# Patient Record
Sex: Female | Born: 1990
Health system: Southern US, Community
[De-identification: ages and names within clinical notes are randomized; demographics above are authoritative.]

## PROBLEM LIST (undated history)

## (undated) DIAGNOSIS — M549 Dorsalgia, unspecified: Secondary | ICD-10-CM

## (undated) DIAGNOSIS — J302 Other seasonal allergic rhinitis: Secondary | ICD-10-CM

## (undated) DIAGNOSIS — G4730 Sleep apnea, unspecified: Secondary | ICD-10-CM

## (undated) DIAGNOSIS — L309 Dermatitis, unspecified: Secondary | ICD-10-CM

## (undated) HISTORY — PX: NO PAST SURGERIES: SHX2092

---
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
## 2011-12-22 ENCOUNTER — Encounter (HOSPITAL_BASED_OUTPATIENT_CLINIC_OR_DEPARTMENT_OTHER): Payer: Self-pay | Admitting: *Deleted

## 2011-12-22 ENCOUNTER — Emergency Department (HOSPITAL_BASED_OUTPATIENT_CLINIC_OR_DEPARTMENT_OTHER)
Admission: EM | Admit: 2011-12-22 | Discharge: 2011-12-22 | Disposition: A | Discharge status: Home / Self Care | Payer: Self-pay | Attending: Emergency Medicine | Admitting: Emergency Medicine

## 2011-12-22 DIAGNOSIS — Z87891 Personal history of nicotine dependence: Secondary | ICD-10-CM | POA: Insufficient documentation

## 2011-12-22 DIAGNOSIS — Z79899 Other long term (current) drug therapy: Secondary | ICD-10-CM | POA: Insufficient documentation

## 2011-12-22 DIAGNOSIS — L03032 Cellulitis of left toe: Secondary | ICD-10-CM

## 2011-12-22 DIAGNOSIS — L03039 Cellulitis of unspecified toe: Secondary | ICD-10-CM | POA: Insufficient documentation

## 2011-12-22 HISTORY — DX: Other seasonal allergic rhinitis: J30.2

## 2011-12-22 HISTORY — DX: Dermatitis, unspecified: L30.9

## 2011-12-22 MED ORDER — LIDOCAINE HCL (PF) 1 % IJ SOLN
INTRAMUSCULAR | Status: AC
  Filled 2011-12-22: qty 5

## 2011-12-22 NOTE — ED Notes (Signed)
Pt has an ingrown toenail on her left great toe. Now c/o pain to same for about 3 weeks.

## 2011-12-22 NOTE — ED Notes (Signed)
I completed wound care per Dr. Lilli Few request. I cleaned off iodine on foot, then applied bacitracin, then wrap with kerlix and coban. Marland Kitchen

## 2011-12-22 NOTE — ED Provider Notes (Signed)
History   This chart was scribed for Charles B. Karle Starch, MD by Kathreen Cornfield, ED Scribe. The patient was seen in room MH08/MH08 and the patient's care was started at 10:24PM.     CSN: [id number]  Arrival date & time 12/22/11  2117   First MD Initiated Contact with Patient 12/22/11 2224      Chief Complaint  Patient presents with  . Toe Pain    (Consider location/radiation/quality/duration/timing/severity/associated sxs/prior treatment) Patient is a 21 y.o. female presenting with toe pain. The history is provided by the patient. No language interpreter was used.  Toe Pain    Anita Hunter is a 21 y.o. female , who presents to the Emergency Department complaining of sudden, progressively worsening, toe pain located at the left great toe, onset three weeks ago. The pt reports she has an ingrown toe nail located at her left great toe. The pt informs she works at [employer], where she stands on her feet for prolonged periods of time, and frequently runs into objects, directly impacting upon her toes. The pt has a hx of eczema and seasonal allergies.   The pt denies any allergies to medications.   The pt does not smoke or drink alcohol.   PCP is Dr. Ernestina Patches.   Past Medical History  Diagnosis Date  . Eczema   . Seasonal allergies     History reviewed. No pertinent past surgical history.  History reviewed. No pertinent family history.  History  Substance Use Topics  . Smoking status: Former Research scientist (life sciences)  . Smokeless tobacco: Not on file  . Alcohol Use: No    OB History    Grav Para Term Preterm Abortions TAB SAB Ect Mult Living                  Review of Systems  All other systems reviewed and are negative.    Allergies  Review of patient's allergies indicates no known allergies.  Home Medications   Current Outpatient Rx  Name  Route  Sig  Dispense  Refill  . CETIRIZINE HCL 10 MG PO TABS   Oral   Take 10 mg by mouth daily.           Marland Kitchen FLUTICASONE PROPIONATE 50  MCG/ACT NA SUSP   Nasal   2 sprays by Nasal route daily. Each nostril.          Marland Kitchen KETOTIFEN FUMARATE 0.025 % OP SOLN   Both Eyes   Place 1 drop into both eyes 2 (two) times daily as needed. For itching          . MOMETASONE FUROATE 0.1 % EX OINT      Apply to affected areas once daily.          . TRIAMCINOLONE ACETONIDE 0.1 % EX OINT      Apply to affected area two times a day as needed.            BP 132/74  Pulse 96  Temp 97.8 F (36.6 C) (Oral)  Resp 16  Ht 5' 8"  (1.727 m)  Wt 290 lb (131.543 kg)  BMI 44.09 kg/m2  SpO2 100%  LMP 11/26/2011  Physical Exam  Nursing note and vitals reviewed. Constitutional: She is oriented to person, place, and time. She appears well-developed and well-nourished.  HENT:  Head: Normocephalic and atraumatic.  Neck: Neck supple.  Pulmonary/Chest: Effort normal.  Musculoskeletal:       Peritrichia detected at the left lateral first toe nailbed.  Neurological: She is alert and oriented to person, place, and time. No cranial nerve deficit.  Psychiatric: She has a normal mood and affect. Her behavior is normal.    ED Course  Procedures (including critical care time)  DIAGNOSTIC STUDIES: Oxygen Saturation is 100% on room air, normal by my interpretation.    COORDINATION OF CARE:    10:26 PM- Treatment plan concerning incision and drainage of left great toe discussed with patient. Pt agrees with treatment.   INCISION AND DRAINAGE Paronychia PROCEDURE NOTE: Patient identification was confirmed and verbal consent was obtained. This procedure was performed by Juanda Crumble B. Karle Starch, MD at 10:50 PM. Site: Left lateral 1st toe.  Anesthetic used (type and amt): lidocaine, digital block Blade size: 11 Drainage: minimal Packing used, none    Labs Reviewed - No data to display No results found.   No diagnosis found.    MDM  Incision of paronychia, no significant ingrown toenail, no need for Abx or toenail  excision     I personally performed the services described in this documentation, which was scribed in my presence. The recorded information has been reviewed and is accurate.     Charles B. Karle Starch, MD 12/22/11 2300

## 2012-06-13 ENCOUNTER — Ambulatory Visit (INDEPENDENT_AMBULATORY_CARE_PROVIDER_SITE_OTHER): Payer: Self-pay | Admitting: Family Medicine

## 2012-06-13 ENCOUNTER — Encounter: Payer: Self-pay | Admitting: Family Medicine

## 2012-06-13 DIAGNOSIS — J309 Allergic rhinitis, unspecified: Secondary | ICD-10-CM

## 2012-06-13 DIAGNOSIS — M25569 Pain in unspecified knee: Secondary | ICD-10-CM

## 2012-06-13 DIAGNOSIS — Z0000 Encounter for general adult medical examination without abnormal findings: Secondary | ICD-10-CM

## 2012-06-13 DIAGNOSIS — L2089 Other atopic dermatitis: Secondary | ICD-10-CM

## 2012-06-13 DIAGNOSIS — F17200 Nicotine dependence, unspecified, uncomplicated: Secondary | ICD-10-CM

## 2012-06-13 DIAGNOSIS — M25562 Pain in left knee: Secondary | ICD-10-CM | POA: Insufficient documentation

## 2012-06-13 MED ORDER — FLUTICASONE PROPIONATE 50 MCG/ACT NA SUSP: 2.0000 | Freq: Every day | NASAL | Status: DC

## 2012-06-13 MED ORDER — CETIRIZINE HCL 10 MG PO TABS: 10.0000 mg | ORAL_TABLET | Freq: Every day | ORAL | Status: DC

## 2012-06-13 MED ORDER — TRIAMCINOLONE ACETONIDE 0.1 % EX OINT: TOPICAL_OINTMENT | Freq: Two times a day (BID) | CUTANEOUS | Status: DC

## 2012-06-13 MED ORDER — MONTELUKAST SODIUM 10 MG PO TABS: 10.0000 mg | ORAL_TABLET | Freq: Every day | ORAL | Status: DC

## 2012-06-13 NOTE — Patient Instructions (Signed)
Thank you for coming in, today! It was nice to meet you. We talked about several things, today. For your knee pain:  Let's try conservative things first.  Try taking Aleve (naprosyn) over the counter, as directed on the package.  You can also try icing your knee (15 minutes on, 15 minutes off, for 1-2 hours at a time).  Look at the drug store to see if there are any knee braces that you can get.  If you're still having trouble in another 2-4 weeks, come back to see me.  We might consider getting some xrays or referring you to sports medicine at that time. For your allergies:  I will write you prescriptions for Flonase (which you have taken before) and a medicine called montelukast (Singulair).  The Flonase is a nasal spray that will help with inflammation.  The Singulair helps keep your body from reacting too strongly to things like pollen and dust.  You can also try adding Zyrtec or Claritin or a similar drug over the counter. For eczema:  You can continue using triamcinolone as needed.  Aveeno or Eucerin are good moisturizers to use.  After a shower, pat your skin dry gently, then put on the triamcinolone, then cover with the moisturizer cream. For weight loss and smoking:  Set specific, small, measurable goals (if you drink 7 sodas in a week, cut down to 5, then 3, then none, etc).  If you eat fast food once per week, try going to once every other week, or quit altogether.  Pick less calorie rich foods (more green leafy vegetables, fewer starchy foods, more water, etc).  1-800-QUIT-NOW is the [place] stop-smoking hotline. They can help with different strategies for stopping.  I will also send a message to one of our "health coaches" who will call you and talk about smoking. If you come back in about a month for your knee, we can talk about birth control and a few other things at that point. If you don't need to come back for your knee, make an appointment in 1-2 months for those  things. Please feel free to call with any questions or concerns at any time, at [phone]. --Dr. Venetia Maxon

## 2012-06-14 ENCOUNTER — Encounter: Payer: Self-pay | Admitting: Family Medicine

## 2012-06-14 DIAGNOSIS — F17200 Nicotine dependence, unspecified, uncomplicated: Secondary | ICD-10-CM | POA: Insufficient documentation

## 2012-06-14 DIAGNOSIS — Z0000 Encounter for general adult medical examination without abnormal findings: Secondary | ICD-10-CM | POA: Insufficient documentation

## 2012-06-14 NOTE — Progress Notes (Signed)
Subjective:    Patient ID: Anita Hunter, female    DOB: [birth year], 22 y.o.   MRN: [id number]  HPI: Pt is a 22yo female who presents to re-establish care/discuss her current issues and to discuss new knee pain. FH, PMH, SH all updated in relevant sections of the EMR.  Knee pain: Pt's left knee began bothering her about 1 month ago when it "snapped back" after being hit by a part of an elliptical machine at the gym that she was getting off of. The pain is mostly unchanged and aching in nature. Pt also has some left great toe tingling/numbness, intermittently. Pt works at [employer] and often stands on concrete floor for long periods. Pt has never been seen by sports medicine; pt's chart indicates this was considered in the past for knee problems. Pt has not tried any medications or icing.  Allergies: Pt has long-standing seasonal allergies. Pt has used several antihistamines in the past without much relief. Pt has also used Flonase in the past with little relief, but more than with antihistamines. Pt complains mostly of sneezing, stuffy nose, and snoring (which she also attributes in part to her weight). Generally denies cough, itchy eyes, runny nose, or SOB. Allergies are "hard to predict," sometimes at random times through the year, sometimes worse in the spring/fall.   Eczema: Pt's eczema flares only "occasionally," mostly on her elbows and knees. She does not think it relates definitely to her allergies or when they are worse. Pt uses triamcinolone and Aveeno after showers, which tends to help. Pt has not noticed any signs/symptoms of infection; specifically denies bleeding/drainage, redness/swelling, etc.  Obesity/weight: Pt reports trying to lose weight and has appropriate questions about healthy body weight and diet choices, etc. PT currently is working out less due to her knee pain, as above. Pt does occasionally eat fast food or "junk food" at home due to her work schedule. However, pt is  working to reduce the number of sodas she drinks, and wants to stop smoking and generally improve her diet.  No prior surgeries. Not currently taking any medications. No known drug allergies.  FH: father with migraines, HTN; maternal and paternal grandmothers with HTN, maternal GM with DM; ?FH of fibroids SH: current some-day smoker ("few cigarettes a week); occasional marijuana use, 1-2 days per week  Works at [employer], as above. Lives apart from family (mother, mother's boyfriend, younger brother, and younger sister; also has a 58yo older sister).  Sexually active in the past with both female and female partners, only one female currently, uses condoms. Interested in OCP's for birth control.  Review of Systems: As above. Otherwise, 12-system ROM was reviewed and all negative.     Objective:   Physical Exam BP 116/61  Pulse 95  Temp(Src) 99 F (37.2 C) (Oral)  Ht 5' 8"  (1.727 m)  Wt 281 lb (127.461 kg)  BMI 42.74 kg/m2 Gen: adult, obese female in NAD, pleasant and cooperative with exam HEENT: [place]/AT, EMO, PERRLA, sclerae and conjunctivae clear; TMs clear bilaterally, MMM  Acanthosis nigricans to posterior neck, no cervical lymph nodes Cardio: RRR, no murmur appreciated Pulm: CTAB, no wheezes; decreased air sounds bilaterally, ?2/2 body habitus but equal air movement and normal WOB Abd: obese, soft, nontender, BS+ MSK: left knee with mild tenderness to palpation along joint line but no crepitus on passive stressed ROM  Knees without frank effusion bilaterally, gait normal normal  ROM to cervical, thoracic, and lumbar spines, and to all four extremities Skin: few  dry, hyperpigmented areas of flexor surfaces of extremities, esp in the antecubital and popliteal fossae  Otherwise warm, dry, intact without rash     Assessment & Plan:

## 2012-06-14 NOTE — Assessment & Plan Note (Signed)
Evidence for mild eczema mostly in antecubital and popliteal fossae without frank superinfection. Rx for triamcinolone cream. Advised general skin care, moisturizers as needed, minimizing harsh soaps/detergents, etc. F/u as needed.

## 2012-06-14 NOTE — Assessment & Plan Note (Signed)
Pt interested in OCP use. Will plan to f/u in approximately 1 month for discussion specifically about birth control and will discuss different options (OCP vs Nexplanon, Mirena, etc). Advised regular condom use for now. Will also discuss routine health maintenance screening in the future, as well, on an age-appropriate basis (pap smear, STI screening, etc).

## 2012-06-14 NOTE — Assessment & Plan Note (Signed)
Current some-day smoker, interested in quitting. Pt states she is willing to set a quit date of 5/30. Counseled on cessation and benefits to various other problems, including weight loss. Provided with 1-800-QUIT-NOW number and will refer to [facility] health coaching. F/u as needed.

## 2012-06-14 NOTE — Assessment & Plan Note (Signed)
A: Little relief in the past from antihistamines, with some improvement with Flonase. No evidence for infectious sinusitis.  P: Rx for montelukast and Flonase. Suggested regular rather than intermittent use of antihistamine in addition to these two, as well. F/u as needed.

## 2012-06-14 NOTE — Assessment & Plan Note (Signed)
A: Likely secondary to minor injury at the gym about one month ago, exacerbated by weight and little directed/specific care. No evidence for joint effusion or fracture on exam.  P: Supportive care with Aleve, icing/rest when able. Suggested OTC brace and gradual return to normal activity/exercise. Will plan to follow up in 2-4 weeks if not improved. May consider plain films and/or referral to sports medicine at that time, as pt has apparently had issues with joint pain in the past. See also obesity problem list note.

## 2012-06-14 NOTE — Assessment & Plan Note (Signed)
Likely contributing to MSK pain as well as "snoring, etc." Discussed general weight-loss strategies and diet improvements. Suggested setting specific, measurable goals such as reducing from 7 sodas/week to 5, then to 3, then none, reducing from 2-3 fast food meals a week to 1, then none, etc. Also advised healthier snack/meal choices, such as less calorie dense foods, more green/leafy vegetables, fewer starches, and so on. Will f/u regularly and consider referral to Dr. Jenne Campus. See also tobacco abuse problem list note.

## 2012-06-16 ENCOUNTER — Encounter: Payer: Self-pay | Admitting: Family Medicine

## 2012-06-16 DIAGNOSIS — Z7189 Other specified counseling: Secondary | ICD-10-CM | POA: Insufficient documentation

## 2012-07-03 NOTE — Progress Notes (Signed)
Spoke with Calla Kicks.  Pt reported that she hasn't smoked since her last office visit.  Pt expressed interest in working with health coach.  Will call back next week and schedule appointment once she has her new work schedule.

## 2014-01-04 ENCOUNTER — Encounter (HOSPITAL_BASED_OUTPATIENT_CLINIC_OR_DEPARTMENT_OTHER): Payer: Self-pay | Admitting: *Deleted

## 2014-01-04 ENCOUNTER — Emergency Department (HOSPITAL_BASED_OUTPATIENT_CLINIC_OR_DEPARTMENT_OTHER)
Admission: EM | Admit: 2014-01-04 | Discharge: 2014-01-04 | Disposition: A | Discharge status: Home / Self Care | Payer: Self-pay | Attending: Emergency Medicine | Admitting: Emergency Medicine

## 2014-01-04 DIAGNOSIS — Z720 Tobacco use: Secondary | ICD-10-CM | POA: Insufficient documentation

## 2014-01-04 DIAGNOSIS — Y998 Other external cause status: Secondary | ICD-10-CM | POA: Insufficient documentation

## 2014-01-04 DIAGNOSIS — Y929 Unspecified place or not applicable: Secondary | ICD-10-CM | POA: Insufficient documentation

## 2014-01-04 DIAGNOSIS — T162XXA Foreign body in left ear, initial encounter: Secondary | ICD-10-CM | POA: Insufficient documentation

## 2014-01-04 DIAGNOSIS — Z7951 Long term (current) use of inhaled steroids: Secondary | ICD-10-CM | POA: Insufficient documentation

## 2014-01-04 DIAGNOSIS — Z79899 Other long term (current) drug therapy: Secondary | ICD-10-CM | POA: Insufficient documentation

## 2014-01-04 DIAGNOSIS — Y939 Activity, unspecified: Secondary | ICD-10-CM | POA: Insufficient documentation

## 2014-01-04 DIAGNOSIS — Z872 Personal history of diseases of the skin and subcutaneous tissue: Secondary | ICD-10-CM | POA: Insufficient documentation

## 2014-01-04 DIAGNOSIS — X58XXXA Exposure to other specified factors, initial encounter: Secondary | ICD-10-CM | POA: Insufficient documentation

## 2014-01-04 NOTE — ED Notes (Signed)
Cotton from a qtip stuck in her left ear.

## 2014-01-04 NOTE — Discharge Instructions (Signed)
Please follow up with your primary care physician in 1-2 days. If you do not have one please call the [facility] number listed above. Please read all discharge instructions and return precautions.    Ear Foreign Body An ear foreign body is an object that is stuck in the ear. It is common for young children to put objects into the ear canal. These may include pebbles, beads, beans, and any other small objects which will fit. In adults, objects such as cotton swabs may become lodged in the ear canal. In all ages, the most common foreign bodies are insects that enter the ear canal.  SYMPTOMS  Foreign bodies may cause pain, buzzing or roaring sounds, hearing loss, and ear drainage.  HOME CARE INSTRUCTIONS   Keep all follow-up appointments with your caregiver as told.  Keep small objects out of reach of young children. Tell them not to put anything in their ears. SEEK IMMEDIATE MEDICAL CARE IF:   You have bleeding from the ear.  You have increased pain or swelling of the ear.  You have reduced hearing.  You have discharge coming from the ear.  You have a fever.  You have a headache. MAKE SURE YOU:   Understand these instructions.  Will watch your condition.  Will get help right away if you are not doing well or get worse. Document Released: 01/27/2000 Document Revised: 04/23/2011 Document Reviewed: 09/17/2007 [facility] Patient Information 2015 [place], [place]. This information is not intended to replace advice given to you by your health care provider. Make sure you discuss any questions you have with your health care provider.

## 2014-01-04 NOTE — ED Provider Notes (Signed)
CSN: [id number]     Arrival date & time 01/04/14  2001 History   First MD Initiated Contact with Patient 01/04/14 2019     No chief complaint on file.    (Consider location/radiation/quality/duration/timing/severity/associated sxs/prior Treatment) HPI Comments: Patient is a 23 yo F presenting to the ED for a Q-tip stuck in her left ear that occurred 1 hour PTA. Denies any pain or discharge from her ear. Endorses "muffled" hearing. She attempted to extract the Q-tip unsuccessfully. No medications tried prior to arrival. Denies any other complaints.    Past Medical History  Diagnosis Date  . Eczema   . Seasonal allergies    History reviewed. No pertinent past surgical history. Family History  Problem Relation Age of Onset  . Hypertension Father   . Migraines Father   . Asthma Brother     No formal diagnosis as of 06/13/2012  . Diabetes Maternal Grandmother   . Hypertension Maternal Grandmother   . Hypertension Paternal Grandmother   . Fibroids Other     Uncertain which family member(s)   History  Substance Use Topics  . Smoking status: Current Some Day Smoker -- 0.25 packs/day    Types: Cigarettes  . Smokeless tobacco: Never Used  . Alcohol Use: No   OB History    No data available     Review of Systems  HENT:       Foreign body ear  All other systems reviewed and are negative.     Allergies  Review of patient's allergies indicates no known allergies.  Home Medications   Prior to Admission medications   Medication Sig Start Date End Date Taking? Authorizing Provider  cetirizine (ZYRTEC) 10 MG tablet Take 1 tablet (10 mg total) by mouth daily. 06/13/12   Sharon Mt Street, MD  fluticasone [facility]) 50 MCG/ACT nasal spray Place 2 sprays into the nose daily. Each nostril. 06/13/12   Sharon Mt Street, MD  ketotifen (ALAWAY) 0.025 % ophthalmic solution Place 1 drop into both eyes 2 (two) times daily as needed. For itching     Historical Provider, MD  montelukast  (SINGULAIR) 10 MG tablet Take 1 tablet (10 mg total) by mouth at bedtime. 06/13/12   [place], MD  triamcinolone ointment (KENALOG) 0.1 % Apply topically 2 (two) times daily. Apply to affected area two times a day as needed. 06/13/12   [place], MD   BP 134/84 mmHg  Pulse 75  Temp(Src) 98.1 F (36.7 C) (Oral)  Resp 20  Ht 5' 8.5" (1.74 m)  Wt 280 lb (127.007 kg)  BMI 41.95 kg/m2  SpO2 96%  LMP 12/26/2013 Physical Exam  Constitutional: She is oriented to person, place, and time. She appears well-developed and well-nourished. No distress.  HENT:  Head: Normocephalic and atraumatic.  Right Ear: Hearing, tympanic membrane, external ear and ear canal normal. No drainage. No mastoid tenderness.  Left Ear: Hearing, tympanic membrane and external ear normal. No drainage. A foreign body (q tip) is present. No mastoid tenderness.  Nose: Nose normal.  Mouth/Throat: Uvula is midline, oropharynx is clear and moist and mucous membranes are normal.  Eyes: Conjunctivae are normal.  Neck: Normal range of motion. Neck supple.  Cardiovascular: Normal rate, regular rhythm and normal heart sounds.   Pulmonary/Chest: Effort normal and breath sounds normal. No respiratory distress.  Abdominal: Soft.  Musculoskeletal: Normal range of motion.  Lymphadenopathy:    She has no cervical adenopathy.  Neurological: She is alert and oriented to person, place,  and time.  Skin: Skin is warm and dry. She is not diaphoretic.  Psychiatric: She has a normal mood and affect.  Nursing note and vitals reviewed.   ED Course  FOREIGN BODY REMOVAL Date/Time: 01/04/2014 8:26 PM Performed by: Harlow Mares Authorized by: Harlow Mares Consent: Verbal consent obtained. Risks and benefits: risks, benefits and alternatives were discussed Consent given by: patient Time out: Immediately prior to procedure a "time out" was called to verify the correct patient, procedure, equipment,  support staff and site/side marked as required. Body area: ear Location details: left ear Patient sedated: no Patient restrained: no Patient cooperative: yes Localization method: visualized Removal mechanism: alligator forceps Complexity: simple 1 objects recovered. Objects recovered: Q-tip Post-procedure assessment: foreign body removed Patient tolerance: Patient tolerated the procedure well with no immediate complications   (including critical care time) Labs Review Labs Reviewed - No data to display  Imaging Review No results found.   EKG Interpretation None      After foreign body removal TM is clear without evidence of trauma or other abnormality. Hearing improved. MDM   Final diagnoses:  Foreign body in ear, left, initial encounter    Filed Vitals:   01/04/14 2006  BP: 134/84  Pulse: 75  Temp: 98.1 F (36.7 C)  Resp: 20   Afebrile, NAD, non-toxic appearing, AAOx4.   Patient with left ear FB. FB successfully removed. No evidence of trauma on re-evaluation. Rest of physical examination is unremarkable. Return precautions discussed. Patient is agreeable to plan. Patient is stable at time of discharge      Gari Crown 01/04/14 2055  Orlie Dakin, MD 01/04/14 2224

## 2014-01-04 NOTE — ED Notes (Signed)
q tip in left ear x 1 hour

## 2014-03-25 ENCOUNTER — Other Ambulatory Visit (HOSPITAL_COMMUNITY)
Admission: RE | Admit: 2014-03-25 | Discharge: 2014-03-25 | Disposition: A | Discharge status: Home / Self Care | Payer: Medicaid Other | Source: Ambulatory Visit | Attending: Family Medicine | Admitting: Family Medicine

## 2014-03-25 ENCOUNTER — Encounter: Payer: Self-pay | Admitting: Family Medicine

## 2014-03-25 ENCOUNTER — Ambulatory Visit (INDEPENDENT_AMBULATORY_CARE_PROVIDER_SITE_OTHER): Payer: Medicaid Other | Admitting: Family Medicine

## 2014-03-25 VITALS — BP 127/86 | HR 89 | Temp 98.2°F | Ht 69.0 in | Wt 308.0 lb

## 2014-03-25 DIAGNOSIS — N898 Other specified noninflammatory disorders of vagina: Secondary | ICD-10-CM

## 2014-03-25 DIAGNOSIS — Z1151 Encounter for screening for human papillomavirus (HPV): Secondary | ICD-10-CM | POA: Diagnosis present

## 2014-03-25 DIAGNOSIS — Z0000 Encounter for general adult medical examination without abnormal findings: Secondary | ICD-10-CM

## 2014-03-25 DIAGNOSIS — L309 Dermatitis, unspecified: Secondary | ICD-10-CM

## 2014-03-25 DIAGNOSIS — Z7189 Other specified counseling: Secondary | ICD-10-CM

## 2014-03-25 DIAGNOSIS — Z3009 Encounter for other general counseling and advice on contraception: Secondary | ICD-10-CM | POA: Diagnosis not present

## 2014-03-25 DIAGNOSIS — Z124 Encounter for screening for malignant neoplasm of cervix: Secondary | ICD-10-CM

## 2014-03-25 DIAGNOSIS — J324 Chronic pansinusitis: Secondary | ICD-10-CM

## 2014-03-25 DIAGNOSIS — Z01419 Encounter for gynecological examination (general) (routine) without abnormal findings: Secondary | ICD-10-CM | POA: Diagnosis not present

## 2014-03-25 DIAGNOSIS — Z30011 Encounter for initial prescription of contraceptive pills: Secondary | ICD-10-CM

## 2014-03-25 DIAGNOSIS — J329 Chronic sinusitis, unspecified: Secondary | ICD-10-CM | POA: Insufficient documentation

## 2014-03-25 MED ORDER — NORETHINDRONE ACET-ETHINYL EST 1.5-30 MG-MCG PO TABS: 1.0000 | ORAL_TABLET | Freq: Every day | ORAL | Status: DC

## 2014-03-25 MED ORDER — TRIAMCINOLONE ACETONIDE 0.1 % EX OINT: TOPICAL_OINTMENT | Freq: Two times a day (BID) | CUTANEOUS | Status: DC

## 2014-03-25 MED ORDER — CETIRIZINE HCL 10 MG PO TABS: 10.0000 mg | ORAL_TABLET | Freq: Every day | ORAL | Status: DC

## 2014-03-25 MED ORDER — MONTELUKAST SODIUM 10 MG PO TABS: 10.0000 mg | ORAL_TABLET | Freq: Every day | ORAL | Status: DC

## 2014-03-25 NOTE — Patient Instructions (Signed)
Thank you for coming in, today!  We talked about several things today. I want you to take a daily Zyrtec (cetirizine) and Singulair (montelukast) for allergies and chronic sinusitis (sinus irritation). I will refer you to the ENT doctor, as well. Someone from our office or theirs will give you a call with an appointment time. I refilled your triamcinolone for eczema and sent in a prescription for birth control pills. Give me a call if you have issues with any of these medications.  Birth control pills will keep you from getting pregnant if you take them properly, but they won't protect against infection. Always use condoms to protect against infection. I will call you or send you a letter with the results from your pap smear in several days.  For your weight, try looking at the website "[email or web address]" It can help with meal planning, portion control, etc. If you would like, I can also refer you to our nutritionist, Dr. Jenne Campus, here in this building. Give me a call if this is something you would be interested in.  Come back to see me as you need. My last day is June 30th of this year. After that, you will have a different doctor here in this same building.  Please feel free to call with any questions or concerns at any time, at [phone]. --Dr. Venetia Maxon

## 2014-03-25 NOTE — Progress Notes (Signed)
Subjective:    Patient ID: Anita Hunter, female    DOB: 04-Dec-1990, 24 y.o.   MRN: [id number]  HPI: Pt presents to clinic for her annual physical exam. She has chronic nasal congestion, stuffiness, snoring, etc but no sinus pain / pressure, rhinorrhea. She has chronic allergies and has not had much help from Singulair or antihistamines. She also has chronic eczema mostly in her antecubital fossae bilaterally, which has been helped with triamcinolone in the past. She is due for a pap smear and Chlamydia screening. She is due for a tetanus shot and flu shot but declines these, today. She is not currently sexually active, with both men and women. Last intercourse was about a month and a half ago. She has no current vaginal or urinary symptoms. Pt endorses marijuana use about once per week and rarely uses tobacco.  Family History  Problem Relation Age of Onset  . Hypertension Father   . Migraines Father   . Asthma Brother     No formal diagnosis as of 06/13/2012  . Diabetes Maternal Grandmother   . Hypertension Maternal Grandmother   . Hypertension Paternal Grandmother   . Fibroids Other     Uncertain which family member(s)    Past Medical History  Diagnosis Date  . Eczema   . Seasonal allergies     No past surgical history on file.  History   Social History  . Marital Status: Single    Spouse Name: N/A  . Number of Children: N/A  . Years of Education: N/A   Occupational History  . Not on file.   Social History Main Topics  . Smoking status: Current Some Day Smoker -- 0.25 packs/day    Types: Cigarettes  . Smokeless tobacco: Never Used  . Alcohol Use: No  . Drug Use: Yes    Special: Marijuana     Comment: Marijauna use, up to two times weekly  . Sexual Activity: Yes    Birth Control/ Protection: Condom     Comment: Female and female partners in the past. As of 06/13/2012, only active with one female partner.   Other Topics Concern  . Not on file   Social History  Narrative   In addition to the above documentation, pt's PMH, surgical history, FH, and SH all reviewed and updated where appropriate in the EMR. I have also reviewed and updated the pt's allergies and current medications as appropriate.  Review of Systems: As above. Otherwise, full 12-system ROS was reviewed and all negative.     Objective:   Physical Exam BP 127/86 mmHg  Pulse 89  Temp(Src) 98.2 F (36.8 C) (Oral)  Ht 5' 9"  (1.753 m)  Wt 308 lb (139.708 kg)  BMI 45.46 kg/m2  LMP 02/24/2014 Gen: well-appearing young adult female in NAD HEENT: Anita Hunter/AT, sclerae/conjunctivae clear, no lid lag, EOMI, PERRLA   MMM, posterior oropharynx clear, no cervical lymphadenopathy  neck supple with full ROM, no masses appreciated; thyroid not enlarged  Cardio: RRR, no murmur appreciated; distal pulses intact/symmetric Pulm: CTAB, no wheezes, normal WOB  Abd: soft, nondistended, BS+, no HSM GU: external vaginal / vulvar structures intact without suspicious lesions  Speculum exam: small amount of thin, whitish discharge present in vaginal vault   cervix difficult to visualize but no definite abnormalities Ext: warm/well-perfused, no cyanosis/clubbing/edema MSK: strength 5/5 in all four extremities, no frank joint deformity/effusion  normal ROM to all four extremities with no point muscle/bony tenderness in spine Neuro/Psych: alert/oriented, sensation grossly intact; normal  gait/balance  mood euthymic with congruent affect     Assessment & Plan:  23yo generally healthy but morbidly obese female with eczema / chronic allergies +/- sinusitis, and mild vaginal discharge - counseled on various forms of birth control, and pt chooses Rx for OCP; strongly recommended condom use to protect against infection - doubt STI but due for Chlamydia screening - referred to ENT per pt request for chronic sinusitis / congestion - refilled Singulair and Zyrtec to help allergy component and triamcinolone for  eczema  Anticipatory guidance / Risk factor reduction - recommended against smoking, especially use of illicit substances - counseled on birth control, safe sex, etc, as above - advised regular yearly f/u for wellness visits, along with acute visits PRN - strongly recommended reduction in weight and improvement in diet / exercise habits - recommended [email or web address] for help with portion control, meal-planning, etc - suggested f/u with Dr. Jenne Campus; pt to call back if she so chooses  Immunization / screening / ancillary studies  - Pap smear performed today, along with GC/Chlaymdia screening - declines shots today; re-offer as appropriate at f/u  Anita Kluver, MD PGY-3, [facility] 03/25/2014, 6:52 PM

## 2014-03-26 LAB — GC/CHLAMYDIA PROBE AMP (~~LOC~~) NOT AT ARMC
Chlamydia: NEGATIVE
Neisseria Gonorrhea: NEGATIVE

## 2014-03-29 ENCOUNTER — Encounter: Payer: Self-pay | Admitting: Family Medicine

## 2014-03-29 LAB — CYTOLOGY - PAP

## 2015-01-26 ENCOUNTER — Emergency Department (HOSPITAL_BASED_OUTPATIENT_CLINIC_OR_DEPARTMENT_OTHER)
Admission: EM | Admit: 2015-01-26 | Discharge: 2015-01-26 | Disposition: A | Discharge status: Home / Self Care | Payer: Medicaid Other | Attending: Emergency Medicine | Admitting: Emergency Medicine

## 2015-01-26 ENCOUNTER — Encounter (HOSPITAL_BASED_OUTPATIENT_CLINIC_OR_DEPARTMENT_OTHER): Payer: Self-pay | Admitting: Emergency Medicine

## 2015-01-26 DIAGNOSIS — F17210 Nicotine dependence, cigarettes, uncomplicated: Secondary | ICD-10-CM | POA: Insufficient documentation

## 2015-01-26 DIAGNOSIS — Z7951 Long term (current) use of inhaled steroids: Secondary | ICD-10-CM | POA: Insufficient documentation

## 2015-01-26 DIAGNOSIS — Z872 Personal history of diseases of the skin and subcutaneous tissue: Secondary | ICD-10-CM | POA: Insufficient documentation

## 2015-01-26 DIAGNOSIS — J069 Acute upper respiratory infection, unspecified: Secondary | ICD-10-CM | POA: Insufficient documentation

## 2015-01-26 DIAGNOSIS — Z79899 Other long term (current) drug therapy: Secondary | ICD-10-CM | POA: Insufficient documentation

## 2015-01-26 LAB — RAPID STREP SCREEN (MED CTR MEBANE ONLY): STREPTOCOCCUS, GROUP A SCREEN (DIRECT): NEGATIVE

## 2015-01-26 NOTE — ED Notes (Signed)
Pt in c/o sinus pressure, sore throat, and generalized sx x 1 week.

## 2015-01-26 NOTE — Discharge Instructions (Signed)
Upper Respiratory Infection, Adult Stable from cigarettes or marijuana . Get saline nasal spray and spray one time into each nostril every 2 hours while awake. Drink at least six 8 ounce glasses of water each day. See your primary care physician if not improving in a week. Return if your condition worsens for any reason. Most upper respiratory infections (URIs) are a viral infection of the air passages leading to the lungs. A URI affects the nose, throat, and upper air passages. The most common type of URI is nasopharyngitis and is typically referred to as "the common cold." URIs run their course and usually go away on their own. Most of the time, a URI does not require medical attention, but sometimes a bacterial infection in the upper airways can follow a viral infection. This is called a secondary infection. Sinus and middle ear infections are common types of secondary upper respiratory infections. Bacterial pneumonia can also complicate a URI. A URI can worsen asthma and chronic obstructive pulmonary disease (COPD). Sometimes, these complications can require emergency medical care and may be life threatening.  CAUSES Almost all URIs are caused by viruses. A virus is a type of germ and can spread from one person to another.  RISKS FACTORS You may be at risk for a URI if:   You smoke.   You have chronic heart or lung disease.  You have a weakened defense (immune) system.   You are very young or very old.   You have nasal allergies or asthma.  You work in crowded or poorly ventilated areas.  You work in health care facilities or schools. SIGNS AND SYMPTOMS  Symptoms typically develop 2-3 days after you come in contact with a cold virus. Most viral URIs last 7-10 days. However, viral URIs from the influenza virus (flu virus) can last 14-18 days and are typically more severe. Symptoms may include:   Runny or stuffy (congested) nose.   Sneezing.   Cough.   Sore throat.    Headache.   Fatigue.   Fever.   Loss of appetite.   Pain in your forehead, behind your eyes, and over your cheekbones (sinus pain).  Muscle aches.  DIAGNOSIS  Your health care provider may diagnose a URI by:  Physical exam.  Tests to check that your symptoms are not due to another condition such as:  Strep throat.  Sinusitis.  Pneumonia.  Asthma. TREATMENT  A URI goes away on its own with time. It cannot be cured with medicines, but medicines may be prescribed or recommended to relieve symptoms. Medicines may help:  Reduce your fever.  Reduce your cough.  Relieve nasal congestion. HOME CARE INSTRUCTIONS   Take medicines only as directed by your health care provider.   Gargle warm saltwater or take cough drops to comfort your throat as directed by your health care provider.  Use a warm mist humidifier or inhale steam from a shower to increase air moisture. This may make it easier to breathe.  Drink enough fluid to keep your urine clear or pale yellow.   Eat soups and other clear broths and maintain good nutrition.   Rest as needed.   Return to work when your temperature has returned to normal or as your health care provider advises. You may need to stay home longer to avoid infecting others. You can also use a face mask and careful hand washing to prevent spread of the virus.  Increase the usage of your inhaler if you have asthma.  Do not use any tobacco products, including cigarettes, chewing tobacco, or electronic cigarettes. If you need help quitting, ask your health care provider. PREVENTION  The best way to protect yourself from getting a cold is to practice good hygiene.   Avoid oral or hand contact with people with cold symptoms.   Wash your hands often if contact occurs.  There is no clear evidence that vitamin C, vitamin E, echinacea, or exercise reduces the chance of developing a cold. However, it is always recommended to get plenty  of rest, exercise, and practice good nutrition.  SEEK MEDICAL CARE IF:   You are getting worse rather than better.   Your symptoms are not controlled by medicine.   You have chills.  You have worsening shortness of breath.  You have brown or red mucus.  You have yellow or brown nasal discharge.  You have pain in your face, especially when you bend forward.  You have a fever.  You have swollen neck glands.  You have pain while swallowing.  You have white areas in the back of your throat. SEEK IMMEDIATE MEDICAL CARE IF:   You have severe or persistent:  Headache.  Ear pain.  Sinus pain.  Chest pain.  You have chronic lung disease and any of the following:  Wheezing.  Prolonged cough.  Coughing up blood.  A change in your usual mucus.  You have a stiff neck.  You have changes in your:  Vision.  Hearing.  Thinking.  Mood. MAKE SURE YOU:   Understand these instructions.  Will watch your condition.  Will get help right away if you are not doing well or get worse.   This information is not intended to replace advice given to you by your health care provider. Make sure you discuss any questions you have with your health care provider.   Document Released: 07/25/2000 Document Revised: 06/15/2014 Document Reviewed: 05/06/2013 Elsevier Interactive Patient Education [employer].

## 2015-01-26 NOTE — ED Provider Notes (Signed)
CSN: [id number]     Arrival date & time 01/26/15  1555 History   First MD Initiated Contact with Patient 01/26/15 1601     No chief complaint on file.    (Consider location/radiation/quality/duration/timing/severity/associated sxs/prior Treatment) HPI Complains of nasal congestion sore throat, pain is worse with swallowing not improve with anything. Pain is mild present. sinus pressure onset 1 week ago. No fever. She's been treating herself with Zyrtec, and NyQuil with transient relief.  other associated symptoms include mild cough. Denies nausea or vomiting denies myalgias No other associated symptoms Past Medical History  Diagnosis Date  . Eczema   . Seasonal allergies    No past surgical history on file. Family History  Problem Relation Age of Onset  . Hypertension Father   . Migraines Father   . Asthma Brother     No formal diagnosis as of 06/13/2012  . Diabetes Maternal Grandmother   . Hypertension Maternal Grandmother   . Hypertension Paternal Grandmother   . Fibroids Other     Uncertain which family member(s)   Social History  Substance Use Topics  . Smoking status: Current Some Day Smoker -- 0.25 packs/day    Types: Cigarettes  . Smokeless tobacco: Never Used  . Alcohol Use: No   quit smoking 1 month ago, no alcohol, quit marijuana 1 month ago no other illicit drug use OB History    No data available     Review of Systems  Constitutional: Negative.   HENT: Positive for congestion, sinus pressure and sore throat.   Respiratory: Positive for cough.   Cardiovascular: Negative.   Gastrointestinal: Negative.   Musculoskeletal: Negative.   Skin: Negative.   Neurological: Negative.   Psychiatric/Behavioral: Negative.   All other systems reviewed and are negative.     Allergies  Review of patient's allergies indicates no known allergies.  Home Medications   Prior to Admission medications   Medication Sig Start Date End Date Taking? Authorizing Provider   cetirizine (ZYRTEC) 10 MG tablet Take 1 tablet (10 mg total) by mouth daily. 03/25/14   Mason, MD  fluticasone [facility]) 50 MCG/ACT nasal spray Place 2 sprays into the nose daily. Each nostril. 06/13/12   Sharon Mt Street, MD  ketotifen (ZADITOR) 0.025 % ophthalmic solution Place 1 drop into both eyes 2 (two) times daily.    Historical Provider, MD  montelukast (SINGULAIR) 10 MG tablet Take 1 tablet (10 mg total) by mouth at bedtime. 03/25/14   [place], MD  Norethindrone Acetate-Ethinyl Estradiol (JUNEL,LOESTRIN,MICROGESTIN) 1.5-30 MG-MCG tablet Take 1 tablet by mouth daily. 03/25/14   [place], MD  triamcinolone ointment (KENALOG) 0.1 % Apply topically 2 (two) times daily. Apply to affected area two times a day as needed. 03/25/14   Sharon Mt Street, MD   There were no vitals taken for this visit. Physical Exam  Constitutional: She appears well-developed and well-nourished.  HENT:  Head: Normocephalic and atraumatic.  Right Ear: External ear normal.  Left Ear: External ear normal.  Nose: Nose normal.  Oropharynx reddened, tonsils slightly enlarged, uvula midline, no exudate or nasal congestion  Eyes: Conjunctivae are normal. Pupils are equal, round, and reactive to light.  Neck: Neck supple. No tracheal deviation present. No thyromegaly present.  Cardiovascular: Regular rhythm.   No murmur heard. Pulse 100 counted by me  Pulmonary/Chest: Effort normal and breath sounds normal.  Abdominal: Soft. Bowel sounds are normal. She exhibits no distension. There is no tenderness.  Morbidly obese  Musculoskeletal:  Normal range of motion. She exhibits no edema or tenderness.  Lymphadenopathy:    She has no cervical adenopathy.  Neurological: She is alert. Coordination normal.  Skin: Skin is warm and dry. No rash noted.  Psychiatric: She has a normal mood and affect.  Nursing note and vitals reviewed.   ED Course  Procedures (including critical care  time) Labs Review Labs Reviewed - No data to display  Imaging Review No results found. I have personally reviewed and evaluated these images and lab results as part of my medical decision-making.   EKG Interpretation None     Results for orders placed or performed during the hospital encounter of 01/26/15  Rapid strep screen (not at [facility])  Result Value Ref Range   Streptococcus, Group A Screen (Direct) NEGATIVE NEGATIVE   No results found.  MDM  Plan encourage oral hydration, saline nasal spray follow up with PMD if not improved in one week Final diagnoses:  None   diagnoses upper respiratory infection      Orlie Dakin, MD 01/26/15 1645

## 2015-01-29 LAB — CULTURE, GROUP A STREP: Strep A Culture: NEGATIVE

## 2015-03-02 ENCOUNTER — Encounter: Payer: Self-pay | Admitting: Family Medicine

## 2015-03-02 ENCOUNTER — Ambulatory Visit (INDEPENDENT_AMBULATORY_CARE_PROVIDER_SITE_OTHER): Payer: Self-pay | Admitting: Family Medicine

## 2015-03-02 DIAGNOSIS — G4730 Sleep apnea, unspecified: Secondary | ICD-10-CM

## 2015-03-02 DIAGNOSIS — R7303 Prediabetes: Secondary | ICD-10-CM | POA: Insufficient documentation

## 2015-03-02 LAB — POCT GLYCOSYLATED HEMOGLOBIN (HGB A1C): HEMOGLOBIN A1C: 6.2

## 2015-03-02 NOTE — Progress Notes (Signed)
Patient here for Sleep Study and Dietician for weight concerns  Patient denies pain at this time.  Patient states she was feeling down over the holidays but has since been in the gym and bringing spirits up.  Patient complains of dry mouth and mucous in the night from sleeping with her mouth open and a fan in front of her.  Patient states nasal spray and zyrtec give no relief so she has cease from using.

## 2015-03-02 NOTE — Assessment & Plan Note (Signed)
A1c 6.2 today. Discussed decreasing simple carbohydrates such as sugars, breads, pastas, cereals, and potatoes. - referral to nutrition.  - f/u 3 months

## 2015-03-02 NOTE — Assessment & Plan Note (Signed)
Weight continues to increase. Current BMI 49. Patient with chronic back pain/spasms most likely secondary to weight. Also concerns for OSA given weight, snoring, and concerns from family/friends about cessation of breathing while she's asleep. Discussed that oftentimes, weight loss can help with this as well. BP slightly elevated at 137/73, untreated sleep apnea could be contributing to this. She also has stigmata of diabetes with acanthosis on exam. - refer for sleep study - discussed weight loss at length specifically eating within the first hour of waking up, appropriate proportions/ratios, caution with liquid calories such as sodas/juices, exercise. - referral to nutritionist, Dr. Jenne Campus  - future order for lipid panel when fasting, advised to make a lab appt, f/u in 3 months.

## 2015-03-02 NOTE — Patient Instructions (Signed)
I have referred you for both a sleep study and the nutritionist Please contact Dr. Jenne Campus about setting up an nutrition appointment. When you're scheduled with her, schedule a lab appointment to have a fasting lipid panel done as well so we can see you cholesterol.  Follow up with me in 3 months.

## 2015-03-02 NOTE — Progress Notes (Signed)
Patient ID: Anita Hunter, female   DOB: [birth year], 25 y.o.   MRN: [id number]    Subjective: CC: sleep study referral.  HPI: Patient is a 25 y.o. female with a past medical history of obesity presenting to clinic today for a referral for back pain and .  Patient would like referral for sleep study: She snores very loudly. She's been told she stops breathing during her sleeping. She doesn't feel like she ever wakes up gasping for air. She sleeps supine (used to sleep on stomach) and she notices her mouth is more dry because she sleeps with her mouth open. She sleeps on 2 pillows. She goest to bed around 11:30pm and wakes up at 6:10am and sometimes feels well rested. She endorses significant daytime sleepiness.   Obesity: Her weight has been progressively increasing. She notes she just started going back to the gym last week  (because she just back on 1st shift). Previously she would go to the gym 2 times per day on MWF and Sunday and she stayed approximately 1.5 to 2 hrs total. In the AM- she'd run for 80mnutes. In the afternoon she'd do 187mutes of cardio, stretching, back exercises (30-45 minutes), and steam room. She freely offers that her eating habits are bad. 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 She cut out some [facility] still drinks a 16 oz bottle 2x/week, and 2 juices, and 1 Lipton ice tea (16oz) per day.   Social History: Never smoker  Health Maintenance: declines flu vaccine  ROS: All other systems reviewed and are negative.  Past Medical History Patient Active Problem List   Diagnosis Date Noted  . Prediabetes 03/02/2015  . Sinusitis, chronic 03/25/2014  . Counseling on substance use and abuse 06/16/2012  . Tobacco abuse 06/14/2012  . Routine adult health maintenance 06/14/2012  . Left knee pain  06/13/2012  . MORBID OBESITY 04/11/2006  . RHINITIS, ALLERGIC 04/11/2006  . Eczema 04/11/2006    Medications- reviewed and updated Current Outpatient Prescriptions  Medication Sig Dispense Refill  . cetirizine (ZYRTEC) 10 MG tablet Take 1 tablet (10 mg total) by mouth daily. (Patient not taking: Reported on 03/02/2015) 30 tablet 3  . fluticasone (FLONASE) 50 MCG/ACT nasal spray Place 2 sprays into the nose daily. Each nostril. (Patient not taking: Reported on 03/02/2015) 16 g 2  . ketotifen (ZADITOR) 0.025 % ophthalmic solution Place 1 drop into both eyes 2 (two) times daily. Reported on 03/02/2015    . montelukast (SINGULAIR) 10 MG tablet Take 1 tablet (10 mg total) by mouth at bedtime. (Patient not taking: Reported on 03/02/2015) 30 tablet 3  . Norethindrone Acetate-Ethinyl Estradiol (JUNEL,LOESTRIN,MICROGESTIN) 1.5-30 MG-MCG tablet Take 1 tablet by mouth daily. (Patient not taking: Reported on 03/02/2015) 1 Package 11  . triamcinolone ointment (KENALOG) 0.1 % Apply topically 2 (two) times daily. Apply to affected area two times a day as needed. (Patient not taking: Reported on 03/02/2015) 30 g 2   No current facility-administered medications for this visit.    Objective: Office vital signs reviewed. BP 136/76 mmHg  Pulse 98  Temp(Src) 98.3 F (36.8 C) (Oral)  Resp 20  Ht 5' 9"  (1.753 m)  Wt 332 lb (150.594 kg)  BMI 49.01 kg/m2  SpO2 98%  LMP 12/28/2014   Physical Examination:  General: Awake, alert obese, NAD  HENNT: oropharynx clear. Grade 3 tonsillar hypertrophy.  No erythema or exudate.  Cardio: RRR, no m/r/g noted. 2+ radial pulses b/l.  Pulm: distant.  No increased WOB.  CTAB, without wheezes, rhonchi or crackles noted.  Skin: Acanthosis nigracans noted circumferentially around around the neck.  Hemoglobin A1c 6.2   Assessment/Plan: MORBID OBESITY Weight continues to increase. Current BMI 49. Patient with chronic back pain/spasms most likely secondary to weight. Also  concerns for OSA given weight, snoring, and concerns from family/friends about cessation of breathing while she's asleep. Discussed that oftentimes, weight loss can help with this as well. BP slightly elevated at 137/73, untreated sleep apnea could be contributing to this. She also has stigmata of diabetes with acanthosis on exam. - refer for sleep study - discussed weight loss at length specifically eating within the first hour of waking up, appropriate proportions/ratios, caution with liquid calories such as sodas/juices, exercise. - referral to nutritionist, Dr. Jenne Campus  - future order for lipid panel when fasting, advised to make a lab appt, f/u in 3 months.  Prediabetes A1c 6.2 today. Discussed decreasing simple carbohydrates such as sugars, breads, pastas, cereals, and potatoes. - referral to nutrition.  - f/u 3 months    Orders Placed This Encounter  Procedures  . Lipid Panel    Standing Status: Future     Number of Occurrences:      Standing Expiration Date: 03/01/2016    Order Specific Question:  Has the patient fasted?    Answer:  Yes  . Amb ref to Medical Nutrition Therapy-MNT    Referral Type:  Consultation    Requested Specialty:  Nutrition    Number of Visits Requested:  1  . POCT glycosylated hemoglobin (Hb A1C)  . Nocturnal polysomnography (NPSG)    Morbid obesity, observations of  "stopping breathing while asleep" from family/friends    Standing Status: Future     Number of Occurrences:      Standing Expiration Date: 03/01/2016    Order Specific Question:  Where should this test be performed:    Answer:  [place]    No orders of the defined types were placed in this encounter.    Archie Patten PGY-2, [place]

## 2015-03-04 ENCOUNTER — Other Ambulatory Visit: Payer: Medicaid Other

## 2015-03-29 ENCOUNTER — Ambulatory Visit (HOSPITAL_BASED_OUTPATIENT_CLINIC_OR_DEPARTMENT_OTHER): Payer: Medicaid Other

## 2015-04-04 ENCOUNTER — Ambulatory Visit (HOSPITAL_BASED_OUTPATIENT_CLINIC_OR_DEPARTMENT_OTHER): Payer: BLUE CROSS/BLUE SHIELD | Attending: Family Medicine | Admitting: Radiology

## 2015-04-04 VITALS — Ht 69.0 in | Wt 335.0 lb

## 2015-04-04 DIAGNOSIS — Z6841 Body Mass Index (BMI) 40.0 and over, adult: Secondary | ICD-10-CM | POA: Insufficient documentation

## 2015-04-04 DIAGNOSIS — G4736 Sleep related hypoventilation in conditions classified elsewhere: Secondary | ICD-10-CM | POA: Diagnosis not present

## 2015-04-04 DIAGNOSIS — G4733 Obstructive sleep apnea (adult) (pediatric): Secondary | ICD-10-CM

## 2015-04-04 DIAGNOSIS — R0683 Snoring: Secondary | ICD-10-CM | POA: Insufficient documentation

## 2015-04-04 DIAGNOSIS — G4730 Sleep apnea, unspecified: Secondary | ICD-10-CM | POA: Diagnosis present

## 2015-04-07 ENCOUNTER — Encounter: Payer: Self-pay | Admitting: Family Medicine

## 2015-04-07 ENCOUNTER — Ambulatory Visit (INDEPENDENT_AMBULATORY_CARE_PROVIDER_SITE_OTHER): Payer: BLUE CROSS/BLUE SHIELD | Admitting: Family Medicine

## 2015-04-07 NOTE — Patient Instructions (Signed)
-   If you feel like the stationary bike doesn't do anything for you, try a High-Intensity Interval Workout, for example:  Try just 4 min of cycling HARD for 20 sec, then easy for 10 sec, and repeat thru 4 min.    Another option is to just throw in some fast intervals (30-60 sec each)  to your usual 20-25 min.   - Make a list of 7-10 meals that taste good, are relatively quick and easy to prepare, and that meet your nutritional needs.  Use this as a basis for shopping, so you can make one of these meals any time.  Bring your list to your follow-up appointment for review.    Goals:  1. Reduce sweet drinks (juice) to 16 oz per week.  Drink at least 48 oz of water/day.     - Try mixing juice with UNsweetened seltzer water.  2. Obtain twice as many veg's as protein or carbohydrate foods for at least 3 meals a week. 3. Exercise at least 30 min 4  X wk.   Complete your Goals Sheet, and bring to follow-up appt.    Follow-up appt is 11 AM on Wed, Apr 5 (NOT 1:30 as your reminder call will say).

## 2015-04-07 NOTE — Progress Notes (Signed)
Medical Nutrition Therapy:  Appt start time: 1330 end time:  1430.  Assessment:  Primary concerns today: Weight management and Blood sugar control. Hgb A1C 6.2 on 03-02-15.  Anita Hunter works full-time at [employer], [employer], 6:30AM - 3:30 PM.  She lives with 2 roommates; sometimes shares meals.  Feb - May 2016 Anita Hunter lost weight by  increasing water intake, regular exercise, and careful food choices.  Then she got a third-shift job, which made everything fall apart.  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   Learning Readiness: Ready  Usual eating pattern includes 2 meals and 0-1 snack per day. Frequent foods and beverages include bkfst of chx biscuit, 10 oz o.j., lunch of frozen meal or deli sandwich, 10 oz juice or punch, fast food.  Avoided foods include seafood (allergy), fish, pork, peas, lima beans, blk-eyed beans, tomatoes, bananas.   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    24-hr recall: (Up at 6 AM) B (8 AM)-   1 bbq chx leg, 3 c cabbage, 1 c mashed potatoes, 1 c mac&chs, water, 16 oz o.j.  Snk ( AM)-   --- L (1:30 PM)-  Turkey&provolone sandw, water, 16 oz o.j.  Snk (2 PM)-  1 blueberry muffin D (10 PM)-  3 c stir-fried chx & veg's, 1 1/2 c rice, water, 16 oz o.j.  Snk ( PM)-  --- Typical day? No. Usually eats less, but she was at her sister's yesterday.  Eats less frequently if home on day off.    Progress Towards Goal(s):  In progress.   Nutritional Diagnosis:  NI-5.8.2 Excessive carbohydrate intake As related to beverages.  As evidenced by usual daily intake of 16-32 oz juice.    Intervention:  Nutrition education.  Handouts given during visit include:  AVS  Goals Sheet  Qs to ask insurance to confirm coverage  Demonstrated degree of understanding via:  Teach Back  Barriers  to learning/adherence to lifestyle change: maintaining commitment to working out.  "I get lazy."    Monitoring/Evaluation:  Dietary intake, exercise, and body weight in 6 week(s).

## 2015-04-09 DIAGNOSIS — G4733 Obstructive sleep apnea (adult) (pediatric): Secondary | ICD-10-CM | POA: Diagnosis not present

## 2015-04-09 NOTE — Progress Notes (Signed)
  Patient Name: Anita Hunter, Anita Hunter Date: 04/04/2015 Gender: Female D.O.B: 19-Jul-1990 Age (years): 24 Referring Provider: Dorcas Mcmurray Height (inches): 5 Interpreting Physician: Baird Lyons MD, ABSM Weight (lbs): 335 RPSGT: Zadie Rhine BMI: 28 MRN: [id number] Neck Size: 19.00 CLINICAL INFORMATION Sleep Study Type: NPSG Indication for sleep study: Morbid Obesity Epworth Sleepiness Score: 9  SLEEP STUDY TECHNIQUE As per the AASM Manual for the Scoring of Sleep and Associated Events v2.3 (April 2016) with a hypopnea requiring 4% desaturations. The channels recorded and monitored were frontal, central and occipital EEG, electrooculogram (EOG), submentalis EMG (chin), nasal and oral airflow, thoracic and abdominal wall motion, anterior tibialis EMG, snore microphone, electrocardiogram, and pulse oximetry.  MEDICATIONS Patient's medications include: charted for review. Medications self-administered by patient during sleep study : No sleep medicine administered.  SLEEP ARCHITECTURE The study was initiated at 9:33:00 PM and ended at 3:30:03 AM. Sleep onset time was 3.5 minutes and the sleep efficiency was 93.1%. The total sleep time was 332.5 minutes. Stage REM latency was 60.0 minutes. The patient spent 15.04% of the night in stage N1 sleep, 60.75% in stage N2 sleep, 0.75% in stage N3 and 23.46% in REM. Alpha intrusion was absent. Supine sleep was 44.06%.  RESPIRATORY PARAMETERS The overall apnea/hypopnea index (AHI) was 126.3 per hour. There were 542 total apneas, including 541 obstructive, 1 central and 0 mixed apneas. There were 158 hypopneas and 10 RERAs. The AHI during Stage REM sleep was 127.7 per hour. AHI while supine was 124.5 per hour. The mean oxygen saturation was 84.63%. The minimum SpO2 during sleep was 67.00%. Loud snoring was noted during this study.  CARDIAC DATA The 2 lead EKG demonstrated sinus rhythm. The mean heart rate was 84.55 beats per minute. Other  EKG findings include: None.  LEG MOVEMENT DATA The total PLMS were 1 with a resulting PLMS index of 0.18. Associated arousal with leg movement index was 0.2 .  IMPRESSIONS - Severe obstructive sleep apnea occurred during this study (AHI = 126.3/h). - No significant central sleep apnea occurred during this study (CAI = 0.2/h). - Severe oxygen desaturation was noted during this study (Min O2 = 67.00%). - The patient snored with Loud snoring volume. - No cardiac abnormalities were noted during this study. - Clinically significant periodic limb movements did not occur during sleep. No significant associated arousals.  DIAGNOSIS - Obstructive Sleep Apnea (327.23 [G47.33 ICD-10]) - Nocturnal Hypoxemia (327.26 [G47.36 ICD-10])  RECOMMENDATIONS - Therapeutic CPAP titration to determine optimal pressure required to alleviate sleep disordered breathing. - Avoid alcohol, sedatives and other CNS depressants that may worsen sleep apnea and disrupt normal sleep architecture. - Sleep hygiene should be reviewed to assess factors that may improve sleep quality. - Weight management and regular exercise should be initiated or continued if appropriate.  Deneise Lever Diplomate, American Board of Sleep Medicine  ELECTRONICALLY SIGNED ON:  04/09/2015, 9:56 AM [place] PH: [phone]   FX: [phone] [place]

## 2015-04-28 ENCOUNTER — Telehealth: Payer: Self-pay | Admitting: Family Medicine

## 2015-04-28 NOTE — Telephone Encounter (Signed)
Please contact patient regarding lab results.

## 2015-04-29 NOTE — Telephone Encounter (Signed)
Spoke with patient, she requesting to speak with PCP about the results of her Sleep Study.

## 2015-05-10 ENCOUNTER — Encounter (HOSPITAL_BASED_OUTPATIENT_CLINIC_OR_DEPARTMENT_OTHER): Payer: Medicaid Other

## 2015-05-11 NOTE — Telephone Encounter (Signed)
Called patient about her sleep study results. Diagnosis is OSA and nocturnal hypoxia.  Patient will need therapeutic CPAP titration to determine optimal pressure > gave patient # for [place] to set up this appt.  Lylith Bebeau [employer]

## 2015-05-18 ENCOUNTER — Ambulatory Visit: Payer: BLUE CROSS/BLUE SHIELD | Admitting: Family Medicine

## 2015-05-25 ENCOUNTER — Encounter (HOSPITAL_BASED_OUTPATIENT_CLINIC_OR_DEPARTMENT_OTHER): Payer: Self-pay

## 2015-05-25 ENCOUNTER — Emergency Department (HOSPITAL_BASED_OUTPATIENT_CLINIC_OR_DEPARTMENT_OTHER)
Admission: EM | Admit: 2015-05-25 | Discharge: 2015-05-25 | Disposition: A | Discharge status: Home / Self Care | Payer: BLUE CROSS/BLUE SHIELD | Attending: Emergency Medicine | Admitting: Emergency Medicine

## 2015-05-25 ENCOUNTER — Telehealth: Payer: Self-pay | Admitting: Family Medicine

## 2015-05-25 DIAGNOSIS — Y998 Other external cause status: Secondary | ICD-10-CM | POA: Insufficient documentation

## 2015-05-25 DIAGNOSIS — Z872 Personal history of diseases of the skin and subcutaneous tissue: Secondary | ICD-10-CM | POA: Insufficient documentation

## 2015-05-25 DIAGNOSIS — Y9389 Activity, other specified: Secondary | ICD-10-CM | POA: Insufficient documentation

## 2015-05-25 DIAGNOSIS — Z8709 Personal history of other diseases of the respiratory system: Secondary | ICD-10-CM | POA: Insufficient documentation

## 2015-05-25 DIAGNOSIS — G4733 Obstructive sleep apnea (adult) (pediatric): Secondary | ICD-10-CM

## 2015-05-25 DIAGNOSIS — Z8669 Personal history of other diseases of the nervous system and sense organs: Secondary | ICD-10-CM | POA: Insufficient documentation

## 2015-05-25 DIAGNOSIS — S3992XA Unspecified injury of lower back, initial encounter: Secondary | ICD-10-CM | POA: Diagnosis present

## 2015-05-25 DIAGNOSIS — X58XXXA Exposure to other specified factors, initial encounter: Secondary | ICD-10-CM | POA: Diagnosis not present

## 2015-05-25 DIAGNOSIS — S39012A Strain of muscle, fascia and tendon of lower back, initial encounter: Secondary | ICD-10-CM

## 2015-05-25 DIAGNOSIS — Y9289 Other specified places as the place of occurrence of the external cause: Secondary | ICD-10-CM | POA: Diagnosis not present

## 2015-05-25 HISTORY — DX: Sleep apnea, unspecified: G47.30

## 2015-05-25 HISTORY — DX: Dorsalgia, unspecified: M54.9

## 2015-05-25 MED ORDER — METHOCARBAMOL 500 MG PO TABS: 1000.0000 mg | ORAL_TABLET | Freq: Four times a day (QID) | ORAL | Status: DC

## 2015-05-25 MED ORDER — NAPROXEN 500 MG PO TABS: 500.0000 mg | ORAL_TABLET | Freq: Two times a day (BID) | ORAL | Status: DC

## 2015-05-25 MED FILL — METHOCARBAMOL 500 MG TABLET: 500 | 4 days supply | Qty: 30 | Fill #0

## 2015-05-25 MED FILL — NAPROXEN 500 MG TABLET: 500 | 7 days supply | Qty: 14 | Fill #0

## 2015-05-25 NOTE — Discharge Instructions (Signed)
Please read and follow all provided instructions.  Your diagnoses today include:  1. Lumbosacral strain, initial encounter     Tests performed today include:  Vital signs - see below for your results today  Medications prescribed:   Robaxin (methocarbamol) - muscle relaxer medication  DO NOT drive or perform any activities that require you to be awake and alert because this medicine can make you drowsy.    Naproxen - anti-inflammatory pain medication  Do not exceed 524m naproxen every 12 hours, take with food  You have been prescribed an anti-inflammatory medication or NSAID. Take with food. Take smallest effective dose for the shortest duration needed for your pain. Stop taking if you experience stomach pain or vomiting.   Take any prescribed medications only as directed.  Home care instructions:   Follow any educational materials contained in this packet  Please rest, use ice or heat on your back for the next several days  Do not lift, push, pull anything more than 10 pounds for the next week  Follow-up instructions: Please follow-up with your primary care provider in the next 1 week for further evaluation of your symptoms.   Return instructions:  SEEK IMMEDIATE MEDICAL ATTENTION IF YOU HAVE:  New numbness, tingling, weakness, or problem with the use of your arms or legs  Severe back pain not relieved with medications  Loss control of your bowels or bladder  Increasing pain in any areas of the body (such as chest or abdominal pain)  Shortness of breath, dizziness, or fainting.   Worsening nausea (feeling sick to your stomach), vomiting, fever, or sweats  Any other emergent concerns regarding your health   Additional Information:  Your vital signs today were: BP 136/92 mmHg   Pulse 102   Temp(Src) 99 F (37.2 C) (Oral)   Resp 18   Ht 5' 9"  (1.753 m)   Wt 154.677 kg   BMI 50.33 kg/m2   SpO2 97%   LMP 04/27/2015 If your blood pressure (BP) was elevated above  135/85 this visit, please have this repeated by your doctor within one month. --------------

## 2015-05-25 NOTE — ED Provider Notes (Signed)
CSN: [id number]     Arrival date & time 05/25/15  1216 History   First MD Initiated Contact with Patient 05/25/15 1224     Chief Complaint  Patient presents with  . Back Pain     (Consider location/radiation/quality/duration/timing/severity/associated sxs/prior Treatment) HPI Comments: Patient with history of back pain presents with complaint of lower back pain intermittently that she has had for years but worse over the past several days. Patient states that the pain is worse when she is standing or walking for long period of time and better when she is sitting. She describes intermittent spasms in her lower back. She has taken Aleve at home without relief. She has tried icy hot in the past without improvement. The pain does not radiate. Patient denies warning symptoms of back pain including: fecal incontinence, urinary retention or overflow incontinence, night sweats, waking from sleep with back pain, unexplained fevers or weight loss, h/o cancer, IVDU, recent trauma.    Patient is a 25 y.o. female presenting with back pain. The history is provided by the patient.  Back Pain Associated symptoms: no dysuria, no fever, no numbness, no pelvic pain and no weakness     Past Medical History  Diagnosis Date  . Eczema   . Seasonal allergies   . Back pain   . Sleep apnea    History reviewed. No pertinent past surgical history. Family History  Problem Relation Age of Onset  . Hypertension Father   . Migraines Father   . Asthma Brother     No formal diagnosis as of 06/13/2012  . Diabetes Maternal Grandmother   . Hypertension Maternal Grandmother   . Hypertension Paternal Grandmother   . Fibroids Other     Uncertain which family member(s)   Social History  Substance Use Topics  . Smoking status: Never Smoker   . Smokeless tobacco: Never Used  . Alcohol Use: No   OB History    No data available     Review of Systems  Constitutional: Negative for fever and unexpected weight change.   Gastrointestinal: Negative for constipation.       Negative for fecal incontinence.   Genitourinary: Negative for dysuria, hematuria, flank pain, vaginal bleeding, vaginal discharge and pelvic pain.       Negative for urinary incontinence or retention.  Musculoskeletal: Positive for back pain.  Neurological: Negative for weakness and numbness.       Denies saddle paresthesias.      Allergies  Shellfish allergy  Home Medications   Prior to Admission medications   Medication Sig Start Date End Date Taking? Authorizing Provider  methocarbamol (ROBAXIN) 500 MG tablet Take 2 tablets (1,000 mg total) by mouth 4 (four) times daily. 05/25/15   Carlisle Cater, PA-C  naproxen (NAPROSYN) 500 MG tablet Take 1 tablet (500 mg total) by mouth 2 (two) times daily. 05/25/15   Carlisle Cater, PA-C   BP 136/92 mmHg  Pulse 102  Temp(Src) 99 F (37.2 C) (Oral)  Resp 18  Ht 5' 9"  (1.753 m)  Wt 154.677 kg  BMI 50.33 kg/m2  SpO2 97%  LMP 04/27/2015 Physical Exam  Constitutional: She appears well-developed and well-nourished.  HENT:  Head: Normocephalic and atraumatic.  Eyes: Conjunctivae are normal.  Neck: Normal range of motion. Neck supple.  Pulmonary/Chest: Effort normal.  Abdominal: Soft. There is no tenderness. There is no CVA tenderness.  Musculoskeletal: Normal range of motion.  No step-off noted with palpation of spine. No reproducible tenderness at time of exam. Normal  gait.  Neurological: She is alert. She has normal strength and normal reflexes. No sensory deficit.  5/5 strength in entire lower extremities bilaterally. No sensation deficit.   Skin: Skin is warm and dry. No rash noted.  Psychiatric: She has a normal mood and affect.  Nursing note and vitals reviewed.   ED Course  Procedures (including critical care time) Labs Review Labs Reviewed - No data to display  Imaging Review No results found. I have personally reviewed and evaluated these images and lab results as part  of my medical decision-making.   EKG Interpretation None       1:00 PM Patient seen and examined. Work-up initiated. Medications ordered.   Vital signs reviewed and are as follows: Filed Vitals:   05/25/15 1224  BP: 136/92  Pulse: 102  Temp: 99 F (37.2 C)  Resp: 18    No red flag s/s of low back pain. Patient was counseled on back pain precautions and told to do activity as tolerated but do not lift, push, or pull heavy objects more than 10 pounds for the next week.  Patient counseled to use ice or heat on back for no longer than 15 minutes every hour.   Patient counseled on proper use of muscle relaxant medication.  They were told not to drink alcohol, drive any vehicle, or do any dangerous activities while taking this medication.  Patient verbalized understanding.  Patient urged to follow-up with PCP if pain does not improve with treatment and rest or if pain becomes recurrent. Urged to return with worsening severe pain, loss of bowel or bladder control, trouble walking.   The patient verbalizes understanding and agrees with the plan.  MDM   Final diagnoses:  Lumbosacral strain, initial encounter   Patient with back pain. No neurological deficits. Patient is ambulatory. No warning symptoms of back pain including: fecal incontinence, urinary retention or overflow incontinence, night sweats, waking from sleep with back pain, unexplained fevers or weight loss, h/o cancer, IVDU, recent trauma. No concern for cauda equina, epidural abscess, or other serious cause of back pain. Conservative measures such as rest, ice/heat and pain medicine indicated with PCP follow-up if no improvement with conservative management.      Carlisle Cater, PA-C 05/25/15 [place], MD 05/25/15 [phone]

## 2015-05-25 NOTE — Telephone Encounter (Signed)
Patient's sister called to say that Anita Hunter called the office for the sleep study and was told that her provider needed to set this up.  She need an order or referral sent in them so she can go and get this done.  Please contact patient or sister when appt have been made.

## 2015-05-25 NOTE — ED Notes (Signed)
PA at bedside.

## 2015-05-25 NOTE — ED Notes (Signed)
C/o mid/lower back pain "for years just worse today"-NAD-steady gait

## 2015-05-25 NOTE — ED Notes (Signed)
Patient has long term low back pain, states it has worsened over past couple days. Patient is on her feet for prolonged periods of time at work. Patient denies any new injury. Patient taking ibuprofen at home without significant relief.

## 2015-05-26 NOTE — Telephone Encounter (Signed)
Sleep study order placed for Dr. Lorenso Courier as I am covering this week. They will call her with the appointment.   CGM MD

## 2015-05-31 ENCOUNTER — Telehealth: Payer: Self-pay | Admitting: Family Medicine

## 2015-05-31 ENCOUNTER — Encounter (HOSPITAL_COMMUNITY): Payer: Self-pay

## 2015-05-31 ENCOUNTER — Emergency Department (HOSPITAL_COMMUNITY)
Admission: EM | Admit: 2015-05-31 | Discharge: 2015-05-31 | Disposition: A | Discharge status: Home / Self Care | Payer: BLUE CROSS/BLUE SHIELD | Attending: Emergency Medicine | Admitting: Emergency Medicine

## 2015-05-31 DIAGNOSIS — M545 Low back pain, unspecified: Secondary | ICD-10-CM

## 2015-05-31 DIAGNOSIS — Z79899 Other long term (current) drug therapy: Secondary | ICD-10-CM | POA: Insufficient documentation

## 2015-05-31 DIAGNOSIS — Z791 Long term (current) use of non-steroidal anti-inflammatories (NSAID): Secondary | ICD-10-CM | POA: Diagnosis not present

## 2015-05-31 DIAGNOSIS — Z8669 Personal history of other diseases of the nervous system and sense organs: Secondary | ICD-10-CM | POA: Diagnosis not present

## 2015-05-31 DIAGNOSIS — M549 Dorsalgia, unspecified: Secondary | ICD-10-CM | POA: Diagnosis present

## 2015-05-31 DIAGNOSIS — Z872 Personal history of diseases of the skin and subcutaneous tissue: Secondary | ICD-10-CM | POA: Insufficient documentation

## 2015-05-31 MED ORDER — CYCLOBENZAPRINE HCL 10 MG PO TABS: 10.0000 mg | ORAL_TABLET | Freq: Two times a day (BID) | ORAL | Status: DC | PRN

## 2015-05-31 MED ORDER — LIDOCAINE 5 % EX PTCH: 1.0000 | MEDICATED_PATCH | CUTANEOUS | Status: DC

## 2015-05-31 MED ORDER — MELOXICAM 7.5 MG PO TABS: 7.5000 mg | ORAL_TABLET | Freq: Every day | ORAL | Status: DC

## 2015-05-31 MED ORDER — MELOXICAM 15 MG PO TABS: 15.0000 mg | ORAL_TABLET | Freq: Every day | ORAL | Status: DC

## 2015-05-31 NOTE — ED Provider Notes (Signed)
CSN: [id number]     Arrival date & time 05/31/15  1244 History   First MD Initiated Contact with Patient 05/31/15 1400     Chief Complaint  Patient presents with  . Back Pain     (Consider location/radiation/quality/duration/timing/severity/associated sxs/prior Treatment) HPI Comments: Patient is a 25 y/o old female with a history of back pain presents with worsening pain over the past 6 days. Patient was seen at [facility] on 4/12 with the same symptoms but states no improvement and the spasms and pain have worsened. She states the pain starts on both sides of her spine and radiates outward but not superior or inferior. Pain is 7-8/10, constant tightness and throbbing when she is having spasms. Sitting makes it better and lifting or prolonged walking makes it worse. She denies trauma, saddle paresthesias, no pain down her buttocks or legs, numbness/tingling. Patient is a Estate manager/land agent at [employer] and states it is difficult to perform her job without lifting.  The history is provided by the patient.    Past Medical History  Diagnosis Date  . Eczema   . Seasonal allergies   . Back pain   . Sleep apnea    History reviewed. No pertinent past surgical history. Family History  Problem Relation Age of Onset  . Hypertension Father   . Migraines Father   . Asthma Brother     No formal diagnosis as of 06/13/2012  . Diabetes Maternal Grandmother   . Hypertension Maternal Grandmother   . Hypertension Paternal Grandmother   . Fibroids Other     Uncertain which family member(s)   Social History  Substance Use Topics  . Smoking status: Never Smoker   . Smokeless tobacco: Never Used  . Alcohol Use: No   OB History    No data available     Review of Systems  Constitutional: Negative for fever and chills.  Respiratory: Negative for chest tightness and shortness of breath.   Musculoskeletal: Positive for back pain. Negative for joint swelling, arthralgias, gait problem and neck pain.   Neurological: Negative for dizziness, syncope, weakness and numbness.  All other systems reviewed and are negative.     Allergies  Shellfish allergy  Home Medications   Prior to Admission medications   Medication Sig Start Date End Date Taking? Authorizing Provider  cyclobenzaprine (FLEXERIL) 10 MG tablet Take 1 tablet (10 mg total) by mouth 2 (two) times daily as needed for muscle spasms. 05/31/15   Kalman Drape, PA  lidocaine (LIDODERM) 5 % Place 1 patch onto the skin daily. Remove & Discard patch within 12 hours or as directed by MD 05/31/15   Kalman Drape, PA  meloxicam (MOBIC) 15 MG tablet Take 1 tablet (15 mg total) by mouth daily. 05/31/15   Kalman Drape, PA  methocarbamol (ROBAXIN) 500 MG tablet Take 2 tablets (1,000 mg total) by mouth 4 (four) times daily. 05/25/15   Carlisle Cater, PA-C  naproxen (NAPROSYN) 500 MG tablet Take 1 tablet (500 mg total) by mouth 2 (two) times daily. 05/25/15   Carlisle Cater, PA-C   BP 173/80 mmHg  Pulse 97  Temp(Src) 98.4 F (36.9 C) (Oral)  Resp 18  Ht 5' 9"  (1.753 m)  Wt 155.584 kg  BMI 50.63 kg/m2  SpO2 100%  LMP 04/26/2015 Physical Exam  Constitutional: She appears well-developed and well-nourished. No distress.  HENT:  Head: Normocephalic and atraumatic.  Eyes: Conjunctivae are normal.  Neck: Normal range of motion.  No tenderness to palpation of  the cervical spine  Pulmonary/Chest: Effort normal.  Musculoskeletal:  No deformities or step off's, poor ROM of spine due to pain, TTP of the bilateral paraspinal muscles of the lumbar region, TTP of the SI joints  Neurological: She is alert. Coordination normal.  Strength 5/5 and sensation intact of BLE  Skin: Skin is warm and dry. No rash noted.  Psychiatric: She has a normal mood and affect. Her behavior is normal.    ED Course  Procedures (including critical care time) Labs Review Labs Reviewed - No data to display  Imaging Review No results found. I have personally  reviewed and evaluated these images and lab results as part of my medical decision-making.   EKG Interpretation None      MDM   Final diagnoses:  Bilateral low back pain without sciatica    Patient with chronic but worsening lower back pain.  No neurological deficits and normal neuro exam.  Patient can walk but states is painful.  No loss of bowel or bladder control.  No concern for cauda equina.  No fever, night sweats, weight loss, h/o cancer, IVDU.  Prescribed Flexeril since pt stated the Robaxin did not help. Pain medicine to include Lidocaine patch and Mobic.   Since back pain is chronic in nature I instructed patient to follow up with her PCP and call Dr. Romana Juniper for Physical Therapy. Discussed ED return precautions and the patient expressed understanding of the discharge instructions. Patient asked for a work note to be off until this Friday 4/21.      Kalman Drape, PA 05/31/15 1623  Julianne Rice, MD 05/31/15 [phone]

## 2015-05-31 NOTE — Discharge Instructions (Signed)
Follow up with your Primary Care provider in 2 days.  Call Dr. Belia Heman for Physical Therapy Referral.  Return to the ED if you experience numbness/tingling of your legs, loose bowel or bladder function, pain down shooting down your legs, weakenss, nausea, vomiting, fever or chills.   Back Pain, Adult Back pain is very common in adults.The cause of back pain is rarely dangerous and the pain often gets better over time.The cause of your back pain may not be known. Some common causes of back pain include:  Strain of the muscles or ligaments supporting the spine.  Wear and tear (degeneration) of the spinal disks.  Arthritis.  Direct injury to the back. For many people, back pain may return. Since back pain is rarely dangerous, most people can learn to manage this condition on their own. HOME CARE INSTRUCTIONS Watch your back pain for any changes. The following actions may help to lessen any discomfort you are feeling:  Remain active. It is stressful on your back to sit or stand in one place for long periods of time. Do not sit, drive, or stand in one place for more than 30 minutes at a time. Take short walks on even surfaces as soon as you are able.Try to increase the length of time you walk each day.  Exercise regularly as directed by your health care provider. Exercise helps your back heal faster. It also helps avoid future injury by keeping your muscles strong and flexible.  Do not stay in bed.Resting more than 1-2 days can delay your recovery.  Pay attention to your body when you bend and lift. The most comfortable positions are those that put less stress on your recovering back. Always use proper lifting techniques, including:  Bending your knees.  Keeping the load close to your body.  Avoiding twisting.  Find a comfortable position to sleep. Use a firm mattress and lie on your side with your knees slightly bent. If you lie on your back, put a pillow under your  knees.  Avoid feeling anxious or stressed.Stress increases muscle tension and can worsen back pain.It is important to recognize when you are anxious or stressed and learn ways to manage it, such as with exercise.  Take medicines only as directed by your health care provider. Over-the-counter medicines to reduce pain and inflammation are often the most helpful.Your health care provider may prescribe muscle relaxant drugs.These medicines help dull your pain so you can more quickly return to your normal activities and healthy exercise.  Apply ice to the injured area:  Put ice in a plastic bag.  Place a towel between your skin and the bag.  Leave the ice on for 20 minutes, 2-3 times a day for the first 2-3 days. After that, ice and heat may be alternated to reduce pain and spasms.  Maintain a healthy weight. Excess weight puts extra stress on your back and makes it difficult to maintain good posture. SEEK MEDICAL CARE IF:  You have pain that is not relieved with rest or medicine.  You have increasing pain going down into the legs or buttocks.  You have pain that does not improve in one week.  You have night pain.  You lose weight.  You have a fever or chills. SEEK IMMEDIATE MEDICAL CARE IF:   You develop new bowel or bladder control problems.  You have unusual weakness or numbness in your arms or legs.  You develop nausea or vomiting.  You develop abdominal pain.  You feel  faint.   This information is not intended to replace advice given to you by your health care provider. Make sure you discuss any questions you have with your health care provider.   Document Released: 01/29/2005 Document Revised: 02/19/2014 Document Reviewed: 06/02/2013 Elsevier Interactive Patient Education [employer].

## 2015-05-31 NOTE — ED Notes (Signed)
Pt seen a week ago for same and told to come back if worsened.  Pt states normal spasm but today has "locked up"

## 2015-05-31 NOTE — Telephone Encounter (Signed)
Waiting for an appt regarding CPAP machine.  Please contact patient regarding this.

## 2015-06-01 NOTE — Telephone Encounter (Signed)
See previous phone message. Sleep center handles their own appointments. Referral had been placed.   CGM MD

## 2015-06-06 ENCOUNTER — Ambulatory Visit (HOSPITAL_BASED_OUTPATIENT_CLINIC_OR_DEPARTMENT_OTHER): Payer: BLUE CROSS/BLUE SHIELD | Attending: Family Medicine | Admitting: Internal Medicine

## 2015-06-06 DIAGNOSIS — G4730 Sleep apnea, unspecified: Secondary | ICD-10-CM | POA: Diagnosis present

## 2015-06-06 DIAGNOSIS — R0683 Snoring: Secondary | ICD-10-CM | POA: Diagnosis not present

## 2015-06-06 DIAGNOSIS — G4733 Obstructive sleep apnea (adult) (pediatric): Secondary | ICD-10-CM | POA: Diagnosis not present

## 2015-06-07 NOTE — Procedures (Deleted)
    NAME: Anita Hunter DATE OF BIRTH:  27-Aug-1990 MEDICAL RECORD NUMBER [id number]  LOCATION: [facility]  PHYSICIAN: YOUNG,CLINTON D  DATE OF STUDY: 06/06/2015  SLEEP STUDY TYPE: Out of Center Sleep Test                REFERRING PHYSICIAN: Lind Covert, *  INDICATION FOR STUDY: ***  EPWORTH SLEEPINESS SCORE:   HEIGHT: 5' 9"  (175.3 cm)  WEIGHT: (!) 338 lb (153.316 kg)    Body mass index is 49.89 kg/(m^2).  NECK SIZE: 19 in.  MEDICATIONS: ***  IMPRESSION:  ***    RECOMMENDATION:  ***   Deneise Lever Diplomate, American Board of Sleep Medicine  ELECTRONICALLY SIGNED ON:  06/07/2015, 1:38 PM [place] PH: [phone]   FX: [phone] [place]

## 2015-06-15 DIAGNOSIS — G4733 Obstructive sleep apnea (adult) (pediatric): Secondary | ICD-10-CM | POA: Diagnosis not present

## 2015-06-15 NOTE — Procedures (Signed)
   Patient Name: Anita Hunter, Anita Hunter Date: 06/06/2015 Gender: Female D.O.B: 1990/12/09 Age (years): 24 Referring Provider: Talbert Cage Height (inches): 69 Interpreting Physician: Baird Lyons MD, ABSM Weight (lbs): 338 RPSGT: Carolin Coy BMI: 50 MRN: [id number] Neck Size: 19.00 CLINICAL INFORMATION The patient is referred for a BiPAP titration to treat sleep apnea.   Date of NPSG, Split Night or HST:  Diagnostic NPSG 04/04/15   AHI 126.3/ hr, desaturation to 67%, body weight 335 lbs  SLEEP STUDY TECHNIQUE As per the AASM Manual for the Scoring of Sleep and Associated Events v2.3 (April 2016) with a hypopnea requiring 4% desaturations. The channels recorded and monitored were frontal, central and occipital EEG, electrooculogram (EOG), submentalis EMG (chin), nasal and oral airflow, thoracic and abdominal wall motion, anterior tibialis EMG, snore microphone, electrocardiogram, and pulse oximetry. Bilevel positive airway pressure (BPAP) was initiated at the beginning of the study and titrated to treat sleep-disordered breathing.  MEDICATIONS Medications taken by the patient : charted for review Medications administered by patient during sleep study : No sleep medicine administered.  RESPIRATORY PARAMETERS Optimal IPAP Pressure (cm): 24 AHI at Optimal Pressure (/hr) 0.0 Optimal EPAP Pressure (cm): 19   Overall Minimal O2 (%): 73.00 Minimal O2 at Optimal Pressure (%): 91.0  SLEEP ARCHITECTURE Start Time: 10:09:02 PM Stop Time: 5:09:37 AM Total Time (min): 420.6 Total Sleep Time (min): 399.7 Sleep Latency (min): 0.4 Sleep Efficiency (%): 95.0 REM Latency (min): 105.0 WASO (min): 20.5 Stage N1 (%): 2.50 Stage N2 (%): 58.72 Stage N3 (%): 15.51 Stage R (%): 23.27 Supine (%): 80.06 Arousal Index (/hr): 32.6      CARDIAC DATA The 2 lead EKG demonstrated sinus rhythm. The mean heart rate was 85.49 beats per minute. Other EKG findings include: None.  LEG MOVEMENT DATA The  total Periodic Limb Movements of Sleep (PLMS) were 3. The PLMS index was 0.45. A PLMS index of <15 is considered normal in adults.  IMPRESSIONS - An optimal BIPAP pressure was selected for this patient ( 24 / 19 cm of water) - CPAP did not adequately control apneas at tolerated pressures. Patient was changed to BIPAP per protocol. - Central sleep apnea was not noted during this titration (CAI = 0.2/h). - Severe oxygen desaturations were observed during this titration (min O2 = 73.00%). - The patient snored with Loud snoring volume. - No cardiac abnormalities were observed during this study. - Clinically significant periodic limb movements were not noted during this study. Arousals associated with PLMs were rare.  DIAGNOSIS - Obstructive Sleep Apnea (327.23 [G47.33 ICD-10])  RECOMMENDATIONS - Trial of BiPAP therapy on 24/19 cm H2O with a Medium size Fisher&Paykel Full Face Mask Simplus mask and heated humidification. - Avoid alcohol, sedatives and other CNS depressants that may worsen sleep apnea and disrupt normal sleep architecture. - Sleep hygiene should be reviewed to assess factors that may improve sleep quality. - Weight management and regular exercise should be initiated or continued.  Deneise Lever Diplomate, American Board of Sleep Medicine  ELECTRONICALLY SIGNED ON:  06/15/2015, 3:16 PM [place] PH: [phone]   FX: [phone] [place]

## 2015-06-23 ENCOUNTER — Telehealth: Payer: Self-pay | Admitting: *Deleted

## 2015-06-23 DIAGNOSIS — G4730 Sleep apnea, unspecified: Secondary | ICD-10-CM

## 2015-06-23 NOTE — Telephone Encounter (Signed)
Patient had CPAP titration study done on 4/24. Needs PCP to write rx for CPAP machine and send to [facility] so she can get this. Patient upset because she thought she would hear from PCP after study was done on 4/24.

## 2015-06-23 NOTE — Telephone Encounter (Signed)
Patient sister now calling, states patient was diagnosed with sleep apnea back in February and it seems like providers have been dragging their feet getting her the help and equipment she needs. States she doesn't understand why provider never called patient after she had sleep study done on 4/24. Patient and sister requesting director give them a call back at [phone]. Sisters name is Engineer, water.

## 2015-06-23 NOTE — Addendum Note (Signed)
Addended by: Archie Patten on: 06/23/2015 12:25 PM   Modules accepted: Orders

## 2015-06-23 NOTE — Telephone Encounter (Signed)
Called patient back. I apologized that no one had gotten back to her about her sleep study- I was (inappropriately) under the impression that the reading physician over at the sleep study site would be contacting her about the study results and sending in the Rx from the findings on 4/24. Additionally noted that the final read from on her study on 4/24 was not done until 5/317. Patient voiced understanding.   I faxed over the Rx to [facility] at [phone]. Gave patient the phone number for [facility] [phone] and let her know that I could not tell her how much the machine would be, once I sent the Rx over they would run her insurance. Patient voiced understanding of the situation. I did not speak with her sister.   Rx per Dr. Janee Morn note: BiPAP therapy on 24/19 cm H2O with a Medium size Fisher&Paykel Full Face Mask Simplus mask and heated humidification.  Archie Patten, MD [facility] Family Medicine Resident  06/23/2015, 12:25 PM

## 2015-06-28 ENCOUNTER — Telehealth: Payer: Self-pay | Admitting: Family Medicine

## 2015-06-28 NOTE — Telephone Encounter (Signed)
All the information was printed out and faxed to [facility].  Thanks, Archie Patten, MD [facility] Family Medicine Resident  06/28/2015, 12:22 PM

## 2015-06-28 NOTE — Telephone Encounter (Signed)
Completed information to [facility] was not sent for patient's CPAP machine.  Please call patient to get more info as to what is needed.

## 2015-07-05 ENCOUNTER — Telehealth: Payer: Self-pay | Admitting: Licensed Clinical Social Worker

## 2015-07-05 NOTE — Telephone Encounter (Signed)
Call to patient to assess needs and assist with obtaining a CPAP machine via American Sleep Apnea Association CPAP Assistance Program.  Patient states she is working and unable to talk will return CSW's call when she leaves work.    CSW will wait for patient to return call  Anita Hunter. [place] Work,  [phone] 4:08 PM

## 2015-07-07 NOTE — Telephone Encounter (Signed)
CSW attempted to call patient again to assess for assistance with getting CPAP.  Unable to leave a message.    Anita Hunter. [place] Work,  [phone] 4:56 PM

## 2015-07-22 NOTE — Telephone Encounter (Signed)
Spoke with Janna Arch at [facility].  Patient had incorrect insurance on file.  Provided with new insurance ([employer])--Patient will need to pay $138.53 initially out-of-pocket, then $46 monthly (enroll in autopay) for 10-12 months or until deductible renews.  Once deductible renews, monthly payment will be $184.  Spoke with sister Aldean Baker) and informed of payment info.  [facility] will call patient to schedule appt to come in for machine

## 2015-08-05 ENCOUNTER — Telehealth: Payer: Self-pay | Admitting: Licensed Clinical Social Worker

## 2015-08-05 NOTE — Telephone Encounter (Signed)
Received call from patient's sister Phineas Real [phone].  Patient works and she is assisting her as patient still has not gotten her CPAP machine.  Per sister, patient is schedule to go to [facility].  CSW also provided phone number and web address for the American Sleep Apnea Association and explained the CPAP assistance program.  Phineas Real will provide information to patient.  Casimer Lanius. LCSW Clinical Social Work, [place]   [phone] 3:17 PM

## 2015-09-28 ENCOUNTER — Emergency Department (HOSPITAL_COMMUNITY)
Admission: EM | Admit: 2015-09-28 | Discharge: 2015-09-28 | Disposition: A | Discharge status: Home / Self Care | Payer: BLUE CROSS/BLUE SHIELD | Attending: Emergency Medicine | Admitting: Emergency Medicine

## 2015-09-28 DIAGNOSIS — M545 Low back pain, unspecified: Secondary | ICD-10-CM

## 2015-09-28 DIAGNOSIS — Z791 Long term (current) use of non-steroidal anti-inflammatories (NSAID): Secondary | ICD-10-CM | POA: Diagnosis not present

## 2015-09-28 DIAGNOSIS — Z79899 Other long term (current) drug therapy: Secondary | ICD-10-CM | POA: Insufficient documentation

## 2015-09-28 DIAGNOSIS — F1290 Cannabis use, unspecified, uncomplicated: Secondary | ICD-10-CM | POA: Diagnosis not present

## 2015-09-28 MED ORDER — METHOCARBAMOL 500 MG PO TABS: 500.0000 mg | ORAL_TABLET | Freq: Two times a day (BID) | ORAL | 0 refills | Status: DC

## 2015-09-28 MED ORDER — TRAMADOL HCL 50 MG PO TABS: 50.0000 mg | ORAL_TABLET | Freq: Two times a day (BID) | ORAL | 0 refills | Status: DC | PRN

## 2015-09-28 MED ORDER — IBUPROFEN 800 MG PO TABS: 800.0000 mg | ORAL_TABLET | Freq: Three times a day (TID) | ORAL | 0 refills | Status: DC

## 2015-09-28 NOTE — ED Triage Notes (Signed)
Patient presents to [facility] today for complaints of what she describes as a back spasm. She localizes pain to her low back and feels like she has some nerve pain as well. She describes the pain as a charlie horse in her back. She has seen her PCP for this problem who recommended exercise and weight loss. She says that she has started eating better but the back pain is preventing her from exercising. She feel sthe pain interferes with her daily activities and living and is resistant to the treatments she has tried at home. She rates the pain as a 9/10. This has been a recurrent problem for her. She is not under the care of a pain clinic. She works at [employer] and feels the pain is related to her heavy lifting.

## 2015-09-28 NOTE — ED Provider Notes (Signed)
[facility] DEPT Provider Note   CSN: [id number] Arrival date & time: 09/28/15  1328  By signing my name below, I, Rayna Sexton, attest that this documentation has been prepared under the direction and in the presence of Delsa Grana, PA-C. Electronically Signed: Rayna Sexton, ED Scribe. 09/28/15. 2:25 PM.   History   Chief Complaint Chief Complaint  Patient presents with  . Back Pain   HPI HPI Comments: Anita Hunter is a 25 y.o. female with a h/o morbid obesity who presents to the Emergency Department complaining of chronic, moderate, non-radiating, diffuse, lower back pain which worsened beginning this morning. Pt describes her back pain as a "knot" and rates it at 9/10 currently. Pt states she has been evaluated by her PCP for her chronic back pain who recommended both exercise and weight loss. She states she has adjusted her diet but cannot exercise due to her recurrent back pain. Pt denies she currently takes medications for her symptoms but has taken naproxen and flexeril in the past w/o long term relief. 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 She denies a PMHx of HTN or a known hx of kidney issues. She denies numbness, tingling, weakness, urinary changes, bowel or bladder incontinence or other associated symptoms at this time.   The history is provided by the patient. No language interpreter was used.    Past Medical History:  Diagnosis Date  . Back pain   . Eczema   . Seasonal allergies   . Sleep apnea     Patient Active Problem List   Diagnosis Date Noted  . Prediabetes 03/02/2015  . Sinusitis, chronic 03/25/2014  . Counseling on substance use and abuse 06/16/2012  . Tobacco abuse 06/14/2012  . Routine adult health maintenance 06/14/2012  . Left knee pain 06/13/2012  . MORBID OBESITY 04/11/2006  . RHINITIS, ALLERGIC  04/11/2006  . Eczema 04/11/2006    No past surgical history on file.  OB History    No data available       Home Medications    Prior to Admission medications   Medication Sig Start Date End Date Taking? Authorizing Provider  cyclobenzaprine (FLEXERIL) 10 MG tablet Take 1 tablet (10 mg total) by mouth 2 (two) times daily as needed for muscle spasms. 05/31/15   Kalman Drape, PA  ibuprofen (ADVIL,MOTRIN) 800 MG tablet Take 1 tablet (800 mg total) by mouth 3 (three) times daily. 09/28/15   Delsa Grana, PA-C  lidocaine (LIDODERM) 5 % Place 1 patch onto the skin daily. Remove & Discard patch within 12 hours or as directed by MD 05/31/15   Kalman Drape, PA  meloxicam (MOBIC) 15 MG tablet Take 1 tablet (15 mg total) by mouth daily. 05/31/15   Kalman Drape, PA  methocarbamol (ROBAXIN) 500 MG tablet Take 1 tablet (500 mg total) by mouth 2 (two) times daily. 09/28/15   Delsa Grana, PA-C  naproxen (NAPROSYN) 500 MG tablet Take 1 tablet (500 mg total) by mouth 2 (two) times daily. 05/25/15   Carlisle Cater, PA-C  traMADol (ULTRAM) 50 MG tablet Take 1 tablet (50 mg total) by mouth every 12 (twelve) hours as needed for severe pain. 09/28/15   Delsa Grana, PA-C    Family History Family History  Problem Relation Age of Onset  . Hypertension Father   . Migraines Father   .  Asthma Brother     No formal diagnosis as of 06/13/2012  . Diabetes Maternal Grandmother   . Hypertension Maternal Grandmother   . Hypertension Paternal Grandmother   . Fibroids Other     Uncertain which family member(s)    Social History Social History  Substance Use Topics  . Smoking status: Never Smoker  . Smokeless tobacco: Never Used  . Alcohol use No     Allergies   Shellfish allergy   Review of Systems Review of Systems  Constitutional: Negative for fever.  Genitourinary: Negative for decreased urine volume, difficulty urinating and dysuria.  Musculoskeletal: Positive for back pain and myalgias.  Skin:  Negative for color change and wound.  Neurological: Negative for weakness and numbness.  All other systems reviewed and are negative.  Physical Exam Updated Vital Signs BP 137/81 (BP Location: Right Arm)   Pulse 96   Temp 98.5 F (36.9 C) (Oral)   Resp 18   Ht 5' 9"  (1.753 m)   Wt (!) 340 lb (154.2 kg)   SpO2 99%   BMI 50.21 kg/m   Physical Exam  Constitutional: She is oriented to person, place, and time. She appears well-developed and well-nourished. No distress.  HENT:  Head: Normocephalic and atraumatic.  Right Ear: External ear normal.  Left Ear: External ear normal.  Nose: Nose normal.  Eyes: Conjunctivae and EOM are normal. Pupils are equal, round, and reactive to light. Right eye exhibits no discharge. Left eye exhibits no discharge. No scleral icterus.  Neck: Normal range of motion. Neck supple. No tracheal deviation present.  Cardiovascular: Normal rate, regular rhythm, normal heart sounds and intact distal pulses.   Pulmonary/Chest: Effort normal and breath sounds normal. No stridor. No respiratory distress.  Abdominal: Soft. Bowel sounds are normal. She exhibits no distension. There is no tenderness.  No CVA tenderness  Musculoskeletal: Normal range of motion. She exhibits no edema or tenderness.  Nml ROM. No TTP to the C, T or L-spine or paraspinal muscles. Pt ambulatory with a steady gait.   Neurological: She is alert and oriented to person, place, and time. She exhibits normal muscle tone. Coordination normal.  Normal sensation to light touch 5/5 strength with dorsiflexion/plantarflexion Normal gait  Skin: Skin is warm and dry. Capillary refill takes less than 2 seconds. No rash noted. She is not diaphoretic. No erythema. No pallor.  Psychiatric: She has a normal mood and affect. Her behavior is normal. Judgment and thought content normal.  Nursing note and vitals reviewed.  ED Treatments / Results  Labs (all labs ordered are listed, but only abnormal results  are displayed) Labs Reviewed - No data to display  EKG  EKG Interpretation None       Radiology No results found.  Procedures Procedures  DIAGNOSTIC STUDIES: Oxygen Saturation is 99% on RA, normal by my interpretation.    COORDINATION OF CARE: 2:21 PM Discussed next steps with pt. Pt verbalized understanding and is agreeable with the plan.    Medications Ordered in ED Medications - No data to display   Initial Impression / Assessment and Plan / ED Course  I have reviewed the triage vital signs and the nursing notes.  Pertinent labs & imaging results that were available during my care of the patient were reviewed by me and considered in my medical decision making (see chart for details).  Clinical Course    Patient with back pain.  No neurological deficits and normal neuro exam.  Patient is ambulatory.  No  loss of bowel or bladder control.  No concern for cauda equina.  No fever, night sweats, weight loss, h/o cancer, IVDA, no recent procedure to back. No urinary symptoms suggestive of UTI.  Supportive care and return precaution discussed. Appears safe for discharge at this time. Follow up as indicated in discharge paperwork.    I personally performed the services described in this documentation, which was scribed in my presence. The recorded information has been reviewed and is accurate.    Final Clinical Impressions(s) / ED Diagnoses   Final diagnoses:  Bilateral low back pain without sciatica    New Prescriptions Discharge Medication List as of 09/28/2015  2:31 PM    START taking these medications   Details  ibuprofen (ADVIL,MOTRIN) 800 MG tablet Take 1 tablet (800 mg total) by mouth 3 (three) times daily., Starting Wed 09/28/2015, Print    traMADol (ULTRAM) 50 MG tablet Take 1 tablet (50 mg total) by mouth every 12 (twelve) hours as needed for severe pain., Starting Wed 09/28/2015, Print         Delsa Grana, PA-C 09/28/15 2130    Daleen Bo, MD 09/29/15  2120

## 2016-03-13 ENCOUNTER — Emergency Department (HOSPITAL_BASED_OUTPATIENT_CLINIC_OR_DEPARTMENT_OTHER)
Admission: EM | Admit: 2016-03-13 | Discharge: 2016-03-13 | Disposition: A | Discharge status: Home / Self Care | Payer: BLUE CROSS/BLUE SHIELD | Attending: Emergency Medicine | Admitting: Emergency Medicine

## 2016-03-13 ENCOUNTER — Encounter (HOSPITAL_BASED_OUTPATIENT_CLINIC_OR_DEPARTMENT_OTHER): Payer: Self-pay | Admitting: *Deleted

## 2016-03-13 DIAGNOSIS — R590 Localized enlarged lymph nodes: Secondary | ICD-10-CM | POA: Insufficient documentation

## 2016-03-13 DIAGNOSIS — R229 Localized swelling, mass and lump, unspecified: Secondary | ICD-10-CM | POA: Diagnosis present

## 2016-03-13 DIAGNOSIS — R599 Enlarged lymph nodes, unspecified: Secondary | ICD-10-CM

## 2016-03-13 NOTE — ED Triage Notes (Signed)
Boil to her left axilla for a couple of months. Worse after she shaves.

## 2016-03-13 NOTE — Discharge Instructions (Signed)
This is likely an enlarged lymph node. Please use Motrin for pain and swelling. If you notice any redness, drainage, swelling of the area please return to the ED. You need to follow up with her primary care doctor if it is not improving the next few days for further workup. If you develop any fevers, chills, worsening pain please return to the ED or follow-up with her primary care doctor. May use a warm compress under your arm to help with swelling.

## 2016-03-13 NOTE — ED Provider Notes (Signed)
[place] DEPT [facility] Provider Note   CSN: [id number] Arrival date & time: 03/13/16  1443  By signing my name below, I, Ryan Long, attest that this documentation has been prepared under the direction and in the presence of [employer], PA-C. Electronically Signed: Vergia Alcon, Scribe. 03/13/2016. 4:14 PM.   History   Chief Complaint Chief Complaint  Patient presents with  . Abscess   The history is provided by the patient. No language interpreter was used.    HPI Comments: Anita Hunter is a 26 y.o. female who presents to the Emergency Department complaining of intermittent swelling and knot under the left axilla onset a couple months ago. The area has resolved and returns periodically. She reports shaving today causing her swelling to return. She notes shaving exacerbates the swelling and her pain to the area is exacerbated direct pressure. Pt reports no alleviating factors of her symptoms. Pt denies fever, drainage from the area, nausea, vomiting, redness, night sweats, hx of cancer, hx of ivdu, abd pain, urinary symptoms, breast nodules, open wounds, erythema and any other associated symptoms at this time.   Past Medical History:  Diagnosis Date  . Back pain   . Eczema   . Seasonal allergies   . Sleep apnea    Patient Active Problem List   Diagnosis Date Noted  . Prediabetes 03/02/2015  . Sinusitis, chronic 03/25/2014  . Counseling on substance use and abuse 06/16/2012  . Tobacco abuse 06/14/2012  . Routine adult health maintenance 06/14/2012  . Left knee pain 06/13/2012  . MORBID OBESITY 04/11/2006  . RHINITIS, ALLERGIC 04/11/2006  . Eczema 04/11/2006   History reviewed. No pertinent surgical history.  OB History    No data available       Home Medications    Prior to Admission medications   Medication Sig Start Date End Date Taking? Authorizing Provider  cyclobenzaprine (FLEXERIL) 10 MG tablet Take 1 tablet (10 mg total) by mouth 2 (two) times  daily as needed for muscle spasms. 05/31/15   Kalman Drape, PA  ibuprofen (ADVIL,MOTRIN) 800 MG tablet Take 1 tablet (800 mg total) by mouth 3 (three) times daily. 09/28/15   Delsa Grana, PA-C  lidocaine (LIDODERM) 5 % Place 1 patch onto the skin daily. Remove & Discard patch within 12 hours or as directed by MD 05/31/15   Kalman Drape, PA  meloxicam (MOBIC) 15 MG tablet Take 1 tablet (15 mg total) by mouth daily. 05/31/15   Kalman Drape, PA  methocarbamol (ROBAXIN) 500 MG tablet Take 1 tablet (500 mg total) by mouth 2 (two) times daily. 09/28/15   Delsa Grana, PA-C  naproxen (NAPROSYN) 500 MG tablet Take 1 tablet (500 mg total) by mouth 2 (two) times daily. 05/25/15   Carlisle Cater, PA-C  traMADol (ULTRAM) 50 MG tablet Take 1 tablet (50 mg total) by mouth every 12 (twelve) hours as needed for severe pain. 09/28/15   Delsa Grana, PA-C   Family History Family History  Problem Relation Age of Onset  . Hypertension Father   . Migraines Father   . Asthma Brother     No formal diagnosis as of 06/13/2012  . Diabetes Maternal Grandmother   . Hypertension Maternal Grandmother   . Hypertension Paternal Grandmother   . Fibroids Other     Uncertain which family member(s)   Social History Social History  Substance Use Topics  . Smoking status: Never Smoker  . Smokeless tobacco: Never Used  . Alcohol use No  Allergies   Shellfish allergy  Review of Systems Review of Systems  Constitutional: Negative for chills and fever.  HENT: Negative for congestion.   Respiratory: Negative for cough and shortness of breath.   Cardiovascular: Negative for chest pain.  Gastrointestinal: Negative for nausea and vomiting.  Skin: Positive for wound. Negative for color change.  Neurological: Negative for dizziness, syncope, weakness, light-headedness and headaches.  All other systems reviewed and are negative.  Physical Exam Updated Vital Signs BP 140/87   Pulse 100   Temp 98.2 F (36.8 C) (Oral)    Resp 18   Ht 5' 9"  (1.753 m)   Wt (!) 154.2 kg   LMP 02/14/2016   SpO2 98%   BMI 50.21 kg/m   Physical Exam  Constitutional: She is oriented to person, place, and time. She appears well-developed and well-nourished.  Morbid obesity.  HENT:  Head: Normocephalic.  Eyes: Conjunctivae are normal.  Neck: Normal range of motion. Neck supple.  No cervical lymphadenopathy appreciated.  Cardiovascular: Normal rate.   Patient mallet tachycardic on my exam. Heart rate was 101.  Pulmonary/Chest: Effort normal.  ctab  Abdominal: She exhibits no distension.  Musculoskeletal: Normal range of motion.  Neurological: She is alert and oriented to person, place, and time.  Skin: Skin is warm and dry. Capillary refill takes less than 2 seconds.  1 x 1 cm firm, tender, mobile lymph node noted to the left axilla. No surrounding erythema, edema, drainage, open wound noted. Single lymph node noted. No supraclavicular lymph nodes appreciated.  Psychiatric: She has a normal mood and affect.  Nursing note and vitals reviewed.  ED Treatments / Results  DIAGNOSTIC STUDIES:  Oxygen Saturation is 97% on RA, normal by my interpretation.    COORDINATION OF CARE:  4:06 PM Discussed treatment plan with pt at bedside including [place] of left axilla, Abx, and f/u w/ PCP, and pt agreed to plan.  Labs (all labs ordered are listed, but only abnormal results are displayed) Labs Reviewed - No data to display  EKG  EKG Interpretation None       Radiology No results found.  Procedures Procedures (including critical care time)  Medications Ordered in ED Medications - No data to display   Initial Impression / Assessment and Plan / ED Course  I have reviewed the triage vital signs and the nursing notes.  Pertinent labs & imaging results that were available during my care of the patient were reviewed by me and considered in my medical decision making (see chart for details).     Patient presents with  enlarged lymph node to the left axilla. Lymph node is firm, movable, tender to palpation. No overlying cellulitis is noted. Ultrasound was used to myself at bedside that reveals no drainable abscess. Given patient's tenderness to palpation of lymph node likely reactive possibly from shaving. No open wound was noted though. No further lymphadenopathy appreciated including cervical, supraclavicular. Patient is afebrile. Patient is tachycardic however on review of prior visits patient heart rate is usually elevated. Patient denies any nodules to her breast on breast exam. I do not feel this this is a drainable abscess given there is no surrounding areas of cellulitis. Patient has been encouraged to follow up with her PCP next week for symptoms are not improving for further workup. She's been encouraged to return to ED if she develops any redness, fevers, draineage, nausea, vomiting or for any other reason. Patient is agreeable to the above plan. Pt was dicussed with Dr.  Linker who is agreeable to the above plan. Pt is hemodynamically stable, in NAD, & able to ambulate in the ED. Pain has been managed & has no complaints prior to dc. Pt is comfortable with above plan and is stable for discharge at this time. All questions were answered prior to disposition. Strict return precautions for f/u to the ED were discussed.   Final Clinical Impressions(s) / ED Diagnoses   Final diagnoses:  Enlarged lymph node    New Prescriptions New Prescriptions   No medications on file   I personally performed the services described in this documentation, which was scribed in my presence. The recorded information has been reviewed and is accurate.     Doristine Devoid, PA-C 03/13/16 1650    Doristine Devoid, PA-C 03/13/16 [place], MD 03/13/16 [phone]

## 2016-03-13 NOTE — ED Notes (Signed)
ED Provider at bedside. 

## 2016-06-24 ENCOUNTER — Emergency Department (HOSPITAL_COMMUNITY)
Admission: EM | Admit: 2016-06-24 | Discharge: 2016-06-24 | Disposition: A | Discharge status: Home / Self Care | Payer: BLUE CROSS/BLUE SHIELD | Attending: Emergency Medicine | Admitting: Emergency Medicine

## 2016-06-24 ENCOUNTER — Encounter (HOSPITAL_COMMUNITY): Payer: Self-pay | Admitting: Emergency Medicine

## 2016-06-24 ENCOUNTER — Emergency Department (HOSPITAL_COMMUNITY): Payer: BLUE CROSS/BLUE SHIELD

## 2016-06-24 DIAGNOSIS — M545 Low back pain, unspecified: Secondary | ICD-10-CM

## 2016-06-24 LAB — POC URINE PREG, ED: Preg Test, Ur: NEGATIVE

## 2016-06-24 MED ORDER — METHOCARBAMOL 500 MG PO TABS: 500.0000 mg | ORAL_TABLET | Freq: Two times a day (BID) | ORAL | 0 refills | Status: DC

## 2016-06-24 MED ORDER — LIDOCAINE 5 % EX PTCH: 1.0000 | MEDICATED_PATCH | CUTANEOUS | 0 refills | Status: DC

## 2016-06-24 MED ORDER — CYCLOBENZAPRINE HCL 10 MG PO TABS
10.0000 mg | ORAL_TABLET | Freq: Once | ORAL | Status: AC
  Administered 2016-06-24: 10 mg via ORAL
  Filled 2016-06-24: qty 1

## 2016-06-24 MED ORDER — OXYCODONE-ACETAMINOPHEN 5-325 MG PO TABS
1.0000 | ORAL_TABLET | Freq: Once | ORAL | Status: AC
  Administered 2016-06-24: 1 via ORAL
  Filled 2016-06-24: qty 1

## 2016-06-24 MED ORDER — DICLOFENAC SODIUM 50 MG PO TBEC: 50.0000 mg | DELAYED_RELEASE_TABLET | Freq: Two times a day (BID) | ORAL | 0 refills | Status: DC

## 2016-06-24 MED ORDER — ONDANSETRON 4 MG PO TBDP
4.0000 mg | ORAL_TABLET | Freq: Once | ORAL | Status: AC
  Administered 2016-06-24: 4 mg via ORAL
  Filled 2016-06-24: qty 1

## 2016-06-24 NOTE — ED Notes (Signed)
Patient transported to X-ray 

## 2016-06-24 NOTE — ED Notes (Signed)
Pt has neg preg test.

## 2016-06-24 NOTE — Discharge Instructions (Signed)
Do not take the muscle relaxant if driving as it will make you sleepy. Follow up with DR. Percell Miller if symptoms persist. Return here as needed.

## 2016-06-24 NOTE — ED Triage Notes (Signed)
Pt c/o B/L knee pain ongoing for some time and her back locked up twice today. Pt has not taken any medication for pain.

## 2016-06-24 NOTE — ED Notes (Signed)
Talked to pt about seeing PCP for HPTN.

## 2016-06-24 NOTE — ED Provider Notes (Signed)
[place] DEPT Provider Note   CSN: [id number] Arrival date & time: 06/24/16  1817  By signing my name below, I, [name], attest that this documentation has been prepared under the direction and in the presence of non-physician practitioner, Debroah Baller, NP. Electronically Signed: [name], Scribe. 06/24/2016. 8:13 PM.  History   Chief Complaint Chief Complaint  Patient presents with  . Back Pain  . Knee Pain   The history is provided by the patient. No language interpreter was used.  Back Pain   This is a new problem. The current episode started 3 to 5 hours ago. The problem occurs constantly. The problem has not changed since onset.The pain is associated with a recent injury. The pain is present in the lumbar spine and thoracic spine. The pain does not radiate. The pain is moderate. The symptoms are aggravated by twisting and bending. Pertinent negatives include no fever, no numbness, no abdominal pain, no bowel incontinence, no bladder incontinence, no dysuria and no weakness. She has tried nothing for the symptoms.  Knee Pain   This is a recurrent problem. The current episode started more than 1 week ago. The problem occurs constantly. The problem has been gradually worsening. Pertinent negatives include no numbness.   HPI Comments: Anita Hunter is a 26 y.o. female who presents to the Emergency Department complaining of constant moderate generalized back pain that started a few hours ago. She states her lower back "locked up". Her pain is exacerbated by movement. No treatment PTA. Her current pain is different from prior episodes of undiagnosed pain, which were unresolved by Naproxen.   She also complains of constant moderate bilateral knee pain that started a few weeks ago. Her pain worsened over the past few days. No known knee injury. She admits to a prior hx of knee pain. Denies any chance of pregnancy, bladder/bowel incontinence, or other complaints at this  time.    PCP: Archie Patten, MD  Past Medical History:  Diagnosis Date  . Back pain   . Eczema   . Seasonal allergies   . Sleep apnea     Patient Active Problem List   Diagnosis Date Noted  . Prediabetes 03/02/2015  . Sinusitis, chronic 03/25/2014  . Counseling on substance use and abuse 06/16/2012  . Tobacco abuse 06/14/2012  . Routine adult health maintenance 06/14/2012  . Left knee pain 06/13/2012  . MORBID OBESITY 04/11/2006  . RHINITIS, ALLERGIC 04/11/2006  . Eczema 04/11/2006    History reviewed. No pertinent surgical history.  OB History    No data available       Home Medications    Prior to Admission medications   Medication Sig Start Date End Date Taking? Authorizing Provider  diclofenac (VOLTAREN) 50 MG EC tablet Take 1 tablet (50 mg total) by mouth 2 (two) times daily. 06/24/16   Ashley Murrain, NP  lidocaine (LIDODERM) 5 % Place 1 patch onto the skin daily. Remove & Discard patch within 12 hours or as directed by MD 06/24/16   Ashley Murrain, NP  methocarbamol (ROBAXIN) 500 MG tablet Take 1 tablet (500 mg total) by mouth 2 (two) times daily. 06/24/16   Ashley Murrain, NP    Family History Family History  Problem Relation Age of Onset  . Hypertension Father   . Migraines Father   . Asthma Brother        No formal diagnosis as of 06/13/2012  . Diabetes Maternal Grandmother   . Hypertension Maternal  Grandmother   . Hypertension Paternal Grandmother   . Fibroids Other        Uncertain which family member(s)    Social History Social History  Substance Use Topics  . Smoking status: Never Smoker  . Smokeless tobacco: Never Used  . Alcohol use No     Allergies   Shellfish allergy   Review of Systems Review of Systems  Constitutional: Negative for fever.  Gastrointestinal: Negative for abdominal pain and bowel incontinence.  Genitourinary: Negative for bladder incontinence, dysuria, frequency and urgency.  Musculoskeletal: Positive for back  pain and myalgias (Bilateral knee).  Skin: Negative for wound.  Neurological: Negative for weakness and numbness.  Psychiatric/Behavioral: The patient is not nervous/anxious.      Physical Exam Updated Vital Signs BP (!) 151/102   Pulse 100   Temp 98.6 F (37 C)   Resp 20   Ht 5' 9"  (1.753 m)   Wt (!) 345 lb (156.5 kg)   LMP 05/25/2016   SpO2 99%   BMI 50.95 kg/m   Physical Exam  Constitutional: No distress.  Morbidly obese.   HENT:  Head: Normocephalic and atraumatic.  Nose: Nose normal.  Mouth/Throat: Uvula is midline, oropharynx is clear and moist and mucous membranes are normal.  Eyes: Conjunctivae and EOM are normal.  Neck: Trachea normal and normal range of motion. Neck supple. No spinous process tenderness present.  Cardiovascular: Normal rate and regular rhythm.   Pulmonary/Chest: Effort normal. She has no wheezes. She has no rales.  Abdominal: Soft. Bowel sounds are normal. There is no tenderness.  Musculoskeletal: Normal range of motion.       Lumbar back: She exhibits tenderness, pain and spasm. She exhibits normal pulse.       Back:  Neurological: She is alert. She has normal strength. No sensory deficit. Gait normal.  Reflex Scores:      Bicep reflexes are 2+ on the right side and 2+ on the left side.      Brachioradialis reflexes are 2+ on the right side and 2+ on the left side.      Patellar reflexes are 2+ on the right side and 2+ on the left side. Straight leg raises without difficulty. Ambulatory with steady gait, no foot drag.   Skin: Skin is warm and dry.  Psychiatric: She has a normal mood and affect. Her behavior is normal.  Nursing note and vitals reviewed.    ED Treatments / Results  DIAGNOSTIC STUDIES: Oxygen Saturation is 99% on RA, normal by my interpretation.    COORDINATION OF CARE: 8:17 PM- Pt advised of plan for treatment and pt agrees.  Labs (all labs ordered are listed, but only abnormal results are displayed) Labs Reviewed   POC URINE PREG, ED  Radiology Dg Lumbar Spine Complete  Result Date: 06/24/2016 CLINICAL DATA:  Initial evaluation for chronic back pain, worsened today. No injury. EXAM: LUMBAR SPINE - COMPLETE 4+ VIEW COMPARISON:  None. FINDINGS: There is no evidence of lumbar spine fracture. Alignment is normal. Intervertebral disc spaces are maintained. IMPRESSION: 1. No radiographic evidence for acute abnormality within the lumbar spine. 2. No significant degenerative disc disease identified. Electronically Signed   By: Jeannine Boga M.D.   On: 06/24/2016 21:57    Procedures Procedures (including critical care time)  Medications Ordered in ED Medications  oxyCODONE-acetaminophen (PERCOCET/ROXICET) 5-325 MG per tablet 1 tablet (1 tablet Oral Given 06/24/16 2034)  ondansetron (ZOFRAN-ODT) disintegrating tablet 4 mg (4 mg Oral Given 06/24/16 2034)  cyclobenzaprine (  FLEXERIL) tablet 10 mg (10 mg Oral Given 06/24/16 2034)   Patient's pain improved with medication given in the ED.   Initial Impression / Assessment and Plan / ED Course  I have reviewed the triage vital signs and the nursing notes.  Pertinent imaging results that were available during my care of the patient were reviewed by me and considered in my medical decision making (see chart for details).  Patient with back pain.  No neurological deficits and normal neuro exam.  Patient can walk but states is painful.  No loss of bowel or bladder control.  No concern for cauda equina.  No fever, night sweats, weight loss, h/o cancer, IVDU.  RICE protocol and pain medicine indicated and discussed with patient.   Final Clinical Impressions(s) / ED Diagnoses   Final diagnoses:  Bilateral low back pain without sciatica, unspecified chronicity    New Prescriptions New Prescriptions   DICLOFENAC (VOLTAREN) 50 MG EC TABLET    Take 1 tablet (50 mg total) by mouth 2 (two) times daily.   LIDOCAINE (LIDODERM) 5 %    Place 1 patch onto the skin daily.  Remove & Discard patch within 12 hours or as directed by MD   METHOCARBAMOL (ROBAXIN) 500 MG TABLET    Take 1 tablet (500 mg total) by mouth 2 (two) times daily.   I personally performed the services described in this documentation, which was scribed in my presence. The recorded information has been reviewed and is accurate.     Debroah Baller [place], [place] 06/24/16 2251    Lacretia Leigh, MD 06/24/16 [phone]

## 2016-06-24 NOTE — ED Notes (Signed)
Pt verbalized understanding discharge instructions and denies any further needs or questions at this time. VS stable, ambulatory and steady gait.   

## 2016-06-27 ENCOUNTER — Encounter: Payer: Self-pay | Admitting: Family Medicine

## 2016-06-27 ENCOUNTER — Ambulatory Visit (INDEPENDENT_AMBULATORY_CARE_PROVIDER_SITE_OTHER): Payer: BLUE CROSS/BLUE SHIELD | Admitting: Family Medicine

## 2016-06-27 DIAGNOSIS — M25562 Pain in left knee: Secondary | ICD-10-CM | POA: Diagnosis not present

## 2016-06-27 DIAGNOSIS — M549 Dorsalgia, unspecified: Secondary | ICD-10-CM | POA: Insufficient documentation

## 2016-06-27 NOTE — Assessment & Plan Note (Signed)
No red flags on exam or history. Suspect muscle spasms.  Patient would ultimately benefit from core strengthening and weight loss. Disc other patient be very beneficial for her. She received prescriptions for Voltaren tablets and Robaxin in the ED, however has not picked these up. I feel both would be beneficial. Note written for work to limit restrictions on weight lifting. Patient to follow-up in 2 months or sooner as needed.

## 2016-06-27 NOTE — Assessment & Plan Note (Signed)
Patient notes bilateral knee pain worse on the left than the right.  Mild tenderness at the tibial tuberosity. I suspect this is secondary to overuse and weight. -Continue Voltaren as needed. -Rest and ice with elevation -Weight loss will ultimately be very beneficial -If no improvement, patient should follow-up with [place] or orthopedics.

## 2016-06-27 NOTE — Patient Instructions (Addendum)
I have written a note for work.  PT is going to be the best thing for your back pain.  Do the home exercises for the plantar fasciitis.  Your knee pain should improve with rest, ice, NSAIDS like Voltaren, and ultimately weight loss. If you note it is not improved, mention it to Dr. Percell Miller.  Your blood pressure was borderline high, follow up in 3 months for back pain and blood pressure check.

## 2016-06-27 NOTE — Progress Notes (Signed)
Subjective: [id number] back pain HPI: Patient is a 26 y.o. female with a past medical history of morbid obesity presenting to clinic today for back pain and knee pain.  Back pain: Patient notes intermittently having lower back spasms.  She was trying to get off the couch on Sunday and her back locked up on her. She went to the emergency room at that time. No more locking up since she got a muscle relaxer from the ED. Notes some pain on lifting, even though she tries to looked with her knees. Some pain with bending over, but then it seems to improve.  No sciatic pain, numbness or tingling.  No urinary or bowl incontinence.  She saw Raliegh Ip today who recommended physical therapy. She will follow up with them.  Knee pain: Present since she was younger.  Notes it is inferior to the patella. Dull ache that is present intermittently. She feels like bending down to pick things up and walking makes it worse Will intermittently give out.  No locking.  No instability of the knees.  Hasn't tried anything for the pain.  Social History: works at [employer]: All other systems reviewed and are negative.  Past Medical History Patient Active Problem List   Diagnosis Date Noted  . Back pain 06/27/2016  . Prediabetes 03/02/2015  . Sinusitis, chronic 03/25/2014  . Counseling on substance use and abuse 06/16/2012  . Tobacco abuse 06/14/2012  . Routine adult health maintenance 06/14/2012  . Left knee pain 06/13/2012  . MORBID OBESITY 04/11/2006  . RHINITIS, ALLERGIC 04/11/2006  . Eczema 04/11/2006    Medications- reviewed and updated Current Outpatient Prescriptions  Medication Sig Dispense Refill  . diclofenac (VOLTAREN) 50 MG EC tablet Take 1 tablet (50 mg total) by mouth 2 (two) times daily. 15 tablet 0  . lidocaine (LIDODERM) 5 % Place 1 patch onto the skin daily. Remove & Discard patch within 12 hours or as directed by MD 30 patch 0  . methocarbamol (ROBAXIN) 500 MG  tablet Take 1 tablet (500 mg total) by mouth 2 (two) times daily. 20 tablet 0   No current facility-administered medications for this visit.     Objective: Office vital signs reviewed. BP 140/80 (BP Location: Left Wrist, Patient Position: Sitting, Cuff Size: Normal)   Pulse (!) 101   Temp 98.3 F (36.8 C) (Oral)   Ht 5' 9"  (1.753 m)   Wt (!) 357 lb 3.2 oz (162 kg)   SpO2 96%   BMI 52.75 kg/m    Physical Examination:  General: Awake, alert, well- nourished, NAD Back: tenderness to palpation in the lumbar region bilaterally. No tenderness over the spinous processes. Limited flexion and extension. Negative SLR. 5/5 strength in the LE bilaterally. Faint patellar DTRs bilaterally  Knees: Normal to inspection without erythema, ecchymoses, effusion or obvious bony abnormalities.  No obvious Baker's cysts Palpation normal with no warmth or joint line tenderness or patellar tenderness.  No TTP along infrapatellar or pes anserine bursas.  Mild tenderness at the tibial tuberosity.  ROM normal in flexion (135 degrees) and extension (0 degrees) and lower leg rotation. Ligaments with solid consistent endpoints including ACL, PCL, LCL, MCL.  Negative Anterior Drawer. Negative Thessaly Non painful patellar compression.  Normal Patellar glide.  No apprehension  Patellar and quadriceps tendons unremarkable. Hamstring and quadriceps strength is normal.  Assessment/Plan: Back pain No red flags on exam or history. Suspect muscle spasms.  Patient would ultimately benefit from core strengthening and weight loss.  Disc other patient be very beneficial for her. She received prescriptions for Voltaren tablets and Robaxin in the ED, however has not picked these up. I feel both would be beneficial. Note written for work to limit restrictions on weight lifting. Patient to follow-up in 2 months or sooner as needed.  Left knee pain Patient notes bilateral knee pain worse on the left than the right.  Mild  tenderness at the tibial tuberosity. I suspect this is secondary to overuse and weight. -Continue Voltaren as needed. -Rest and ice with elevation -Weight loss will ultimately be very beneficial -If no improvement, patient should follow-up with [place] or orthopedics.   No orders of the defined types were placed in this encounter.   No orders of the defined types were placed in this encounter.   Archie Patten PGY-3, [place]

## 2016-07-17 ENCOUNTER — Ambulatory Visit (INDEPENDENT_AMBULATORY_CARE_PROVIDER_SITE_OTHER): Payer: BLUE CROSS/BLUE SHIELD | Admitting: Family Medicine

## 2016-07-17 ENCOUNTER — Telehealth: Payer: Self-pay | Admitting: Family Medicine

## 2016-07-17 ENCOUNTER — Encounter: Payer: Self-pay | Admitting: Family Medicine

## 2016-07-17 DIAGNOSIS — M549 Dorsalgia, unspecified: Secondary | ICD-10-CM

## 2016-07-17 DIAGNOSIS — S39012D Strain of muscle, fascia and tendon of lower back, subsequent encounter: Secondary | ICD-10-CM | POA: Insufficient documentation

## 2016-07-17 NOTE — Telephone Encounter (Signed)
Spoke with patient, placed in Dr. Alease Frame continuity clinic this afternoon. FYI to PCP and Dr. Alease Frame

## 2016-07-17 NOTE — Telephone Encounter (Signed)
Paperwork completed.  Patient needs to sign the form a couple times  (starting on page 6 until page 10)  Please have patient come by and have this signed. Forms have been left up front behind the Check-In Desk, next to the filing basket and binder.  Once signed please fax these completed forms.  Thank you!

## 2016-07-17 NOTE — Telephone Encounter (Signed)
I am post call tomorrow and have no openings the subsequent day. Please try to find space on someone's continuity schedule- this is not an appropriate same day appt.  Thanks, Archie Patten, MD [facility] Resident  07/17/2016, 11:53 AM\

## 2016-07-17 NOTE — Assessment & Plan Note (Addendum)
Patient is here for follow-up on her low back pain. Etiology likely lumbar muscle strain and deconditioning. Patient endorsed persistent lumbar back pain of 6-8 on a scale of 10. No red flags symptoms noted. No radiation of pain. Physical exam was relatively unremarkable with some slight TTP along the lumbar musculature, and suspected tight hamstring musculature reducing flexibility with forward flexion. SLR negative. - FMLA paperwork to be filled out today. 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  - we discussed and agreed upon an extension of 2 more weeks from work. - I strongly encouraged PT >> patient states this has already been ordered by ortho - Ice. - Daily low velocity activity. - F/u PRN

## 2016-07-17 NOTE — Progress Notes (Signed)
   HPI  CC: Back pain Patient is here for follow-up on her lumbar back pain. She states that she suffered an acute exacerbation in muscular low back pain midway through May. Since that time she has been out of work. Patient works as a Educational psychologist at [employer].  Patient states that the pain is still present but slightly improved since the last visit. Pain is located along the low back bilaterally. Pain does not radiate. Pain is described as sharp and aching. Pain is worse with quick movements. Patient states that none of the medications she has been provided provide much help. She denies any bowel/bladder incontinence. No numbness, weakness, or paresthesias. No fevers, or chills.  Patient is here for FMLA paperwork to be filled out.  Review of Systems See HPI for ROS.   CC, SH/smoking status, and VS noted  Objective: BP 136/88   Pulse 99   Temp 98.4 F (36.9 C) (Oral)   Ht 5' 9"  (1.753 m)   Wt (!) 357 lb 12.8 oz (162.3 kg)   SpO2 95%   BMI 52.84 kg/m  Gen: NAD, alert, cooperative. CV: Well-perfused. Resp: Non-labored. Neuro: Sensation intact throughout. Back Exam: No evidence of swelling, erythema, ecchymosis, or bony deformity. No TTP along the spinous processes. Mild TTP along the paraspinal musculature of the lumbar spine bilaterally. ROM seems relatively intact with low velocity twisting (bilat), side bends (bilat), and forward flexion. SLR unremarkable bilaterally. Hamstring flexibility reduced bilaterally. No sensory changes appreciated. DTRs +1 throughout. Strength 5/5 in all major muscle groups bilaterally. Gait relatively unchanged with bilateral external rotation of the legs.   Assessment and plan:  Back pain Patient is here for follow-up on her low back pain. Etiology likely lumbar muscle strain and deconditioning. Patient endorsed persistent lumbar back pain of 6-8 on a scale of 10. No red flags symptoms noted. No radiation of pain. Physical exam was relatively  unremarkable with some slight TTP along the lumbar musculature, and suspected tight hamstring musculature reducing flexibility with forward flexion. SLR negative. - FMLA paperwork to be filled out today. 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  - we discussed and agreed upon an extension of 2 more weeks from work. - I strongly encouraged PT >> patient states this has already been ordered by ortho - Ice. - Daily low velocity activity. - F/u PRN   Elberta Leatherwood, MD,MS,  PGY3 07/17/2016 3:38 PM

## 2016-07-17 NOTE — Telephone Encounter (Signed)
I received paperwork for applying to disability for the patient. She needs to come in for an additional evaluation and to sign the HIPAA release information. Additionally, she may benefit from getting information from [employer].  Thanks, Archie Patten, MD [facility] Family [facility] Resident  07/17/2016, 8:36 AM

## 2016-07-17 NOTE — Telephone Encounter (Signed)
Spoke with patient and informed her of message from MD, she states forms have to be sent in by 6/7 and wanted to know if MD could see her by then or if she can be seen by someone else.

## 2016-07-18 NOTE — Telephone Encounter (Signed)
Pt came to sign papers. Per cheryl, they were given to hariett to be faxed.Brnadon Eoff Kennon Holter, CMA

## 2016-07-26 ENCOUNTER — Telehealth: Payer: Self-pay | Admitting: Family Medicine

## 2016-07-26 NOTE — Telephone Encounter (Signed)
ROI in chart, notes faxed to number provided.

## 2016-07-26 NOTE — Telephone Encounter (Signed)
Anita Hunter called from [place] and is requesting the patient offices not for her last 2 visit here so that they can continue with her disability. Can we fax these to [phone] and put the patient name and employer id on them .ID [id number].jw

## 2016-08-22 ENCOUNTER — Telehealth: Payer: Self-pay | Admitting: Family Medicine

## 2016-08-22 NOTE — Telephone Encounter (Signed)
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 They ask about her medical conditions and she did tell them she was diagnosed with sleep apnea , but she never could afford the machine. She just needs the doctor to write a letter stating that she would still be okay to drive,. jw

## 2016-08-22 NOTE — Telephone Encounter (Signed)
Patient needs to schedule an appointment to formally be seen by me. Thanks. -- Harriet Butte, Malcolm, PGY-2

## 2016-08-22 NOTE — Telephone Encounter (Signed)
Will forward to MD to advise. Heston Widener,CMA  

## 2016-08-24 NOTE — Telephone Encounter (Signed)
Pt was advised. ep

## 2016-08-27 ENCOUNTER — Ambulatory Visit (INDEPENDENT_AMBULATORY_CARE_PROVIDER_SITE_OTHER): Payer: BLUE CROSS/BLUE SHIELD | Admitting: Internal Medicine

## 2016-08-27 ENCOUNTER — Encounter: Payer: Self-pay | Admitting: Internal Medicine

## 2016-08-27 DIAGNOSIS — G4733 Obstructive sleep apnea (adult) (pediatric): Secondary | ICD-10-CM | POA: Insufficient documentation

## 2016-08-27 NOTE — Assessment & Plan Note (Addendum)
Has obstructive sleep apnea per sleep study done in April 2017 - recommendation to be on BiPAP, however does not have the ability to pay for this currently. 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

## 2016-08-27 NOTE — Progress Notes (Signed)
   [facility] Kerrin Mo, MD Phone: [phone]  Reason For Visit:  Letter for CDL training   # Patient with recent diagnosis of the sleep apnea last year in 2017. She states that she can afford a CPAP machine. She is planning to perform CDL training and was told by her boss that she needs to be seen for review of her CPAP. Patient was seen by sleep medicine in April 2017 and was diagnosed with obstructive sleep apnea, requiring she had severe oxygen desaturations noted during her sleep. Review of his notes patient was supposed to try BiPAP. However she cannot afford BiPAP and therefore has not been using any type of machine to help with sleep.  Patient's Epworth score on sleepiness scale was higher than normal sleepiness, however not excess excessive sleepiness  Past Medical History Reviewed problem list.  Medications- reviewed and updated No additions to family history Objective: BP 122/88   Pulse 91   Temp 98.3 F (36.8 C) (Oral)   Wt (!) 352 lb (159.7 kg)   LMP 08/27/2016   SpO2 98%   BMI 51.98 kg/m  Gen: NAD, alert, cooperative with exam Cardio: regular rate and rhythm, S1S2 heard, no murmurs appreciated Pulm: clear to auscultation bilaterally, no wheezes, rhonchi or rales Skin: dry, intact, no rashes or lesions   Assessment/Plan: See problem based a/p  Obstructive sleep apnea Has obstructive sleep apnea per sleep study done in April 2017 - recommendation to be on BiPAP, however does not have the ability to pay for this currently. 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

## 2016-08-29 ENCOUNTER — Encounter: Payer: Self-pay | Admitting: *Deleted

## 2016-08-29 NOTE — Telephone Encounter (Signed)
This encounter was created in error - please disregard.

## 2016-09-03 ENCOUNTER — Telehealth: Payer: Self-pay | Admitting: Family Medicine

## 2016-09-03 NOTE — Telephone Encounter (Signed)
Pt is calling to see if [facility] had sent papers to be filled out for her and if so have this been done. Also if there is anything she needs to be doing let her know.jw

## 2016-09-04 NOTE — [facility] Notes (Signed)
Social work consult from [employer] requesting assistance with papers received from [facility]) for American Sleep Apnea Association referral .    LCSW call Sonia Baller [phone] ext. [phone] with [facility] reference the above consult.  Sonia Baller reviewed previous conversations she has with patient and explained assistance needed from LCSW.  Phone call to patient to explain cost of American Sleep Apnea Association program and cost for payment plan with [facility].  Patient has selected the payment plan with [facility].  One time cost for set up of $138.53 and $50.00 per month for 10 months.  Patient requested to have the set-up on her Aug. 9th which is her payday. Informed patient [facility] would call to set up the appointment.  Patient appreciative of the call.  Call Sonia Baller with [facility] to provide an update.  Update provided to [place] RN and Dr. Yisroel Ramming.  Plan: [facility] will contact patient to schedule the set-up appointment on or as close to the date requested.   Casimer Lanius, LCSW Licensed Clinical Social Worker [place]   [phone] 10:17 AM

## 2016-09-06 ENCOUNTER — Other Ambulatory Visit: Payer: Self-pay | Admitting: Family Medicine

## 2016-09-06 DIAGNOSIS — G4733 Obstructive sleep apnea (adult) (pediatric): Secondary | ICD-10-CM

## 2016-09-07 NOTE — Progress Notes (Signed)
Anita Hunter with [name] contacted LCSW requesting assistance with obtaining new orders with setting for patient's BiPAP machine.  Current orders are from 2017. New order placed by PCP.  Information faxed to [place].  LCSW called Anita Hunter to confirm receipt of information.  Casimer Lanius, LCSW Licensed Clinical Social Worker [place]   [phone] 10:56 AM

## 2016-09-15 ENCOUNTER — Emergency Department (HOSPITAL_COMMUNITY): Payer: BLUE CROSS/BLUE SHIELD

## 2016-09-15 ENCOUNTER — Encounter (HOSPITAL_COMMUNITY): Payer: Self-pay

## 2016-09-15 ENCOUNTER — Emergency Department (HOSPITAL_COMMUNITY)
Admission: EM | Admit: 2016-09-15 | Discharge: 2016-09-15 | Disposition: A | Discharge status: Home / Self Care | Payer: BLUE CROSS/BLUE SHIELD | Attending: Emergency Medicine | Admitting: Emergency Medicine

## 2016-09-15 DIAGNOSIS — Y92512 Supermarket, store or market as the place of occurrence of the external cause: Secondary | ICD-10-CM | POA: Insufficient documentation

## 2016-09-15 DIAGNOSIS — Z79899 Other long term (current) drug therapy: Secondary | ICD-10-CM | POA: Insufficient documentation

## 2016-09-15 DIAGNOSIS — R51 Headache: Secondary | ICD-10-CM | POA: Diagnosis not present

## 2016-09-15 DIAGNOSIS — M25562 Pain in left knee: Secondary | ICD-10-CM | POA: Insufficient documentation

## 2016-09-15 DIAGNOSIS — W010XXA Fall on same level from slipping, tripping and stumbling without subsequent striking against object, initial encounter: Secondary | ICD-10-CM | POA: Diagnosis not present

## 2016-09-15 DIAGNOSIS — Y990 Civilian activity done for income or pay: Secondary | ICD-10-CM | POA: Insufficient documentation

## 2016-09-15 DIAGNOSIS — Y939 Activity, unspecified: Secondary | ICD-10-CM | POA: Insufficient documentation

## 2016-09-15 DIAGNOSIS — W19XXXA Unspecified fall, initial encounter: Secondary | ICD-10-CM

## 2016-09-15 MED ORDER — NAPROXEN 250 MG PO TABS
500.0000 mg | ORAL_TABLET | Freq: Once | ORAL | Status: AC
  Administered 2016-09-15: 500 mg via ORAL
  Filled 2016-09-15: qty 2

## 2016-09-15 NOTE — ED Notes (Signed)
No deformities noted at this time

## 2016-09-15 NOTE — ED Triage Notes (Signed)
Pt states she fell at work over a palate yestarday around 445 am and landed on her hand and left leg. Pt states her knee feels like it swelling some and has been having a headache since this morning. Pt denise any LOC. Pt is axox4 at this time.

## 2016-09-15 NOTE — ED Provider Notes (Signed)
[facility] DEPT Provider Note   CSN: [id number] Arrival date & time: 09/15/16  0505     History   Chief Complaint Chief Complaint  Patient presents with  . Fall    HPI Anita Hunter is a 26 y.o. female.  The history is provided by the patient and medical records. No language interpreter was used.  Fall  Associated symptoms include headaches.   Anita Hunter is a 26 y.o. female  with a PMH of back pain, eczema, back pain, obesity who presents to the Emergency Department for evaluation after fall which occurred approx. 24 hours ago. Patient states that she works at [employer]. She was walking when she stumbled over a palate and fell onto her bilateral hands then struck left knee/hip. No head injury or LOC. She felt fine initially after the fall. She continued to work and went home as usual with no complaints. Upon awakening this afternoon (works 3rd shift), she felt left knee pain and bilateral feet felt swollen. She soaked them and swelling resolved. She also endorsed throbbing headache. Ibuprofen taken yesterday. No other medications prior to arrival for symptoms. No numbness, tingling, back pain, neck pain, weakness, n/v.    Past Medical History:  Diagnosis Date  . Back pain   . Eczema   . Seasonal allergies   . Sleep apnea     Patient Active Problem List   Diagnosis Date Noted  . Obstructive sleep apnea 08/27/2016  . Prediabetes 03/02/2015  . Sinusitis, chronic 03/25/2014  . Tobacco abuse 06/14/2012  . MORBID OBESITY 04/11/2006  . Eczema 04/11/2006    History reviewed. No pertinent surgical history.  OB History    No data available       Home Medications    Prior to Admission medications   Medication Sig Start Date End Date Taking? Authorizing Provider  diclofenac (VOLTAREN) 50 MG EC tablet Take 1 tablet (50 mg total) by mouth 2 (two) times daily. 06/24/16   Ashley Murrain, NP  lidocaine (LIDODERM) 5 % Place 1 patch onto the skin daily. Remove &  Discard patch within 12 hours or as directed by MD 06/24/16   Ashley Murrain, NP  methocarbamol (ROBAXIN) 500 MG tablet Take 1 tablet (500 mg total) by mouth 2 (two) times daily. 06/24/16   Ashley Murrain, NP    Family History Family History  Problem Relation Age of Onset  . Hypertension Father   . Migraines Father   . Asthma Brother        No formal diagnosis as of 06/13/2012  . Diabetes Maternal Grandmother   . Hypertension Maternal Grandmother   . Hypertension Paternal Grandmother   . Fibroids Other        Uncertain which family member(s)    Social History Social History  Substance Use Topics  . Smoking status: Never Smoker  . Smokeless tobacco: Never Used  . Alcohol use No     Allergies   Shellfish allergy   Review of Systems Review of Systems  Musculoskeletal: Positive for arthralgias and myalgias. Negative for back pain and neck pain.  Neurological: Positive for headaches. Negative for dizziness, syncope, weakness and numbness.  All other systems reviewed and are negative.    Physical Exam Updated Vital Signs BP 122/86 (BP Location: Right Arm)   Pulse 88   Temp 97.8 F (36.6 C) (Oral)   Resp 16   Ht 5' 9"  (1.753 m)   Wt (!) 160.6 kg (354 lb)   LMP 08/27/2016  SpO2 97%   BMI 52.28 kg/m   Physical Exam  Constitutional: She is oriented to person, place, and time. She appears well-developed and well-nourished. No distress.  HENT:  Head: Normocephalic and atraumatic. Head is without raccoon's eyes and without Battle's sign.  Right Ear: No hemotympanum.  Left Ear: No hemotympanum.  Nose: Nose normal.  Neck: Normal range of motion.  Cardiovascular: Normal rate, regular rhythm and normal heart sounds.   No murmur heard. Pulmonary/Chest: Effort normal and breath sounds normal. No respiratory distress.  Abdominal: Soft. She exhibits no distension. There is no tenderness.  Musculoskeletal:  Tenderness to palpation of anterolateral left knee. Full ROM without  pain. No crepitus. Ligaments intact. All four extremities with 5/5 muscle strength, soft compartments, 2+ distal pulses and equal sensation. No C/T/L spine tenderness. No hip tenderness and hips with full ROM without pain.  Neurological: She is alert and oriented to person, place, and time.  Speech clear and goal oriented. CN 2-12 grossly intact. Normal finger-to-nose and rapid alternating movements. No drift. Strength and sensation intact. Steady gait.  Skin: Skin is warm and dry.  Nursing note and vitals reviewed.    ED Treatments / Results  Labs (all labs ordered are listed, but only abnormal results are displayed) Labs Reviewed - No data to display  EKG  EKG Interpretation None       Radiology Dg Knee Complete 4 Views Left  Result Date: 09/15/2016 CLINICAL DATA:  Left knee pain. Follow-up at work today. Initial encounter. EXAM: LEFT KNEE - COMPLETE 4+ VIEW COMPARISON:  None. FINDINGS: No evidence of fracture, dislocation, or joint effusion. No evidence of arthropathy or other focal bone abnormality. Soft tissues are unremarkable. IMPRESSION: Negative. Electronically Signed   By: Monte Fantasia M.D.   On: 09/15/2016 06:59    Procedures Procedures (including critical care time)  Medications Ordered in ED Medications  naproxen (NAPROSYN) tablet 500 mg (500 mg Oral Given 09/15/16 3154)     Initial Impression / Assessment and Plan / ED Course  I have reviewed the triage vital signs and the nursing notes.  Pertinent labs & imaging results that were available during my care of the patient were reviewed by me and considered in my medical decision making (see chart for details).    Anita Hunter is a 26 y.o. female who presents to ED for evaluation of fall which occurred 24 hours ago. No head injury or LOC. Now has headache. No focal neuro deficits on exam. 0 on [place] CT head rule. Do not believe imaging is warranted at this time. Here with left knee pain as well. X-ray  negative. All four extremities NVI. Symptomatic home care instructions discussed. PCP follow up if symptoms persist. Reasons to return to ER discussed and all questions answered.    Final Clinical Impressions(s) / ED Diagnoses   Final diagnoses:  Fall, initial encounter  Acute pain of left knee    New Prescriptions New Prescriptions   No medications on file     Ward, Ozella Almond, PA-C 00/86/76 1950    Delora Fuel, MD 93/26/71 [phone]

## 2016-09-15 NOTE — Discharge Instructions (Signed)
It was my pleasure taking care of you today!   Ibuprofen as needed for pain.   Ice affected areas for additional pain relief.   Follow up with your primary care provider if symptoms persist.   Return to ER for new or worsening symptoms, any additional concerns.

## 2016-09-15 NOTE — ED Notes (Signed)
Patient has a little raised area on the left foot which seems to be more swollen than the right foot.

## 2016-09-20 ENCOUNTER — Ambulatory Visit (INDEPENDENT_AMBULATORY_CARE_PROVIDER_SITE_OTHER): Payer: BLUE CROSS/BLUE SHIELD | Admitting: Student

## 2016-09-20 ENCOUNTER — Encounter: Payer: Self-pay | Admitting: Student

## 2016-09-20 DIAGNOSIS — M25562 Pain in left knee: Secondary | ICD-10-CM | POA: Diagnosis not present

## 2016-09-20 DIAGNOSIS — M549 Dorsalgia, unspecified: Secondary | ICD-10-CM | POA: Diagnosis not present

## 2016-09-20 NOTE — Assessment & Plan Note (Signed)
Realized that she has history of left knee pain. This was initially deactivated from her problem list. X-ray of the knee in ED without notable abnormality. Management as above. Follow-up with PCP as needed.

## 2016-09-20 NOTE — Assessment & Plan Note (Addendum)
Likely paraspinal muscle strain based on exam. She is tender to palpation over lumbar paraspinal muscles. Neurovascular exam within normal limits. No red flags concerning for fracture, infection or malignancy. Recommended resuming her Robaxin. She says she gets some left at home. I also suggested taking ibuprofen 600 mg 3 times a day for the next 5 days. Can't try ice/heat as needed. Discussed about proper weightlifting and the importance of losing weight

## 2016-09-20 NOTE — Patient Instructions (Signed)
It was great seeing you today! We have addressed the following issues today 1. Back and leg pain: this is likely muscle spasm after fall. Continue taking the Robaxin. I also recommend taking ibuprofen 600 mg 3 times a day for the next 5 days. You can also try ice or heat. Please come back and see [place] if no improvement over the next 1-2 weeks.   If we did any lab work today, and the results require attention, either me or my nurse will get in touch with you. If everything is normal, you will get a letter in mail and a message via . If you don't hear from [place] in two weeks, please give [place] a call. Otherwise, we look forward to seeing you again at your next visit. If you have any questions or concerns before then, please call the clinic at [phone].  Please bring all your medications to every doctors visit  Sign up for My Chart to have easy access to your labs results, and communication with your Primary care physician.    Please check-out at the front desk before leaving the clinic.    Take Care,   Dr. Cyndia Skeeters

## 2016-09-20 NOTE — [facility] Notes (Signed)
Subjective:    Anita Hunter is a 26 y.o. old female here for left back pain  HPI Left back pain: Patient fell at work about 6 days ago on Friday (09/14/2016 at about 5 AM). She works at [employer], third shift. She was backing up with a cart, and tripped over a pallat behind her and fell over on the left side. She says she tried to break the fall with her 2 hands. She got up and continued her work. She didn't feel pain or notice bruising at that time. She denies hitting her head or loss of consciousness. She went home at the end of her shift. The following morning, she woke up with tightness, swelling and redness in both feet. She soaked her feet in Epsom salts and swelling went down 30 minutes later. Then she went to ED and had an x-ray of the knee which was normal. She was given Robaxin and ibuprofen for pain. She was also recommended RICE.  The pain completely resolved and she was able to go back to work on Monday.  Patient was without pain until last night about 12 AM when she felt left knee pain. About 3 hours later, she lifted 2 lbs box at work and her left upper back started hurting. She describes the pain as cramping and tightness. Initially, she rates her pain 7/10. She said the pain radiates down to her knee over the lateral aspect. She denies numbness and tingling in her legs but admits numbness and tingling in hands bilaterally but not in the arms. Progression is about the same. Pain in her left knee and back is worse with walking.  Patient reports stopping her Robaxin when her pain resolved. She says she had some left at home. She took ibuprofen 600 mg about 5 hours ago which didn't help much. Patient denies previous history of back injury, trauma or surgery. Patient denies fever, bowel or bladder issues or saddle anesthesia PMH/Problem List: has MORBID OBESITY; Eczema; Knee pain, left; Tobacco abuse; Sinusitis, chronic; Prediabetes; Back pain; and Obstructive sleep apnea on her problem list.   has  a past medical history of Back pain; Eczema; Seasonal allergies; and Sleep apnea.  FH:  Family History  Problem Relation Age of Onset  . Hypertension Father   . Migraines Father   . Asthma Brother        No formal diagnosis as of 06/13/2012  . Diabetes Maternal Grandmother   . Hypertension Maternal Grandmother   . Hypertension Paternal Grandmother   . Fibroids Other        Uncertain which family member(s)    [place] Social History  Substance Use Topics  . Smoking status: Never Smoker  . Smokeless tobacco: Never Used  . Alcohol use No    Review of Systems Review of systems negative except for pertinent positives and negatives in history of present illness above.     Objective:     Vitals:   09/20/16 1022  BP: 128/82  Pulse: 97  Temp: 98 F (36.7 C)  TempSrc: Oral  SpO2: 99%  Weight: (!) 357 lb 12.8 oz (162.3 kg)  Height: 5' 9"  (1.753 m)    Physical Exam GEN: obese, appears well, no apparent distress. Head: normocephalic and atraumatic  Eyes: conjunctiva without injection, sclera anicteric CVS: RRR, nl S1&S2, no murmurs, no edema RESP: no IWOB, good air movement bilaterally, CTAB GI: BS present & normal, soft, NTND GU: no suprapubic or CVA tenderness MSK:  Back Normal skin, spine with normal  alignment and no deformity.   No step offs. Tenderness to palpation over lumbar paraspinous muscles bilaterally. ROM is full as much as body habitus allows. Special tests: lying and seated SLR were negative Neuro exam in LE: motor 5/5 in all muscle groups, light and sharp sensation intact in L4-S1 dermatomes, patellar and achille reflexes 1+ bilaterally  knees Normal to inspection with no erythema or effusion or obvious bony abnormalities. No warmth to touch. Has no joint line tenderness, ROM full in flexion and extension and lower leg rotation. Patellar glide with crepitus. Ligaments with solid consistent endpoints including ACL, PCL, LCL, MCL. Non painful patellar  compression. No significant McMurray sign Antalgic gait  UEs: Appears symmetric, no apparent swelling No focal tenderness to palpation Full ROM in the shoulders, elbows and wrist No instability Neuro: Motor 5/5 in all. in all muscle groups,  light and sharp sensation intact.  CVS: radial and ulnar pulses normal. Cap refills brisk. No cyanosis  SKIN: no apparent skin lesion NEURO: as above PSYCH: euthymic mood with congruent affect    Assessment and Plan:  Back pain Likely paraspinal muscle strain based on exam. She is tender to palpation over lumbar paraspinal muscles. Neurovascular exam within normal limits. No red flags concerning for fracture, infection or malignancy. Recommended resuming her Robaxin. She says she gets some left at home. I also suggested taking ibuprofen 600 mg 3 times a day for the next 5 days. Can't try ice/heat as needed. Discussed about proper weightlifting and the importance of losing weight  Knee pain, left Realized that she has history of left knee pain. This was initially deactivated from her problem list. X-ray of the knee in ED without notable abnormality. Management as above. Follow-up with PCP as needed.   Return if symptoms worsen or fail to improve.  Mercy Riding, MD 09/20/16 Pager: [phone]

## 2016-10-16 ENCOUNTER — Encounter (HOSPITAL_COMMUNITY): Payer: Self-pay | Admitting: Emergency Medicine

## 2016-10-16 DIAGNOSIS — Z91013 Allergy to seafood: Secondary | ICD-10-CM | POA: Insufficient documentation

## 2016-10-16 DIAGNOSIS — R1084 Generalized abdominal pain: Secondary | ICD-10-CM | POA: Insufficient documentation

## 2016-10-16 DIAGNOSIS — R102 Pelvic and perineal pain: Secondary | ICD-10-CM | POA: Diagnosis not present

## 2016-10-16 DIAGNOSIS — Z79899 Other long term (current) drug therapy: Secondary | ICD-10-CM | POA: Diagnosis not present

## 2016-10-16 DIAGNOSIS — N3001 Acute cystitis with hematuria: Secondary | ICD-10-CM | POA: Diagnosis not present

## 2016-10-16 LAB — COMPREHENSIVE METABOLIC PANEL
ALBUMIN: 3.4 g/dL — AB (ref 3.5–5.0)
ALT: 28 U/L (ref 14–54)
ANION GAP: 6 (ref 5–15)
AST: 24 U/L (ref 15–41)
Alkaline Phosphatase: 70 U/L (ref 38–126)
BILIRUBIN TOTAL: 0.4 mg/dL (ref 0.3–1.2)
BUN: 9 mg/dL (ref 6–20)
CO2: 28 mmol/L (ref 22–32)
Calcium: 8.2 mg/dL — ABNORMAL LOW (ref 8.9–10.3)
Chloride: 104 mmol/L (ref 101–111)
Creatinine, Ser: 0.81 mg/dL (ref 0.44–1.00)
GFR calc Af Amer: >60 mL/min (ref >60)
GFR calc non Af Amer: >60 mL/min (ref >60)
GLUCOSE: 188 mg/dL — AB (ref 65–99)
POTASSIUM: 3.7 mmol/L (ref 3.5–5.1)
Sodium: 138 mmol/L (ref 135–145)
TOTAL PROTEIN: 7 g/dL (ref 6.5–8.1)

## 2016-10-16 LAB — CBC
HEMATOCRIT: 38.3 % (ref 36.0–46.0)
HEMOGLOBIN: 12.1 g/dL (ref 12.0–15.0)
MCH: 27 pg (ref 26.0–34.0)
MCHC: 31.6 g/dL (ref 30.0–36.0)
MCV: 85.5 fL (ref 78.0–100.0)
Platelets: 278 10*3/uL (ref 150–400)
RBC: 4.48 MIL/uL (ref 3.87–5.11)
RDW: 13.5 % (ref 11.5–15.5)
WBC: 9.6 10*3/uL (ref 4.0–10.5)

## 2016-10-16 LAB — URINALYSIS, ROUTINE W REFLEX MICROSCOPIC
BILIRUBIN URINE: NEGATIVE
Glucose, UA: NEGATIVE mg/dL
Ketones, ur: NEGATIVE mg/dL
Nitrite: NEGATIVE
PH: 8 (ref 5.0–8.0)
Protein, ur: 30 mg/dL — AB
SPECIFIC GRAVITY, URINE: 1.02 (ref 1.005–1.030)

## 2016-10-16 LAB — LIPASE, BLOOD: Lipase: 26 U/L (ref 11–51)

## 2016-10-16 NOTE — ED Triage Notes (Signed)
Pt. Stated, I've had stomach pain all the way down to my vagina.  Its more than cramping. I also have pain in my rectum. This started 4 days and some nausea.

## 2016-10-17 ENCOUNTER — Emergency Department (HOSPITAL_COMMUNITY)
Admission: EM | Admit: 2016-10-17 | Discharge: 2016-10-17 | Disposition: A | Discharge status: Home / Self Care | Payer: BLUE CROSS/BLUE SHIELD | Attending: Emergency Medicine | Admitting: Emergency Medicine

## 2016-10-17 DIAGNOSIS — N3001 Acute cystitis with hematuria: Secondary | ICD-10-CM

## 2016-10-17 DIAGNOSIS — R102 Pelvic and perineal pain: Secondary | ICD-10-CM

## 2016-10-17 MED ORDER — KETOROLAC TROMETHAMINE 30 MG/ML IJ SOLN
15.0000 mg | Freq: Once | INTRAMUSCULAR | Status: AC
  Administered 2016-10-17: 15 mg via INTRAMUSCULAR
  Filled 2016-10-17: qty 1

## 2016-10-17 MED ORDER — IBUPROFEN 400 MG PO TABS: 400.0000 mg | ORAL_TABLET | Freq: Three times a day (TID) | ORAL | 0 refills | Status: AC

## 2016-10-17 MED ORDER — CEPHALEXIN 500 MG PO CAPS: 500.0000 mg | ORAL_CAPSULE | Freq: Two times a day (BID) | ORAL | 0 refills | Status: AC

## 2016-10-17 MED ORDER — PHENAZOPYRIDINE HCL 100 MG PO TABS
95.0000 mg | ORAL_TABLET | Freq: Once | ORAL | Status: AC
  Administered 2016-10-17: 100 mg via ORAL
  Filled 2016-10-17: qty 1

## 2016-10-17 MED ORDER — CEPHALEXIN 250 MG PO CAPS
500.0000 mg | ORAL_CAPSULE | Freq: Once | ORAL | Status: AC
  Administered 2016-10-17: 500 mg via ORAL
  Filled 2016-10-17: qty 2

## 2016-10-17 MED ORDER — PHENAZOPYRIDINE HCL 95 MG PO TABS: 95.0000 mg | ORAL_TABLET | Freq: Three times a day (TID) | ORAL | 0 refills | Status: DC | PRN

## 2016-10-17 NOTE — Discharge Instructions (Signed)
As discussed, your evaluation today has been largely reassuring.  But, it is important that you monitor your condition carefully, and do not hesitate to return to the ED if you develop new, or concerning changes in your condition. ? ?Otherwise, please follow-up with your physician for appropriate ongoing care. ? ?

## 2016-10-17 NOTE — ED Provider Notes (Signed)
[facility] DEPT Provider Note   CSN: [id number] Arrival date & time: 10/16/16  1805     History   Chief Complaint Chief Complaint  Patient presents with  . Abdominal Pain  . Abdominal Cramping    HPI Anita Hunter is a 26 y.o. female.  HPI  She presents with concern of suprapubic pain. Patient has been present for at least 3 days. Patient's menstrual cycle began 6 days ago, and she notes that is not unusual for it to last 6 days. However, though she typically has some degree of pain with menses, this is a more prolonged, more sore pain than usual. Pain is focally in the suprapubic region, but has diffuse radiation, occasionally. There is some nausea, no vomiting, no diarrhea, no fever, no chills. Pain is worse withurination, defecation. Patient is sexually active, bisexual, the last intercourse with a female wasover one year ago. Patient has not seen a gynecologist in some time. She states that she is otherwise generally well. Pain is transiently relieved with OTC NSAID.   Past Medical History:  Diagnosis Date  . Back pain   . Eczema   . Seasonal allergies   . Sleep apnea     Patient Active Problem List   Diagnosis Date Noted  . Obstructive sleep apnea 08/27/2016  . Back pain 06/27/2016  . Prediabetes 03/02/2015  . Sinusitis, chronic 03/25/2014  . Tobacco abuse 06/14/2012  . Knee pain, left 06/13/2012  . MORBID OBESITY 04/11/2006  . Eczema 04/11/2006    History reviewed. No pertinent surgical history.  OB History    No data available       Home Medications    Prior to Admission medications   Medication Sig Start Date End Date Taking? Authorizing Provider  diclofenac (VOLTAREN) 50 MG EC tablet Take 1 tablet (50 mg total) by mouth 2 (two) times daily. 06/24/16   Ashley Murrain, NP  lidocaine (LIDODERM) 5 % Place 1 patch onto the skin daily. Remove & Discard patch within 12 hours or as directed by MD 06/24/16   Ashley Murrain, NP  methocarbamol  (ROBAXIN) 500 MG tablet Take 1 tablet (500 mg total) by mouth 2 (two) times daily. 06/24/16   Ashley Murrain, NP    Family History Family History  Problem Relation Age of Onset  . Hypertension Father   . Migraines Father   . Asthma Brother        No formal diagnosis as of 06/13/2012  . Diabetes Maternal Grandmother   . Hypertension Maternal Grandmother   . Hypertension Paternal Grandmother   . Fibroids Other        Uncertain which family member(s)    Social History Social History  Substance Use Topics  . Smoking status: Never Smoker  . Smokeless tobacco: Never Used  . Alcohol use No     Allergies   Shellfish allergy   Review of Systems Review of Systems  Constitutional:       Per HPI, otherwise negative  HENT:       Per HPI, otherwise negative  Respiratory:       Per HPI, otherwise negative  Cardiovascular:       Per HPI, otherwise negative  Gastrointestinal: Negative for vomiting.  Endocrine:       Negative aside from HPI  Genitourinary:       Neg aside from HPI   Musculoskeletal:       Per HPI, otherwise negative  Skin: Negative.   Neurological: Negative for  syncope.     Physical Exam Updated Vital Signs BP (!) 144/85   Pulse 98   Temp 98.2 F (36.8 C) (Oral)   Resp 17   Ht 5' 9"  (1.753 m)   Wt (!) 161 kg (355 lb)   LMP 09/21/2016   SpO2 94%   BMI 52.42 kg/m   Physical Exam  Constitutional: She is oriented to person, place, and time. She appears well-developed and well-nourished. No distress.  HENT:  Head: Normocephalic and atraumatic.  Eyes: Conjunctivae and EOM are normal.  Cardiovascular: Normal rate and regular rhythm.   Pulmonary/Chest: Effort normal and breath sounds normal. No stridor. No respiratory distress.  Abdominal: She exhibits no distension.    Musculoskeletal: She exhibits no edema.  Neurological: She is alert and oriented to person, place, and time. No cranial nerve deficit.  Skin: Skin is warm and dry.  Psychiatric: She  has a normal mood and affect.  Nursing note and vitals reviewed.    ED Treatments / Results  Labs (all labs ordered are listed, but only abnormal results are displayed) Labs Reviewed  COMPREHENSIVE METABOLIC PANEL - Abnormal; Notable for the following:       Result Value   Glucose, Bld 188 (*)    Calcium 8.2 (*)    Albumin 3.4 (*)    All other components within normal limits  URINALYSIS, ROUTINE W REFLEX MICROSCOPIC - Abnormal; Notable for the following:    APPearance CLOUDY (*)    Hgb urine dipstick LARGE (*)    Protein, ur 30 (*)    Leukocytes, UA LARGE (*)    Bacteria, UA RARE (*)    Squamous Epithelial / LPF 6-30 (*)    All other components within normal limits  LIPASE, BLOOD  CBC    Procedures Procedures (including critical care time)  Medications Ordered in ED Medications  ketorolac (TORADOL) 30 MG/ML injection 15 mg (not administered)  phenazopyridine (PYRIDIUM) tablet 100 mg (not administered)  cephALEXin (KEFLEX) capsule 500 mg (not administered)     Initial Impression / Assessment and Plan / ED Course  I have reviewed the triage vital signs and the nursing notes.  Pertinent labs & imaging results that were available during my care of the patient were reviewed by me and considered in my medical decision making (see chart for details).  Appearing female presents with suprapubic pain, nausea. Here she is awake, alert, afebrile, with no evidence for peritonitis. No unilateral pain, and given the patient's ongoing menses, and suprapubic pain, there some suspicion for menstrual etiology, though there is also evidence for UTI. Patient started on antibiotics, anti-inflammatory, will follow up with gynecology in the clinic for consideration of ultrasound for fibroid and other pathology if she does not improve in 48 hours.   Final Clinical Impressions(s) / ED Diagnoses  Abdominal pain Urinary tract infection   Carmin Muskrat, MD 10/17/16 0221

## 2016-10-18 ENCOUNTER — Inpatient Hospital Stay (HOSPITAL_COMMUNITY)
Admission: AD | Admit: 2016-10-18 | Discharge: 2016-10-18 | Disposition: A | Discharge status: Home / Self Care | Payer: BLUE CROSS/BLUE SHIELD | Source: Ambulatory Visit | Attending: Obstetrics and Gynecology | Admitting: Obstetrics and Gynecology

## 2016-10-18 ENCOUNTER — Encounter (HOSPITAL_COMMUNITY): Payer: Self-pay | Admitting: *Deleted

## 2016-10-18 DIAGNOSIS — R1084 Generalized abdominal pain: Secondary | ICD-10-CM

## 2016-10-18 DIAGNOSIS — R319 Hematuria, unspecified: Secondary | ICD-10-CM | POA: Diagnosis not present

## 2016-10-18 DIAGNOSIS — N390 Urinary tract infection, site not specified: Secondary | ICD-10-CM

## 2016-10-18 DIAGNOSIS — Z3202 Encounter for pregnancy test, result negative: Secondary | ICD-10-CM | POA: Diagnosis not present

## 2016-10-18 DIAGNOSIS — R109 Unspecified abdominal pain: Secondary | ICD-10-CM | POA: Diagnosis present

## 2016-10-18 LAB — CBC WITH DIFFERENTIAL/PLATELET
Basophils Absolute: 0 10*3/uL (ref 0.0–0.1)
Basophils Relative: 0 %
Eosinophils Absolute: 0.1 10*3/uL (ref 0.0–0.7)
Eosinophils Relative: 1 %
HEMATOCRIT: 39.2 % (ref 36.0–46.0)
Hemoglobin: 12.5 g/dL (ref 12.0–15.0)
LYMPHS ABS: 2.2 10*3/uL (ref 0.7–4.0)
Lymphocytes Relative: 20 %
MCH: 27.3 pg (ref 26.0–34.0)
MCHC: 31.9 g/dL (ref 30.0–36.0)
MCV: 85.6 fL (ref 78.0–100.0)
MONO ABS: 0.3 10*3/uL (ref 0.1–1.0)
MONOS PCT: 3 %
NEUTROS ABS: 8.3 10*3/uL — AB (ref 1.7–7.7)
Neutrophils Relative %: 76 %
Platelets: 270 10*3/uL (ref 150–400)
RBC: 4.58 MIL/uL (ref 3.87–5.11)
RDW: 13.4 % (ref 11.5–15.5)
WBC: 10.9 10*3/uL — ABNORMAL HIGH (ref 4.0–10.5)

## 2016-10-18 LAB — COMPREHENSIVE METABOLIC PANEL
ALBUMIN: 3.4 g/dL — AB (ref 3.5–5.0)
ALK PHOS: 71 U/L (ref 38–126)
ALT: 28 U/L (ref 14–54)
AST: 24 U/L (ref 15–41)
Anion gap: 8 (ref 5–15)
BILIRUBIN TOTAL: 0.5 mg/dL (ref 0.3–1.2)
BUN: 12 mg/dL (ref 6–20)
CALCIUM: 8.3 mg/dL — AB (ref 8.9–10.3)
CO2: 28 mmol/L (ref 22–32)
Chloride: 102 mmol/L (ref 101–111)
Creatinine, Ser: 0.78 mg/dL (ref 0.44–1.00)
GFR calc Af Amer: >60 mL/min (ref >60)
GFR calc non Af Amer: >60 mL/min (ref >60)
GLUCOSE: 99 mg/dL (ref 65–99)
Potassium: 3.8 mmol/L (ref 3.5–5.1)
Sodium: 138 mmol/L (ref 135–145)
Total Protein: 7.2 g/dL (ref 6.5–8.1)

## 2016-10-18 LAB — WET PREP, GENITAL
Sperm: NONE SEEN
Trich, Wet Prep: NONE SEEN
Yeast Wet Prep HPF POC: NONE SEEN

## 2016-10-18 LAB — URINALYSIS, ROUTINE W REFLEX MICROSCOPIC
Glucose, UA: 100 mg/dL — AB
KETONES UR: 15 mg/dL — AB
Nitrite: POSITIVE — AB
PROTEIN: 100 mg/dL — AB
Specific Gravity, Urine: 1.02 (ref 1.005–1.030)
pH: 5.5 (ref 5.0–8.0)

## 2016-10-18 LAB — POCT PREGNANCY, URINE: PREG TEST UR: NEGATIVE

## 2016-10-18 LAB — URINALYSIS, MICROSCOPIC (REFLEX)

## 2016-10-18 LAB — LIPASE, BLOOD: Lipase: 22 U/L (ref 11–51)

## 2016-10-18 MED ORDER — MORPHINE SULFATE (PF) 4 MG/ML IV SOLN
2.0000 mg | Freq: Once | INTRAVENOUS | Status: AC
  Administered 2016-10-18: 2 mg via INTRAMUSCULAR
  Filled 2016-10-18: qty 1

## 2016-10-18 MED ORDER — PROMETHAZINE HCL 25 MG/ML IJ SOLN
12.5000 mg | Freq: Once | INTRAMUSCULAR | Status: AC
  Administered 2016-10-18: 12.5 mg via INTRAMUSCULAR
  Filled 2016-10-18: qty 1

## 2016-10-18 NOTE — Discharge Instructions (Signed)
Abdominal Pain, Adult Abdominal pain can be caused by many things. Often, abdominal pain is not serious and it gets better with no treatment or by being treated at home. However, sometimes abdominal pain is serious. Your health care provider will do a medical history and a physical exam to try to determine the cause of your abdominal pain. Follow these instructions at home:  Take over-the-counter and prescription medicines only as told by your health care provider. Do not take a laxative unless told by your health care provider.  Drink enough fluid to keep your urine clear or pale yellow.  Watch your condition for any changes.  Keep all follow-up visits as told by your health care provider. This is important. Contact a health care provider if:  Your abdominal pain changes or gets worse.  You are not hungry or you lose weight without trying.  You are constipated or have diarrhea for more than 2-3 days.  You have pain when you urinate or have a bowel movement.  Your abdominal pain wakes you up at night.  Your pain gets worse with meals, after eating, or with certain foods.  You are throwing up and cannot keep anything down.  You have a fever. Get help right away if:  Your pain does not go away as soon as your health care provider told you to expect.  You cannot stop throwing up.  Your pain is only in areas of the abdomen, such as the right side or the left lower portion of the abdomen.  You have bloody or black stools, or stools that look like tar.  You have severe pain, cramping, or bloating in your abdomen.  You have signs of dehydration, such as: ? Dark urine, very little urine, or no urine. ? Cracked lips. ? Dry mouth. ? Sunken eyes. ? Sleepiness. ? Weakness. This information is not intended to replace advice given to you by your health care provider. Make sure you discuss any questions you have with your health care provider. Document Released: 11/08/2004 Document  Revised: 08/19/2015 Document Reviewed: 07/13/2015 Elsevier Interactive Patient Education  2017 [employer]  Urinary Tract Infection, Adult A urinary tract infection (UTI) is an infection of any part of the urinary tract. The urinary tract includes the: Kidneys. Ureters. Bladder. Urethra.  These organs make, store, and get rid of pee (urine) in the body. Follow these instructions at home: Take over-the-counter and prescription medicines only as told by your doctor. If you were prescribed an antibiotic medicine, take it as told by your doctor. Do not stop taking the antibiotic even if you start to feel better. Avoid the following drinks: Alcohol. Caffeine. Tea. Carbonated drinks. Drink enough fluid to keep your pee clear or pale yellow. Keep all follow-up visits as told by your doctor. This is important. Make sure to: Empty your bladder often and completely. Do not to hold pee for long periods of time. Empty your bladder before and after sex. Wipe from front to back after a bowel movement if you are female. Use each tissue one time when you wipe. Contact a doctor if: You have back pain. You have a fever. You feel sick to your stomach (nauseous). You throw up (vomit). Your symptoms do not get better after 3 days. Your symptoms go away and then come back. Get help right away if: You have very bad back pain. You have very bad lower belly (abdominal) pain. You are throwing up and cannot keep down any medicines or water. This  information is not intended to replace advice given to you by your health care provider. Make sure you discuss any questions you have with your health care provider. Document Released: 07/18/2007 Document Revised: 07/07/2015 Document Reviewed: 12/20/2014 Elsevier Interactive Patient Education  [employer].

## 2016-10-18 NOTE — MAU Provider Note (Signed)
History     CSN: [id number]  Arrival date and time: 10/18/16 1100   First Provider Initiated Contact with Patient 10/18/16 1157      Chief Complaint  Patient presents with  . Abdominal Pain    Sharp pains    26 y.o. non-pregnant female here with abdominal pain. Pain started about 1 week ago when menses started. Pain has continued and worsened since. Describes pain as cramping, intermittent, starting in lower abdomen and radiating to upper abdomen. Rates 8/10. Feels nausea when pain at peak. No vomiting. No fevers. Felt constipated and took castor oil yesterday, had several BMs. Denies vaginal discharge. New same sex partner about 3 months ago, hasn't had STD testing. Seen in another ED 2 days ago and dx with UTI, currently taking AZO, Keflex, and Ibuprofen.    Past Medical History:  Diagnosis Date  . Back pain   . Eczema   . Seasonal allergies   . Sleep apnea     Past Surgical History:  Procedure Laterality Date  . NO PAST SURGERIES      Family History  Problem Relation Age of Onset  . Hypertension Father   . Migraines Father   . Asthma Brother        No formal diagnosis as of 06/13/2012  . Diabetes Maternal Grandmother   . Hypertension Maternal Grandmother   . Hypertension Paternal Grandmother   . Fibroids Other        Uncertain which family member(s)    Social History  Substance Use Topics  . Smoking status: Never Smoker  . Smokeless tobacco: Never Used  . Alcohol use No    Allergies:  Allergies  Allergen Reactions  . Shellfish Allergy Nausea Only    Prescriptions Prior to Admission  Medication Sig Dispense Refill Last Dose  . ibuprofen (ADVIL,MOTRIN) 400 MG tablet Take 1 tablet (400 mg total) by mouth 3 (three) times daily. Take one tablet three times daily for three days 10 tablet 0 10/17/2016 at Unknown time  . phenazopyridine (PYRIDIUM) 95 MG tablet Take 1 tablet (95 mg total) by mouth 3 (three) times daily as needed for pain. (Patient taking differently:  Take 95 mg by mouth 3 (three) times daily as needed for pain. Pt taking Azo.) 10 tablet 0 10/18/2016 at Unknown time  . cephALEXin (KEFLEX) 500 MG capsule Take 1 capsule (500 mg total) by mouth 2 (two) times daily. (Patient not taking: Reported on 10/18/2016) 10 capsule 0 Not Taking at Unknown time  . diclofenac (VOLTAREN) 50 MG EC tablet Take 1 tablet (50 mg total) by mouth 2 (two) times daily. 15 tablet 0 10/16/2016  . lidocaine (LIDODERM) 5 % Place 1 patch onto the skin daily. Remove & Discard patch within 12 hours or as directed by MD (Patient not taking: Reported on 10/18/2016) 30 patch 0 Not Taking at Unknown time  . methocarbamol (ROBAXIN) 500 MG tablet Take 1 tablet (500 mg total) by mouth 2 (two) times daily. 20 tablet 0 10/16/2016    Review of Systems  Constitutional: Negative for chills and fever.  Gastrointestinal: Positive for abdominal pain and nausea. Negative for constipation, diarrhea and vomiting.  Genitourinary: Negative for dysuria, frequency, vaginal bleeding and vaginal discharge.   Physical Exam   Blood pressure 138/88, pulse (!) 101, temperature 98 F (36.7 C), temperature source Oral, resp. rate 16, height 5' 9"  (1.753 m), weight (!) 354 lb (160.6 kg), last menstrual period 10/10/2016.  Physical Exam  Constitutional: She is oriented to person, place,  and time. She appears well-developed and well-nourished.  HENT:  Head: Normocephalic and atraumatic.  Neck: Normal range of motion.  Respiratory: Effort normal. No respiratory distress.  GI: Soft. She exhibits no distension and no mass. There is tenderness in the right upper quadrant, right lower quadrant, suprapubic area, left upper quadrant and left lower quadrant. There is no rebound and no guarding.  Genitourinary:  Genitourinary Comments: External: no lesions or erythema Vagina: rugated, pink, moist, scant thin brown discharge Uterus: no CMT Adnexae: + tenderness left, no tenderness right *exam difficult d/t body  habitus   Musculoskeletal: Normal range of motion.  Neurological: She is alert and oriented to person, place, and time.  Skin: Skin is warm and dry.  Psychiatric: She has a normal mood and affect.   Results for orders placed or performed during the hospital encounter of 10/18/16 (from the past 24 hour(s))  Urinalysis, Routine w reflex microscopic     Status: Abnormal   Collection Time: 10/18/16 11:16 AM  Result Value Ref Range   Color, Urine ORANGE (A) YELLOW   APPearance CLEAR CLEAR   Specific Gravity, Urine 1.020 1.005 - 1.030   pH 5.5 5.0 - 8.0   Glucose, UA 100 (A) NEGATIVE mg/dL   Hgb urine dipstick MODERATE (A) NEGATIVE   Bilirubin Urine SMALL (A) NEGATIVE   Ketones, ur 15 (A) NEGATIVE mg/dL   Protein, ur 100 (A) NEGATIVE mg/dL   Nitrite POSITIVE (A) NEGATIVE   Leukocytes, UA SMALL (A) NEGATIVE  Urinalysis, Microscopic (reflex)     Status: Abnormal   Collection Time: 10/18/16 11:16 AM  Result Value Ref Range   RBC / HPF 0-5 0 - 5 RBC/hpf   WBC, UA 6-30 0 - 5 WBC/hpf   Bacteria, UA FEW (A) NONE SEEN   Squamous Epithelial / LPF 0-5 (A) NONE SEEN   Urine-Other MUCOUS PRESENT   Pregnancy, urine POC     Status: None   Collection Time: 10/18/16 11:40 AM  Result Value Ref Range   Preg Test, Ur NEGATIVE NEGATIVE  Wet prep, genital     Status: Abnormal   Collection Time: 10/18/16 12:15 PM  Result Value Ref Range   Yeast Wet Prep HPF POC NONE SEEN NONE SEEN   Trich, Wet Prep NONE SEEN NONE SEEN   Clue Cells Wet Prep HPF POC PRESENT (A) NONE SEEN   WBC, Wet Prep HPF POC MANY (A) NONE SEEN   Sperm NONE SEEN   Comprehensive metabolic panel     Status: Abnormal   Collection Time: 10/18/16 12:33 PM  Result Value Ref Range   Sodium 138 135 - 145 mmol/L   Potassium 3.8 3.5 - 5.1 mmol/L   Chloride 102 101 - 111 mmol/L   CO2 28 22 - 32 mmol/L   Glucose, Bld 99 65 - 99 mg/dL   BUN 12 6 - 20 mg/dL   Creatinine, Ser 0.78 0.44 - 1.00 mg/dL   Calcium 8.3 (L) 8.9 - 10.3 mg/dL    Total Protein 7.2 6.5 - 8.1 g/dL   Albumin 3.4 (L) 3.5 - 5.0 g/dL   AST 24 15 - 41 U/L   ALT 28 14 - 54 U/L   Alkaline Phosphatase 71 38 - 126 U/L   Total Bilirubin 0.5 0.3 - 1.2 mg/dL   GFR calc non Af Amer >60 >60 mL/min   GFR calc Af Amer >60 >60 mL/min   Anion gap 8 5 - 15  Lipase, blood     Status: None  Collection Time: 10/18/16 12:33 PM  Result Value Ref Range   Lipase 22 11 - 51 U/L  CBC with Differential/Platelet     Status: Abnormal   Collection Time: 10/18/16 12:33 PM  Result Value Ref Range   WBC 10.9 (H) 4.0 - 10.5 K/uL   RBC 4.58 3.87 - 5.11 MIL/uL   Hemoglobin 12.5 12.0 - 15.0 g/dL   HCT 39.2 36.0 - 46.0 %   MCV 85.6 78.0 - 100.0 fL   MCH 27.3 26.0 - 34.0 pg   MCHC 31.9 30.0 - 36.0 g/dL   RDW 13.4 11.5 - 15.5 %   Platelets 270 150 - 400 K/uL   Neutrophils Relative % 76 %   Neutro Abs 8.3 (H) 1.7 - 7.7 K/uL   Lymphocytes Relative 20 %   Lymphs Abs 2.2 0.7 - 4.0 K/uL   Monocytes Relative 3 %   Monocytes Absolute 0.3 0.1 - 1.0 K/uL   Eosinophils Relative 1 %   Eosinophils Absolute 0.1 0.0 - 0.7 K/uL   Basophils Relative 0 %   Basophils Absolute 0.0 0.0 - 0.1 K/uL   MAU Course  Procedures Morphine IM  MDM Labs ordered and reviewed. Pain improved, no 3/10. No evidence of acute abdominal or pelvic process, unlikely GYN source. Recommend continue tx for UTI, UC ordered today. Stable for discharge home.  Assessment and Plan   1. Abdominal pain, generalized   2. Urinary tract infection with hematuria, site unspecified    Discharge home Follow up with [facility] if sx persist Return precautions  Allergies as of 10/18/2016      Reactions   Shellfish Allergy Nausea Only      Medication List    STOP taking these medications   lidocaine 5 % Commonly known as:  LIDODERM     TAKE these medications   cephALEXin 500 MG capsule Commonly known as:  KEFLEX Take 1 capsule (500 mg total) by mouth 2 (two) times daily.   diclofenac 50 MG EC  tablet Commonly known as:  VOLTAREN Take 1 tablet (50 mg total) by mouth 2 (two) times daily.   ibuprofen 400 MG tablet Commonly known as:  ADVIL,MOTRIN Take 1 tablet (400 mg total) by mouth 3 (three) times daily. Take one tablet three times daily for three days   methocarbamol 500 MG tablet Commonly known as:  ROBAXIN Take 1 tablet (500 mg total) by mouth 2 (two) times daily.   phenazopyridine 95 MG tablet Commonly known as:  PYRIDIUM Take 1 tablet (95 mg total) by mouth 3 (three) times daily as needed for pain. What changed:  additional instructions            Discharge Care Instructions        Start     Ordered   10/18/16 0000  Discharge patient    Question Answer Comment  Discharge disposition 01-Home or Self Care   Discharge patient date 10/18/2016      10/18/16 [place], CNM 10/18/2016, 12:20 PM

## 2016-10-18 NOTE — MAU Note (Addendum)
Pt. Reports having abdominal pain that started last Wednesday with menstrual cycle. Pt believed that it was severe cramps but is still having pain after cycle has ended. Pt. Denies having any back pain. Pain is throughout abdominal area.

## 2016-10-19 LAB — URINE CULTURE: CULTURE: NO GROWTH

## 2016-10-19 LAB — GC/CHLAMYDIA PROBE AMP (~~LOC~~) NOT AT ARMC
Chlamydia: POSITIVE — AB
Neisseria Gonorrhea: NEGATIVE

## 2016-10-21 ENCOUNTER — Emergency Department (HOSPITAL_BASED_OUTPATIENT_CLINIC_OR_DEPARTMENT_OTHER)
Admission: EM | Admit: 2016-10-21 | Discharge: 2016-10-21 | Disposition: A | Discharge status: Home / Self Care | Payer: BLUE CROSS/BLUE SHIELD | Attending: Emergency Medicine | Admitting: Emergency Medicine

## 2016-10-21 ENCOUNTER — Encounter (HOSPITAL_BASED_OUTPATIENT_CLINIC_OR_DEPARTMENT_OTHER): Payer: Self-pay

## 2016-10-21 DIAGNOSIS — Z791 Long term (current) use of non-steroidal anti-inflammatories (NSAID): Secondary | ICD-10-CM | POA: Insufficient documentation

## 2016-10-21 DIAGNOSIS — R102 Pelvic and perineal pain: Secondary | ICD-10-CM | POA: Insufficient documentation

## 2016-10-21 DIAGNOSIS — A749 Chlamydial infection, unspecified: Secondary | ICD-10-CM | POA: Insufficient documentation

## 2016-10-21 DIAGNOSIS — Z79899 Other long term (current) drug therapy: Secondary | ICD-10-CM | POA: Insufficient documentation

## 2016-10-21 DIAGNOSIS — K2900 Acute gastritis without bleeding: Secondary | ICD-10-CM | POA: Insufficient documentation

## 2016-10-21 DIAGNOSIS — R109 Unspecified abdominal pain: Secondary | ICD-10-CM | POA: Diagnosis present

## 2016-10-21 MED ORDER — METRONIDAZOLE 500 MG PO TABS: 500.0000 mg | ORAL_TABLET | Freq: Two times a day (BID) | ORAL | 0 refills | Status: DC

## 2016-10-21 MED ORDER — DOXYCYCLINE HYCLATE 100 MG PO CAPS: 100.0000 mg | ORAL_CAPSULE | Freq: Two times a day (BID) | ORAL | 0 refills | Status: DC

## 2016-10-21 MED ORDER — CEFTRIAXONE SODIUM 250 MG IJ SOLR
250.0000 mg | Freq: Once | INTRAMUSCULAR | Status: AC
  Administered 2016-10-21: 250 mg via INTRAMUSCULAR
  Filled 2016-10-21: qty 250

## 2016-10-21 MED ORDER — IBUPROFEN 800 MG PO TABS: 800.0000 mg | ORAL_TABLET | Freq: Three times a day (TID) | ORAL | 0 refills | Status: DC

## 2016-10-21 MED ORDER — METRONIDAZOLE 500 MG PO TABS
500.0000 mg | ORAL_TABLET | Freq: Once | ORAL | Status: AC
  Administered 2016-10-21: 500 mg via ORAL
  Filled 2016-10-21: qty 1

## 2016-10-21 MED ORDER — DOXYCYCLINE HYCLATE 100 MG PO TABS
100.0000 mg | ORAL_TABLET | Freq: Once | ORAL | Status: AC
  Administered 2016-10-21: 100 mg via ORAL
  Filled 2016-10-21: qty 1

## 2016-10-21 MED ORDER — FAMOTIDINE 20 MG PO TABS: 20.0000 mg | ORAL_TABLET | Freq: Two times a day (BID) | ORAL | 0 refills | Status: DC

## 2016-10-21 NOTE — Discharge Instructions (Signed)
1. Make sure any sexual contact partners are evaluated and treated for STD. Failure to do so may result in transfer and reinfection. 2. Follow-up with her gynecologist in 7-10 days. Return to the emergency department if her symptoms are worsening or changing. 3. Take all medications as prescribed.

## 2016-10-21 NOTE — ED Notes (Signed)
ED Provider at bedside. 

## 2016-10-21 NOTE — ED Provider Notes (Signed)
[facility] Provider Note   CSN: [id number] Arrival date & time: 10/21/16  1617     History   Chief Complaint Chief Complaint  Patient presents with  . Abdominal Pain    HPI ARMINE RIZZOLO is a 26 y.o. female.  HPI Patient reports she's had lower abdominal pain for 2 weeks. She reports is got a burning and cramping sensation. She has had some loose stool. She reports that onset she thought it was due to her menstrual cycle. She sometimes gets these symptoms associated with it. Her cycle however has finished and she continues to have persisting pain. Patient has been seen in the emergency department and at [facility]. She was diagnosed with UTI. She reports she completed her Keflex. She reports that her urinary symptoms were very mild at outset and they have resolved but the initial pain that she was experiencing is persisting. She has not developed any vomiting. She does note that when she eats it seems to make the pain get even more intense. She reports last sexual activity with a female partner was a year ago. She reports that she donates plasma and thus has HIV and hepatitis testing regularly. Last donation was 2 weeks ago. Past Medical History:  Diagnosis Date  . Back pain   . Eczema   . Seasonal allergies   . Sleep apnea     Patient Active Problem List   Diagnosis Date Noted  . Obstructive sleep apnea 08/27/2016  . Back pain 06/27/2016  . Prediabetes 03/02/2015  . Sinusitis, chronic 03/25/2014  . Tobacco abuse 06/14/2012  . Knee pain, left 06/13/2012  . MORBID OBESITY 04/11/2006  . Eczema 04/11/2006    Past Surgical History:  Procedure Laterality Date  . NO PAST SURGERIES      OB History    Gravida Para Term Preterm AB Living   0 0 0 0 0 0   SAB TAB Ectopic Multiple Live Births   0 0 0 0 0       Home Medications    Prior to Admission medications   Medication Sig Start Date End Date Taking? Authorizing Provider  diclofenac (VOLTAREN) 50  MG EC tablet Take 1 tablet (50 mg total) by mouth 2 (two) times daily. 06/24/16  Yes Neese, Hope M, NP  methocarbamol (ROBAXIN) 500 MG tablet Take 1 tablet (500 mg total) by mouth 2 (two) times daily. 06/24/16  Yes Neese, [place], NP  cephALEXin (KEFLEX) 500 MG capsule Take 1 capsule (500 mg total) by mouth 2 (two) times daily. Patient not taking: Reported on 10/18/2016 10/17/16 10/22/16  Carmin Muskrat, MD  doxycycline (VIBRAMYCIN) 100 MG capsule Take 1 capsule (100 mg total) by mouth 2 (two) times daily. 10/21/16   Charlesetta Shanks, MD  famotidine (PEPCID) 20 MG tablet Take 1 tablet (20 mg total) by mouth 2 (two) times daily. 10/21/16   Charlesetta Shanks, MD  ibuprofen (ADVIL,MOTRIN) 800 MG tablet Take 1 tablet (800 mg total) by mouth 3 (three) times daily. 10/21/16   Charlesetta Shanks, MD  metroNIDAZOLE (FLAGYL) 500 MG tablet Take 1 tablet (500 mg total) by mouth 2 (two) times daily. 10/21/16   Charlesetta Shanks, MD  phenazopyridine (PYRIDIUM) 95 MG tablet Take 1 tablet (95 mg total) by mouth 3 (three) times daily as needed for pain. Patient taking differently: Take 95 mg by mouth 3 (three) times daily as needed for pain. Pt taking Azo. 10/17/16   Carmin Muskrat, MD    Family History Family History  Problem Relation Age of Onset  . Hypertension Father   . Migraines Father   . Asthma Brother        No formal diagnosis as of 06/13/2012  . Diabetes Maternal Grandmother   . Hypertension Maternal Grandmother   . Hypertension Paternal Grandmother   . Fibroids Other        Uncertain which family member(s)    Social History Social History  Substance Use Topics  . Smoking status: Never Smoker  . Smokeless tobacco: Never Used  . Alcohol use No     Allergies   Shellfish allergy   Review of Systems Review of Systems 10 Systems reviewed and are negative for acute change except as noted in the HPI.   Physical Exam Updated Vital Signs BP (!) 140/92 (BP Location: Right Arm)   Pulse 99   Temp 98.3 F  (36.8 C) (Oral)   Resp 18   Ht 5' 9"  (1.753 m)   Wt (!) 160.6 kg (354 lb)   LMP 10/10/2016   SpO2 99%   BMI 52.28 kg/m   Physical Exam  Constitutional: She is oriented to person, place, and time. She appears well-developed and well-nourished. No distress.  HENT:  Head: Normocephalic and atraumatic.  Eyes: Conjunctivae are normal.  Neck: Neck supple.  Cardiovascular: Normal rate and regular rhythm.   No murmur heard. Pulmonary/Chest: Effort normal and breath sounds normal. No respiratory distress.  Abdominal: Soft. There is tenderness.  Patient endorses diffuse abdominal tenderness much more pronounced in the suprapubic area. No CVA tenderness.  Musculoskeletal: Normal range of motion. She exhibits no edema or tenderness.  Neurological: She is alert and oriented to person, place, and time. She exhibits normal muscle tone. Coordination normal.  Skin: Skin is warm and dry.  Psychiatric: She has a normal mood and affect.  Nursing note and vitals reviewed.    ED Treatments / Results  Labs (all labs ordered are listed, but only abnormal results are displayed) Labs Reviewed - No data to display  EKG  EKG Interpretation None       Radiology No results found.  Procedures Procedures (including critical care time)  Medications Ordered in ED Medications  cefTRIAXone (ROCEPHIN) injection 250 mg (not administered)  metroNIDAZOLE (FLAGYL) tablet 500 mg (not administered)  doxycycline (VIBRA-TABS) tablet 100 mg (not administered)     Initial Impression / Assessment and Plan / ED Course  I have reviewed the triage vital signs and the nursing notes.  Pertinent labs & imaging results that were available during my care of the patient were reviewed by me and considered in my medical decision making (see chart for details).     Final Clinical Impressions(s) / ED Diagnoses   Final diagnoses:  Chlamydia  Acute gastritis without hemorrhage, unspecified gastritis type  Pelvic  pain in female  Patient has persistent pain after treatment with Keflex for diagnoses of UTI. Review of EMR indicates that her pelvic cultures done on 9\6 have resulted positive for chlamydia. At this time, I most suspect this is the etiology with positive result. Patient's pain is very diffuse and is not suggestive of surgical etiology. She reports menstrual bleeding stopped a week ago. She is not having any UTI symptoms. Patient will be treated for cervicitis\PID. Patient was given Rocephin 250 mg IM. She is started on doxycycline twice daily for 10 days. Also started on Flagyl. Patient is counseled on signs and symptoms first return.  New Prescriptions New Prescriptions   DOXYCYCLINE (VIBRAMYCIN) 100 MG CAPSULE  Take 1 capsule (100 mg total) by mouth 2 (two) times daily.   FAMOTIDINE (PEPCID) 20 MG TABLET    Take 1 tablet (20 mg total) by mouth 2 (two) times daily.   IBUPROFEN (ADVIL,MOTRIN) 800 MG TABLET    Take 1 tablet (800 mg total) by mouth 3 (three) times daily.   METRONIDAZOLE (FLAGYL) 500 MG TABLET    Take 1 tablet (500 mg total) by mouth 2 (two) times daily.     Charlesetta Shanks, MD 10/21/16 1745

## 2016-10-21 NOTE — ED Triage Notes (Addendum)
PT reports generalized abdominal pain, centralized, worsens 1 hr after eating. States seen 3x for same. Pt reports pain persisting for 2 weeks. PT reports she was drinking herbal tea with CBD oil with THC in it but stopped it when abd pain began. Denies hematochezia, n/v/d. States GYN workup negative.

## 2016-10-23 ENCOUNTER — Encounter (HOSPITAL_COMMUNITY): Payer: Self-pay | Admitting: Emergency Medicine

## 2016-10-23 ENCOUNTER — Emergency Department (HOSPITAL_COMMUNITY): Payer: BLUE CROSS/BLUE SHIELD

## 2016-10-23 ENCOUNTER — Emergency Department (HOSPITAL_COMMUNITY)
Admission: EM | Admit: 2016-10-23 | Discharge: 2016-10-24 | Disposition: A | Discharge status: Home / Self Care | Payer: BLUE CROSS/BLUE SHIELD | Attending: Emergency Medicine | Admitting: Emergency Medicine

## 2016-10-23 DIAGNOSIS — N898 Other specified noninflammatory disorders of vagina: Secondary | ICD-10-CM | POA: Insufficient documentation

## 2016-10-23 DIAGNOSIS — N83201 Unspecified ovarian cyst, right side: Secondary | ICD-10-CM

## 2016-10-23 DIAGNOSIS — Z79899 Other long term (current) drug therapy: Secondary | ICD-10-CM | POA: Insufficient documentation

## 2016-10-23 DIAGNOSIS — R1031 Right lower quadrant pain: Secondary | ICD-10-CM | POA: Diagnosis present

## 2016-10-23 LAB — URINALYSIS, ROUTINE W REFLEX MICROSCOPIC
Bilirubin Urine: NEGATIVE
Glucose, UA: NEGATIVE mg/dL
Hgb urine dipstick: NEGATIVE
Ketones, ur: NEGATIVE mg/dL
Nitrite: NEGATIVE
PH: 7 (ref 5.0–8.0)
Protein, ur: NEGATIVE mg/dL
SPECIFIC GRAVITY, URINE: 1.018 (ref 1.005–1.030)

## 2016-10-23 LAB — COMPREHENSIVE METABOLIC PANEL
ALT: 35 U/L (ref 14–54)
ANION GAP: 7 (ref 5–15)
AST: 48 U/L — ABNORMAL HIGH (ref 15–41)
Albumin: 3.9 g/dL (ref 3.5–5.0)
Alkaline Phosphatase: 79 U/L (ref 38–126)
BUN: 9 mg/dL (ref 6–20)
CHLORIDE: 103 mmol/L (ref 101–111)
CO2: 28 mmol/L (ref 22–32)
Calcium: 9 mg/dL (ref 8.9–10.3)
Creatinine, Ser: 0.71 mg/dL (ref 0.44–1.00)
GFR calc non Af Amer: >60 mL/min (ref >60)
GLUCOSE: 89 mg/dL (ref 65–99)
Potassium: 4 mmol/L (ref 3.5–5.1)
SODIUM: 138 mmol/L (ref 135–145)
TOTAL PROTEIN: 8.8 g/dL — AB (ref 6.5–8.1)
Total Bilirubin: 0.5 mg/dL (ref 0.3–1.2)

## 2016-10-23 LAB — WET PREP, GENITAL
CLUE CELLS WET PREP: NONE SEEN
Sperm: NONE SEEN
TRICH WET PREP: NONE SEEN
YEAST WET PREP: NONE SEEN

## 2016-10-23 LAB — CBC
HEMATOCRIT: 40.8 % (ref 36.0–46.0)
HEMOGLOBIN: 13.5 g/dL (ref 12.0–15.0)
MCH: 27.3 pg (ref 26.0–34.0)
MCHC: 33.1 g/dL (ref 30.0–36.0)
MCV: 82.6 fL (ref 78.0–100.0)
PLATELETS: 316 10*3/uL (ref 150–400)
RBC: 4.94 MIL/uL (ref 3.87–5.11)
RDW: 12.9 % (ref 11.5–15.5)
WBC: 9.5 10*3/uL (ref 4.0–10.5)

## 2016-10-23 LAB — LIPASE, BLOOD: LIPASE: 17 U/L (ref 11–51)

## 2016-10-23 LAB — I-STAT BETA HCG BLOOD, ED (MC, WL, AP ONLY): I-stat hCG, quantitative: <5 m[IU]/mL (ref <5)

## 2016-10-23 MED ORDER — IOPAMIDOL (ISOVUE-300) INJECTION 61%
INTRAVENOUS | Status: AC
  Filled 2016-10-23: qty 100

## 2016-10-23 MED ORDER — MORPHINE SULFATE (PF) 4 MG/ML IV SOLN
4.0000 mg | Freq: Once | INTRAVENOUS | Status: AC
  Administered 2016-10-23: 4 mg via INTRAVENOUS
  Filled 2016-10-23: qty 1

## 2016-10-23 MED ORDER — IOPAMIDOL (ISOVUE-300) INJECTION 61%
100.0000 mL | Freq: Once | INTRAVENOUS | Status: AC | PRN
  Administered 2016-10-23: 100 mL via INTRAVENOUS

## 2016-10-23 NOTE — Progress Notes (Signed)
   Subjective:   Patient ID: Anita Hunter    DOB: [birth year], 26 y.o. female   MRN: [id number]  CC: "RLQ pain"  HPI: Anita Hunter is a 26 y.o. female who presents to clinic today for RLQ pain. Problems discussed today are as follows:  RLQ pain: Patient recently treated for a positive chlamydia currently on doxycycline day 3 of 10. Patient also received IM dose of Rocephin and Flagyl for BV. Patient experiencing right lower quadrant pain 3 days after menstruation with sharp pain concerning for appendicitis prompting visit to UC. Patient was redirected to ED for concerns of appendicitis which was ruled out by CT however there were findings for a right ovarian complex cyst measuring 3 cm x 2 cm x 2.6 cm with CT findings concerning for possible mesenteric adenitis. Patient taking ibuprofen 800 mg every 6 hours as needed for pain with minimal improvement. Patient also endorsing nausea and looser stools since taking antibiotics. Patient endorses use of doxycycline with meals. Patient believes she knows who she contracted chlamydia from and will inform the partner. ROS: Denies fevers or chills, vomiting, chest pain, shortness of breath, diarrhea or constipation, melena or hematochezia.  Complete ROS performed, see HPI for pertinent.  Tangent: Morbid obesity, tobacco use disorder, OSA. Surgical history none. Family history HTN, migraines. Smoking status reviewed. Medications reviewed.  Objective:   BP 132/82   Pulse 95   Temp 98 F (36.7 C) (Oral)   Ht 5' 9"  (1.753 m)   Wt (!) 352 lb (159.7 kg)   LMP 10/10/2016   SpO2 96%   BMI 51.98 kg/m  Vitals and nursing note reviewed.  General: morbidly obese, well nourished, well developed, in no acute distress with non-toxic appearance HEENT: normocephalic, atraumatic, moist mucous membranes CV: regular rate and rhythm without murmurs, rubs, or gallops, no lower extremity edema Lungs: clear to auscultation bilaterally with normal work of  breathing Abdomen: soft, mildly tender in RLQ without rebound or guarding, non-distended, no masses or organomegaly palpable, normoactive bowel sounds Skin: warm, dry, no rashes or lesions, cap refill < 2 seconds Extremities: warm and well perfused, normal tone  Assessment & Plan:   RLQ abdominal pain Acute. Likely secondary to either complex ovarian cyst on the right versus mesenteric adenitis. No signs of acute abdomen on exam. Patient well appearing. --Reassured patient ovarian cyst is not concerning in symptoms are self limiting --RTC in one week if symptoms do not improve, otherwise follow-up in one month for health maintenance  Chlamydia Acute. Currently completing course of doxycycline. Patient will alert partner. --Instructed patient to complete ten-day course of doxycycline 100 mg twice daily --Discussed safe sex practices and avoidance of STD and HIV  No orders of the defined types were placed in this encounter.  No orders of the defined types were placed in this encounter.   Harriet Butte, [place], PGY-2 10/24/2016 9:14 AM

## 2016-10-23 NOTE — ED Provider Notes (Signed)
[facility] DEPT Provider Note   CSN: [id number] Arrival date & time: 10/23/16  1355     History   Chief Complaint Chief Complaint  Patient presents with  . Abdominal Pain    HPI Anita Hunter is a 26 y.o. female.  The history is provided by the patient and medical records. No language interpreter was used.  Abdominal Pain   Pertinent negatives include fever, diarrhea, nausea, vomiting and constipation.   Anita Hunter is a 26 y.o. female  with a PMH of eczema, sleep apnea who presents to the Emergency Department from urgent care concerned for appendicitis. Patient states that yesterday she had generalized abdominal pain which she thought was gas. When she awoke this morning, she had sharp, constant RLQ pain which will get worse with certain movements. Associated with a little nausea, no vomiting. No fevers. She was seen in ED on 9/06 at [facility] for UTI symptoms and was started on Keflex. G&C was obtained. She was called and notified of + chlaymdia results. Seen in ED and given 250 mg Rocephin and started on doxycycline and flagyl which she has been taking as directed. She states that her dysuria and vaginal discharge have both resolved. No pelvic pain or urinary symptoms. She believes that pain today feels very different from previous in the past week.   Past Medical History:  Diagnosis Date  . Back pain   . Eczema   . Seasonal allergies   . Sleep apnea     Patient Active Problem List   Diagnosis Date Noted  . Obstructive sleep apnea 08/27/2016  . Back pain 06/27/2016  . Prediabetes 03/02/2015  . Sinusitis, chronic 03/25/2014  . Tobacco abuse 06/14/2012  . Knee pain, left 06/13/2012  . MORBID OBESITY 04/11/2006  . Eczema 04/11/2006    Past Surgical History:  Procedure Laterality Date  . NO PAST SURGERIES      OB History    Gravida Para Term Preterm AB Living   0 0 0 0 0 0   SAB TAB Ectopic Multiple Live Births   0 0 0 0 0       Home Medications     Prior to Admission medications   Medication Sig Start Date End Date Taking? Authorizing Provider  diclofenac (VOLTAREN) 50 MG EC tablet Take 1 tablet (50 mg total) by mouth 2 (two) times daily. Patient taking differently: Take 50 mg by mouth 2 (two) times daily as needed for mild pain or moderate pain.  06/24/16  Yes Neese, Hope M, NP  doxycycline (VIBRAMYCIN) 100 MG capsule Take 1 capsule (100 mg total) by mouth 2 (two) times daily. 10/21/16  Yes Charlesetta Shanks, MD  famotidine (PEPCID) 20 MG tablet Take 1 tablet (20 mg total) by mouth 2 (two) times daily. 10/21/16  Yes Charlesetta Shanks, MD  ibuprofen (ADVIL,MOTRIN) 800 MG tablet Take 1 tablet (800 mg total) by mouth 3 (three) times daily. 10/21/16  Yes Charlesetta Shanks, MD  methocarbamol (ROBAXIN) 500 MG tablet Take 1 tablet (500 mg total) by mouth 2 (two) times daily. Patient taking differently: Take 500 mg by mouth 2 (two) times daily as needed for muscle spasms.  06/24/16  Yes Neese, Hope M, NP  metroNIDAZOLE (FLAGYL) 500 MG tablet Take 1 tablet (500 mg total) by mouth 2 (two) times daily. 10/21/16  Yes Charlesetta Shanks, MD  phenazopyridine (PYRIDIUM) 95 MG tablet Take 1 tablet (95 mg total) by mouth 3 (three) times daily as needed for pain. Patient not taking: Reported  on 10/23/2016 10/17/16   Carmin Muskrat, MD    Family History Family History  Problem Relation Age of Onset  . Hypertension Father   . Migraines Father   . Asthma Brother        No formal diagnosis as of 06/13/2012  . Diabetes Maternal Grandmother   . Hypertension Maternal Grandmother   . Hypertension Paternal Grandmother   . Fibroids Other        Uncertain which family member(s)    Social History Social History  Substance Use Topics  . Smoking status: Never Smoker  . Smokeless tobacco: Never Used  . Alcohol use No     Allergies   Shellfish allergy   Review of Systems Review of Systems  Constitutional: Negative for fever.  Gastrointestinal: Positive for abdominal  pain. Negative for blood in stool, constipation, diarrhea, nausea and vomiting.  All other systems reviewed and are negative.    Physical Exam Updated Vital Signs BP (!) 123/59   Pulse 85   Temp 98.5 F (36.9 C) (Oral)   Resp 18   Ht 5' 9"  (1.753 m)   Wt (!) 159.7 kg (352 lb)   LMP 10/10/2016   SpO2 96%   BMI 51.98 kg/m   Physical Exam  Constitutional: She is oriented to person, place, and time. She appears well-developed and well-nourished. No distress.  Non-toxic appearing.  HENT:  Head: Normocephalic and atraumatic.  Cardiovascular: Normal rate, regular rhythm and normal heart sounds.   No murmur heard. Pulmonary/Chest: Effort normal and breath sounds normal. No respiratory distress.  Abdominal: Soft. She exhibits no distension. There is tenderness (RLQ).  Genitourinary:  Genitourinary Comments: Chaperone present for exam. + white discharge. No cervical motion or adnexal tenderness. Body habitus limiting exam.   Neurological: She is alert and oriented to person, place, and time.  Skin: Skin is warm and dry.  Nursing note and vitals reviewed.    ED Treatments / Results  Labs (all labs ordered are listed, but only abnormal results are displayed) Labs Reviewed  WET PREP, GENITAL - Abnormal; Notable for the following:       Result Value   WBC, Wet Prep HPF POC MODERATE (*)    All other components within normal limits  COMPREHENSIVE METABOLIC PANEL - Abnormal; Notable for the following:    Total Protein 8.8 (*)    AST 48 (*)    All other components within normal limits  URINALYSIS, ROUTINE W REFLEX MICROSCOPIC - Abnormal; Notable for the following:    Leukocytes, UA SMALL (*)    Bacteria, UA RARE (*)    Squamous Epithelial / LPF 0-5 (*)    All other components within normal limits  LIPASE, BLOOD  CBC  I-STAT BETA HCG BLOOD, ED ([facility], [facility], [facility] ONLY)  GC/CHLAMYDIA PROBE AMP ([place]) NOT AT [facility]    EKG  EKG Interpretation None       Radiology US  Transvaginal Non-ob  Result Date: 10/23/2016 CLINICAL DATA:  Initial evaluation for acute right lower quadrant pain. EXAM: TRANSABDOMINAL AND TRANSVAGINAL ULTRASOUND OF PELVIS DOPPLER ULTRASOUND OF OVARIES TECHNIQUE: Both transabdominal and transvaginal ultrasound examinations of the pelvis were performed. Transabdominal technique was performed for global imaging of the pelvis including uterus, ovaries, adnexal regions, and pelvic cul-de-sac. It was necessary to proceed with endovaginal exam following the transabdominal exam to visualize the uterus and ovaries. Color and duplex Doppler ultrasound was utilized to evaluate blood flow to the ovaries. COMPARISON:  Prior CT from earlier the same day.  FINDINGS: Uterus Measurements: 7.8 x 3.8 x 5.1 cm. No fibroids or other mass visualized. Endometrium Thickness: 11.0 mm.  No focal abnormality visualized. Right ovary Measurements: 4.6 x 3.5 x 5.0 cm. Complex cyst measuring 3.1 x 2.0 x 2.6 cm present. Left ovary Measurements: 4.2 x 3.2 x 3.4 cm. Simple anechoic cyst measuring 2.2 x 1.5 x 1.7 cm, most consistent with a normal physiologic cyst. Pulsed Doppler evaluation of both ovaries demonstrates normal low-resistance arterial and venous waveforms. Other findings No abnormal free fluid. IMPRESSION: 1. 3.1 cm mildly complex right ovarian cyst, most likely a normal physiologic cyst with internal debris or possibly a hemorrhagic cyst. No concerning features. 2. No other acute abnormality within the pelvis. No evidence for ovarian torsion. Electronically Signed   By: Jeannine Boga M.D.   On: 10/23/2016 22:45   US Pelvis Complete  Result Date: 10/23/2016 CLINICAL DATA:  Initial evaluation for acute right lower quadrant pain. EXAM: TRANSABDOMINAL AND TRANSVAGINAL ULTRASOUND OF PELVIS DOPPLER ULTRASOUND OF OVARIES TECHNIQUE: Both transabdominal and transvaginal ultrasound examinations of the pelvis were performed. Transabdominal technique was performed for global  imaging of the pelvis including uterus, ovaries, adnexal regions, and pelvic cul-de-sac. It was necessary to proceed with endovaginal exam following the transabdominal exam to visualize the uterus and ovaries. Color and duplex Doppler ultrasound was utilized to evaluate blood flow to the ovaries. COMPARISON:  Prior CT from earlier the same day. FINDINGS: Uterus Measurements: 7.8 x 3.8 x 5.1 cm. No fibroids or other mass visualized. Endometrium Thickness: 11.0 mm.  No focal abnormality visualized. Right ovary Measurements: 4.6 x 3.5 x 5.0 cm. Complex cyst measuring 3.1 x 2.0 x 2.6 cm present. Left ovary Measurements: 4.2 x 3.2 x 3.4 cm. Simple anechoic cyst measuring 2.2 x 1.5 x 1.7 cm, most consistent with a normal physiologic cyst. Pulsed Doppler evaluation of both ovaries demonstrates normal low-resistance arterial and venous waveforms. Other findings No abnormal free fluid. IMPRESSION: 1. 3.1 cm mildly complex right ovarian cyst, most likely a normal physiologic cyst with internal debris or possibly a hemorrhagic cyst. No concerning features. 2. No other acute abnormality within the pelvis. No evidence for ovarian torsion. Electronically Signed   By: Jeannine Boga M.D.   On: 10/23/2016 22:45   Ct Abdomen Pelvis W Contrast  Result Date: 10/23/2016 CLINICAL DATA:  Right lower quadrant pain x2 days EXAM: CT ABDOMEN AND PELVIS WITH CONTRAST TECHNIQUE: Multidetector CT imaging of the abdomen and pelvis was performed using the standard protocol following bolus administration of intravenous contrast. CONTRAST:  167m ISOVUE-300 IOPAMIDOL (ISOVUE-300) INJECTION 61% COMPARISON:  None. FINDINGS: Lower chest: No acute abnormality. Hepatobiliary: Hepatic steatosis. No hepatic mass. Normal gallbladder without stones. No biliary dilatation. Pancreas: Normal Spleen: Normal Adrenals/Urinary Tract: Adrenal glands are unremarkable. Kidneys are normal, without renal calculi, focal lesion, or hydronephrosis. Bladder is  unremarkable. Stomach/Bowel: Nondistended stomach with normal small bowel rotation. Normal-appearing appendix. No bowel obstruction. Moderate stool burden in the rectosigmoid. Vascular/Lymphatic: Normal sized aorta. Small mesenteric lymph nodes are seen near the root of the mesentery with 7 and 9 mm short axis right lower quadrant lymph nodes possibly representing mild mesenteric adenitis or a nonspecific lymphoproliferative disorder. Reproductive: 3.8 cm cyst associated with the right ovary. Smaller follicles noted of the left ovary. Unremarkable appearance of the uterus. Other: No abdominal wall hernia or abnormality. No abdominopelvic ascites. Musculoskeletal: Small central disc herniation L5-S1. No acute nor suspicious osseous abnormality. IMPRESSION: 1. Normal-appearing appendix. 2. Findings that may explain the patient's right lower  quadrant pain may include mildly prominent right lower quadrant lymph nodes suspicious for mesenteric adenitis or possibly the 3.8 cm right ovarian cyst without worrisome features. 3. Small central disc herniation L5-S1. 4. Hepatic steatosis. Electronically Signed   By: Ashley Royalty M.D.   On: 10/23/2016 20:53   [place] Art/ven Flow Abd Pelv Doppler  Result Date: 10/23/2016 CLINICAL DATA:  Initial evaluation for acute right lower quadrant pain. EXAM: TRANSABDOMINAL AND TRANSVAGINAL ULTRASOUND OF PELVIS DOPPLER ULTRASOUND OF OVARIES TECHNIQUE: Both transabdominal and transvaginal ultrasound examinations of the pelvis were performed. Transabdominal technique was performed for global imaging of the pelvis including uterus, ovaries, adnexal regions, and pelvic cul-de-sac. It was necessary to proceed with endovaginal exam following the transabdominal exam to visualize the uterus and ovaries. Color and duplex Doppler ultrasound was utilized to evaluate blood flow to the ovaries. COMPARISON:  Prior CT from earlier the same day. FINDINGS: Uterus Measurements: 7.8 x 3.8 x 5.1 cm. No fibroids  or other mass visualized. Endometrium Thickness: 11.0 mm.  No focal abnormality visualized. Right ovary Measurements: 4.6 x 3.5 x 5.0 cm. Complex cyst measuring 3.1 x 2.0 x 2.6 cm present. Left ovary Measurements: 4.2 x 3.2 x 3.4 cm. Simple anechoic cyst measuring 2.2 x 1.5 x 1.7 cm, most consistent with a normal physiologic cyst. Pulsed Doppler evaluation of both ovaries demonstrates normal low-resistance arterial and venous waveforms. Other findings No abnormal free fluid. IMPRESSION: 1. 3.1 cm mildly complex right ovarian cyst, most likely a normal physiologic cyst with internal debris or possibly a hemorrhagic cyst. No concerning features. 2. No other acute abnormality within the pelvis. No evidence for ovarian torsion. Electronically Signed   By: Jeannine Boga M.D.   On: 10/23/2016 22:45    Procedures Procedures (including critical care time)  Medications Ordered in ED Medications  iopamidol (ISOVUE-300) 61 % injection (not administered)  morphine 4 MG/ML injection 4 mg (4 mg Intravenous Given 10/23/16 1955)  iopamidol (ISOVUE-300) 61 % injection 100 mL (100 mLs Intravenous Contrast Given 10/23/16 2021)     Initial Impression / Assessment and Plan / ED Course  I have reviewed the triage vital signs and the nursing notes.  Pertinent labs & imaging results that were available during my care of the patient were reviewed by me and considered in my medical decision making (see chart for details).    Anita Hunter is a 26 y.o. female who presents to ED from urgent care for concerns of appendicitis / RLQ abdominal pain. On arrival, patient is afebrile, hemodynamically stable with tenderness to RLQ. Blood work reviewed and reassuring. White count negative.   CT reviewed:   IMPRESSION: 1. Normal-appearing appendix. 2. Findings that may explain the patient's right lower quadrant pain may include mildly prominent right lower quadrant lymph nodes suspicious for mesenteric adenitis or  possibly the 3.8 cm right ovarian cyst without worrisome features. 3. Small central disc herniation L5-S1. 4. Hepatic steatosis.  Patient has + chlamydia test on 9/06. She has been given 266m rocephin and currently on doxycyline which she has been taking as directed. Pelvic with no cervical motion or adnexal tenderness. Ultrasound shows right ovarian cyst. No TOA. Evaluation does not show pathology that would require ongoing emergent intervention or inpatient treatment. Patient is tolerating PO and pain controlled. Will have patient follow up with her PCP or OBGYN. Follow up care, home care instructions and return precautions discussed. All questions answered.   Patient discussed with Dr. AZenia Resideswho agrees with treatment plan.  Final Clinical Impressions(s) / ED Diagnoses   Final diagnoses:  Cyst of right ovary    New Prescriptions New Prescriptions   No medications on file     Ridley Schewe, Ozella Almond, PA-C 10/23/16 2304    Lacretia Leigh, MD 10/25/16 1342

## 2016-10-23 NOTE — ED Triage Notes (Signed)
Patient c/o RLQ abdominal pain x2 days. Sent from UC to r/o appendicitis. Reports recent Chlamydia diagnosis. Reports taking abx as prescribed. Denies urinary symptoms and vaginal discharge. Denies V/D.

## 2016-10-23 NOTE — Discharge Instructions (Signed)
It was my pleasure taking care of you today!   Ibuprofen as needed for pain.  Continue taking your antibiotics until completion.   Please follow up with your primary care provider of OBGYN. If you do not have a women's physician, you can call the women's clinic listed to schedule a follow up appointment.   Return to the Emergency Department if you develop any of the following symptoms:  The pain gets worse.  You have a fever.  You keep throwing up and can't keep fluids down.  You pass bloody or black tarry stools.  There is bright red blood in the stool. You do not seem to be getting better.  You have any questions or concerns.

## 2016-10-24 ENCOUNTER — Encounter: Payer: Self-pay | Admitting: Family Medicine

## 2016-10-24 ENCOUNTER — Ambulatory Visit (INDEPENDENT_AMBULATORY_CARE_PROVIDER_SITE_OTHER): Payer: BLUE CROSS/BLUE SHIELD | Admitting: Family Medicine

## 2016-10-24 VITALS — BP 132/82 | HR 95 | Temp 98.0°F | Ht 69.0 in | Wt 352.0 lb

## 2016-10-24 DIAGNOSIS — A749 Chlamydial infection, unspecified: Secondary | ICD-10-CM | POA: Insufficient documentation

## 2016-10-24 DIAGNOSIS — R1031 Right lower quadrant pain: Secondary | ICD-10-CM | POA: Diagnosis not present

## 2016-10-24 LAB — GC/CHLAMYDIA PROBE AMP (~~LOC~~) NOT AT ARMC
Chlamydia: POSITIVE — AB
NEISSERIA GONORRHEA: NEGATIVE

## 2016-10-24 NOTE — Patient Instructions (Signed)
Thank you for coming in to see [place] today. Please see below to review our plan for today's visit.  1. Complete your antibiotics including doxycycline and Flagyl. Please inform her partner that he has chlamydia and needs to be treated to prevent spreading the infection. It is important to use protection during intercourse to prevent STD and HIV. 2. He do have a enlarged ovarian cyst on the right side she did not show signs of cancer or torsion. This is reassuring and showed resolve on its own. 3. Continue taking ibuprofen 800 mg every 6 hours as needed for pain. She can also use a heating pad over the area. 4. I would like to see you in one month to address other health maintenance. Return to the clinic in 1 week if symptoms are not improving.  Please call the clinic at [phone] if your symptoms worsen or you have any concerns. It was my pleasure to see you. -- Harriet [place], [place], PGY-2

## 2016-10-24 NOTE — Assessment & Plan Note (Addendum)
Acute. Likely secondary to either complex ovarian cyst on the right versus mesenteric adenitis. No signs of acute abdomen on exam. Patient well appearing. --Reassured patient ovarian cyst is not concerning in symptoms are self limiting --RTC in one week if symptoms do not improve, otherwise follow-up in one month for health maintenance

## 2016-10-24 NOTE — Assessment & Plan Note (Addendum)
Acute. Currently completing course of doxycycline. Patient will alert partner. --Instructed patient to complete ten-day course of doxycycline 100 mg twice daily --Discussed safe sex practices and avoidance of STD and HIV

## 2016-11-06 ENCOUNTER — Telehealth: Payer: Self-pay | Admitting: Family Medicine

## 2016-11-06 ENCOUNTER — Ambulatory Visit (INDEPENDENT_AMBULATORY_CARE_PROVIDER_SITE_OTHER): Payer: BLUE CROSS/BLUE SHIELD | Admitting: Obstetrics & Gynecology

## 2016-11-06 ENCOUNTER — Encounter: Payer: Self-pay | Admitting: Obstetrics & Gynecology

## 2016-11-06 VITALS — BP 139/95 | HR 106 | Ht 69.0 in | Wt 349.7 lb

## 2016-11-06 DIAGNOSIS — A749 Chlamydial infection, unspecified: Secondary | ICD-10-CM

## 2016-11-06 NOTE — Progress Notes (Signed)
Patient ID: Anita Hunter, female   DOB: May 01, 1990, 27 y.o.   MRN: [id number]  Chief Complaint  Patient presents with  . Gynecologic Exam  f/u ED visit for PID  HPI Anita Hunter is a 26 y.o. female.  [id number] Patient's last menstrual period was 10/10/2016 (exact date). She was treated in ED for PID with positive chlamydia 9/6. No current partner. RLQ pain is improved HPI  Past Medical History:  Diagnosis Date  . Back pain   . Eczema   . Seasonal allergies   . Sleep apnea     Past Surgical History:  Procedure Laterality Date  . NO PAST SURGERIES      Family History  Problem Relation Age of Onset  . Hypertension Father   . Migraines Father   . Asthma Brother        No formal diagnosis as of 06/13/2012  . Diabetes Maternal Grandmother   . Hypertension Maternal Grandmother   . Hypertension Paternal Grandmother   . Fibroids Other        Uncertain which family member(s)    Social History Social History  Substance Use Topics  . Smoking status: Never Smoker  . Smokeless tobacco: Never Used  . Alcohol use No    Allergies  Allergen Reactions  . Shellfish Allergy Nausea Only    Current Outpatient Prescriptions  Medication Sig Dispense Refill  . diclofenac (VOLTAREN) 50 MG EC tablet Take 1 tablet (50 mg total) by mouth 2 (two) times daily. 15 tablet 0  . methocarbamol (ROBAXIN) 500 MG tablet Take 1 tablet (500 mg total) by mouth 2 (two) times daily. 20 tablet 0  . doxycycline (VIBRAMYCIN) 100 MG capsule Take 1 capsule (100 mg total) by mouth 2 (two) times daily. (Patient not taking: Reported on 11/06/2016) 20 capsule 0  . famotidine (PEPCID) 20 MG tablet Take 1 tablet (20 mg total) by mouth 2 (two) times daily. (Patient not taking: Reported on 10/24/2016) 30 tablet 0  . ibuprofen (ADVIL,MOTRIN) 800 MG tablet Take 1 tablet (800 mg total) by mouth 3 (three) times daily. (Patient not taking: Reported on 11/06/2016) 21 tablet 0  . metroNIDAZOLE (FLAGYL) 500 MG tablet Take  1 tablet (500 mg total) by mouth 2 (two) times daily. (Patient not taking: Reported on 11/06/2016) 14 tablet 0   No current facility-administered medications for this visit.     Review of Systems Review of Systems  Constitutional: Negative.   Respiratory: Negative.   Cardiovascular: Negative.   Gastrointestinal: Negative.   Genitourinary: Negative.     Blood pressure (!) 139/95, pulse (!) 106, height 5' 9"  (1.753 m), weight (!) 349 lb 11.2 oz (158.6 kg), last menstrual period 10/10/2016.  Physical Exam Physical Exam  Constitutional: She is oriented to person, place, and time. She appears well-developed.  obese  Cardiovascular: Normal rate.   Pulmonary/Chest: Effort normal.  Abdominal: Soft. She exhibits no distension. There is no tenderness. There is no rebound and no guarding.  Neurological: She is alert and oriented to person, place, and time.  Psychiatric: She has a normal mood and affect. Her behavior is normal.    Data Reviewed CLINICAL DATA:  Initial evaluation for acute right lower quadrant pain.  EXAM: TRANSABDOMINAL AND TRANSVAGINAL ULTRASOUND OF PELVIS  DOPPLER ULTRASOUND OF OVARIES  TECHNIQUE: Both transabdominal and transvaginal ultrasound examinations of the pelvis were performed. Transabdominal technique was performed for global imaging of the pelvis including uterus, ovaries, adnexal regions, and pelvic cul-de-sac.  It was necessary to proceed  with endovaginal exam following the transabdominal exam to visualize the uterus and ovaries. Color and duplex Doppler ultrasound was utilized to evaluate blood flow to the ovaries.  COMPARISON:  Prior CT from earlier the same day.  FINDINGS: Uterus  Measurements: 7.8 x 3.8 x 5.1 cm. No fibroids or other mass visualized.  Endometrium  Thickness: 11.0 mm.  No focal abnormality visualized.  Right ovary  Measurements: 4.6 x 3.5 x 5.0 cm. Complex cyst measuring 3.1 x 2.0 x 2.6 cm  present.  Left ovary  Measurements: 4.2 x 3.2 x 3.4 cm. Simple anechoic cyst measuring 2.2 x 1.5 x 1.7 cm, most consistent with a normal physiologic cyst.  Pulsed Doppler evaluation of both ovaries demonstrates normal low-resistance arterial and venous waveforms.  Other findings  No abnormal free fluid.  IMPRESSION: 1. 3.1 cm mildly complex right ovarian cyst, most likely a normal physiologic cyst with internal debris or possibly a hemorrhagic cyst. No concerning features. 2. No other acute abnormality within the pelvis. No evidence for ovarian torsion.   Electronically Signed   By: Jeannine Boga M.D.   On: 10/23/2016 22:45  Assessment    Benign appearing right ovarian cyst [place] repeat not required S/P treatment for PID as OP doing well, TOC today    Plan    TOC GC/Chlamydia F/U [facility] 6 mo for routine Gyn, pap       Emeterio Reeve 11/06/2016, 2:54 PM

## 2016-11-06 NOTE — Patient Instructions (Signed)
Chlamydia, Female Chlamydia is an STD (sexually transmitted disease). It is a bacterial infection that spreads (is contagious) through sexual contact. Chlamydia can occur in different areas of the body, including:  The tube that moves urine from the bladder out of the body (urethra).  The lower part of the uterus (cervix).  The throat.  The rectum.  This condition is not difficult to treat. However, if left untreated, chlamydia can lead to more serious health problems, including pelvic inflammatory disorder (PID). PID can increase your risk of not being able to have children (sterility). What are the causes? Chlamydia is caused by the bacteria Chlamydia trachomatis. It is passed from an infected partner during sexual activity. Chlamydia can spread through contact with the genitals, mouth, or rectum. What are the signs or symptoms? In some cases, there may not be any symptoms for this condition (asymptomatic), especially early in the infection. If symptoms develop, they may include:  Burning with urination.  Frequent urination.  Vaginal discharge.  Redness, soreness, and swelling (inflammation) of the rectum.  Bleeding or discharge from the rectum.  Abdominal pain.  Pain during sexual intercourse.  Bleeding between menstrual periods.  Itching, burning, or redness in the eyes, or discharge from the eyes.  How is this diagnosed? This condition may be diagnosed with:  Urine tests.  Swab tests. Depending on your symptoms, your health care provider may use a cotton swab to collect discharge from your vagina or rectum to test for the bacteria.  A pelvic exam.  How is this treated? This condition is treated with antibiotic medicines. If you are pregnant, certain types of antibiotics will need to be avoided. Follow these instructions at home: Medicines  Take over-the-counter and prescription medicines only as told by your health care provider.  Take your antibiotic medicine  as told by your health care provider. Do not stop taking the antibiotic even if you start to feel better. Sexual activity  Tell sexual partners about your infection. This includes any oral, anal, or vaginal sex partners you have had within 60 days of when your symptoms started. Sexual partners should also be treated, even if they have no signs of the disease.  Do not have sex until you and your sexual partners have completed treatment and your health care provider says it is okay. If your health care provider prescribed you a single dose treatment, wait 7 days after taking the treatment before having sex. General instructions  It is your responsibility to get your test results. Ask your health care provider, or the department performing the test, when your results will be ready.  Get plenty of rest.  Eat a healthy, well-balanced diet.  Drink enough fluids to keep your urine clear or pale yellow.  Keep all follow-up visits as told by your health care provider. This is important. You may need to be tested for infection again 3 months after treatment. How is this prevented? The only sure way to prevent chlamydia is to avoid having sex. However, you can lower your risk by:  Using latex condoms correctly every time you have sex.  Not having multiple sexual partners.  Asking if your sexual partner has been tested for STIs and had negative results.  Contact a health care provider if:  You develop new symptoms or your symptoms do not get better after completing treatment.  You have a fever or chills.  You have pain during sexual intercourse. Get help right away if:  Your pain gets worse and does  not get better with medicine.  You develop flu-like symptoms, such as night sweats, sore throat, or muscle aches.  You experience nausea or vomiting.  You have difficulty swallowing.  You have bleeding between periods or after sex.  You have irregular menstrual periods.  You have  abdominal or lower back pain that does not get better with medicine.  You feel weak or dizzy, or you faint.  You are pregnant and you develop symptoms of chlamydia. Summary  Chlamydia is an STD (sexually transmitted disease). It is a bacterial infection that spreads (is contagious) through sexual contact.  This condition is not difficult to treat, however. If left untreated, chlamydia can lead to more serious health problems, including pelvic inflammatory disease (PID).  In some cases, there may not be any symptoms for this condition (asymptomatic).  This condition is treated with antibiotic medicines.  Using latex condoms correctly every time you have sex can help prevent chlamydia. This information is not intended to replace advice given to you by your health care provider. Make sure you discuss any questions you have with your health care provider. Document Released: 11/08/2004 Document Revised: 01/16/2016 Document Reviewed: 01/16/2016 Elsevier Interactive Patient Education  2017 [employer].

## 2016-11-06 NOTE — Telephone Encounter (Signed)
Pt is calling to see if the doctor received the disability papers that were faxed last week. ?  She would also like to speak to the doctor ASAP. jw

## 2016-11-06 NOTE — Telephone Encounter (Signed)
Called pt back. Pt requesting note to file claim. Discussed need for formal evaluation. Has appt 10/12. Pt to call [facility] and schedule appt earlier than 10/12.

## 2016-11-07 LAB — CERVICOVAGINAL ANCILLARY ONLY
Chlamydia: NEGATIVE
NEISSERIA GONORRHEA: NEGATIVE

## 2016-11-12 NOTE — Progress Notes (Signed)
   Subjective:   Patient ID: Anita Hunter    DOB: 1990/11/10, 26 y.o. female   MRN: [id number]  CC: "RLQ pain"  HPI: Anita Hunter is a 26 y.o. female who presents to clinic today for RLQ pain. Problems discussed today are as follows:  RLQ pain: Patient states her right lower quadrant pain is resolved. She's been out of work since 10/16/2016 and would like a note to return back. She is also requesting disability due to an inability to perform strenuous activities including bending, lifting, or pulling. She states she is now able to do those things without difficulty and does not need accommodations. She was also recently treated with test of cure for chlamydia. ROS: Denies fevers or chills, nausea or vomiting, chest pain, shortness of breath, diarrhea, melena or hematochezia.  Complete ROS performed, see HPI for pertinent.  [place]: Morbid obesity, tobacco use disorder, OSA. Surgical history none. Family history HTN, migraines. Smoking status reviewed. Medications reviewed.  Objective:   BP 125/88   Pulse 96   Temp 98.3 F (36.8 C) (Oral)   Ht 5' 9"  (1.753 m)   Wt (!) 351 lb 12.8 oz (159.6 kg)   SpO2 96%   BMI 51.95 kg/m  Vitals and nursing note reviewed.  General: morbidly obese, in no acute distress with non-toxic appearance HEENT: normocephalic, atraumatic, moist mucous membranes Neck: supple, non-tender without lymphadenopathy CV: regular rate and rhythm without murmurs, rubs, or gallops, no lower extremity edema Lungs: clear to auscultation bilaterally with normal work of breathing Abdomen: soft, non-tender, non-distended, no masses or organomegaly palpable, normoactive bowel sounds Skin: warm, dry, no rashes or lesions, cap refill < 2 seconds Extremities: warm and well perfused, normal tone  Assessment & Plan:   RLQ abdominal pain Chronic. Now resolved. Asymptomatic as of today. Chlamydia with TOC. RLQ likely secondary to ovarian cyst. --Discussed red  flags --Disability form completed along with letter for patient to return to work with precautions --RTC 6 months for chronic medical managment  No orders of the defined types were placed in this encounter.  No orders of the defined types were placed in this encounter.   Harriet Butte, [place], PGY-2 11/13/2016 10:10 AM

## 2016-11-13 ENCOUNTER — Ambulatory Visit (INDEPENDENT_AMBULATORY_CARE_PROVIDER_SITE_OTHER): Payer: BLUE CROSS/BLUE SHIELD | Admitting: Family Medicine

## 2016-11-13 ENCOUNTER — Encounter: Payer: Self-pay | Admitting: Family Medicine

## 2016-11-13 DIAGNOSIS — R1031 Right lower quadrant pain: Secondary | ICD-10-CM

## 2016-11-13 DIAGNOSIS — Z23 Encounter for immunization: Secondary | ICD-10-CM

## 2016-11-13 NOTE — Assessment & Plan Note (Addendum)
Chronic. Now resolved. Asymptomatic as of today. Chlamydia with TOC. RLQ likely secondary to ovarian cyst. --Discussed red flags --Disability form completed along with letter for patient to return to work with precautions --RTC 6 months for chronic medical managment

## 2016-11-13 NOTE — Patient Instructions (Signed)
Thank you for coming in to see [place] today. Please see below to review our plan for today's visit.  I have provided you the form for you to return to work. We will work on faxing over the disability later today.  Please call the clinic at [phone] if your symptoms worsen or you have any concerns. It was my pleasure to see you. -- Harriet Butte, [place], PGY-2

## 2016-11-23 ENCOUNTER — Ambulatory Visit: Payer: BLUE CROSS/BLUE SHIELD | Admitting: Family Medicine

## 2017-07-06 ENCOUNTER — Encounter (HOSPITAL_BASED_OUTPATIENT_CLINIC_OR_DEPARTMENT_OTHER): Payer: Self-pay | Admitting: Emergency Medicine

## 2017-07-06 ENCOUNTER — Emergency Department (HOSPITAL_BASED_OUTPATIENT_CLINIC_OR_DEPARTMENT_OTHER)
Admission: EM | Admit: 2017-07-06 | Discharge: 2017-07-06 | Disposition: A | Discharge status: Home / Self Care | Payer: BLUE CROSS/BLUE SHIELD | Attending: Emergency Medicine | Admitting: Emergency Medicine

## 2017-07-06 ENCOUNTER — Other Ambulatory Visit: Payer: Self-pay

## 2017-07-06 DIAGNOSIS — R1013 Epigastric pain: Secondary | ICD-10-CM | POA: Diagnosis not present

## 2017-07-06 DIAGNOSIS — R1030 Lower abdominal pain, unspecified: Secondary | ICD-10-CM | POA: Insufficient documentation

## 2017-07-06 LAB — CBC WITH DIFFERENTIAL/PLATELET
Basophils Absolute: 0 10*3/uL (ref 0.0–0.1)
Basophils Relative: 0 %
EOS PCT: 1 %
Eosinophils Absolute: 0.1 10*3/uL (ref 0.0–0.7)
HCT: 37.5 % (ref 36.0–46.0)
Hemoglobin: 12 g/dL (ref 12.0–15.0)
LYMPHS ABS: 1.4 10*3/uL (ref 0.7–4.0)
LYMPHS PCT: 13 %
MCH: 27.8 pg (ref 26.0–34.0)
MCHC: 32 g/dL (ref 30.0–36.0)
MCV: 87 fL (ref 78.0–100.0)
MONO ABS: 0.6 10*3/uL (ref 0.1–1.0)
Monocytes Relative: 6 %
Neutro Abs: 8.2 10*3/uL — ABNORMAL HIGH (ref 1.7–7.7)
Neutrophils Relative %: 80 %
PLATELETS: 266 10*3/uL (ref 150–400)
RBC: 4.31 MIL/uL (ref 3.87–5.11)
RDW: 13 % (ref 11.5–15.5)
WBC: 10.4 10*3/uL (ref 4.0–10.5)

## 2017-07-06 LAB — COMPREHENSIVE METABOLIC PANEL
ALT: 48 U/L (ref 14–54)
AST: 57 U/L — AB (ref 15–41)
Albumin: 3.6 g/dL (ref 3.5–5.0)
Alkaline Phosphatase: 75 U/L (ref 38–126)
Anion gap: 8 (ref 5–15)
BUN: 12 mg/dL (ref 6–20)
CHLORIDE: 101 mmol/L (ref 101–111)
CO2: 26 mmol/L (ref 22–32)
CREATININE: 0.75 mg/dL (ref 0.44–1.00)
Calcium: 8.3 mg/dL — ABNORMAL LOW (ref 8.9–10.3)
GFR calc Af Amer: >60 mL/min (ref >60)
GFR calc non Af Amer: >60 mL/min (ref >60)
Glucose, Bld: 117 mg/dL — ABNORMAL HIGH (ref 65–99)
Potassium: 3.8 mmol/L (ref 3.5–5.1)
SODIUM: 135 mmol/L (ref 135–145)
Total Bilirubin: 0.5 mg/dL (ref 0.3–1.2)
Total Protein: 7.5 g/dL (ref 6.5–8.1)

## 2017-07-06 LAB — URINALYSIS, ROUTINE W REFLEX MICROSCOPIC

## 2017-07-06 LAB — WET PREP, GENITAL
Clue Cells Wet Prep HPF POC: NONE SEEN
SPERM: NONE SEEN
Trich, Wet Prep: NONE SEEN
YEAST WET PREP: NONE SEEN

## 2017-07-06 LAB — URINALYSIS, MICROSCOPIC (REFLEX): RBC / HPF: >50 RBC/hpf (ref 0–5)

## 2017-07-06 LAB — PREGNANCY, URINE: PREG TEST UR: NEGATIVE

## 2017-07-06 LAB — LIPASE, BLOOD: Lipase: 22 U/L (ref 11–51)

## 2017-07-06 MED ORDER — ONDANSETRON HCL 4 MG PO TABS: 4.0000 mg | ORAL_TABLET | Freq: Three times a day (TID) | ORAL | 0 refills | Status: DC | PRN

## 2017-07-06 MED ORDER — NAPROXEN 500 MG PO TABS: 500.0000 mg | ORAL_TABLET | Freq: Two times a day (BID) | ORAL | 0 refills | Status: DC

## 2017-07-06 MED ORDER — DICYCLOMINE HCL 20 MG PO TABS: 20.0000 mg | ORAL_TABLET | Freq: Two times a day (BID) | ORAL | 0 refills | Status: DC | PRN

## 2017-07-06 NOTE — ED Notes (Signed)
Pt made aware of lab's request for more urine for the culture.

## 2017-07-06 NOTE — ED Provider Notes (Signed)
[facility] EMERGENCY DEPARTMENT Provider Note   CSN: [id number] Arrival date & time: 07/06/17  9179     History   Chief Complaint Chief Complaint  Patient presents with  . Abdominal Pain  . Pelvic Pain    HPI Anita Hunter is a 27 y.o. female.  The history is provided by the patient. No language interpreter was used.  Abdominal Pain    Pelvic Pain  Associated symptoms include abdominal pain.   Anita Hunter is a 27 y.o. female who presents to the Emergency Department complaining of abdominal pain. She presents for evaluation of both epigastric and lower abdominal pain. Her lower abdominal pain is in the suprapubic region and started about 10 days ago. This described as crampy in nature and she states it feels like a UTI but she has no dysuria. Her cycle started right around when the cramping began and she notes that it has been longer and heavier than usual. LMP was about a month before it was light for her. About two days ago she began to develop epigastric abdominal discomfort described as a squeezing and tightness sensation. She has associated nausea. Denies fevers, vomiting, diarrhea, magical discharge. She is not sexually active. No prior similar symptoms. Past Medical History:  Diagnosis Date  . Back pain   . Eczema   . Seasonal allergies   . Sleep apnea     Patient Active Problem List   Diagnosis Date Noted  . RLQ abdominal pain 10/24/2016  . Obstructive sleep apnea 08/27/2016  . Back pain 06/27/2016  . Prediabetes 03/02/2015  . Sinusitis, chronic 03/25/2014  . Tobacco use disorder 06/14/2012  . Knee pain, left 06/13/2012  . Morbid obesity with BMI of 50.0-59.9, adult ([place]) 04/11/2006  . Eczema 04/11/2006    Past Surgical History:  Procedure Laterality Date  . NO PAST SURGERIES       OB History    Gravida  0   Para  0   Term  0   Preterm  0   AB  0   Living  0     SAB  0   TAB  0   Ectopic  0   Multiple  0   Live  Births  0            Home Medications    Prior to Admission medications   Medication Sig Start Date End Date Taking? Authorizing Provider  diclofenac (VOLTAREN) 50 MG EC tablet Take 1 tablet (50 mg total) by mouth 2 (two) times daily. 06/24/16   Ashley Murrain, NP  dicyclomine (BENTYL) 20 MG tablet Take 1 tablet (20 mg total) by mouth 2 (two) times daily as needed for spasms. 07/06/17   Quintella Reichert, MD  famotidine (PEPCID) 20 MG tablet Take 1 tablet (20 mg total) by mouth 2 (two) times daily. Patient not taking: Reported on 10/24/2016 10/21/16   Charlesetta Shanks, MD  methocarbamol (ROBAXIN) 500 MG tablet Take 1 tablet (500 mg total) by mouth 2 (two) times daily. 06/24/16   Ashley Murrain, NP  naproxen (NAPROSYN) 500 MG tablet Take 1 tablet (500 mg total) by mouth 2 (two) times daily. 07/06/17   Quintella Reichert, MD  ondansetron (ZOFRAN) 4 MG tablet Take 1 tablet (4 mg total) by mouth every 8 (eight) hours as needed for nausea or vomiting. 07/06/17   Quintella Reichert, MD    Family History Family History  Problem Relation Age of Onset  . Hypertension Father   .  Migraines Father   . Asthma Brother        No formal diagnosis as of 06/13/2012  . Diabetes Maternal Grandmother   . Hypertension Maternal Grandmother   . Hypertension Paternal Grandmother   . Fibroids Other        Uncertain which family member(s)    Social History Social History   Tobacco Use  . Smoking status: Never Smoker  . Smokeless tobacco: Never Used  Substance Use Topics  . Alcohol use: No  . Drug use: No    Types: Marijuana    Comment: marijuna tea     Allergies   Shellfish allergy   Review of Systems Review of Systems  Gastrointestinal: Positive for abdominal pain.  Genitourinary: Positive for pelvic pain.  All other systems reviewed and are negative.    Physical Exam Updated Vital Signs BP 120/80 (BP Location: Right Arm)   Pulse 88   Temp 98.2 F (36.8 C) (Oral)   Resp 18   Ht 5' 9"  (1.753 m)    Wt (!) 164.6 kg (362 lb 12.8 oz)   LMP 07/06/2017   SpO2 100%   BMI 53.58 kg/m   Physical Exam  Constitutional: She is oriented to person, place, and time. She appears well-developed and well-nourished.  HENT:  Head: Normocephalic and atraumatic.  Cardiovascular: Normal rate and regular rhythm.  No murmur heard. Pulmonary/Chest: Effort normal and breath sounds normal. No respiratory distress.  Abdominal: Soft. There is no rebound and no guarding.  Mild generalized abdominal tenderness without guarding/rebound.   Genitourinary:  Genitourinary Comments: Mild to moderate vaginal bleeding. No CMT or adnexal tenderness.  Musculoskeletal: She exhibits no edema or tenderness.  Neurological: She is alert and oriented to person, place, and time.  Skin: Skin is warm and dry.  Psychiatric: She has a normal mood and affect. Her behavior is normal.  Nursing note and vitals reviewed.    ED Treatments / Results  Labs (all labs ordered are listed, but only abnormal results are displayed) Labs Reviewed  WET PREP, GENITAL - Abnormal; Notable for the following components:      Result Value   WBC, Wet Prep HPF POC FEW (*)    All other components within normal limits  URINALYSIS, ROUTINE W REFLEX MICROSCOPIC - Abnormal; Notable for the following components:   Color, Urine RED (*)    APPearance HAZY (*)    Glucose, UA   (*)    Value: TEST NOT REPORTED DUE TO COLOR INTERFERENCE OF URINE PIGMENT   Hgb urine dipstick   (*)    Value: TEST NOT REPORTED DUE TO COLOR INTERFERENCE OF URINE PIGMENT   Bilirubin Urine   (*)    Value: TEST NOT REPORTED DUE TO COLOR INTERFERENCE OF URINE PIGMENT   Ketones, ur   (*)    Value: TEST NOT REPORTED DUE TO COLOR INTERFERENCE OF URINE PIGMENT   Protein, ur   (*)    Value: TEST NOT REPORTED DUE TO COLOR INTERFERENCE OF URINE PIGMENT   Nitrite   (*)    Value: TEST NOT REPORTED DUE TO COLOR INTERFERENCE OF URINE PIGMENT   Leukocytes, UA   (*)    Value: TEST NOT  REPORTED DUE TO COLOR INTERFERENCE OF URINE PIGMENT   All other components within normal limits  COMPREHENSIVE METABOLIC PANEL - Abnormal; Notable for the following components:   Glucose, Bld 117 (*)    Calcium 8.3 (*)    AST 57 (*)    All other components  within normal limits  CBC WITH DIFFERENTIAL/PLATELET - Abnormal; Notable for the following components:   Neutro Abs 8.2 (*)    All other components within normal limits  URINALYSIS, MICROSCOPIC (REFLEX) - Abnormal; Notable for the following components:   Bacteria, UA MANY (*)    All other components within normal limits  URINE CULTURE  PREGNANCY, URINE  LIPASE, BLOOD  RPR  HIV ANTIBODY (ROUTINE TESTING)  GC/CHLAMYDIA PROBE AMP ([place]) NOT AT [facility]    EKG None  Radiology No results found.  Procedures Procedures (including critical care time)  Medications Ordered in ED Medications - No data to display   Initial Impression / Assessment and Plan / ED Course  I have reviewed the triage vital signs and the nursing notes.  Pertinent labs & imaging results that were available during my care of the patient were reviewed by me and considered in my medical decision making (see chart for details).     Patient here for evaluation of lower abdominal pain as well as epigastric pain. She has minimal tenderness on examination, non-toxic appearing. Pelvic examination does have special bleeding but no evidence of hemorrhage. Presentation is not consistent with PID, TOA or ovarian torsion. Examination and history is not consistent with acute appendicitis, cholecystitis. Discussed with patient home care for menorrhagia and lower abdominal pain. Discussed home care, outpatient follow-up and return precautions.  Final Clinical Impressions(s) / ED Diagnoses   Final diagnoses:  Lower abdominal pain    ED Discharge Orders        Ordered    naproxen (NAPROSYN) 500 MG tablet  2 times daily     07/06/17 1556    ondansetron (ZOFRAN) 4  MG tablet  Every 8 hours PRN     07/06/17 1556    dicyclomine (BENTYL) 20 MG tablet  2 times daily PRN     07/06/17 1556       Quintella Reichert, MD 07/06/17 1600

## 2017-07-06 NOTE — ED Triage Notes (Signed)
Patient states that she is having 2 types of pain " some on top and some on bottom" - patient states that she is having a burning cramping pain to her epigastric region that has started 2 days ago. She states that the pain come and she "feels like I have to go to the bathroom but nothing to very little comes out" - the patient also states that she is having a burning with urination and cramping pain x at least a week to her lower pelvic region. She feels like this is a "UTI" - the patient reports that she is also having a longer than usual period this month, but denies having a "normal" length period in April. Patient denies any dizziness or lightheaded feelings.

## 2017-07-06 NOTE — ED Notes (Signed)
Pt given Rx x 3 for Bentyl, Naproxen and Zofran

## 2017-07-06 NOTE — ED Notes (Signed)
Pt unable to provide a urine sample.

## 2017-07-06 NOTE — ED Notes (Signed)
ED Provider at bedside. 

## 2017-07-07 LAB — HIV ANTIBODY (ROUTINE TESTING W REFLEX): HIV Screen 4th Generation wRfx: NONREACTIVE

## 2017-07-07 LAB — URINE CULTURE

## 2017-07-07 LAB — RPR: RPR: NONREACTIVE

## 2017-07-09 LAB — GC/CHLAMYDIA PROBE AMP (~~LOC~~) NOT AT ARMC
Chlamydia: NEGATIVE
NEISSERIA GONORRHEA: NEGATIVE

## 2017-07-26 ENCOUNTER — Encounter (HOSPITAL_COMMUNITY): Payer: Self-pay | Admitting: Family Medicine

## 2017-07-26 ENCOUNTER — Ambulatory Visit (HOSPITAL_COMMUNITY)
Admission: EM | Admit: 2017-07-26 | Discharge: 2017-07-26 | Disposition: A | Discharge status: Home / Self Care | Payer: BLUE CROSS/BLUE SHIELD | Attending: Emergency Medicine | Admitting: Emergency Medicine

## 2017-07-26 DIAGNOSIS — L739 Follicular disorder, unspecified: Secondary | ICD-10-CM | POA: Diagnosis not present

## 2017-07-26 NOTE — ED Provider Notes (Signed)
[facility]    CSN: [id number] Arrival date & time: 07/26/17  1239     History   Chief Complaint Chief Complaint  Patient presents with  . Head Injury    HPI Anita Hunter is a 27 y.o. female.   Pt states that she had her hair done on last tues and then noticed an bump to the lt side of her hair. Did not see anything but it was causing a little pain and some swelling. Denies any fevers. States that she has had a hair "bump" before and did not know if this was the same. Has not taken anything pta.      Past Medical History:  Diagnosis Date  . Back pain   . Eczema   . Seasonal allergies   . Sleep apnea     Patient Active Problem List   Diagnosis Date Noted  . RLQ abdominal pain 10/24/2016  . Obstructive sleep apnea 08/27/2016  . Back pain 06/27/2016  . Prediabetes 03/02/2015  . Sinusitis, chronic 03/25/2014  . Tobacco use disorder 06/14/2012  . Knee pain, left 06/13/2012  . Morbid obesity with BMI of 50.0-59.9, adult ([place]) 04/11/2006  . Eczema 04/11/2006    Past Surgical History:  Procedure Laterality Date  . NO PAST SURGERIES      OB History    Gravida  0   Para  0   Term  0   Preterm  0   AB  0   Living  0     SAB  0   TAB  0   Ectopic  0   Multiple  0   Live Births  0            Home Medications    Prior to Admission medications   Medication Sig Start Date End Date Taking? Authorizing Provider  famotidine (PEPCID) 20 MG tablet Take 1 tablet (20 mg total) by mouth 2 (two) times daily. Patient not taking: Reported on 10/24/2016 10/21/16   Charlesetta Shanks, MD  methocarbamol (ROBAXIN) 500 MG tablet Take 1 tablet (500 mg total) by mouth 2 (two) times daily. 06/24/16   Ashley Murrain, NP    Family History Family History  Problem Relation Age of Onset  . Hypertension Father   . Migraines Father   . Asthma Brother        No formal diagnosis as of 06/13/2012  . Diabetes Maternal Grandmother   . Hypertension Maternal  Grandmother   . Hypertension Paternal Grandmother   . Fibroids Other        Uncertain which family member(s)    Social History Social History   Tobacco Use  . Smoking status: Never Smoker  . Smokeless tobacco: Never Used  Substance Use Topics  . Alcohol use: No  . Drug use: No    Types: Marijuana    Comment: marijuna tea     Allergies   Shellfish allergy   Review of Systems Review of Systems  HENT:       Bump/ swelling to LT lateral side of head.   Respiratory: Negative.   Cardiovascular: Negative.   Gastrointestinal: Negative.   Skin:       Swelling   Neurological: Negative.      Physical Exam Triage Vital Signs ED Triage Vitals  Enc Vitals Group     BP 07/26/17 1305 139/68     Pulse Rate 07/26/17 1305 95     Resp 07/26/17 1305 18  Temp 07/26/17 1305 98.2 F (36.8 C)     Temp src --      SpO2 07/26/17 1305 100 %     Weight --      Height --      Head Circumference --      Peak Flow --      Pain Score 07/26/17 1304 4     Pain Loc --      Pain Edu? --      Excl. in [place]? --    No data found.  Updated Vital Signs BP 139/68   Pulse 95   Temp 98.2 F (36.8 C)   Resp 18   LMP 07/06/2017   SpO2 100%   Visual Acuity Right Eye Distance:   Left Eye Distance:   Bilateral Distance:    Right Eye Near:   Left Eye Near:    Bilateral Near:     Physical Exam  HENT:  LT side of head above ear small amount of edema , no erythema, no drainage, no edema to face or ear pain,.   Eyes: Pupils are equal, round, and reactive to light.  Neck: Normal range of motion.  Cardiovascular: Normal rate.  Pulmonary/Chest: Effort normal.  Skin: Skin is warm.  Slight amount of edema near hair follicle      UC Treatments / Results  Labs (all labs ordered are listed, but only abnormal results are displayed) Labs Reviewed - No data to display  EKG None  Radiology No results found.  Procedures Procedures (including critical care time)  Medications  Ordered in UC Medications - No data to display  Initial Impression / Assessment and Plan / UC Course  I have reviewed the triage vital signs and the nursing notes.  Pertinent labs & imaging results that were available during my care of the patient were reviewed by me and considered in my medical decision making (see chart for details).     Monitor area  May use warm compresses  It appears that it maybe a hair follicle At this time you do not need an antibiotic Can use motrin to help with pain   Final Clinical Impressions(s) / UC Diagnoses   Final diagnoses:  None   Discharge Instructions   None    ED Prescriptions    None     Controlled Substance Prescriptions [place] Controlled Substance Registry consulted? Not Applicable   Marney Setting, NP 07/26/17 1350

## 2017-07-26 NOTE — ED Triage Notes (Addendum)
Pt here for knot to the top of her head. She noticed it Tuesday. sts slight pain in that area. She is unsure of how this occurred but reports that she may have hit her head on something or dropped something on head a week ago. Denies dizziness, blurred vision. sts some pressure in left eye.

## 2017-07-26 NOTE — Discharge Instructions (Addendum)
Monitor area  May use warm compresses  It appears that it maybe a hair follicle At this time you do not need an antibiotic Can use motrin to help with pain

## 2017-07-29 ENCOUNTER — Ambulatory Visit: Payer: BLUE CROSS/BLUE SHIELD | Admitting: Family Medicine

## 2018-03-19 ENCOUNTER — Encounter: Payer: Self-pay | Admitting: Family Medicine

## 2018-03-19 ENCOUNTER — Ambulatory Visit (INDEPENDENT_AMBULATORY_CARE_PROVIDER_SITE_OTHER): Payer: BLUE CROSS/BLUE SHIELD | Admitting: Family Medicine

## 2018-03-19 VITALS — BP 140/80 | HR 97 | Temp 98.3°F | Wt 378.0 lb

## 2018-03-19 DIAGNOSIS — J302 Other seasonal allergic rhinitis: Secondary | ICD-10-CM | POA: Diagnosis not present

## 2018-03-19 DIAGNOSIS — G8929 Other chronic pain: Secondary | ICD-10-CM | POA: Insufficient documentation

## 2018-03-19 DIAGNOSIS — L2082 Flexural eczema: Secondary | ICD-10-CM

## 2018-03-19 DIAGNOSIS — Z6841 Body Mass Index (BMI) 40.0 and over, adult: Secondary | ICD-10-CM

## 2018-03-19 DIAGNOSIS — M545 Low back pain, unspecified: Secondary | ICD-10-CM

## 2018-03-19 DIAGNOSIS — Z23 Encounter for immunization: Secondary | ICD-10-CM | POA: Diagnosis not present

## 2018-03-19 MED ORDER — TRIAMCINOLONE ACETONIDE 0.1 % EX OINT: 1.0000 "application " | TOPICAL_OINTMENT | Freq: Two times a day (BID) | CUTANEOUS | 0 refills | Status: DC

## 2018-03-19 MED ORDER — FLUTICASONE PROPIONATE 50 MCG/ACT NA SUSP: 2.0000 | Freq: Every day | NASAL | 6 refills | Status: DC

## 2018-03-19 NOTE — Assessment & Plan Note (Signed)
Chronic.  Recent exacerbation due to allergies.  No facial involvement.  Mild in severity. - Refill triamcinolone ointment 0.1%

## 2018-03-19 NOTE — Assessment & Plan Note (Signed)
Chronic.  No red flags.  Does not seem to have radiculopathy.  Consistent with paraspinal strain mended to left side.  Does have a history of slipped disc at L4 based on MRI per patient report.  Suspect significant obesity is primary source for underlying back pain. - ROI signed to obtain records from [facility] and MRI - Ambulatory referral to PT - Discussed importance of weight loss, patient to consider bariatric surgery at next appointment - Flu and Tdap vaccines given

## 2018-03-19 NOTE — Patient Instructions (Signed)
Thank you for coming in to see [place] today. Please see below to review our plan for today's visit.  1.  I placed a referral for you to be seen by physical therapy.  They will contact you in the next few weeks to schedule the appointments.  It is important that you continue being active to avoid exacerbating your low back pain.  We will follow-up with you in 1 month. 2.  I called in a prescription for Flonase which you can take 2 sprays in each nostril until your symptoms resolve at which point you can decrease to 1 spray in each nostril.  This will help prevent future episodes of allergies. 3.  It appears that you were taking triamcinolone ointment 0.1% in the past.  I called in a refill for this ointment which she can apply to your eczema. 4.  You are now up-to-date on your vaccines.  We will see you again in 1-2 months for a annual physical.  Please call the clinic at [phone] if your symptoms worsen or you have any concerns. It was our pleasure to serve you.  Harriet Butte, [place], PGY-3

## 2018-03-19 NOTE — Assessment & Plan Note (Signed)
Chronic.  Recent exacerbation causing eczema outbreak.  Not on antihistamine therapy.  No signs of secondary bacterial infection. - Initiating Flonase 2 sprays in each nare daily during exacerbation and decrease to 1 spray in each nare daily for control

## 2018-03-19 NOTE — Progress Notes (Signed)
Subjective   Patient ID: BETHAN ADAMEK    DOB: 02-11-1991, 28 y.o. female   MRN: [id number]  CC: "Low back pain"  HPI: TWANDA STAKES is a 28 y.o. female who presents to clinic today for the following:  Chronic left low back pain without sciatica: Patient with a longstanding history of low back pain which was exacerbated following an accident at work back in August 2018 when she got her foot caught in a palate resulting in her fall.  She followed [facility] after seeing a urgent care for [employer] patient does have a slipped disc on L4 based on her MRI during the work-up which is thought to be exacerbating the pain.  She states that the case has been closed and she continues to work but has restrictions with avoiding bending or lifting.  Patient is here today because she has persistent low left back pain.  She denies motor weakness, sensory loss, bowel or bladder incontinence.  She is not taking any medication at this time but is interested in following up with physical therapy which appeared to work for her in the past.  She has tried medications including tramadol, Flexeril, Celebrex, and prednisone without improvement in the past.  Seasonal allergies: Patient reports recent exacerbation due to the weather with symptoms of nasal congestion, itchy watery eyes, and scratchy throat.  She denies any sinus pain, fevers or chills.  Eczema: Patient has history of eczema which is exacerbated by her seasonal allergies.  Due to her recent flare, she has an outbreak primarily on her arms.  She says she used to be on a steroid ointment in the past which worked well.  ROS: see HPI for pertinent.  [place]: Obesity, OSA, chronic low back pain, prediabetes, allergies, eczema.  Surgical history unremarkable.  Family history HTN, migraines.  Smoking status reviewed. Medications reviewed.  Objective   BP 140/80   Pulse 97   Temp 98.3 F (36.8 C) (Oral)   Wt (!) 378 lb (171.5 kg)   SpO2 96%   BMI  55.82 kg/m  Vitals and nursing note reviewed.  General: well nourished, well developed, NAD with non-toxic appearance HEENT: normocephalic, atraumatic, moist mucous membranes Neck: supple, non-tender without lymphadenopathy Cardiovascular: regular rate and rhythm without murmurs, rubs, or gallops Lungs: clear to auscultation bilaterally with normal work of breathing Abdomen: obese, soft, non-tender, non-distended, normoactive bowel sounds Skin: warm, dry, no rashes or lesions, cap refill < 2 seconds Extremities: warm and well perfused, normal tone, no edema, 5/5 motor strength on lower extremities bilaterally with intact sensation throughout, mild tenderness to left paraspinal muscle  Assessment & Plan   Seasonal allergic rhinitis Chronic.  Recent exacerbation causing eczema outbreak.  Not on antihistamine therapy.  No signs of secondary bacterial infection. - Initiating Flonase 2 sprays in each nare daily during exacerbation and decrease to 1 spray in each nare daily for control  Chronic left-sided low back pain without sciatica Chronic.  No red flags.  Does not seem to have radiculopathy.  Consistent with paraspinal strain mended to left side.  Does have a history of slipped disc at L4 based on MRI per patient report.  Suspect significant obesity is primary source for underlying back pain. - ROI signed to obtain records from [facility] and MRI - Ambulatory referral to PT - Discussed importance of weight loss, patient to consider bariatric surgery at next appointment - Flu and Tdap vaccines given  Eczema Chronic.  Recent exacerbation due to allergies.  No  facial involvement.  Mild in severity. - Refill triamcinolone ointment 0.1%  Orders Placed This Encounter  Procedures  . Flu Vaccine QUAD 36+ mos IM  . Tdap vaccine greater than or equal to 7yo IM  . Ambulatory referral to Physical Therapy    Referral Priority:   Routine    Referral Type:   Physical Medicine    Referral  Reason:   Specialty Services Required    Requested Specialty:   Physical Therapy    Number of Visits Requested:   1   Meds ordered this encounter  Medications  . triamcinolone ointment (KENALOG) 0.1 %    Sig: Apply 1 application topically 2 (two) times daily.    Dispense:  80 g    Refill:  0  . fluticasone (FLONASE) 50 MCG/ACT nasal spray    Sig: Place 2 sprays into both nostrils daily.    Dispense:  16 g    Refill:  [place], [place], PGY-3 03/19/2018, 12:00 PM

## 2018-03-21 ENCOUNTER — Ambulatory Visit: Payer: BLUE CROSS/BLUE SHIELD | Admitting: Family Medicine

## 2018-04-04 ENCOUNTER — Ambulatory Visit: Payer: BLUE CROSS/BLUE SHIELD | Admitting: Physical Therapy

## 2018-04-04 ENCOUNTER — Encounter: Payer: Self-pay | Admitting: Family Medicine

## 2018-04-14 ENCOUNTER — Ambulatory Visit: Payer: BLUE CROSS/BLUE SHIELD | Attending: Family Medicine | Admitting: Physical Therapy

## 2018-04-14 ENCOUNTER — Encounter: Payer: Self-pay | Admitting: Physical Therapy

## 2018-04-14 ENCOUNTER — Other Ambulatory Visit: Payer: Self-pay

## 2018-04-14 DIAGNOSIS — R262 Difficulty in walking, not elsewhere classified: Secondary | ICD-10-CM | POA: Diagnosis present

## 2018-04-14 DIAGNOSIS — G8929 Other chronic pain: Secondary | ICD-10-CM | POA: Diagnosis present

## 2018-04-14 DIAGNOSIS — M25652 Stiffness of left hip, not elsewhere classified: Secondary | ICD-10-CM | POA: Insufficient documentation

## 2018-04-14 DIAGNOSIS — M25651 Stiffness of right hip, not elsewhere classified: Secondary | ICD-10-CM | POA: Insufficient documentation

## 2018-04-14 DIAGNOSIS — M545 Low back pain: Secondary | ICD-10-CM | POA: Insufficient documentation

## 2018-04-14 NOTE — Therapy (Signed)
[place], [place], [address] Phone: [phone]   Fax:  [phone]  Physical Therapy Evaluation  Patient Details  Name: Anita Hunter MRN: [id number] Date of Birth: January 28, 1991 Referring Provider (PT): Harriet Butte, DO    Encounter Date: 04/14/2018  PT End of Session - 04/14/18 1308    Visit Number  1    Number of Visits  16    Date for PT Re-Evaluation  06/09/18    PT Start Time  1657    PT Stop Time  1237    PT Time Calculation (min)  50 min    Activity Tolerance  Patient tolerated treatment well    Behavior During Therapy  [facility] for tasks assessed/performed       Past Medical History:  Diagnosis Date  . Back pain   . Eczema   . Seasonal allergies   . Sleep apnea     Past Surgical History:  Procedure Laterality Date  . NO PAST SURGERIES      There were no vitals filed for this visit.   Subjective Assessment - 04/14/18 1156    Subjective  Pt presents for low back pain which has been ongoing for > 1 yr.  She had a fall at work which she did PT and chiropractic for.  She reports her pain is now more functionally limiting as her pain has become more middle and upper back as well.  She denies sensory or motor changes .  She is very limited in how long she can stand and walk.  Her work duties have had to be modified for her so she is on phones only.     Pertinent History  allergies, eczema, sleep apnea , obesity     Limitations  Standing;Walking;House hold activities;Other (comment)    How long can you stand comfortably?  < 1 min     How long can you walk comfortably?  very slow , can do about 1 min     Diagnostic tests  MRI 2019 showed tiny L5-S1 protrusion    Patient Stated Goals  patient would like to be able to have fun, get mobility     Currently in Pain?  Yes    Pain Score  8     Pain Location  Back    Pain Orientation  Lower;Upper;Mid    Pain Descriptors / Indicators  Tightness     Pain Type  Chronic pain    Pain Radiating Towards  lower to upper back     Pain Onset  More than a month ago    Pain Frequency  Constant    Aggravating Factors   cold, standing, walking     Pain Relieving Factors  sitting, relaxing, laying down     Effect of Pain on Daily Activities  limits her work status , recreation          [facility] PT Assessment - 04/14/18 0001      Assessment   Medical Diagnosis  low back pain     Referring Provider (PT)  Harriet Butte, DO     Onset Date/Surgical Date  --   chronic    Hand Dominance  Right    Next MD Visit  4 weeks     Prior Therapy  1 yr ago [facility]       Precautions   Precautions  None      Restrictions   Weight Bearing Restrictions  No  Balance Screen   Has the patient fallen in the past 6 months  No      [place] residence    Living Arrangements  Non-relatives/Friends    Type of Home  Apartment    Additional Comments  roommate helps       Prior Function   Level of Independence  Independent with basic ADLs;Independent with household mobility without device    Vocation  Full time employment;Student    Vocation Requirements  school at [employer], [employer] more light duty (call center)     Leisure  Not much would like to travel       Cognition   Overall Cognitive Status  Within Functional Limits for tasks assessed      Observation/Other Assessments   Focus on Therapeutic Outcomes (FOTO)   63% (leg)       Sensation   Light Touch  Appears Intact      Coordination   Gross Motor Movements are Fluid and Coordinated  Not tested      Functional Tests   Functional tests  Squat      Squat   Comments  good technique, pain increased       Posture/Postural Control   Posture/Postural Control  Postural limitations    Postural Limitations  Increased lumbar lordosis;Right pelvic obliquity    Posture Comments  R pelvis protraction , wide BOS, genu valgus       AROM   Lumbar Flexion  no pain, 25%  limited     Lumbar Extension  25% no pain     Lumbar - Right Side Bend  WFL    Lumbar - Left Side Bend  WFL    Lumbar - Right Rotation  25%    Lumbar - Left Rotation  25%      PROM   Overall PROM Comments  hip flexion 90 -95 deg in supine , hips decreased IR> ER bilaterally       Strength   Overall Strength Comments  WFL       Flexibility   Hamstrings  <40 deg bilateral    Quadriceps  tight knee flexion about 100 deg     Piriformis  tight       Palpation   Spinal mobility  hypomobilty throughout T-L spine     Palpation comment  pain at L5 bilateral and into SIJ bilateral , paraspinals sore from lumbar to thoracic       Special Tests   Other special tests  neg SLR                 Objective measurements completed on examination: See above findings.      [place] Adult PT Treatment/Exercise - 04/14/18 0001      Lumbar Exercises: Stretches   Lower Trunk Rotation  5 reps;10 seconds    Lower Trunk Rotation Limitations  knees wide x 5, knees together x 5       Lumbar Exercises: Quadruped   [place]/Old Horse  5 reps    Other Quadruped Lumbar Exercises  used sink , unable to do childs pose due to lack of hip flexion              PT Education - 04/14/18 1308    Education Details  PT/POC, HEP , mobility of hips, core     Person(s) Educated  Patient    Methods  Explanation;Handout    Comprehension  Verbalized understanding  PT Short Term Goals - 04/14/18 1337      PT SHORT TERM GOAL #1   Title  Pt will be I with initial HEP for hips, core, trunk    Time  4    Period  Weeks    Status  New    Target Date  05/12/18      PT SHORT TERM GOAL #2   Title  Pt will report getting in and out of the car without difficulty     Time  4    Period  Weeks    Status  New    Target Date  05/12/18      PT SHORT TERM GOAL #3   Title  Pt will be able to stand for ADLs with pain <  6/10 most of the time.     Time  4    Period  Weeks    Status  New    Target Date   05/12/18        PT Long Term Goals - 04/14/18 1341      PT LONG TERM GOAL #1   Title  Pt will be able to improve her FOTO score to <45%     Time  8    Period  Weeks    Status  New    Target Date  06/09/18      PT LONG TERM GOAL #2   Title  Pt will be able to walk 2 blocks with min difficulty due to pain.     Time  8    Period  Weeks    Status  New    Target Date  06/09/18      PT LONG TERM GOAL #3   Title  Pt will be able to do light activities in her home without limitation of pain     Time  8    Period  Weeks    Status  New    Target Date  06/09/18      PT LONG TERM GOAL #4   Title  pt will be I with final HEP and demo safe lifting for work, home tasks.     Time  8    Period  Weeks    Status  New    Target Date  06/09/18             Plan - 04/14/18 1319    Clinical Impression Statement  Pt presents for eval of low back pain which has been ongoing for many years (since high school) .  She fell on her L side >1 yr ago and has had more consistent pain since then.  She shows some asymmetry in her pelvic alignment, significant tightness in hips and spine.  LE strength and WFL but core support is poor.  She will benefit from PT to improve her overall mobility and function, but in the past chiropractic has been more successful than PT, so we will emphasize HEP and ensure proper technique.      Personal Factors and Comorbidities  Time since onset of injury/illness/exacerbation;Past/Current Experience;Comorbidity 1    Comorbidities  obesity     Examination-Activity Limitations  Sit;Bend;Lift;Squat;Stand    Examination-Participation Restrictions  Community Activity;Cleaning;Shop;Laundry    Stability/Clinical Decision Making  Stable/Uncomplicated    Clinical Decision Making  Low    Rehab Potential  Good    PT Frequency  2x / week    PT Duration  8 weeks    PT Treatment/Interventions  ADLs/Self  Care Home Management;Cryotherapy;Electrical Stimulation;Moist  Heat;Traction;Therapeutic exercise;Therapeutic activities;Patient/family education;Manual techniques;Passive range of motion;Functional mobility training;Dry needling    PT Next Visit Plan  check HEP, NuStep, begin with supine core, progress to standing core , hip/trunk flexibility , practice lifting    PT Home Exercise Plan  hip Er/IR, trunk rotation, standing "sink " stretch and cat/camel for spinal mobility     Consulted and Agree with Plan of Care  Patient       Patient will benefit from skilled therapeutic intervention in order to improve the following deficits and impairments:  Decreased endurance, Decreased mobility, Difficulty walking, Hypomobility, Obesity, Improper body mechanics, Decreased range of motion, Decreased activity tolerance, Decreased strength, Increased fascial restricitons, Impaired flexibility, Impaired UE functional use, Postural dysfunction, Pain  Visit Diagnosis: Chronic bilateral low back pain without sciatica - Plan: PT plan of care cert/re-cert  Difficulty in walking, not elsewhere classified - Plan: PT plan of care cert/re-cert  Stiffness of right hip, not elsewhere classified - Plan: PT plan of care cert/re-cert  Stiffness of left hip, not elsewhere classified - Plan: PT plan of care cert/re-cert     Problem List Patient Active Problem List   Diagnosis Date Noted  . Chronic left-sided low back pain without sciatica 03/19/2018  . Seasonal allergic rhinitis 03/19/2018  . RLQ abdominal pain 10/24/2016  . Obstructive sleep apnea 08/27/2016  . Back pain 06/27/2016  . Prediabetes 03/02/2015  . Sinusitis, chronic 03/25/2014  . Tobacco use disorder 06/14/2012  . Knee pain, left 06/13/2012  . Morbid obesity with BMI of 50.0-59.9, adult ([place]) 04/11/2006  . Eczema 04/11/2006    Martinez Boxx 04/14/2018, 1:49 PM  [facility] [address] [place], [place], [address] Phone: [phone]   Fax:   [phone]  Name: SHEVELLE SMITHER MRN: [id number] Date of Birth: 02/24/1990   Raeford Razor, PT 04/14/18 1:50 PM Phone: [phone] Fax: [phone]

## 2018-04-16 ENCOUNTER — Other Ambulatory Visit: Payer: Self-pay

## 2018-04-16 ENCOUNTER — Encounter: Payer: Self-pay | Admitting: Family Medicine

## 2018-04-16 ENCOUNTER — Ambulatory Visit (INDEPENDENT_AMBULATORY_CARE_PROVIDER_SITE_OTHER): Payer: BLUE CROSS/BLUE SHIELD | Admitting: Family Medicine

## 2018-04-16 ENCOUNTER — Other Ambulatory Visit (HOSPITAL_COMMUNITY)
Admission: RE | Admit: 2018-04-16 | Discharge: 2018-04-16 | Disposition: A | Discharge status: Home / Self Care | Payer: BLUE CROSS/BLUE SHIELD | Source: Ambulatory Visit | Attending: Family Medicine | Admitting: Family Medicine

## 2018-04-16 VITALS — BP 136/88 | HR 89 | Temp 97.8°F | Ht 69.0 in | Wt 377.4 lb

## 2018-04-16 DIAGNOSIS — Z124 Encounter for screening for malignant neoplasm of cervix: Secondary | ICD-10-CM | POA: Insufficient documentation

## 2018-04-16 DIAGNOSIS — Z0000 Encounter for general adult medical examination without abnormal findings: Secondary | ICD-10-CM | POA: Diagnosis not present

## 2018-04-16 DIAGNOSIS — Z6841 Body Mass Index (BMI) 40.0 and over, adult: Secondary | ICD-10-CM

## 2018-04-16 DIAGNOSIS — G44209 Tension-type headache, unspecified, not intractable: Secondary | ICD-10-CM | POA: Insufficient documentation

## 2018-04-16 NOTE — Progress Notes (Signed)
   Subjective   Patient ID: Anita Hunter    DOB: November 22, 1990, 28 y.o. female   MRN: [id number]  CC: "Routine checkup"  HPI: Anita Hunter is a 28 y.o. female who presents to clinic today for the following:  Annual physical: Anita Hunter is here today for her routine annual checkup.  Her only concern is intermittent headaches.  She is otherwise doing well.  She denies tobacco use, alcohol use, or illicit drug use.  Her primary problem is her severe obesity.  She is overdue for a Pap smear.  Denies any vaginal discharge or bleeding.  He is not sexually active.  She denies symptoms including fatigue, shortness of breath, chest pain, nausea or vomiting.  Obesity: Patient has a significant history of obesity with BMI around 55.  She is desiring weight loss but has not made efforts with her lifestyle.  When discussing her routine diet, she reports having a bagel in the morning, [employer] in the afternoon, and chips later in the day.  She tries to drink plenty of water and will occasionally have a ginger ale.  Headaches: Intermittent episodes of the occiput.  Patient felt that this may be related to her dreads.  He does improve with Tylenol use.  She denies any change to her vision, neck stiffness, numbness or weakness on the extremities.  Pain is mild and has a pulling sensation.  ROS: see HPI for pertinent.  [place]: Obesity, OSA, chronic low back pain, prediabetes, allergies, eczema.  Surgical history unremarkable.  Family history HTN, migraines.  Smoking status reviewed. Medications reviewed.  Objective   BP 136/88   Pulse 89   Temp 97.8 F (36.6 C) (Oral)   Ht 5' 9"  (1.753 m)   Wt (!) 377 lb 6.4 oz (171.2 kg)   SpO2 90%   BMI 55.73 kg/m  Vitals and nursing note reviewed.  General: well nourished, well developed, NAD with non-toxic appearance HEENT: normocephalic, atraumatic, moist mucous membranes, patent ear canals with gray TMs Neck: supple, non-tender without  lymphadenopathy Cardiovascular: regular rate and rhythm without murmurs, rubs, or gallops Lungs: clear to auscultation bilaterally with normal work of breathing Abdomen: obese, soft, non-tender, non-distended, normoactive bowel sounds GU: accompanied by chaperone, no external lesions or rash, no bleeding or discharge in vaginal vault, cervical os nonfriable and nontender with no adnexal tenderness Skin: warm, dry, no rashes or lesions, cap refill < 2 seconds Extremities: warm and well perfused, normal tone, no edema  Assessment & Plan   Morbid obesity with BMI of 50.0-59.9, adult ([facility]) Significantly overweight.  Has poor diet and does not exercise.  Patient is motivated but busy lifestyle is limiting for her. - Advised to schedule appoint with Dr. Jenne Campus nutrition discussion - Discussed preventive lifestyle modifications  Acute non intractable tension-type headache No red flags.  Consistent with tension-like headache. - Continue Tylenol as needed - Reviewed return precautions, RTC as needed  Encounter for annual physical exam Primary concern is her significant weight.  Non-smoker. - Pap smear performed - RTC 1 year or sooner if needed  No orders of the defined types were placed in this encounter.  No orders of the defined types were placed in this encounter.   Harriet Butte, [place], PGY-3 04/16/2018, 1:37 PM

## 2018-04-16 NOTE — Assessment & Plan Note (Signed)
No red flags.  Consistent with tension-like headache. - Continue Tylenol as needed - Reviewed return precautions, RTC as needed

## 2018-04-16 NOTE — Assessment & Plan Note (Signed)
Significantly overweight.  Has poor diet and does not exercise.  Patient is motivated but busy lifestyle is limiting for her. - Advised to schedule appoint with Dr. Jenne Campus nutrition discussion - Discussed preventive lifestyle modifications

## 2018-04-16 NOTE — Patient Instructions (Signed)
Thank you for coming in to see [place] today. Please see below to review our plan for today's visit.  1.  It is very important that you avoid eating out as frequent as possible.  I know this can be difficult when you are busy with both a job and school.  It is important that that your health first.  Incorporating vegetables daily and avoiding sugary foods and snacks along with processed foods would be a good first step for you. 2.  Follow-up in 1 year for your next annual visit, otherwise on an as-needed basis. 3.  Continue Tylenol as needed for your headaches.  Let me know if there are any changes. 4.  I will call you if there is any normalities to your Pap smear.  Results will be available in about 48 hours, otherwise you should receive results in the mail.  Please call the clinic at [phone] if your symptoms worsen or you have any concerns. It was our pleasure to serve you.  Harriet Butte, [place], PGY-3

## 2018-04-16 NOTE — Assessment & Plan Note (Signed)
Primary concern is her significant weight.  Non-smoker. - Pap smear performed - RTC 1 year or sooner if needed

## 2018-04-17 ENCOUNTER — Encounter: Payer: Self-pay | Admitting: Family Medicine

## 2018-04-17 LAB — CYTOLOGY - PAP
Adequacy: ABSENT
DIAGNOSIS: NEGATIVE

## 2018-04-21 ENCOUNTER — Ambulatory Visit: Payer: BLUE CROSS/BLUE SHIELD | Admitting: Physical Therapy

## 2018-04-21 ENCOUNTER — Encounter: Payer: Self-pay | Admitting: Physical Therapy

## 2018-04-21 DIAGNOSIS — M25652 Stiffness of left hip, not elsewhere classified: Secondary | ICD-10-CM

## 2018-04-21 DIAGNOSIS — R262 Difficulty in walking, not elsewhere classified: Secondary | ICD-10-CM

## 2018-04-21 DIAGNOSIS — M545 Low back pain: Principal | ICD-10-CM

## 2018-04-21 DIAGNOSIS — M25651 Stiffness of right hip, not elsewhere classified: Secondary | ICD-10-CM

## 2018-04-21 DIAGNOSIS — G8929 Other chronic pain: Secondary | ICD-10-CM

## 2018-04-21 NOTE — Therapy (Addendum)
[place], [place], [address] Phone: [phone]   Fax:  [phone]  Physical Therapy Treatment/Addended Discharge   Patient Details  Name: Anita Hunter MRN: [id number] Date of Birth: 04/20/1990 Referring Provider (PT): Harriet Butte, DO    Encounter Date: 04/21/2018  PT End of Session - 04/21/18 1423    Visit Number  2    Number of Visits  16    Date for PT Re-Evaluation  06/09/18    PT Start Time  9935    PT Stop Time  1510    PT Time Calculation (min)  52 min    Activity Tolerance  Patient tolerated treatment well    Behavior During Therapy  [facility] for tasks assessed/performed       Past Medical History:  Diagnosis Date  . Back pain   . Eczema   . Seasonal allergies   . Sleep apnea     Past Surgical History:  Procedure Laterality Date  . NO PAST SURGERIES      There were no vitals filed for this visit.  Subjective Assessment - 04/21/18 1422    Subjective  Worked today, pain about 4/10 today.  No other new complaints.     Currently in Pain?  Yes    Pain Score  4         [facility] Adult PT Treatment/Exercise - 04/21/18 0001      Lumbar Exercises: Stretches   Active Hamstring Stretch  Right;Left;3 reps;30 seconds    Single Knee to Chest Stretch  Right;Left;3 reps    Single Knee to Chest Stretch Limitations  used sheet     Lower Trunk Rotation  5 reps;10 seconds    Lower Trunk Rotation Limitations  knees wide x 5, knees together x 5     Other Lumbar Stretch Exercise  sidelying upper trunk rotation x 5 each side       Lumbar Exercises: Aerobic   UBE (Upper Arm Bike)  5 min L1       Lumbar Exercises: Standing   Lifting Limitations  unable to do without increased pain, did about 8 reps with 10 lbs     Other Standing Lumbar Exercises  ball press standing x 10 for core     Other Standing Lumbar Exercises  tried walking but stiffened up , pain 9/10 post walk       Lumbar Exercises:  Quadruped   Other Quadruped Lumbar Exercises  sink stretch x 5, felt mostly in R low back       Cryotherapy   Number Minutes Cryotherapy  10 Minutes    Cryotherapy Location  Lumbar Spine    Type of Cryotherapy  Ice pack      Manual Therapy   Manual Therapy  Joint mobilization    Manual therapy comments       Joint Mobilization  P/A along T-L spine and rotational bilateral Gr I- II.  pain L5                PT Short Term Goals - 04/14/18 1337      PT SHORT TERM GOAL #1   Title  Pt will be I with initial HEP for hips, core, trunk    Time  4    Period  Weeks    Status  New    Target Date  05/12/18      PT SHORT TERM GOAL #2   Title  Pt will report getting in  and out of the car without difficulty     Time  4    Period  Weeks    Status  New    Target Date  05/12/18      PT SHORT TERM GOAL #3   Title  Pt will be able to stand for ADLs with pain <  6/10 most of the time.     Time  4    Period  Weeks    Status  New    Target Date  05/12/18        PT Long Term Goals - 04/14/18 1341      PT LONG TERM GOAL #1   Title  Pt will be able to improve her FOTO score to <45%     Time  8    Period  Weeks    Status  New    Target Date  06/09/18      PT LONG TERM GOAL #2   Title  Pt will be able to walk 2 blocks with min difficulty due to pain.     Time  8    Period  Weeks    Status  New    Target Date  06/09/18      PT LONG TERM GOAL #3   Title  Pt will be able to do light activities in her home without limitation of pain     Time  8    Period  Weeks    Status  New    Target Date  06/09/18      PT LONG TERM GOAL #4   Title  pt will be I with final HEP and demo safe lifting for work, home tasks.     Time  8    Period  Weeks    Status  New    Target Date  06/09/18            Plan - 04/21/18 1422    Clinical Impression Statement  Pt showed decreased exercise tolerance with simple squatting, lifting.  She reports that this is the pain that hold her back  from exercising.  When her pain is increased it takes only a few minutes of sitting to reduce her pain back to baseline.  Pain back to baseline (3/10) after session/ice.     Personal Factors and Comorbidities  Time since onset of injury/illness/exacerbation;Past/Current Experience;Comorbidity 1    Examination-Activity Limitations  Sit;Bend;Lift;Squat;Stand    Examination-Participation Restrictions  Community Activity;Cleaning;Shop;Laundry    PT Treatment/Interventions  ADLs/Self Care Home Management;Cryotherapy;Electrical Stimulation;Moist Heat;Traction;Therapeutic exercise;Therapeutic activities;Patient/family education;Manual techniques;Passive range of motion;Functional mobility training;Dry needling    PT Next Visit Plan  check HEP, NuStep, begin with supine core, progress to standing core , hip/trunk flexibility , practice lifting    PT Home Exercise Plan  hip Er/IR, trunk rotation, standing "sink " stretch and cat/camel for spinal mobility     Consulted and Agree with Plan of Care  Patient       Patient will benefit from skilled therapeutic intervention in order to improve the following deficits and impairments:  Decreased endurance, Decreased mobility, Difficulty walking, Hypomobility, Obesity, Improper body mechanics, Decreased range of motion, Decreased activity tolerance, Decreased strength, Increased fascial restricitons, Impaired flexibility, Impaired UE functional use, Postural dysfunction, Pain  Visit Diagnosis: Chronic bilateral low back pain without sciatica  Difficulty in walking, not elsewhere classified  Stiffness of right hip, not elsewhere classified  Stiffness of left hip, not elsewhere classified     Problem List  Patient Active Problem List   Diagnosis Date Noted  . Acute non intractable tension-type headache 04/16/2018  . Encounter for annual physical exam 04/16/2018  . Chronic left-sided low back pain without sciatica 03/19/2018  . Seasonal allergic rhinitis  03/19/2018  . RLQ abdominal pain 10/24/2016  . Obstructive sleep apnea 08/27/2016  . Back pain 06/27/2016  . Prediabetes 03/02/2015  . Sinusitis, chronic 03/25/2014  . Tobacco use disorder 06/14/2012  . Knee pain, left 06/13/2012  . Morbid obesity with BMI of 50.0-59.9, adult ([place]) 04/11/2006  . Eczema 04/11/2006    PAA,JENNIFER 04/21/2018, 3:13 PM  [facility] [address] [place], [place], [address] Phone: [phone]   Fax:  [phone]  Name: MAURINA FAWAZ MRN: [id number] Date of Birth: [birth year]  Raeford Razor, PT 04/21/18 3:13 PM Phone: [phone] Fax: [phone]  PHYSICAL THERAPY DISCHARGE SUMMARY  Visits from Start of Care: 2  Current functional level related to goals / functional outcomes: Unknown, see above for most recent info   Remaining deficits: Unknown    Education / Equipment: HEP , pain   Plan: Patient agrees to discharge.  Patient goals were not met. Patient is being discharged due to not returning since the last visit.  ?????    Raeford Razor, PT 10/23/18 2:19 PM Phone: [phone] Fax: [phone]

## 2018-04-30 ENCOUNTER — Ambulatory Visit: Payer: BLUE CROSS/BLUE SHIELD | Admitting: Physical Therapy

## 2018-05-05 ENCOUNTER — Other Ambulatory Visit: Payer: Self-pay

## 2018-05-05 ENCOUNTER — Emergency Department (HOSPITAL_BASED_OUTPATIENT_CLINIC_OR_DEPARTMENT_OTHER)
Admission: EM | Admit: 2018-05-05 | Discharge: 2018-05-05 | Disposition: A | Discharge status: Home / Self Care | Payer: BLUE CROSS/BLUE SHIELD | Attending: Emergency Medicine | Admitting: Emergency Medicine

## 2018-05-05 ENCOUNTER — Encounter (HOSPITAL_BASED_OUTPATIENT_CLINIC_OR_DEPARTMENT_OTHER): Payer: Self-pay | Admitting: Emergency Medicine

## 2018-05-05 ENCOUNTER — Emergency Department (HOSPITAL_BASED_OUTPATIENT_CLINIC_OR_DEPARTMENT_OTHER): Payer: BLUE CROSS/BLUE SHIELD

## 2018-05-05 ENCOUNTER — Ambulatory Visit: Payer: BLUE CROSS/BLUE SHIELD | Admitting: Physical Therapy

## 2018-05-05 DIAGNOSIS — R739 Hyperglycemia, unspecified: Secondary | ICD-10-CM | POA: Insufficient documentation

## 2018-05-05 DIAGNOSIS — R05 Cough: Secondary | ICD-10-CM | POA: Insufficient documentation

## 2018-05-05 DIAGNOSIS — J4531 Mild persistent asthma with (acute) exacerbation: Secondary | ICD-10-CM | POA: Diagnosis not present

## 2018-05-05 DIAGNOSIS — Z79899 Other long term (current) drug therapy: Secondary | ICD-10-CM | POA: Insufficient documentation

## 2018-05-05 DIAGNOSIS — R7303 Prediabetes: Secondary | ICD-10-CM | POA: Diagnosis not present

## 2018-05-05 DIAGNOSIS — R0602 Shortness of breath: Secondary | ICD-10-CM | POA: Diagnosis present

## 2018-05-05 LAB — BASIC METABOLIC PANEL
Anion gap: 7 (ref 5–15)
BUN: 13 mg/dL (ref 6–20)
CO2: 29 mmol/L (ref 22–32)
Calcium: 8.3 mg/dL — ABNORMAL LOW (ref 8.9–10.3)
Chloride: 98 mmol/L (ref 98–111)
Creatinine, Ser: 0.7 mg/dL (ref 0.44–1.00)
GFR calc Af Amer: >60 mL/min (ref >60)
GFR calc non Af Amer: >60 mL/min (ref >60)
Glucose, Bld: 241 mg/dL — ABNORMAL HIGH (ref 70–99)
Potassium: 3.6 mmol/L (ref 3.5–5.1)
Sodium: 134 mmol/L — ABNORMAL LOW (ref 135–145)

## 2018-05-05 LAB — CBC WITH DIFFERENTIAL/PLATELET
Abs Immature Granulocytes: 0.04 10*3/uL (ref 0.00–0.07)
Basophils Absolute: 0 10*3/uL (ref 0.0–0.1)
Basophils Relative: 0 %
Eosinophils Absolute: 0.2 10*3/uL (ref 0.0–0.5)
Eosinophils Relative: 2 %
HCT: 41.9 % (ref 36.0–46.0)
Hemoglobin: 12.4 g/dL (ref 12.0–15.0)
Immature Granulocytes: 1 %
Lymphocytes Relative: 23 %
Lymphs Abs: 2 10*3/uL (ref 0.7–4.0)
MCH: 25.8 pg — ABNORMAL LOW (ref 26.0–34.0)
MCHC: 29.6 g/dL — ABNORMAL LOW (ref 30.0–36.0)
MCV: 87.1 fL (ref 80.0–100.0)
MONO ABS: 0.5 10*3/uL (ref 0.1–1.0)
Monocytes Relative: 6 %
Neutro Abs: 5.8 10*3/uL (ref 1.7–7.7)
Neutrophils Relative %: 68 %
Platelets: 231 10*3/uL (ref 150–400)
RBC: 4.81 MIL/uL (ref 3.87–5.11)
RDW: 13.1 % (ref 11.5–15.5)
WBC: 8.5 10*3/uL (ref 4.0–10.5)
nRBC: 0 % (ref 0.0–0.2)

## 2018-05-05 LAB — TROPONIN I: Troponin I: <0.03 ng/mL (ref <0.03)

## 2018-05-05 LAB — D-DIMER, QUANTITATIVE: D-Dimer, Quant: <0.27 ug/mL-FEU (ref 0.00–0.50)

## 2018-05-05 LAB — CBG MONITORING, ED: Glucose-Capillary: 207 mg/dL — ABNORMAL HIGH (ref 70–99)

## 2018-05-05 MED ORDER — PREDNISONE 20 MG PO TABS: ORAL_TABLET | ORAL | 0 refills | Status: DC

## 2018-05-05 MED ORDER — METFORMIN HCL 500 MG PO TABS: 500.0000 mg | ORAL_TABLET | Freq: Every day | ORAL | 0 refills | Status: DC

## 2018-05-05 MED ORDER — SODIUM CHLORIDE 0.9 % IV BOLUS
1000.0000 mL | Freq: Once | INTRAVENOUS | Status: AC
  Administered 2018-05-05: 1000 mL via INTRAVENOUS

## 2018-05-05 MED ORDER — ALBUTEROL SULFATE HFA 108 (90 BASE) MCG/ACT IN AERS
1.0000 | INHALATION_SPRAY | Freq: Once | RESPIRATORY_TRACT | Status: AC
  Administered 2018-05-05: 1 via RESPIRATORY_TRACT
  Filled 2018-05-05: qty 6.7

## 2018-05-05 MED ORDER — METHYLPREDNISOLONE SODIUM SUCC 125 MG IJ SOLR
125.0000 mg | Freq: Once | INTRAMUSCULAR | Status: AC
  Administered 2018-05-05: 125 mg via INTRAVENOUS
  Filled 2018-05-05: qty 2

## 2018-05-05 MED ORDER — METFORMIN HCL 500 MG PO TABS
500.0000 mg | ORAL_TABLET | Freq: Once | ORAL | Status: AC
  Administered 2018-05-05: 500 mg via ORAL
  Filled 2018-05-05: qty 1

## 2018-05-05 NOTE — ED Notes (Signed)
ED Provider at bedside. 

## 2018-05-05 NOTE — ED Notes (Signed)
EKG given to Dr. Darl Householder.

## 2018-05-05 NOTE — ED Notes (Signed)
Pt given icewater for Fluid challenge.

## 2018-05-05 NOTE — ED Triage Notes (Signed)
Reports shortness of breath which began 30 minutes PTA.  Reports dry cough.

## 2018-05-05 NOTE — ED Provider Notes (Signed)
[facility] EMERGENCY DEPARTMENT Provider Note   CSN: [id number] Arrival date & time: 05/05/18  1655    History   Chief Complaint Chief Complaint  Patient presents with   Shortness of Breath    HPI Anita Hunter is a 28 y.o. female history of seasonal allergies, here presenting with shortness of breath, cough.  Patient states that she works at [employer] and is around a lot of customers recently.  She states that she has been having a dry cough for the last several days.  She denies any fevers or chills.  She states that about 30 minutes prior to arrival, she has acute onset of shortness of breath.  She denies any recent travel or confirmed exposure to [place]. She states that she has previous seasonal allergies and possible asthma but is not on an inhaler at baseline.  Denies smoking.      The history is provided by the patient.    Past Medical History:  Diagnosis Date   Back pain    Eczema    Seasonal allergies    Sleep apnea     Patient Active Problem List   Diagnosis Date Noted   Acute non intractable tension-type headache 04/16/2018   Encounter for annual physical exam 04/16/2018   Chronic left-sided low back pain without sciatica 03/19/2018   Seasonal allergic rhinitis 03/19/2018   RLQ abdominal pain 10/24/2016   Obstructive sleep apnea 08/27/2016   Back pain 06/27/2016   Prediabetes 03/02/2015   Sinusitis, chronic 03/25/2014   Tobacco use disorder 06/14/2012   Knee pain, left 06/13/2012   Morbid obesity with BMI of 50.0-59.9, adult ([place]) 04/11/2006   Eczema 04/11/2006    Past Surgical History:  Procedure Laterality Date   NO PAST SURGERIES       OB History    Gravida  0   Para  0   Term  0   Preterm  0   AB  0   Living  0     SAB  0   TAB  0   Ectopic  0   Multiple  0   Live Births  0            Home Medications    Prior to Admission medications   Medication Sig Start Date End Date Taking?  Authorizing Provider  famotidine (PEPCID) 20 MG tablet Take 1 tablet (20 mg total) by mouth 2 (two) times daily. Patient not taking: Reported on 04/16/2018 10/21/16   Charlesetta Shanks, MD  fluticasone [facility]) 50 MCG/ACT nasal spray Place 2 sprays into both nostrils daily. 03/19/18   Hunter Bing, DO  methocarbamol (ROBAXIN) 500 MG tablet Take 1 tablet (500 mg total) by mouth 2 (two) times daily. Patient not taking: Reported on 04/16/2018 06/24/16   Ashley Murrain, NP  triamcinolone ointment (KENALOG) 0.1 % Apply 1 application topically 2 (two) times daily. 03/19/18   [place] Bing, DO    Family History Family History  Problem Relation Age of Onset   Hypertension Father    Migraines Father    Asthma Brother        No formal diagnosis as of 06/13/2012   Diabetes Maternal Grandmother    Hypertension Maternal Grandmother    Hypertension Paternal Grandmother    Fibroids Other        Uncertain which family member(s)    Social History Social History   Tobacco Use   Smoking status: Never Smoker   Smokeless tobacco: Never Used  Substance Use Topics   Alcohol use: No   Drug use: No    Types: Marijuana    Comment: marijuna tea     Allergies   Shellfish allergy   Review of Systems Review of Systems  Respiratory: Positive for shortness of breath.   All other systems reviewed and are negative.    Physical Exam Updated Vital Signs BP 137/84 (BP Location: Left Arm)    Pulse (!) 103    Temp 98.5 F (36.9 C) (Oral)    Resp (!) 29    Ht 5' 9"  (1.753 m)    Wt (!) 170.1 kg    LMP 04/27/2018 (Approximate)    SpO2 99%    BMI 55.38 kg/m   Physical Exam Vitals signs and nursing note reviewed.  Constitutional:      Appearance: She is well-developed.  HENT:     Head: Normocephalic.     Mouth/Throat:     Mouth: Mucous membranes are moist.  Eyes:     Extraocular Movements: Extraocular movements intact.     Pupils: Pupils are equal, round, and reactive to light.  Neck:      Musculoskeletal: Normal range of motion and neck supple.  Cardiovascular:     Rate and Rhythm: Normal rate and regular rhythm.  Pulmonary:     Comments: Slightly tachypneic, no obvious wheezing or crackles  Abdominal:     General: Bowel sounds are normal.     Palpations: Abdomen is soft.  Musculoskeletal: Normal range of motion.  Skin:    General: Skin is warm.     Capillary Refill: Capillary refill takes less than 2 seconds.  Neurological:     General: No focal deficit present.     Mental Status: She is alert and oriented to person, place, and time.  Psychiatric:        Mood and Affect: Mood normal.        Behavior: Behavior normal.      ED Treatments / Results  Labs (all labs ordered are listed, but only abnormal results are displayed) Labs Reviewed  CBC WITH DIFFERENTIAL/PLATELET - Abnormal; Notable for the following components:      Result Value   MCH 25.8 (*)    MCHC 29.6 (*)    All other components within normal limits  BASIC METABOLIC PANEL - Abnormal; Notable for the following components:   Sodium 134 (*)    Glucose, Bld 241 (*)    Calcium 8.3 (*)    All other components within normal limits  CBG MONITORING, ED - Abnormal; Notable for the following components:   Glucose-Capillary 207 (*)    All other components within normal limits  TROPONIN I  D-DIMER, QUANTITATIVE (NOT AT [facility])    EKG EKG Interpretation  Date/Time:  Monday May 05 2018 17:12:01 EDT Ventricular Rate:  105 PR Interval:    QRS Duration: 97 QT Interval:  354 QTC Calculation: 468 R Axis:   3 Text Interpretation:  Sinus tachycardia Baseline wander in lead(s) II III aVF Since last tracing rate faster Confirmed by Wandra Arthurs [phone]) on 05/05/2018 5:20:24 PM   Radiology Dg Chest Port 1 View  Result Date: 05/05/2018 CLINICAL DATA:  Cough and shortness of breath EXAM: PORTABLE CHEST 1 VIEW COMPARISON:  January 21, 2007 FINDINGS: There is no edema or consolidation. Heart is slightly enlarged  with pulmonary vascularity normal. No adenopathy. No bone lesions. IMPRESSION: Mild cardiac enlargement.  No edema or consolidation. Electronically Signed   By: Lowella Grip  III M.D.   On: 05/05/2018 18:05    Procedures Procedures (including critical care time)  Medications Ordered in ED Medications  metFORMIN (GLUCOPHAGE) tablet 500 mg (has no administration in time range)  albuterol (PROVENTIL HFA;VENTOLIN HFA) 108 (90 Base) MCG/ACT inhaler 1 puff (1 puff Inhalation Given 05/05/18 1721)  sodium chloride 0.9 % bolus 1,000 mL ( Intravenous Stopped 05/05/18 1838)  methylPREDNISolone sodium succinate (SOLU-MEDROL) 125 mg/2 mL injection 125 mg (125 mg Intravenous Given 05/05/18 1900)     Initial Impression / Assessment and Plan / ED Course  I have reviewed the triage vital signs and the nursing notes.  Pertinent labs & imaging results that were available during my care of the patient were reviewed by me and considered in my medical decision making (see chart for details).       SELENI MELLER is a 28 y.o. female here with SOB, cough. Patient is tachycardic to 110, sinus tachycardia.  Likely some mild bronchitis but also consider PE.  She is low risk for PE so we will get a d-dimer. Will get labs, CXR. Will give albuterol as well.   7:24 PM CXR clear. Labs showed glucose 241 but she did drink some sweet tea and ate honey bun prior to arrival. Previous chemistry showed glucose 180s. I think she likely has pre diabetes vs diabetes. D-dimer neg. Since she will be discharged with steroids, will give low dose metformin as well. She will need HgA1C at PCP office.    Final Clinical Impressions(s) / ED Diagnoses   Final diagnoses:  None    ED Discharge Orders    None       Drenda Freeze, MD 05/05/18 [phone]

## 2018-05-05 NOTE — Discharge Instructions (Signed)
Take prednisone as prescribed.   Use albuterol every 4 hrs as needed for shortness of breath.   You might have diabetes and steroids will increase your sugar. Take metformin daily.   You need to see a primary care doctor in a week for recheck. You should consider workup for diabetes   Avoid drinking sugary drinks   Return to ER if you have trouble breathing, vomiting, fever.

## 2018-05-08 ENCOUNTER — Ambulatory Visit: Payer: BLUE CROSS/BLUE SHIELD | Admitting: Physical Therapy

## 2018-05-12 ENCOUNTER — Ambulatory Visit: Payer: BLUE CROSS/BLUE SHIELD | Admitting: Physical Therapy

## 2018-05-14 ENCOUNTER — Ambulatory Visit: Payer: BLUE CROSS/BLUE SHIELD | Admitting: Physical Therapy

## 2018-05-21 ENCOUNTER — Telehealth: Payer: Self-pay | Admitting: Physical Therapy

## 2018-05-21 NOTE — Telephone Encounter (Signed)
Anita Hunter was contacted today regarding the temporary reduction of OP Rehab Services due to concerns for community transmission of Covid-19.    Therapist advised the patient to continue to perform their HEP and assured they had no unanswered questions at this time.  The patient expressed interest in being contacted for an e-visit, virtual check in, or telehealth visit to continue their POC care, when those services become available.     Outpatient Rehabilitation Services will follow up with patients at that time.

## 2018-06-17 ENCOUNTER — Telehealth: Payer: Self-pay | Admitting: Physical Therapy

## 2018-06-17 NOTE — Telephone Encounter (Signed)
Spoke with patient- she would like to return to the clinic for PT visits. We will contact her to schedule.  Sherlon Nied C. Harla Mensch PT, DPT 06/17/18 1:21 PM

## 2018-06-26 ENCOUNTER — Ambulatory Visit: Payer: BLUE CROSS/BLUE SHIELD | Attending: Family Medicine | Admitting: Physical Therapy

## 2018-06-26 ENCOUNTER — Telehealth: Payer: Self-pay | Admitting: Physical Therapy

## 2018-06-29 NOTE — Telephone Encounter (Signed)
Called patient regarding her missed appt.  She thought it was for a different day and was rescheduled for Monday, May 18th.    Raeford Razor, PT 06/29/18 3:03 PM Phone: [phone] Fax: [phone]

## 2018-06-30 ENCOUNTER — Ambulatory Visit: Payer: BLUE CROSS/BLUE SHIELD | Admitting: Physical Therapy

## 2018-11-27 ENCOUNTER — Other Ambulatory Visit: Payer: Self-pay

## 2018-11-27 ENCOUNTER — Encounter (HOSPITAL_BASED_OUTPATIENT_CLINIC_OR_DEPARTMENT_OTHER): Payer: Self-pay | Admitting: *Deleted

## 2018-11-27 ENCOUNTER — Emergency Department (HOSPITAL_BASED_OUTPATIENT_CLINIC_OR_DEPARTMENT_OTHER)
Admission: EM | Admit: 2018-11-27 | Discharge: 2018-11-28 | Disposition: A | Discharge status: Home / Self Care | Payer: BC Managed Care – PPO | Attending: Emergency Medicine | Admitting: Emergency Medicine

## 2018-11-27 DIAGNOSIS — Z79899 Other long term (current) drug therapy: Secondary | ICD-10-CM | POA: Insufficient documentation

## 2018-11-27 DIAGNOSIS — Z7984 Long term (current) use of oral hypoglycemic drugs: Secondary | ICD-10-CM | POA: Diagnosis not present

## 2018-11-27 DIAGNOSIS — R7303 Prediabetes: Secondary | ICD-10-CM | POA: Insufficient documentation

## 2018-11-27 DIAGNOSIS — R1032 Left lower quadrant pain: Secondary | ICD-10-CM | POA: Diagnosis present

## 2018-11-27 DIAGNOSIS — Z91013 Allergy to seafood: Secondary | ICD-10-CM | POA: Diagnosis not present

## 2018-11-27 DIAGNOSIS — K529 Noninfective gastroenteritis and colitis, unspecified: Secondary | ICD-10-CM | POA: Diagnosis not present

## 2018-11-27 LAB — URINALYSIS, ROUTINE W REFLEX MICROSCOPIC

## 2018-11-27 LAB — COMPREHENSIVE METABOLIC PANEL
ALT: 72 U/L — ABNORMAL HIGH (ref 0–44)
AST: 88 U/L — ABNORMAL HIGH (ref 15–41)
Albumin: 3.8 g/dL (ref 3.5–5.0)
Alkaline Phosphatase: 80 U/L (ref 38–126)
Anion gap: 9 (ref 5–15)
BUN: 10 mg/dL (ref 6–20)
CO2: 28 mmol/L (ref 22–32)
Calcium: 8.2 mg/dL — ABNORMAL LOW (ref 8.9–10.3)
Chloride: 98 mmol/L (ref 98–111)
Creatinine, Ser: 0.67 mg/dL (ref 0.44–1.00)
GFR calc Af Amer: >60 mL/min (ref >60)
GFR calc non Af Amer: >60 mL/min (ref >60)
Glucose, Bld: 192 mg/dL — ABNORMAL HIGH (ref 70–99)
Potassium: 4 mmol/L (ref 3.5–5.1)
Sodium: 135 mmol/L (ref 135–145)
Total Bilirubin: 0.5 mg/dL (ref 0.3–1.2)
Total Protein: 8.3 g/dL — ABNORMAL HIGH (ref 6.5–8.1)

## 2018-11-27 LAB — CBC WITH DIFFERENTIAL/PLATELET
Abs Immature Granulocytes: 0.03 10*3/uL (ref 0.00–0.07)
Basophils Absolute: 0 10*3/uL (ref 0.0–0.1)
Basophils Relative: 0 %
Eosinophils Absolute: 0.1 10*3/uL (ref 0.0–0.5)
Eosinophils Relative: 2 %
HCT: 44.1 % (ref 36.0–46.0)
Hemoglobin: 13.2 g/dL (ref 12.0–15.0)
Immature Granulocytes: 0 %
Lymphocytes Relative: 26 %
Lymphs Abs: 2.1 10*3/uL (ref 0.7–4.0)
MCH: 26.9 pg (ref 26.0–34.0)
MCHC: 29.9 g/dL — ABNORMAL LOW (ref 30.0–36.0)
MCV: 89.8 fL (ref 80.0–100.0)
Monocytes Absolute: 0.5 10*3/uL (ref 0.1–1.0)
Monocytes Relative: 7 %
Neutro Abs: 5.2 10*3/uL (ref 1.7–7.7)
Neutrophils Relative %: 65 %
Platelets: 235 10*3/uL (ref 150–400)
RBC: 4.91 MIL/uL (ref 3.87–5.11)
RDW: 12.5 % (ref 11.5–15.5)
WBC: 8.1 10*3/uL (ref 4.0–10.5)
nRBC: 0 % (ref 0.0–0.2)

## 2018-11-27 MED ORDER — IBUPROFEN 800 MG PO TABS
800.0000 mg | ORAL_TABLET | Freq: Once | ORAL | Status: AC
  Administered 2018-11-28: 800 mg via ORAL
  Filled 2018-11-27: qty 1

## 2018-11-27 MED ORDER — ACETAMINOPHEN 500 MG PO TABS
1000.0000 mg | ORAL_TABLET | Freq: Once | ORAL | Status: AC
  Administered 2018-11-28: 1000 mg via ORAL
  Filled 2018-11-27: qty 2

## 2018-11-27 NOTE — ED Triage Notes (Signed)
Lower abdominal pain today. She states the last time she had this pain she was diagnosed with an ovarian cyst. Nausea.

## 2018-11-27 NOTE — ED Notes (Signed)
per lab, patient's urine test that was drawn had two different patient labels on it. This RN asked patient to recollect urine sample, patient given water and will attempt urine collection in a few minutes.

## 2018-11-28 ENCOUNTER — Emergency Department (HOSPITAL_BASED_OUTPATIENT_CLINIC_OR_DEPARTMENT_OTHER): Payer: BC Managed Care – PPO

## 2018-11-28 ENCOUNTER — Encounter (HOSPITAL_BASED_OUTPATIENT_CLINIC_OR_DEPARTMENT_OTHER): Payer: Self-pay | Admitting: Emergency Medicine

## 2018-11-28 LAB — URINALYSIS, ROUTINE W REFLEX MICROSCOPIC
Bilirubin Urine: NEGATIVE
Glucose, UA: NEGATIVE mg/dL
Hgb urine dipstick: NEGATIVE
Ketones, ur: 15 mg/dL — AB
Leukocytes,Ua: NEGATIVE
Nitrite: NEGATIVE
Protein, ur: NEGATIVE mg/dL
Specific Gravity, Urine: 1.025 (ref 1.005–1.030)
pH: 6 (ref 5.0–8.0)

## 2018-11-28 LAB — PREGNANCY, URINE: Preg Test, Ur: NEGATIVE

## 2018-11-28 MED ORDER — AMOXICILLIN-POT CLAVULANATE 875-125 MG PO TABS: 1.0000 | ORAL_TABLET | Freq: Two times a day (BID) | ORAL | 0 refills | Status: DC

## 2018-11-28 MED ORDER — AMOXICILLIN-POT CLAVULANATE 875-125 MG PO TABS
1.0000 | ORAL_TABLET | Freq: Once | ORAL | Status: AC
  Administered 2018-11-28: 1 via ORAL

## 2018-11-28 MED ORDER — IBUPROFEN 800 MG PO TABS: 800.0000 mg | ORAL_TABLET | Freq: Three times a day (TID) | ORAL | 0 refills | Status: DC

## 2018-11-28 MED ORDER — AMOXICILLIN-POT CLAVULANATE 875-125 MG PO TABS
ORAL_TABLET | ORAL | Status: AC
  Administered 2018-11-28: 01:00:00
  Filled 2018-11-28: qty 1

## 2018-11-28 NOTE — ED Notes (Signed)
UA and urine preg recollected at this time. Culture tube sent to lab as well.

## 2018-11-28 NOTE — ED Notes (Signed)
Patient verbalizes understanding of discharge instructions. Opportunity for questioning and answers were provided. Armband removed by staff, pt discharged from ED.  

## 2018-11-28 NOTE — ED Provider Notes (Signed)
[facility] DEPARTMENT Provider Note   CSN: [id number] Arrival date & time: 11/27/18  2030     History   Chief Complaint Chief Complaint  Patient presents with   Abdominal Pain    HPI Anita Hunter is a 28 y.o. female.     The history is provided by the patient.  Abdominal Pain Pain location:  LLQ Pain quality: aching   Pain radiates to:  Does not radiate Pain severity:  Moderate Onset quality:  Gradual Duration:  1 day Timing:  Constant Progression:  Unchanged Chronicity:  Recurrent Context: not alcohol use, not awakening from sleep, not diet changes, not eating and not laxative use   Relieved by:  Nothing Worsened by:  Nothing Ineffective treatments:  None tried Associated symptoms: no anorexia, no belching, no chest pain, no chills, no constipation, no cough, no diarrhea, no dysuria, no fatigue, no fever, no flatus, no hematemesis, no hematochezia, no hematuria, no melena, no nausea, no shortness of breath, no sore throat, no vaginal bleeding, no vaginal discharge and no vomiting   Risk factors: no alcohol abuse and no aspirin use     Past Medical History:  Diagnosis Date   Back pain    Eczema    Seasonal allergies    Sleep apnea     Patient Active Problem List   Diagnosis Date Noted   Acute non intractable tension-type headache 04/16/2018   Encounter for annual physical exam 04/16/2018   Chronic left-sided low back pain without sciatica 03/19/2018   Seasonal allergic rhinitis 03/19/2018   RLQ abdominal pain 10/24/2016   Obstructive sleep apnea 08/27/2016   Back pain 06/27/2016   Prediabetes 03/02/2015   Sinusitis, chronic 03/25/2014   Tobacco use disorder 06/14/2012   Knee pain, left 06/13/2012   Morbid obesity with BMI of 50.0-59.9, adult ([place]) 04/11/2006   Eczema 04/11/2006    Past Surgical History:  Procedure Laterality Date   NO PAST SURGERIES       OB History    Gravida  0   Para  0   Term    0   Preterm  0   AB  0   Living  0     SAB  0   TAB  0   Ectopic  0   Multiple  0   Live Births  0            Home Medications    Prior to Admission medications   Medication Sig Start Date End Date Taking? Authorizing Provider  amoxicillin-clavulanate (AUGMENTIN) 875-125 MG tablet Take 1 tablet by mouth 2 (two) times daily. One po bid x 7 days 11/28/18   Prentiss Polio, MD  famotidine (PEPCID) 20 MG tablet Take 1 tablet (20 mg total) by mouth 2 (two) times daily. Patient not taking: Reported on 04/16/2018 10/21/16   Charlesetta Shanks, MD  fluticasone [facility]) 50 MCG/ACT nasal spray Place 2 sprays into both nostrils daily. 03/19/18   [place] Bing, DO  ibuprofen (ADVIL) 800 MG tablet Take 1 tablet (800 mg total) by mouth 3 (three) times daily. 11/28/18   Brei Pociask, MD  metFORMIN (GLUCOPHAGE) 500 MG tablet Take 1 tablet (500 mg total) by mouth daily with breakfast. 05/05/18   Drenda Freeze, MD  methocarbamol (ROBAXIN) 500 MG tablet Take 1 tablet (500 mg total) by mouth 2 (two) times daily. Patient not taking: Reported on 04/16/2018 06/24/16   Ashley Murrain, NP  predniSONE (DELTASONE) 20 MG tablet Take 60  mg daily x 2 days then 40 mg daily x 2 days then 20 mg daily x 2 days daily 05/05/18   Drenda Freeze, MD  triamcinolone ointment (KENALOG) 0.1 % Apply 1 application topically 2 (two) times daily. 03/19/18   [place] Bing, DO    Family History Family History  Problem Relation Age of Onset   Hypertension Father    Migraines Father    Asthma Brother        No formal diagnosis as of 06/13/2012   Diabetes Maternal Grandmother    Hypertension Maternal Grandmother    Hypertension Paternal Grandmother    Fibroids Other        Uncertain which family member(s)    Social History Social History   Tobacco Use   Smoking status: Never Smoker   Smokeless tobacco: Never Used  Substance Use Topics   Alcohol use: No   Drug use: No    Types: Marijuana     Comment: marijuna tea     Allergies   Shellfish allergy   Review of Systems Review of Systems  Constitutional: Negative for chills, fatigue and fever.  HENT: Negative for sore throat.   Eyes: Negative for visual disturbance.  Respiratory: Negative for cough and shortness of breath.   Cardiovascular: Negative for chest pain.  Gastrointestinal: Positive for abdominal pain. Negative for anorexia, constipation, diarrhea, flatus, hematemesis, hematochezia, melena, nausea and vomiting.  Genitourinary: Negative for dysuria, hematuria, vaginal bleeding and vaginal discharge.  Musculoskeletal: Negative for arthralgias.  Neurological: Negative for dizziness.  Psychiatric/Behavioral: Negative for agitation.  All other systems reviewed and are negative.    Physical Exam Updated Vital Signs BP (!) 116/56    Pulse 99    Temp 98.6 F (37 C) (Oral)    Resp 16    Ht 5' 9"  (1.753 m)    Wt (!) 164.7 kg    SpO2 96%    BMI 53.61 kg/m   Physical Exam Vitals signs and nursing note reviewed.  Constitutional:      General: She is not in acute distress.    Appearance: She is obese.  HENT:     Head: Normocephalic and atraumatic.     Nose: Nose normal.  Eyes:     Conjunctiva/sclera: Conjunctivae normal.     Pupils: Pupils are equal, round, and reactive to light.  Neck:     Musculoskeletal: Normal range of motion and neck supple.  Cardiovascular:     Rate and Rhythm: Normal rate and regular rhythm.     Pulses: Normal pulses.     Heart sounds: Normal heart sounds.  Pulmonary:     Effort: Pulmonary effort is normal.     Breath sounds: Normal breath sounds.  Abdominal:     General: Abdomen is flat. Bowel sounds are normal.     Tenderness: There is no abdominal tenderness. There is no guarding or rebound.  Musculoskeletal: Normal range of motion.  Skin:    General: Skin is warm and dry.     Capillary Refill: Capillary refill takes less than 2 seconds.  Neurological:     General: No focal  deficit present.     Mental Status: She is alert and oriented to person, place, and time.  Psychiatric:        Mood and Affect: Mood normal.        Behavior: Behavior normal.      ED Treatments / Results  Labs (all labs ordered are listed, but only abnormal results are displayed)  Results for orders placed or performed during the hospital encounter of 11/27/18  Urinalysis, Routine w reflex microscopic  Result Value Ref Range   Color, Urine (A) YELLOW    PATIENT IDENTIFICATION ERROR. PLEASE DISREGARD RESULTS. ACCOUNT WILL BE CREDITED.   APPearance (A) CLEAR    PATIENT IDENTIFICATION ERROR. PLEASE DISREGARD RESULTS. ACCOUNT WILL BE CREDITED.   Specific Gravity, Urine  1.005 - 1.030    PATIENT IDENTIFICATION ERROR. PLEASE DISREGARD RESULTS. ACCOUNT WILL BE CREDITED.   pH  5.0 - 8.0    PATIENT IDENTIFICATION ERROR. PLEASE DISREGARD RESULTS. ACCOUNT WILL BE CREDITED.   Glucose, UA (A) NEGATIVE mg/dL    PATIENT IDENTIFICATION ERROR. PLEASE DISREGARD RESULTS. ACCOUNT WILL BE CREDITED.   Hgb urine dipstick (A) NEGATIVE    PATIENT IDENTIFICATION ERROR. PLEASE DISREGARD RESULTS. ACCOUNT WILL BE CREDITED.   Bilirubin Urine (A) NEGATIVE    PATIENT IDENTIFICATION ERROR. PLEASE DISREGARD RESULTS. ACCOUNT WILL BE CREDITED.   Ketones, ur (A) NEGATIVE mg/dL    PATIENT IDENTIFICATION ERROR. PLEASE DISREGARD RESULTS. ACCOUNT WILL BE CREDITED.   Protein, ur (A) NEGATIVE mg/dL    PATIENT IDENTIFICATION ERROR. PLEASE DISREGARD RESULTS. ACCOUNT WILL BE CREDITED.   Nitrite (A) NEGATIVE    PATIENT IDENTIFICATION ERROR. PLEASE DISREGARD RESULTS. ACCOUNT WILL BE CREDITED.   Leukocytes,Ua (A) NEGATIVE    PATIENT IDENTIFICATION ERROR. PLEASE DISREGARD RESULTS. ACCOUNT WILL BE CREDITED.  CBC with Differential  Result Value Ref Range   WBC 8.1 4.0 - 10.5 K/uL   RBC 4.91 3.87 - 5.11 MIL/uL   Hemoglobin 13.2 12.0 - 15.0 g/dL   HCT 44.1 36.0 - 46.0 %   MCV 89.8 80.0 - 100.0 fL   MCH 26.9 26.0 - 34.0 pg    MCHC 29.9 (L) 30.0 - 36.0 g/dL   RDW 12.5 11.5 - 15.5 %   Platelets 235 150 - 400 K/uL   nRBC 0.0 0.0 - 0.2 %   Neutrophils Relative % 65 %   Neutro Abs 5.2 1.7 - 7.7 K/uL   Lymphocytes Relative 26 %   Lymphs Abs 2.1 0.7 - 4.0 K/uL   Monocytes Relative 7 %   Monocytes Absolute 0.5 0.1 - 1.0 K/uL   Eosinophils Relative 2 %   Eosinophils Absolute 0.1 0.0 - 0.5 K/uL   Basophils Relative 0 %   Basophils Absolute 0.0 0.0 - 0.1 K/uL   Immature Granulocytes 0 %   Abs Immature Granulocytes 0.03 0.00 - 0.07 K/uL  Comprehensive metabolic panel  Result Value Ref Range   Sodium 135 135 - 145 mmol/L   Potassium 4.0 3.5 - 5.1 mmol/L   Chloride 98 98 - 111 mmol/L   CO2 28 22 - 32 mmol/L   Glucose, Bld 192 (H) 70 - 99 mg/dL   BUN 10 6 - 20 mg/dL   Creatinine, Ser 0.67 0.44 - 1.00 mg/dL   Calcium 8.2 (L) 8.9 - 10.3 mg/dL   Total Protein 8.3 (H) 6.5 - 8.1 g/dL   Albumin 3.8 3.5 - 5.0 g/dL   AST 88 (H) 15 - 41 U/L   ALT 72 (H) 0 - 44 U/L   Alkaline Phosphatase 80 38 - 126 U/L   Total Bilirubin 0.5 0.3 - 1.2 mg/dL   GFR calc non Af Amer >60 >60 mL/min   GFR calc Af Amer >60 >60 mL/min   Anion gap 9 5 - 15  Pregnancy, urine  Result Value Ref Range   Preg Test, Ur NEGATIVE NEGATIVE  Urinalysis, Routine w reflex microscopic  Result Value Ref Range   Color, Urine YELLOW YELLOW   APPearance CLOUDY (A) CLEAR   Specific Gravity, Urine 1.025 1.005 - 1.030   pH 6.0 5.0 - 8.0   Glucose, UA NEGATIVE NEGATIVE mg/dL   Hgb urine dipstick NEGATIVE NEGATIVE   Bilirubin Urine NEGATIVE NEGATIVE   Ketones, ur 15 (A) NEGATIVE mg/dL   Protein, ur NEGATIVE NEGATIVE mg/dL   Nitrite NEGATIVE NEGATIVE   Leukocytes,Ua NEGATIVE NEGATIVE   Ct Renal Stone Study  Result Date: 11/28/2018 CLINICAL DATA:  Bilateral lower abdominal pain for 2 days EXAM: CT ABDOMEN AND PELVIS WITHOUT CONTRAST TECHNIQUE: Multidetector CT imaging of the abdomen and pelvis was performed following the standard protocol without IV  contrast. COMPARISON:  CT abdomen pelvis 10/23/2016 FINDINGS: Lower chest: Lung bases are clear. Normal heart size. No pericardial effusion. Hepatobiliary: Diffuse hepatic hypoattenuation compatible with hepatic steatosis. Sparing seen along the gallbladder fossa. No concerning focal liver abnormality is seen. No gallstones, gallbladder wall thickening, or biliary dilatation. Pancreas: Unremarkable. No pancreatic ductal dilatation or surrounding inflammatory changes. Spleen: Normal in size without focal abnormality. Adrenals/Urinary Tract: Adrenal glands are unremarkable. Kidneys are normal, without renal calculi, focal lesion, or hydronephrosis. Bladder is largely decompressed at the time of exam and therefore poorly evaluated by CT though no gross abnormality is identified. Stomach/Bowel: Distal esophagus, stomach and duodenal sweep are unremarkable. No small bowel wall thickening or dilatation. No evidence of obstruction. A normal appendix is visualized. Segmental thickening and pericolonic stranding of the sigmoid colon. Fall several colonic diverticula are present in the area of inflammation. Inflammation does not appear centered upon a single culprit diverticulum. No extraluminal gas, no organized collection or abscess. Remaining portions of the colon are otherwise unremarkable. Vascular/Lymphatic: The aorta is normal caliber. No suspicious or enlarged lymph nodes in the included lymphatic chains. Reproductive: Normal appearance of the uterus and adnexal structures. Other: No abdominopelvic free fluid or free gas. No bowel containing hernias. Mild posterior body wall edema. Musculoskeletal: No acute osseous abnormality or suspicious osseous lesion. IMPRESSION: 1. Segmental thickening of the sigmoid colon. While there is distal colonic diverticulosis, inflammation does not appear focally centered upon a diverticular outpouching, favoring a segmental colitis of either inflammatory or infectious etiology.  Diverticulitis is a possibility but is less favored. No evidence of perforation or abscess formation. 2. Hepatic steatosis. Electronically Signed   By: Lovena Le M.D.   On: 11/28/2018 00:51    EKG None  Radiology Ct Renal Stone Study  Result Date: 11/28/2018 CLINICAL DATA:  Bilateral lower abdominal pain for 2 days EXAM: CT ABDOMEN AND PELVIS WITHOUT CONTRAST TECHNIQUE: Multidetector CT imaging of the abdomen and pelvis was performed following the standard protocol without IV contrast. COMPARISON:  CT abdomen pelvis 10/23/2016 FINDINGS: Lower chest: Lung bases are clear. Normal heart size. No pericardial effusion. Hepatobiliary: Diffuse hepatic hypoattenuation compatible with hepatic steatosis. Sparing seen along the gallbladder fossa. No concerning focal liver abnormality is seen. No gallstones, gallbladder wall thickening, or biliary dilatation. Pancreas: Unremarkable. No pancreatic ductal dilatation or surrounding inflammatory changes. Spleen: Normal in size without focal abnormality. Adrenals/Urinary Tract: Adrenal glands are unremarkable. Kidneys are normal, without renal calculi, focal lesion, or hydronephrosis. Bladder is largely decompressed at the time of exam and therefore poorly evaluated by CT though no gross abnormality is identified. Stomach/Bowel: Distal esophagus, stomach and duodenal sweep are unremarkable. No small bowel wall thickening or dilatation. No evidence of obstruction. A normal appendix is visualized. Segmental thickening and pericolonic stranding of the sigmoid  colon. Fall several colonic diverticula are present in the area of inflammation. Inflammation does not appear centered upon a single culprit diverticulum. No extraluminal gas, no organized collection or abscess. Remaining portions of the colon are otherwise unremarkable. Vascular/Lymphatic: The aorta is normal caliber. No suspicious or enlarged lymph nodes in the included lymphatic chains. Reproductive: Normal  appearance of the uterus and adnexal structures. Other: No abdominopelvic free fluid or free gas. No bowel containing hernias. Mild posterior body wall edema. Musculoskeletal: No acute osseous abnormality or suspicious osseous lesion. IMPRESSION: 1. Segmental thickening of the sigmoid colon. While there is distal colonic diverticulosis, inflammation does not appear focally centered upon a diverticular outpouching, favoring a segmental colitis of either inflammatory or infectious etiology. Diverticulitis is a possibility but is less favored. No evidence of perforation or abscess formation. 2. Hepatic steatosis. Electronically Signed   By: Lovena Le M.D.   On: 11/28/2018 00:51    Procedures Procedures (including critical care time)  Medications Ordered in ED Medications  amoxicillin-clavulanate (AUGMENTIN) 875-125 MG per tablet 1 tablet (has no administration in time range)  acetaminophen (TYLENOL) tablet 1,000 mg (1,000 mg Oral Given 11/28/18 0021)  ibuprofen (ADVIL) tablet 800 mg (800 mg Oral Given 11/28/18 0021)     Exam and vitals are benign and reassuring.  CT consistent with colitis, patient denied bleeding.  Will treat with augmentin and NSAIDS and bland, low residue diet.  Strict return precautions given.  Anita Hunter was evaluated in Emergency Department on 11/28/2018 for the symptoms described in the history of present illness. She was evaluated in the context of the global COVID-19 pandemic, which necessitated consideration that the patient might be at risk for infection with the SARS-CoV-2 virus that causes COVID-19. Institutional protocols and algorithms that pertain to the evaluation of patients at risk for COVID-19 are in a state of rapid change based on information released by regulatory bodies including the CDC and federal and state organizations. These policies and algorithms were followed during the patient's care in the ED.   Final Clinical Impressions(s) / ED Diagnoses     Final diagnoses:  Colitis   Return for weakness, numbness, changes in vision or speech, fevers >100.4 unrelieved by medication, shortness of breath, intractable vomiting, or diarrhea, abdominal pain, Inability to tolerate liquids or food, cough, altered mental status or any concerns. No signs of systemic illness or infection. The patient is nontoxic-appearing on exam and vital signs are within normal limits.   I have reviewed the triage vital signs and the nursing notes. Pertinent labs &imaging results that were available during my care of the patient were reviewed by me and considered in my medical decision making (see chart for details).  After history, exam, and medical workup I feel the patient has been appropriately medically screened and is safe for discharge home. Pertinent diagnoses were discussed with the patient. Patient was given return precautions.  ED Discharge Orders         Ordered    amoxicillin-clavulanate (AUGMENTIN) 875-125 MG tablet  2 times daily     11/28/18 0100    ibuprofen (ADVIL) 800 MG tablet  3 times daily     11/28/18 0100           [place] Methvin, MD 11/28/18 0107

## 2018-12-01 ENCOUNTER — Ambulatory Visit: Payer: BLUE CROSS/BLUE SHIELD

## 2019-01-12 ENCOUNTER — Other Ambulatory Visit: Payer: Self-pay

## 2019-01-12 ENCOUNTER — Encounter: Payer: Self-pay | Admitting: Emergency Medicine

## 2019-01-12 ENCOUNTER — Ambulatory Visit
Admission: EM | Admit: 2019-01-12 | Discharge: 2019-01-12 | Disposition: A | Discharge status: Home / Self Care | Payer: BC Managed Care – PPO | Attending: Emergency Medicine | Admitting: Emergency Medicine

## 2019-01-12 DIAGNOSIS — J069 Acute upper respiratory infection, unspecified: Secondary | ICD-10-CM | POA: Diagnosis not present

## 2019-01-12 DIAGNOSIS — Z20828 Contact with and (suspected) exposure to other viral communicable diseases: Secondary | ICD-10-CM

## 2019-01-12 LAB — POC SARS CORONAVIRUS 2 AG -  ED: SARS Coronavirus 2 Ag: NEGATIVE

## 2019-01-12 NOTE — ED Provider Notes (Signed)
[name]    CSN: [id number] Arrival date & time: 01/12/19  1660      History   Chief Complaint Chief Complaint  Patient presents with  . Cough    HPI Anita Hunter is a 28 y.o. female with history of obesity, sleep apnea, hypertension presenting for 1 week course of dry, nonproductive, nonhemoptic cough, itchy/scratchy throat,, sneezing, nasal congestion, fatigue.  [place]: No known sick contacts.  Patient denies chest pain, shortness of breath, wheezing.  Has tried Zyrtec, Flonase with moderate relief of symptoms.   Past Medical History:  Diagnosis Date  . Back pain   . Eczema   . Seasonal allergies   . Sleep apnea     Patient Active Problem List   Diagnosis Date Noted  . Acute non intractable tension-type headache 04/16/2018  . Encounter for annual physical exam 04/16/2018  . Chronic left-sided low back pain without sciatica 03/19/2018  . Seasonal allergic rhinitis 03/19/2018  . RLQ abdominal pain 10/24/2016  . Obstructive sleep apnea 08/27/2016  . Back pain 06/27/2016  . Prediabetes 03/02/2015  . Sinusitis, chronic 03/25/2014  . Tobacco use disorder 06/14/2012  . Knee pain, left 06/13/2012  . Morbid obesity with BMI of 50.0-59.9, adult ([place]) 04/11/2006  . Eczema 04/11/2006    Past Surgical History:  Procedure Laterality Date  . NO PAST SURGERIES      OB History    Gravida  0   Para  0   Term  0   Preterm  0   AB  0   Living  0     SAB  0   TAB  0   Ectopic  0   Multiple  0   Live Births  0            Home Medications    Prior to Admission medications   Medication Sig Start Date End Date Taking? Authorizing Provider  famotidine (PEPCID) 20 MG tablet Take 1 tablet (20 mg total) by mouth 2 (two) times daily. Patient not taking: Reported on 04/16/2018 10/21/16   Charlesetta Shanks, MD  fluticasone [facility]) 50 MCG/ACT nasal spray Place 2 sprays into both nostrils  daily. 03/19/18   [place] Bing, DO  ibuprofen (ADVIL) 800 MG tablet Take 1 tablet (800 mg total) by mouth 3 (three) times daily. 11/28/18   [place], April, MD  metFORMIN (GLUCOPHAGE) 500 MG tablet Take 1 tablet (500 mg total) by mouth daily with breakfast. 05/05/18   Drenda Freeze, MD  triamcinolone ointment (KENALOG) 0.1 % Apply 1 application topically 2 (two) times daily. 03/19/18   [place] Bing, DO    Family History Family History  Problem Relation Age of Onset  . Hypertension Father   . Migraines Father   . Asthma Brother        No formal diagnosis as of 06/13/2012  . Diabetes Maternal Grandmother   . Hypertension Maternal Grandmother   . Hypertension Paternal Grandmother   . Fibroids Other        Uncertain which family member(s)    Social History Social History   Tobacco Use  . Smoking status: Never Smoker  . Smokeless tobacco: Never Used  Substance Use Topics  . Alcohol use: No  . Drug use: No    Types: Marijuana    Comment: marijuna tea     Allergies   Shellfish allergy   Review of Systems Review of Systems  Constitutional:  Positive for fatigue. Negative for fever.  HENT: Positive for congestion and sore throat. Negative for ear pain, sinus pain and voice change.   Eyes: Negative for pain, redness and visual disturbance.  Respiratory: Positive for cough. Negative for shortness of breath and wheezing.   Cardiovascular: Negative for chest pain and palpitations.  Gastrointestinal: Negative for abdominal pain, diarrhea and vomiting.  Musculoskeletal: Positive for myalgias. Negative for arthralgias.  Skin: Negative for rash and wound.  Neurological: Negative for syncope and headaches.     Physical Exam Triage Vital Signs ED Triage Vitals  Enc Vitals Group     BP 01/12/19 0950 (!) 168/106     Pulse Rate 01/12/19 0950 90     Resp 01/12/19 0950 18     Temp 01/12/19 0950 (!) 97.4 F (36.3 C)     Temp Source 01/12/19 0950 Temporal     SpO2 01/12/19  0950 91 %     Weight --      Height --      Head Circumference --      Peak Flow --      Pain Score 01/12/19 0951 7     Pain Loc --      Pain Edu? --      Excl. in [place]? --    No data found.  Updated Vital Signs BP (!) 168/106 (BP Location: Left Wrist)   Pulse 90   Temp (!) 97.4 F (36.3 C) (Temporal)   Resp 18   SpO2 91%   Visual Acuity Right Eye Distance:   Left Eye Distance:   Bilateral Distance:    Right Eye Near:   Left Eye Near:    Bilateral Near:     Physical Exam Constitutional:      General: She is not in acute distress.    Appearance: She is obese. She is not ill-appearing.  HENT:     Head: Normocephalic and atraumatic.     Right Ear: Tympanic membrane, ear canal and external ear normal.     Left Ear: Tympanic membrane, ear canal and external ear normal.     Nose: Nose normal.     Mouth/Throat:     Mouth: Mucous membranes are moist.     Pharynx: Oropharynx is clear. No oropharyngeal exudate or posterior oropharyngeal erythema.  Eyes:     General: No scleral icterus.       Right eye: No discharge.        Left eye: No discharge.     Conjunctiva/sclera: Conjunctivae normal.     Pupils: Pupils are equal, round, and reactive to light.  Neck:     Musculoskeletal: Neck supple. No muscular tenderness.  Cardiovascular:     Rate and Rhythm: Normal rate and regular rhythm.  Pulmonary:     Effort: Pulmonary effort is normal. No respiratory distress.     Breath sounds: No wheezing, rhonchi or rales.  Lymphadenopathy:     Cervical: No cervical adenopathy.  Skin:    Capillary Refill: Capillary refill takes less than 2 seconds.     Coloration: Skin is not jaundiced or pale.  Neurological:     General: No focal deficit present.     Mental Status: She is alert and oriented to person, place, and time.      UC Treatments / Results  Labs (all labs ordered are listed, but only abnormal results are displayed) Labs Reviewed  POC SARS CORONAVIRUS 2 AG -  ED -  Normal  NOVEL CORONAVIRUS, NAA  EKG   Radiology No results found.  Procedures Procedures (including critical care time)  Medications Ordered in UC Medications - No data to display  Initial Impression / Assessment and Plan / UC Course  I have reviewed the triage vital signs and the nursing notes.  Pertinent labs & imaging results that were available during my care of the patient were reviewed by me and considered in my medical decision making (see chart for details).     Rapid Covid done in office, reviewed by me: Negative, PCR pending.  Patient to quarantine till results are back.  Patient to continue supportive treatment.  Return precautions discussed, patient verbalized understanding and is agreeable to plan. Final Clinical Impressions(s) / UC Diagnoses   Final diagnoses:  Viral URI with cough     Discharge Instructions     Your COVID test is pending - it is important to quarantine / isolate at home until your results are back. If you test positive and would like further evaluation for persistent or worsening symptoms, you may schedule an E-visit or virtual (video) visit throughout the [facility] app or website.  PLEASE NOTE: If you develop severe chest pain or shortness of breath please go to the ER or call [phone] for further evaluation --> DO NOT schedule electronic or virtual visits for this. Please call our office for further guidance / recommendations as needed.    ED Prescriptions    None     PDMP not reviewed this encounter.   Hall-Potvin, [place], [place] 01/12/19 1113

## 2019-01-12 NOTE — ED Triage Notes (Signed)
Pt presents to [facility] for assessment of 1 week of cough, shortness of breath, itchy throat, body aches, nasal congestion, and fatigue.

## 2019-01-12 NOTE — ED Notes (Signed)
Patient able to ambulate independently  

## 2019-01-12 NOTE — Discharge Instructions (Addendum)
Your COVID test is pending - it is important to quarantine / isolate at home until your results are back. °If you test positive and would like further evaluation for persistent or worsening symptoms, you may schedule an E-visit or virtual (video) visit throughout the [facility] MyChart app or website. ° °PLEASE NOTE: If you develop severe chest pain or shortness of breath please go to the ER or call 9-1-1 for further evaluation --> DO NOT schedule electronic or virtual visits for this. °Please call our office for further guidance / recommendations as needed. °

## 2019-01-13 LAB — NOVEL CORONAVIRUS, NAA: SARS-CoV-2, NAA: NOT DETECTED

## 2019-01-26 ENCOUNTER — Encounter (HOSPITAL_COMMUNITY): Payer: Self-pay | Admitting: Emergency Medicine

## 2019-01-26 ENCOUNTER — Ambulatory Visit (INDEPENDENT_AMBULATORY_CARE_PROVIDER_SITE_OTHER)
Admission: EM | Admit: 2019-01-26 | Discharge: 2019-01-26 | Disposition: A | Discharge status: Home / Self Care | Payer: BC Managed Care – PPO | Source: Home / Self Care

## 2019-01-26 ENCOUNTER — Other Ambulatory Visit: Payer: Self-pay

## 2019-01-26 ENCOUNTER — Emergency Department (HOSPITAL_COMMUNITY): Payer: BC Managed Care – PPO

## 2019-01-26 ENCOUNTER — Inpatient Hospital Stay (HOSPITAL_COMMUNITY)
Admission: EM | Admit: 2019-01-26 | Discharge: 2019-02-20 | DRG: 207 | Disposition: A | Discharge status: Rehabilitation Facility | Payer: BC Managed Care – PPO | Source: Ambulatory Visit | Attending: Internal Medicine | Admitting: Internal Medicine

## 2019-01-26 ENCOUNTER — Encounter: Payer: Self-pay | Admitting: Emergency Medicine

## 2019-01-26 DIAGNOSIS — D62 Acute posthemorrhagic anemia: Secondary | ICD-10-CM | POA: Diagnosis not present

## 2019-01-26 DIAGNOSIS — Z825 Family history of asthma and other chronic lower respiratory diseases: Secondary | ICD-10-CM

## 2019-01-26 DIAGNOSIS — M792 Neuralgia and neuritis, unspecified: Secondary | ICD-10-CM | POA: Diagnosis not present

## 2019-01-26 DIAGNOSIS — I5023 Acute on chronic systolic (congestive) heart failure: Secondary | ICD-10-CM | POA: Diagnosis not present

## 2019-01-26 DIAGNOSIS — R5381 Other malaise: Secondary | ICD-10-CM | POA: Diagnosis not present

## 2019-01-26 DIAGNOSIS — E1140 Type 2 diabetes mellitus with diabetic neuropathy, unspecified: Secondary | ICD-10-CM | POA: Diagnosis present

## 2019-01-26 DIAGNOSIS — U071 COVID-19: Secondary | ICD-10-CM

## 2019-01-26 DIAGNOSIS — R0682 Tachypnea, not elsewhere classified: Secondary | ICD-10-CM

## 2019-01-26 DIAGNOSIS — I429 Cardiomyopathy, unspecified: Secondary | ICD-10-CM | POA: Diagnosis not present

## 2019-01-26 DIAGNOSIS — J4520 Mild intermittent asthma, uncomplicated: Secondary | ICD-10-CM | POA: Diagnosis present

## 2019-01-26 DIAGNOSIS — J80 Acute respiratory distress syndrome: Secondary | ICD-10-CM | POA: Diagnosis present

## 2019-01-26 DIAGNOSIS — R0902 Hypoxemia: Secondary | ICD-10-CM

## 2019-01-26 DIAGNOSIS — G8929 Other chronic pain: Secondary | ICD-10-CM | POA: Diagnosis present

## 2019-01-26 DIAGNOSIS — Z978 Presence of other specified devices: Secondary | ICD-10-CM

## 2019-01-26 DIAGNOSIS — Y92230 Patient room in hospital as the place of occurrence of the external cause: Secondary | ICD-10-CM | POA: Diagnosis not present

## 2019-01-26 DIAGNOSIS — E119 Type 2 diabetes mellitus without complications: Secondary | ICD-10-CM

## 2019-01-26 DIAGNOSIS — F419 Anxiety disorder, unspecified: Secondary | ICD-10-CM | POA: Diagnosis present

## 2019-01-26 DIAGNOSIS — A4151 Sepsis due to Escherichia coli [E. coli]: Secondary | ICD-10-CM | POA: Diagnosis not present

## 2019-01-26 DIAGNOSIS — Y95 Nosocomial condition: Secondary | ICD-10-CM | POA: Diagnosis not present

## 2019-01-26 DIAGNOSIS — R000 Tachycardia, unspecified: Secondary | ICD-10-CM | POA: Diagnosis not present

## 2019-01-26 DIAGNOSIS — M549 Dorsalgia, unspecified: Secondary | ICD-10-CM | POA: Diagnosis not present

## 2019-01-26 DIAGNOSIS — N390 Urinary tract infection, site not specified: Secondary | ICD-10-CM | POA: Diagnosis not present

## 2019-01-26 DIAGNOSIS — D649 Anemia, unspecified: Secondary | ICD-10-CM | POA: Diagnosis not present

## 2019-01-26 DIAGNOSIS — Z6841 Body Mass Index (BMI) 40.0 and over, adult: Secondary | ICD-10-CM | POA: Diagnosis not present

## 2019-01-26 DIAGNOSIS — E781 Pure hyperglyceridemia: Secondary | ICD-10-CM | POA: Diagnosis present

## 2019-01-26 DIAGNOSIS — J1289 Other viral pneumonia: Secondary | ICD-10-CM | POA: Diagnosis present

## 2019-01-26 DIAGNOSIS — N179 Acute kidney failure, unspecified: Secondary | ICD-10-CM | POA: Diagnosis not present

## 2019-01-26 DIAGNOSIS — R069 Unspecified abnormalities of breathing: Secondary | ICD-10-CM

## 2019-01-26 DIAGNOSIS — Z7984 Long term (current) use of oral hypoglycemic drugs: Secondary | ICD-10-CM

## 2019-01-26 DIAGNOSIS — J9621 Acute and chronic respiratory failure with hypoxia: Secondary | ICD-10-CM | POA: Diagnosis not present

## 2019-01-26 DIAGNOSIS — Z833 Family history of diabetes mellitus: Secondary | ICD-10-CM

## 2019-01-26 DIAGNOSIS — D72829 Elevated white blood cell count, unspecified: Secondary | ICD-10-CM | POA: Diagnosis not present

## 2019-01-26 DIAGNOSIS — E870 Hyperosmolality and hypernatremia: Secondary | ICD-10-CM | POA: Diagnosis not present

## 2019-01-26 DIAGNOSIS — Z79899 Other long term (current) drug therapy: Secondary | ICD-10-CM

## 2019-01-26 DIAGNOSIS — E869 Volume depletion, unspecified: Secondary | ICD-10-CM | POA: Diagnosis present

## 2019-01-26 DIAGNOSIS — Z8679 Personal history of other diseases of the circulatory system: Secondary | ICD-10-CM | POA: Clinically undetermined

## 2019-01-26 DIAGNOSIS — R0602 Shortness of breath: Secondary | ICD-10-CM | POA: Diagnosis not present

## 2019-01-26 DIAGNOSIS — G4733 Obstructive sleep apnea (adult) (pediatric): Secondary | ICD-10-CM | POA: Diagnosis present

## 2019-01-26 DIAGNOSIS — R490 Dysphonia: Secondary | ICD-10-CM | POA: Diagnosis not present

## 2019-01-26 DIAGNOSIS — J9601 Acute respiratory failure with hypoxia: Secondary | ICD-10-CM | POA: Diagnosis not present

## 2019-01-26 DIAGNOSIS — E876 Hypokalemia: Secondary | ICD-10-CM | POA: Diagnosis not present

## 2019-01-26 DIAGNOSIS — Z9911 Dependence on respirator [ventilator] status: Secondary | ICD-10-CM | POA: Diagnosis not present

## 2019-01-26 DIAGNOSIS — J15211 Pneumonia due to Methicillin susceptible Staphylococcus aureus: Secondary | ICD-10-CM | POA: Diagnosis not present

## 2019-01-26 DIAGNOSIS — E1165 Type 2 diabetes mellitus with hyperglycemia: Secondary | ICD-10-CM | POA: Diagnosis present

## 2019-01-26 DIAGNOSIS — D72825 Bandemia: Secondary | ICD-10-CM | POA: Diagnosis not present

## 2019-01-26 DIAGNOSIS — Z9989 Dependence on other enabling machines and devices: Secondary | ICD-10-CM

## 2019-01-26 DIAGNOSIS — K5900 Constipation, unspecified: Secondary | ICD-10-CM | POA: Diagnosis not present

## 2019-01-26 DIAGNOSIS — R0600 Dyspnea, unspecified: Secondary | ICD-10-CM | POA: Diagnosis not present

## 2019-01-26 DIAGNOSIS — R1110 Vomiting, unspecified: Secondary | ICD-10-CM

## 2019-01-26 DIAGNOSIS — I110 Hypertensive heart disease with heart failure: Secondary | ICD-10-CM | POA: Diagnosis present

## 2019-01-26 DIAGNOSIS — K7581 Nonalcoholic steatohepatitis (NASH): Secondary | ICD-10-CM | POA: Diagnosis present

## 2019-01-26 DIAGNOSIS — Z01818 Encounter for other preprocedural examination: Secondary | ICD-10-CM

## 2019-01-26 DIAGNOSIS — Z91013 Allergy to seafood: Secondary | ICD-10-CM

## 2019-01-26 DIAGNOSIS — Z8249 Family history of ischemic heart disease and other diseases of the circulatory system: Secondary | ICD-10-CM

## 2019-01-26 DIAGNOSIS — J690 Pneumonitis due to inhalation of food and vomit: Secondary | ICD-10-CM | POA: Diagnosis not present

## 2019-01-26 DIAGNOSIS — E871 Hypo-osmolality and hyponatremia: Secondary | ICD-10-CM | POA: Diagnosis not present

## 2019-01-26 DIAGNOSIS — Z8616 Personal history of COVID-19: Secondary | ICD-10-CM | POA: Diagnosis not present

## 2019-01-26 DIAGNOSIS — T380X5A Adverse effect of glucocorticoids and synthetic analogues, initial encounter: Secondary | ICD-10-CM | POA: Diagnosis not present

## 2019-01-26 DIAGNOSIS — G6281 Critical illness polyneuropathy: Secondary | ICD-10-CM | POA: Diagnosis not present

## 2019-01-26 DIAGNOSIS — I5021 Acute systolic (congestive) heart failure: Secondary | ICD-10-CM | POA: Diagnosis not present

## 2019-01-26 DIAGNOSIS — Z4659 Encounter for fitting and adjustment of other gastrointestinal appliance and device: Secondary | ICD-10-CM

## 2019-01-26 DIAGNOSIS — J9622 Acute and chronic respiratory failure with hypercapnia: Secondary | ICD-10-CM | POA: Diagnosis not present

## 2019-01-26 DIAGNOSIS — J1282 Pneumonia due to coronavirus disease 2019: Secondary | ICD-10-CM | POA: Diagnosis not present

## 2019-01-26 HISTORY — DX: Type 2 diabetes mellitus without complications: E11.9

## 2019-01-26 LAB — HEPATIC FUNCTION PANEL
ALT: 156 U/L — ABNORMAL HIGH (ref 0–44)
AST: 287 U/L — ABNORMAL HIGH (ref 15–41)
Albumin: 3.8 g/dL (ref 3.5–5.0)
Alkaline Phosphatase: 75 U/L (ref 38–126)
Bilirubin, Direct: 0.1 mg/dL (ref 0.0–0.2)
Indirect Bilirubin: 0.3 mg/dL (ref 0.3–0.9)
Total Bilirubin: 0.4 mg/dL (ref 0.3–1.2)
Total Protein: 8.5 g/dL — ABNORMAL HIGH (ref 6.5–8.1)

## 2019-01-26 LAB — CBC WITH DIFFERENTIAL/PLATELET
Abs Immature Granulocytes: 0.05 10*3/uL (ref 0.00–0.07)
Basophils Absolute: 0 10*3/uL (ref 0.0–0.1)
Basophils Relative: 0 %
Eosinophils Absolute: 0 10*3/uL (ref 0.0–0.5)
Eosinophils Relative: 0 %
HCT: 43.5 % (ref 36.0–46.0)
Hemoglobin: 13.4 g/dL (ref 12.0–15.0)
Immature Granulocytes: 1 %
Lymphocytes Relative: 9 %
Lymphs Abs: 0.6 10*3/uL — ABNORMAL LOW (ref 0.7–4.0)
MCH: 27.6 pg (ref 26.0–34.0)
MCHC: 30.8 g/dL (ref 30.0–36.0)
MCV: 89.5 fL (ref 80.0–100.0)
Monocytes Absolute: 0.4 10*3/uL (ref 0.1–1.0)
Monocytes Relative: 6 %
Neutro Abs: 5.3 10*3/uL (ref 1.7–7.7)
Neutrophils Relative %: 84 %
Platelets: 196 10*3/uL (ref 150–400)
RBC: 4.86 MIL/uL (ref 3.87–5.11)
RDW: 12.7 % (ref 11.5–15.5)
WBC: 6.3 10*3/uL (ref 4.0–10.5)
nRBC: 0 % (ref 0.0–0.2)

## 2019-01-26 LAB — PHOSPHORUS: Phosphorus: 2.8 mg/dL (ref 2.5–4.6)

## 2019-01-26 LAB — PROCALCITONIN: Procalcitonin: <0.1 ng/mL

## 2019-01-26 LAB — FERRITIN: Ferritin: 562 ng/mL — ABNORMAL HIGH (ref 11–307)

## 2019-01-26 LAB — C-REACTIVE PROTEIN: CRP: 4.8 mg/dL — ABNORMAL HIGH (ref <1.0)

## 2019-01-26 LAB — POC SARS CORONAVIRUS 2 AG -  ED: SARS Coronavirus 2 Ag: POSITIVE — AB

## 2019-01-26 LAB — BASIC METABOLIC PANEL
Anion gap: 11 (ref 5–15)
BUN: 7 mg/dL (ref 6–20)
CO2: 28 mmol/L (ref 22–32)
Calcium: 8.3 mg/dL — ABNORMAL LOW (ref 8.9–10.3)
Chloride: 96 mmol/L — ABNORMAL LOW (ref 98–111)
Creatinine, Ser: 0.72 mg/dL (ref 0.44–1.00)
GFR calc Af Amer: >60 mL/min (ref >60)
GFR calc non Af Amer: >60 mL/min (ref >60)
Glucose, Bld: 257 mg/dL — ABNORMAL HIGH (ref 70–99)
Potassium: 4.3 mmol/L (ref 3.5–5.1)
Sodium: 135 mmol/L (ref 135–145)

## 2019-01-26 LAB — HEMOGLOBIN A1C
Hgb A1c MFr Bld: 11.1 % — ABNORMAL HIGH (ref 4.8–5.6)
Mean Plasma Glucose: 271.87 mg/dL

## 2019-01-26 LAB — GLUCOSE, CAPILLARY
Glucose-Capillary: 317 mg/dL — ABNORMAL HIGH (ref 70–99)
Glucose-Capillary: 369 mg/dL — ABNORMAL HIGH (ref 70–99)

## 2019-01-26 LAB — HIV ANTIBODY (ROUTINE TESTING W REFLEX): HIV Screen 4th Generation wRfx: NONREACTIVE

## 2019-01-26 LAB — D-DIMER, QUANTITATIVE: D-Dimer, Quant: 0.53 ug/mL-FEU — ABNORMAL HIGH (ref 0.00–0.50)

## 2019-01-26 LAB — ABO/RH: ABO/RH(D): O POS

## 2019-01-26 LAB — I-STAT BETA HCG BLOOD, ED (MC, WL, AP ONLY): I-stat hCG, quantitative: <5 m[IU]/mL (ref <5)

## 2019-01-26 LAB — BRAIN NATRIURETIC PEPTIDE: B Natriuretic Peptide: 10.5 pg/mL (ref 0.0–100.0)

## 2019-01-26 LAB — FIBRINOGEN: Fibrinogen: 563 mg/dL — ABNORMAL HIGH (ref 210–475)

## 2019-01-26 LAB — MAGNESIUM: Magnesium: 1.5 mg/dL — ABNORMAL LOW (ref 1.7–2.4)

## 2019-01-26 MED ORDER — METHYLPREDNISOLONE SODIUM SUCC 125 MG IJ SOLR
80.0000 mg | Freq: Two times a day (BID) | INTRAMUSCULAR | Status: DC
  Administered 2019-01-27 – 2019-01-30 (×7): 80 mg via INTRAVENOUS
  Filled 2019-01-26 (×8): qty 2

## 2019-01-26 MED ORDER — PROCHLORPERAZINE EDISYLATE 10 MG/2ML IJ SOLN: 10.0000 mg | INTRAMUSCULAR | Status: DC | PRN

## 2019-01-26 MED ORDER — GUAIFENESIN-DM 100-10 MG/5ML PO SYRP
10.0000 mL | ORAL_SOLUTION | ORAL | Status: DC | PRN
  Administered 2019-01-26 – 2019-01-29 (×8): 10 mL via ORAL
  Filled 2019-01-26 (×8): qty 10

## 2019-01-26 MED ORDER — ENOXAPARIN SODIUM 80 MG/0.8ML ~~LOC~~ SOLN
80.0000 mg | SUBCUTANEOUS | Status: DC
  Administered 2019-01-26 – 2019-01-30 (×5): 80 mg via SUBCUTANEOUS
  Filled 2019-01-26 (×5): qty 0.8

## 2019-01-26 MED ORDER — FAMOTIDINE 20 MG PO TABS
20.0000 mg | ORAL_TABLET | Freq: Two times a day (BID) | ORAL | Status: DC
  Administered 2019-01-27 – 2019-02-11 (×28): 20 mg via ORAL
  Filled 2019-01-26 (×31): qty 1

## 2019-01-26 MED ORDER — SODIUM CHLORIDE 0.9 % IV BOLUS
500.0000 mL | Freq: Once | INTRAVENOUS | Status: AC
  Administered 2019-01-26: 12:00:00 500 mL via INTRAVENOUS

## 2019-01-26 MED ORDER — ALBUTEROL SULFATE HFA 108 (90 BASE) MCG/ACT IN AERS
4.0000 | INHALATION_SPRAY | Freq: Once | RESPIRATORY_TRACT | Status: AC
  Administered 2019-01-26: 12:00:00 4 via RESPIRATORY_TRACT
  Filled 2019-01-26: qty 6.7

## 2019-01-26 MED ORDER — IPRATROPIUM-ALBUTEROL 20-100 MCG/ACT IN AERS
2.0000 | INHALATION_SPRAY | Freq: Four times a day (QID) | RESPIRATORY_TRACT | Status: DC
  Administered 2019-01-26 – 2019-01-28 (×9): 2 via RESPIRATORY_TRACT
  Filled 2019-01-26 (×2): qty 4

## 2019-01-26 MED ORDER — ACETAMINOPHEN 325 MG PO TABS
650.0000 mg | ORAL_TABLET | ORAL | Status: DC | PRN
  Administered 2019-01-26 – 2019-01-31 (×11): 650 mg via ORAL
  Filled 2019-01-26 (×12): qty 2

## 2019-01-26 MED ORDER — MAGNESIUM SULFATE 4 GM/100ML IV SOLN
4.0000 g | Freq: Once | INTRAVENOUS | Status: AC
  Administered 2019-01-26: 18:00:00 4 g via INTRAVENOUS
  Filled 2019-01-26: qty 100

## 2019-01-26 MED ORDER — ACETAMINOPHEN 650 MG RE SUPP
650.0000 mg | RECTAL | Status: DC | PRN
  Administered 2019-02-06: 650 mg via RECTAL
  Filled 2019-01-26 (×2): qty 1

## 2019-01-26 MED ORDER — INSULIN ASPART 100 UNIT/ML ~~LOC~~ SOLN
0.0000 [IU] | Freq: Three times a day (TID) | SUBCUTANEOUS | Status: DC
  Administered 2019-01-27: 15 [IU] via SUBCUTANEOUS

## 2019-01-26 MED ORDER — DEXAMETHASONE SODIUM PHOSPHATE 10 MG/ML IJ SOLN
10.0000 mg | Freq: Once | INTRAMUSCULAR | Status: AC
  Administered 2019-01-26: 12:00:00 10 mg via INTRAVENOUS
  Filled 2019-01-26: qty 1

## 2019-01-26 NOTE — ED Notes (Addendum)
Pt SpO2 dropped down to 78%. Pt placed on 2L O2. SpO2 jumped back up to 96%. Pts SpO2 has been fluctuating up and down. Pt reports she feels like her O2 drops every time she has the urge to spit up phlegm.

## 2019-01-26 NOTE — ED Provider Notes (Signed)
[name]   CSN: [id number] Arrival date & time: 01/26/19  1026     History Chief Complaint  Patient presents with  . COVID +  . Cough    Anita Hunter is a 28 y.o. female past medical history of eczema, sleep apnea who presents for evaluation of known COVID-19 infection.  Patient reports that she her girlfriend tested positive out 5 days ago.  She reports that 2 days ago, she started having symptoms.  She describes body aches, fever/chills with fever measuring up to 101.  She also has had cough that is productive of phlegm.  Patient reports that she was diagnosed with bronchitis in early 2020 and states that "she was told she has a touch of asthma."  She was given an albuterol inhaler which she has used since then.  She does not currently have any albuterol.  States she has never been officially diagnosed with asthma.  Patient states that she has not any chest pain, abdominal pain, nausea/vomiting.  She went to urgent care today for evaluation of symptoms where she tested positive for Covid.  While at urgent care, she desatted down to 85% and was sent to the emergency department for further evaluation.  The history is provided by the patient.       Past Medical History:  Diagnosis Date  . Back pain   . Eczema   . Seasonal allergies   . Sleep apnea     Patient Active Problem List   Diagnosis Date Noted  . Pneumonia due to COVID-19 virus 01/26/2019  . Acute non intractable tension-type headache 04/16/2018  . Encounter for annual physical exam 04/16/2018  . Chronic left-sided low back pain without sciatica 03/19/2018  . Seasonal allergic rhinitis 03/19/2018  . RLQ abdominal pain 10/24/2016  . Obstructive sleep apnea 08/27/2016  . Back pain 06/27/2016  . Prediabetes 03/02/2015  . Sinusitis, chronic 03/25/2014  . Tobacco use disorder 06/14/2012  . Knee pain, left 06/13/2012  . Morbid obesity with BMI of 50.0-59.9, adult  ([place]) 04/11/2006  . Eczema 04/11/2006    Past Surgical History:  Procedure Laterality Date  . NO PAST SURGERIES       OB History    Gravida  0   Para  0   Term  0   Preterm  0   AB  0   Living  0     SAB  0   TAB  0   Ectopic  0   Multiple  0   Live Births  0           Family History  Problem Relation Age of Onset  . Hypertension Father   . Migraines Father   . Asthma Brother        No formal diagnosis as of 06/13/2012  . Diabetes Maternal Grandmother   . Hypertension Maternal Grandmother   . Hypertension Paternal Grandmother   . Fibroids Other        Uncertain which family member(s)    Social History   Tobacco Use  . Smoking status: Never Smoker  . Smokeless tobacco: Never Used  Substance Use Topics  . Alcohol use: No  . Drug use: No    Types: Marijuana    Comment: marijuna tea    Home Medications Prior to Admission medications   Medication Sig Start Date End Date Taking? Authorizing Provider  acetaminophen (TYLENOL) 500 MG tablet Take 1,000 mg by mouth every 6 (six) hours  as needed for moderate pain or fever.   Yes [provider]  albuterol (VENTOLIN HFA) 108 (90 Base) MCG/ACT inhaler Inhale 2 puffs into the lungs every 6 (six) hours as needed for wheezing or shortness of breath.   Yes [provider]  fluticasone (FLONASE) 50 MCG/ACT nasal spray Place 2 sprays into both nostrils daily. 03/19/18  Yes Palmetto Bing, DO  Phenyleph-Diphenhyd-DM-APAP ([employer] SEVERE COLD/FLU) Liquid LQPK Take 15 mLs by mouth 2 (two) times daily as needed (cold/flu symptoms).    Yes [provider]  triamcinolone ointment (KENALOG) 0.1 % Apply 1 application topically 2 (two) times daily. Patient taking differently: Apply 1 application topically daily.  03/19/18  Yes Niobrara Bing, DO  famotidine (PEPCID) 20 MG tablet Take 1 tablet (20 mg total) by mouth 2 (two) times daily. Patient not taking: Reported on 04/16/2018 10/21/16   Charlesetta Shanks, MD  ibuprofen (ADVIL) 800 MG tablet Take 1 tablet (800 mg total) by mouth 3 (three) times daily. Patient not taking: Reported on 01/26/2019 11/28/18   Palumbo, April, MD  metFORMIN (GLUCOPHAGE) 500 MG tablet Take 1 tablet (500 mg total) by mouth daily with breakfast. Patient not taking: Reported on 01/26/2019 05/05/18   Drenda Freeze, MD    Allergies    Shellfish allergy  Review of Systems   Review of Systems  Constitutional: Positive for chills and fever.  Respiratory: Positive for cough and shortness of breath.   Cardiovascular: Negative for chest pain.  Gastrointestinal: Negative for abdominal pain, nausea and vomiting.  Genitourinary: Negative for dysuria and hematuria.  Musculoskeletal: Positive for myalgias.  Neurological: Negative for headaches.  All other systems reviewed and are negative.   Physical Exam Updated Vital Signs BP (!) 147/79   Pulse 98   Temp 98.9 F (37.2 C) (Oral)   Resp 20   SpO2 95%   Physical Exam Vitals and nursing note reviewed.  Constitutional:      Appearance: Normal appearance. She is well-developed.  HENT:     Head: Normocephalic and atraumatic.  Eyes:     General: Lids are normal.     Conjunctiva/sclera: Conjunctivae normal.     Pupils: Pupils are equal, round, and reactive to light.  Cardiovascular:     Rate and Rhythm: Normal rate and regular rhythm.     Pulses: Normal pulses.     Heart sounds: Normal heart sounds. No murmur. No friction rub. No gallop.   Pulmonary:     Effort: Pulmonary effort is normal.     Comments: Speaking a medium long sentences.  Rhonchorous breath sounds noted to the bilateral bases.  Some slightly tight but no evidence of wheezing. Abdominal:     Palpations: Abdomen is soft. Abdomen is not rigid.     Tenderness: There is no abdominal tenderness. There is no guarding.  Musculoskeletal:        General: Normal range of motion.     Cervical back: Full passive range of motion without pain.  Skin:     General: Skin is warm and dry.     Capillary Refill: Capillary refill takes less than 2 seconds.  Neurological:     Mental Status: She is alert and oriented to person, place, and time.  Psychiatric:        Speech: Speech normal.     ED Results / Procedures / Treatments   Labs (all labs ordered are listed, but only abnormal results are displayed) Labs Reviewed  BASIC METABOLIC PANEL - Abnormal;  Notable for the following components:      Result Value   Chloride 96 (*)    Glucose, Bld 257 (*)    Calcium 8.3 (*)    All other components within normal limits  CBC WITH DIFFERENTIAL/PLATELET - Abnormal; Notable for the following components:   Lymphs Abs 0.6 (*)    All other components within normal limits  HIV ANTIBODY (ROUTINE TESTING W REFLEX)  FIBRINOGEN  FERRITIN  D-DIMER, QUANTITATIVE (NOT AT [facility])  C-REACTIVE PROTEIN  MAGNESIUM  PHOSPHORUS  BRAIN NATRIURETIC PEPTIDE  PROCALCITONIN  I-STAT BETA HCG BLOOD, ED ([facility], [facility], [facility] ONLY)  ABO/RH    EKG None  Radiology DG Chest Portable 1 View  Result Date: 01/26/2019 CLINICAL DATA:  Cough.  Coronavirus infection. EXAM: PORTABLE CHEST 1 VIEW COMPARISON:  None. FINDINGS: The heart size and mediastinal contours are within normal limits. Both lungs are clear. The visualized skeletal structures are unremarkable. IMPRESSION: No active disease. Electronically Signed   By: Nelson Chimes M.D.   On: 01/26/2019 11:29    Procedures Procedures (including critical care time)  Medications Ordered in ED Medications  Ipratropium-Albuterol (COMBIVENT) respimat 2 puff (has no administration in time range)  dexamethasone (DECADRON) injection 10 mg (10 mg Intravenous Given 01/26/19 1144)  albuterol (VENTOLIN HFA) 108 (90 Base) MCG/ACT inhaler 4 puff (4 puffs Inhalation Given 01/26/19 1134)  sodium chloride 0.9 % bolus 500 mL (0 mLs Intravenous Stopped 01/26/19 1349)    ED Course  I have reviewed the triage vital signs and the nursing notes.   Pertinent labs & imaging results that were available during my care of the patient were reviewed by me and considered in my medical decision making (see chart for details).    MDM Rules/Calculators/A&P                      28 year old female who presents for evaluation of known COVID-19 infection.  Was seen at urgent care where she desatted down to 85% and was sent to the emergency department for further evaluation.  Initially arrival, she was satting at 89%.  She is slightly tachycardic.  Vitals otherwise stable.  Patient fluctuated on her O2 sats between 90-94 on room air.  We will plan to check chest x-ray, give albuterol, Decadron and reevaluate.  Chest x-ray shows no evidence of acute infectious etiology.  Patient ambulated with the sats going down to 88% on room air.  RN did notice that patient's O2 sats were fluctuating between mid 80s-low 90s.  She did have an episode where she desatted down to 78% on room air.  She was placed on 2 L where she has been stable at 93% since then.  Given hypoxia and oxygen requirement, will plan for admission.  Discussed patient with Dr. Olevia Bowens (hospitalist) who accepts patient for admission.   JASMINE MCBETH was evaluated in Emergency Department on 01/26/2019 for the symptoms described in the history of present illness. She was evaluated in the context of the global COVID-19 pandemic, which necessitated consideration that the patient might be at risk for infection with the SARS-CoV-2 virus that causes COVID-19. Institutional protocols and algorithms that pertain to the evaluation of patients at risk for COVID-19 are in a state of rapid change based on information released by regulatory bodies including the CDC and federal and state organizations. These policies and algorithms were followed during the patient's care in the ED.   Portions of this note were generated with [employer]. Dictation  errors may occur despite best attempts at  proofreading.    Final Clinical Impression(s) / ED Diagnoses Final diagnoses:  [id number]  Hypoxia    Rx / DC Orders ED Discharge Orders    None       Volanda Napoleon, PA-C 01/26/19 1523    Milton Ferguson, MD 01/27/19 1030

## 2019-01-26 NOTE — ED Triage Notes (Signed)
Patient here via EMS with complaints of COVID +. Reports being at the Urgent Care today for "nagging" cough x2 days. 89-90% RA.

## 2019-01-26 NOTE — ED Notes (Signed)
ED TO INPATIENT HANDOFF REPORT  Name/Age/Gender Anita Hunter 28 y.o. female  Code Status   Home/SNF/Other Home  Chief Complaint Pneumonia due to COVID-19 virus [U07.1, J12.89]  Level of Care/Admitting Diagnosis ED Disposition    ED Disposition Condition [facility] Area: [place] [100102]  Level of Care: Telemetry [5]  Admit to tele based on following criteria: Monitor for Ischemic changes  Covid Evaluation: Confirmed COVID Positive  Diagnosis: Pneumonia due to COVID-19 virus [[id number]]  Admitting Physician: Reubin Milan [[id number]]  Attending Physician: Reubin Milan [[id number]]  Estimated length of stay: past midnight tomorrow  Certification:: I certify this patient will need inpatient services for at least 2 midnights       Medical History Past Medical History:  Diagnosis Date  . Back pain   . Eczema   . Seasonal allergies   . Sleep apnea     Allergies Allergies  Allergen Reactions  . Shellfish Allergy Nausea Only    IV Location/Drains/Wounds Patient Lines/Drains/Airways Status   Active Line/Drains/Airways    Name:   Placement date:   Placement time:   Site:   Days:   Peripheral IV 01/26/19 Right Antecubital   01/26/19    1143    Antecubital   less than 1          Labs/Imaging Results for orders placed or performed during the hospital encounter of 01/26/19 (from the past 48 hour(s))  Basic metabolic panel     Status: Abnormal   Collection Time: 01/26/19  1:29 PM  Result Value Ref Range   Sodium 135 135 - 145 mmol/L   Potassium 4.3 3.5 - 5.1 mmol/L   Chloride 96 (L) 98 - 111 mmol/L   CO2 28 22 - 32 mmol/L   Glucose, Bld 257 (H) 70 - 99 mg/dL   BUN 7 6 - 20 mg/dL   Creatinine, Ser 0.72 0.44 - 1.00 mg/dL   Calcium 8.3 (L) 8.9 - 10.3 mg/dL   GFR calc non Af Amer >60 >60 mL/min   GFR calc Af Amer >60 >60 mL/min   Anion gap 11 5 - 15    Comment: Performed at [facility], [place] [address] [place], [place] [address]  CBC with Differential     Status: Abnormal   Collection Time: 01/26/19  1:29 PM  Result Value Ref Range   WBC 6.3 4.0 - 10.5 K/uL   RBC 4.86 3.87 - 5.11 MIL/uL   Hemoglobin 13.4 12.0 - 15.0 g/dL   HCT 43.5 36.0 - 46.0 %   MCV 89.5 80.0 - 100.0 fL   MCH 27.6 26.0 - 34.0 pg   MCHC 30.8 30.0 - 36.0 g/dL   RDW 12.7 11.5 - 15.5 %   Platelets 196 150 - 400 K/uL   nRBC 0.0 0.0 - 0.2 %   Neutrophils Relative % 84 %   Neutro Abs 5.3 1.7 - 7.7 K/uL   Lymphocytes Relative 9 %   Lymphs Abs 0.6 (L) 0.7 - 4.0 K/uL   Monocytes Relative 6 %   Monocytes Absolute 0.4 0.1 - 1.0 K/uL   Eosinophils Relative 0 %   Eosinophils Absolute 0.0 0.0 - 0.5 K/uL   Basophils Relative 0 %   Basophils Absolute 0.0 0.0 - 0.1 K/uL   Immature Granulocytes 1 %   Abs Immature Granulocytes 0.05 0.00 - 0.07 K/uL    Comment: Performed at [facility], [address] [place], [place]  [address]  I-Stat beta hCG blood, ED     Status: None   Collection Time: 01/26/19  1:54 PM  Result Value Ref Range   I-stat hCG, quantitative <5.0 <5 mIU/mL   Comment 3            Comment:   GEST. AGE      CONC.  (mIU/mL)   <=1 WEEK        5 - 50     2 WEEKS       50 - 500     3 WEEKS       100 - 10,000     4 WEEKS     1,000 - 30,000        FEMALE AND NON-PREGNANT FEMALE:     LESS THAN 5 mIU/mL   ABO/Rh     Status: None   Collection Time: 01/26/19  3:21 PM  Result Value Ref Range   ABO/RH(D)      O POS Performed at [facility], [place] [address] [place], [place] [address]   Fibrinogen     Status: Abnormal   Collection Time: 01/26/19  3:21 PM  Result Value Ref Range   Fibrinogen 563 (H) 210 - 475 mg/dL    Comment: Performed at [facility], [place] [address], [place], [place] [address]  D-dimer, quantitative (not at [facility])     Status: Abnormal   Collection Time: 01/26/19  3:21 PM  Result Value Ref Range   D-Dimer, Quant 0.53 (H) 0.00 -  0.50 ug/mL-FEU    Comment: (NOTE) At the manufacturer cut-off of 0.50 ug/mL FEU, this assay has been documented to exclude PE with a sensitivity and negative predictive value of 97 to 99%.  At this time, this assay has not been approved by the FDA to exclude DVT/VTE. Results should be correlated with clinical presentation. Performed at [facility], [place] [address], [place], [place] [address]   Magnesium     Status: Abnormal   Collection Time: 01/26/19  3:21 PM  Result Value Ref Range   Magnesium 1.5 (L) 1.7 - 2.4 mg/dL    Comment: Performed at [facility], [place] [address] [place] [place], [place] [address]  Phosphorus     Status: None   Collection Time: 01/26/19  3:21 PM  Result Value Ref Range   Phosphorus 2.8 2.5 - 4.6 mg/dL    Comment: Performed at [facility], [place] [address] [place], [place] [address]  Brain natriuretic peptide     Status: None   Collection Time: 01/26/19  3:21 PM  Result Value Ref Range   B Natriuretic Peptide 10.5 0.0 - 100.0 pg/mL    Comment: Performed at [facility], [place] [address], [place], [place] [address]   DG Chest Portable 1 View  Result Date: 01/26/2019 CLINICAL DATA:  Cough.  Coronavirus infection. EXAM: PORTABLE CHEST 1 VIEW COMPARISON:  None. FINDINGS: The heart size and mediastinal contours are within normal limits. Both lungs are clear. The visualized skeletal structures are unremarkable. IMPRESSION: No active disease. Electronically Signed   By: Nelson Chimes M.D.   On: 01/26/2019 11:29    Pending Labs Unresulted Labs (From admission, onward)    Start     Ordered   01/27/19 0500  Comprehensive metabolic panel  Daily,   R     01/26/19 1442   01/27/19 0500  CBC with Differential/Platelet  Daily,   R     01/26/19  1442   01/27/19 0500  C-reactive protein  Daily,   R     01/26/19 1442   01/27/19 0500  D-dimer, quantitative (not at [facility])  Daily,   R     01/26/19 1442    01/27/19 0500  Ferritin  Daily,   R     01/26/19 1442   01/27/19 0500  Magnesium  Daily,   R     01/26/19 1442   01/27/19 0500  Phosphorus  Daily,   R     01/26/19 1442   01/26/19 1456  Procalcitonin  Add-on,   AD     01/26/19 1455   01/26/19 1439  C-reactive protein  Once,   STAT     01/26/19 1442   01/26/19 1438  Ferritin  Once,   STAT     01/26/19 1442   01/26/19 1437  HIV Antibody (routine testing w rflx)  (HIV Antibody (Routine testing w reflex) panel)  Once,   STAT     01/26/19 1442          Vitals/Pain Today's Vitals   01/26/19 1209 01/26/19 1210 01/26/19 1212 01/26/19 1444  BP:    (!) 147/79  Pulse: 99 97 96 98  Resp:    20  Temp:      TempSrc:      SpO2: 96% 94% 93% 95%  PainSc:        Isolation Precautions Airborne and Contact precautions  Medications Medications  Ipratropium-Albuterol (COMBIVENT) respimat 2 puff (has no administration in time range)  dexamethasone (DECADRON) injection 10 mg (10 mg Intravenous Given 01/26/19 1144)  albuterol (VENTOLIN HFA) 108 (90 Base) MCG/ACT inhaler 4 puff (4 puffs Inhalation Given 01/26/19 1134)  sodium chloride 0.9 % bolus 500 mL (0 mLs Intravenous Stopped 01/26/19 1349)    Mobility walks

## 2019-01-26 NOTE — H&P (Signed)
History and Physical    Anita Hunter:[id number] DOB: 01-11-1991 DOA: 01/26/2019  PCP: Anita Dawn, MD   Patient coming from: Home.  I have personally briefly reviewed patient's old medical records in [place]  Chief Complaint: Shortness of breath.  HPI: Anita Hunter is a 28 y.o. female with medical history significant of chronic back pain, morbid obesity, eczema, seasonal allergies, sleep apnea, type 2 diabetes who is coming to the emergency department due to progressively worse shortness of breath for the past 2 days associated with sinus congestion, fatigue, malaise, low-grade temperatures and chills.  She had a history of exposure to a positive COVID-19 sick contact 2 days before the onset of symptoms.  She denies headache, blurred vision, rhinorrhea, sore throat, wheezing or hemoptysis.  No chest pain, palpitations, dizziness, diaphoresis, PND orthopnea or pitting edema of the lower extremities.  She denies abdominal pain, nausea, emesis, diarrhea, constipation, melena or hematochezia.  No dysuria, frequency or hematuria.  She denies polyuria, polydipsia, polyphagia or blurred vision.  ED Course: Initial vital signs were temperature 98.9 F, pulse 110, respirations 20, blood pressure 170/90 mmHg and O2 sat 90% on room air.  The patient O2 sat increases to 95% on 2 L of nasal cannula oxygen.  Her CBC was normal.  BMP shows a chloride 96 mmol/L, glucose of 257 and calcium 8.3 mg/dL, all other values are normal.  Magnesium was 1.5 and phosphorus 2.8 mg/dL .  D-dimer 0.53, fibrinogen 563, ferritin 562 and CRP 4.8.  Her chest radiograph did not have any acute infiltrates.  Review of Systems: As per HPI otherwise 10 point review of systems negative.   Past Medical History:  Diagnosis Date  . Back pain   . Eczema   . Seasonal allergies   . Sleep apnea   . Type 2 diabetes mellitus ([place]) 01/26/2019    Past Surgical History:  Procedure Laterality Date  . NO PAST  SURGERIES       reports that she has never smoked. She has never used smokeless tobacco. She reports that she does not drink alcohol or use drugs.  Allergies  Allergen Reactions  . Shellfish Allergy Nausea Only    Family History  Problem Relation Age of Onset  . Hypertension Father   . Migraines Father   . Asthma Brother        No formal diagnosis as of 06/13/2012  . Diabetes Maternal Grandmother   . Hypertension Maternal Grandmother   . Hypertension Paternal Grandmother   . Fibroids Other        Uncertain which family member(s)   Prior to Admission medications   Medication Sig Start Date End Date Taking? Authorizing Provider  acetaminophen (TYLENOL) 500 MG tablet Take 1,000 mg by mouth every 6 (six) hours as needed for moderate pain or fever.   Yes [provider]  albuterol (VENTOLIN HFA) 108 (90 Base) MCG/ACT inhaler Inhale 2 puffs into the lungs every 6 (six) hours as needed for wheezing or shortness of breath.   Yes [provider]  fluticasone (FLONASE) 50 MCG/ACT nasal spray Place 2 sprays into both nostrils daily. 03/19/18  Yes Parker Bing, DO  Phenyleph-Diphenhyd-DM-APAP ([employer] SEVERE COLD/FLU) Liquid LQPK Take 15 mLs by mouth 2 (two) times daily as needed (cold/flu symptoms).    Yes [provider]  triamcinolone ointment (KENALOG) 0.1 % Apply 1 application topically 2 (two) times daily. Patient taking differently: Apply 1 application topically daily.  03/19/18  Yes Anita Hunter  J, DO  famotidine (PEPCID) 20 MG tablet Take 1 tablet (20 mg total) by mouth 2 (two) times daily. Patient not taking: Reported on 04/16/2018 10/21/16   Anita Shanks, MD  ibuprofen (ADVIL) 800 MG tablet Take 1 tablet (800 mg total) by mouth 3 (three) times daily. Patient not taking: Reported on 01/26/2019 11/28/18   Palumbo, April, MD  metFORMIN (GLUCOPHAGE) 500 MG tablet Take 1 tablet (500 mg total) by mouth daily with breakfast. Patient not taking: Reported on  01/26/2019 05/05/18   Anita Freeze, MD    Physical Exam: Vitals:   01/26/19 1210 01/26/19 1212 01/26/19 1444 01/26/19 1651  BP:   (!) 147/79 (!) 146/81  Pulse: 97 96 98 96  Resp:   20 20  Temp:    99.1 F (37.3 C)  TempSrc:    Oral  SpO2: 94% 93% 95% 96%    Constitutional: NAD, calm, comfortable Eyes: PERRL, lids and conjunctivae normal ENMT: Mucous membranes are moist. Posterior pharynx clear of any exudate or lesions. Neck: normal, supple, no masses, no thyromegaly Respiratory: Decreased breath sounds in bases, otherwise clear to auscultation bilaterally, no wheezing, no crackles. Normal respiratory effort. No accessory muscle use.  Cardiovascular: Regular rate and rhythm, no murmurs / rubs / gallops. No extremity edema. 2+ pedal pulses. No carotid bruits.  Abdomen: Obese, nondistended.  Soft, no tenderness, no masses palpated. No hepatosplenomegaly. Bowel sounds positive.  Musculoskeletal: no clubbing / cyanosis. Good ROM, no contractures. Normal muscle tone.  Skin: no rashes, lesions, ulcers. No induration Neurologic: CN 2-12 grossly intact. Sensation intact, DTR normal. Strength 5/5 in all 4.  Psychiatric: Normal judgment and insight. Alert and oriented x 3. Normal mood.   Labs on Admission: I have personally reviewed following labs and imaging studies  CBC: Recent Labs  Lab 01/26/19 1329  WBC 6.3  NEUTROABS 5.3  HGB 13.4  HCT 43.5  MCV 89.5  PLT 161   Basic Metabolic Panel: Recent Labs  Lab 01/26/19 1329 01/26/19 1521  NA 135  --   K 4.3  --   CL 96*  --   CO2 28  --   GLUCOSE 257*  --   BUN 7  --   CREATININE 0.72  --   CALCIUM 8.3*  --   MG  --  1.5*  PHOS  --  2.8   GFR: CrCl cannot be calculated (Unknown ideal weight.). Liver Function Tests: No results for input(s): AST, ALT, ALKPHOS, BILITOT, PROT, ALBUMIN in the last 168 hours. No results for input(s): LIPASE, AMYLASE in the last 168 hours. No results for input(s): AMMONIA in the last 168  hours. Coagulation Profile: No results for input(s): INR, PROTIME in the last 168 hours. Cardiac Enzymes: No results for input(s): CKTOTAL, CKMB, CKMBINDEX, TROPONINI in the last 168 hours. BNP (last 3 results) No results for input(s): PROBNP in the last 8760 hours. HbA1C: No results for input(s): HGBA1C in the last 72 hours. CBG: No results for input(s): GLUCAP in the last 168 hours. Lipid Profile: No results for input(s): CHOL, HDL, LDLCALC, TRIG, CHOLHDL, LDLDIRECT in the last 72 hours. Thyroid Function Tests: No results for input(s): TSH, T4TOTAL, FREET4, T3FREE, THYROIDAB in the last 72 hours. Anemia Panel: Recent Labs    01/26/19 1521  FERRITIN 562*   Urine analysis:    Component Value Date/Time   COLORURINE YELLOW 11/27/2018 2358   APPEARANCEUR CLOUDY (A) 11/27/2018 2358   LABSPEC 1.025 11/27/2018 2358   PHURINE 6.0 11/27/2018 2358  GLUCOSEU NEGATIVE 11/27/2018 [place] 11/27/2018 [place] 11/27/2018 2358   KETONESUR 15 (A) 11/27/2018 2358   PROTEINUR NEGATIVE 11/27/2018 2358   NITRITE NEGATIVE 11/27/2018 2358   LEUKOCYTESUR NEGATIVE 11/27/2018 2358    Radiological Exams on Admission: DG Chest Portable 1 View  Result Date: 01/26/2019 CLINICAL DATA:  Cough.  Coronavirus infection. EXAM: PORTABLE CHEST 1 VIEW COMPARISON:  None. FINDINGS: The heart size and mediastinal contours are within normal limits. Both lungs are clear. The visualized skeletal structures are unremarkable. IMPRESSION: No active disease. Electronically Signed   By: Nelson Chimes M.D.   On: 01/26/2019 11:29    EKG: Independently reviewed. Vent. rate 103 BPM PR interval * ms QRS duration 98 ms QT/QTc 346/453 ms P-R-T axes 52 -39 20 Sinus tachycardia Left axis deviation Low voltage, precordial leads  Assessment/Plan Principal problem:   Pneumonia due to COVID-19 virus Admit to telemetry/inpatient. Continue supplemental oxygen. Continue  bronchodilators. Continue glucocorticoids. Analgesics as needed. Antitussives and antiemetics as needed. Does not meet criteria for Actemra. Does not meet criteria for remdesivir. Repeat imaging if patient's oxygenation worsens.  Active Problems:   Type 2 diabetes mellitus (HCC) Carbohydrate modified diet. Check hemoglobin A1c. CBG monitoring with RI SS. Advised to follow closely as an outpatient.    Hypomagnesemia Replenishment. Follow-up magnesium level.    Morbid obesity with BMI of 50.0-59.9, adult ([place]) Significant risk factor for Covid morbidity/mortality.    Obstructive sleep apnea Avoid CPAP/BiPAP this time.   DVT prophylaxis: Lovenox SQ. Code Status: Full code. Family Communication: Disposition Plan: Admit for COVID-19 pneumonia treatment. Consults called: Admission status: Inpatient/telemetry.   Reubin Milan MD [facility]  If 7PM-7AM, please contact night-coverage [email or web address]  01/26/2019, 5:27 PM   This document was prepared using Dragon voice recognition software and may contain some unintended transcription errors.

## 2019-01-26 NOTE — ED Notes (Addendum)
Pt ambulated around room and SpO2 dropped to 88% but came back up to 93% quickly.

## 2019-01-26 NOTE — Discharge Instructions (Addendum)
28 year old female with history of severe morbid obesity, hypertension, asthma presenting for shortness of breath, Covid exposure.  Rapid Covid done in office: Positive.  Hypoxic throughout visit: 85-92% with heart rate 107-123 bpm.  Given patient's comorbidities, hypoxic state with Covid positive test, patient referred to ER for further evaluation/observation/management.  Patient transported to ER via EMS in stable condition.

## 2019-01-26 NOTE — ED Provider Notes (Signed)
[name]    CSN: [id number] Arrival date & time: 01/26/19  0845      History   Chief Complaint Chief Complaint  Patient presents with  . Shortness of Breath    HPI Anita Hunter is a 28 y.o. female with history of morbid obesity, allergies, asthma, sleep apnea   Presenting for Covid testing: Exposure: Roommate  Date of exposure: Repeated: Last known on Thursday Any fever, symptoms since exposure: Yes: Fever (T-max 101F), generalized headache (none at this time), cough, dyspnea.  Has been using albuterol inhaler without significant relief.  Patient also endorses back pain, though states this is chronic/stable.   Past Medical History:  Diagnosis Date  . Back pain   . Eczema   . Seasonal allergies   . Sleep apnea     Patient Active Problem List   Diagnosis Date Noted  . Acute non intractable tension-type headache 04/16/2018  . Encounter for annual physical exam 04/16/2018  . Chronic left-sided low back pain without sciatica 03/19/2018  . Seasonal allergic rhinitis 03/19/2018  . RLQ abdominal pain 10/24/2016  . Obstructive sleep apnea 08/27/2016  . Back pain 06/27/2016  . Prediabetes 03/02/2015  . Sinusitis, chronic 03/25/2014  . Tobacco use disorder 06/14/2012  . Knee pain, left 06/13/2012  . Morbid obesity with BMI of 50.0-59.9, adult ([place]) 04/11/2006  . Eczema 04/11/2006    Past Surgical History:  Procedure Laterality Date  . NO PAST SURGERIES      OB History    Gravida  0   Para  0   Term  0   Preterm  0   AB  0   Living  0     SAB  0   TAB  0   Ectopic  0   Multiple  0   Live Births  0            Home Medications    Prior to Admission medications   Medication Sig Start Date End Date Taking? Authorizing Provider  famotidine (PEPCID) 20 MG tablet Take 1 tablet (20 mg total) by mouth 2 (two) times daily. Patient not taking: Reported on 04/16/2018 10/21/16   Charlesetta Shanks, MD  fluticasone [facility]) 50 MCG/ACT  nasal spray Place 2 sprays into both nostrils daily. 03/19/18   [place] Bing, DO  ibuprofen (ADVIL) 800 MG tablet Take 1 tablet (800 mg total) by mouth 3 (three) times daily. 11/28/18   Palumbo, April, MD  metFORMIN (GLUCOPHAGE) 500 MG tablet Take 1 tablet (500 mg total) by mouth daily with breakfast. 05/05/18   Drenda Freeze, MD  triamcinolone ointment (KENALOG) 0.1 % Apply 1 application topically 2 (two) times daily. 03/19/18   [place] Bing, DO    Family History Family History  Problem Relation Age of Onset  . Hypertension Father   . Migraines Father   . Asthma Brother        No formal diagnosis as of 06/13/2012  . Diabetes Maternal Grandmother   . Hypertension Maternal Grandmother   . Hypertension Paternal Grandmother   . Fibroids Other        Uncertain which family member(s)    Social History Social History   Tobacco Use  . Smoking status: Never Smoker  . Smokeless tobacco: Never Used  Substance Use Topics  . Alcohol use: No  . Drug use: No    Types: Marijuana    Comment: marijuna tea     Allergies   Shellfish allergy  Review of Systems Review of Systems  Constitutional: Positive for fever. Negative for fatigue.  HENT: Negative for ear pain, sinus pain, sore throat and voice change.   Eyes: Negative for pain, redness and visual disturbance.  Respiratory: Positive for cough, chest tightness and shortness of breath. Negative for wheezing and stridor.   Cardiovascular: Negative for chest pain, palpitations and leg swelling.  Gastrointestinal: Negative for abdominal pain, diarrhea and vomiting.  Musculoskeletal: Negative for arthralgias and myalgias.  Skin: Negative for rash and wound.  Neurological: Negative for syncope and headaches.     Physical Exam Triage Vital Signs ED Triage Vitals [01/26/19 0858]  Enc Vitals Group     BP (!) 174/98     Pulse Rate (!) 112     Resp 20     Temp 98.9 F (37.2 C)     Temp Source Temporal     SpO2 (!) 89 %      Weight      Height      Head Circumference      Peak Flow      Pain Score 0     Pain Loc      Pain Edu?      Excl. in [place]?    No data found.  Updated Vital Signs BP (!) 174/98 (BP Location: Left Arm)   Pulse (!) 112   Temp 98.9 F (37.2 C) (Temporal)   Resp 20   SpO2 (!) 89%   Visual Acuity Right Eye Distance:   Left Eye Distance:   Bilateral Distance:    Right Eye Near:   Left Eye Near:    Bilateral Near:     Physical Exam Constitutional:      General: She is not in acute distress.    Appearance: She is well-developed. She is obese.     Comments: mildly diaphoretic  HENT:     Head: Normocephalic and atraumatic.     Mouth/Throat:     Mouth: Mucous membranes are moist.     Pharynx: Oropharynx is clear.  Eyes:     General: No scleral icterus.    Pupils: Pupils are equal, round, and reactive to light.  Neck:     Vascular: No JVD.     Trachea: No tracheal deviation.  Cardiovascular:     Rate and Rhythm: Regular rhythm. Tachycardia present.     Heart sounds: No murmur. No gallop.   Pulmonary:     Effort: Pulmonary effort is normal. No accessory muscle usage or respiratory distress.     Breath sounds: No stridor. Examination of the right-lower field reveals rales. Decreased breath sounds and rales present. No wheezing or rhonchi.  Chest:     Chest wall: No crepitus or edema.  Musculoskeletal:     Right lower leg: No tenderness. No edema.     Left lower leg: No tenderness. No edema.  Skin:    Capillary Refill: Capillary refill takes more than 3 seconds.     Coloration: Skin is not cyanotic, jaundiced or pale.     Nails: There is no clubbing.  Neurological:     General: No focal deficit present.     Mental Status: She is alert and oriented to person, place, and time.  Psychiatric:        Behavior: Behavior normal.      UC Treatments / Results  Labs (all labs ordered are listed, but only abnormal results are displayed) Labs Reviewed  POC SARS CORONAVIRUS  2 AG -  ED - Abnormal; Notable for the following components:      Result Value   SARS Coronavirus 2 Ag Positive (*)    All other components within normal limits    EKG   Radiology No results found.  Procedures Procedures (including critical care time)  Medications Ordered in UC Medications - No data to display  Initial Impression / Assessment and Plan / UC Course  I have reviewed the triage vital signs and the nursing notes.  Pertinent labs & imaging results that were available during my care of the patient were reviewed by me and considered in my medical decision making (see chart for details).     Please see discharge instructions for MDM. Final Clinical Impressions(s) / UC Diagnoses   Final diagnoses:  COVID-19 virus infection     Discharge Instructions     28 year old female with history of severe morbid obesity, hypertension, asthma presenting for shortness of breath, Covid exposure.  Rapid Covid done in office: Positive.  Hypoxic throughout visit: 85-92% with heart rate 107-123 bpm.  Given patient's comorbidities, hypoxic state with Covid positive test, patient referred to ER for further evaluation/observation/management.  Patient transported to ER via EMS in stable condition.    ED Prescriptions    None     PDMP not reviewed this encounter.   Neldon Mc [place], [place] 01/26/19 [phone]

## 2019-01-26 NOTE — ED Triage Notes (Addendum)
Pt presents to [facility] for assessment after an exposure on Thursday.  Pt states that evening she began to have cough, fevers (101), headache (none at this time).  Patient not able to speak in full sentences due to cough reflex, RR 18-24.

## 2019-01-26 NOTE — ED Notes (Signed)
Patient able to ambulate independently  

## 2019-01-26 NOTE — ED Notes (Signed)
Called for pickup of patient by [facility] non emergency traffic, per APP

## 2019-01-26 NOTE — Plan of Care (Signed)
Patient will call for assistance when needing to get out of bed. Call bell and belongings within reach.

## 2019-01-27 ENCOUNTER — Telehealth: Payer: Self-pay

## 2019-01-27 DIAGNOSIS — K7581 Nonalcoholic steatohepatitis (NASH): Secondary | ICD-10-CM | POA: Diagnosis present

## 2019-01-27 LAB — GLUCOSE, CAPILLARY
Glucose-Capillary: 333 mg/dL — ABNORMAL HIGH (ref 70–99)
Glucose-Capillary: 325 mg/dL — ABNORMAL HIGH (ref 70–99)
Glucose-Capillary: 388 mg/dL — ABNORMAL HIGH (ref 70–99)
Glucose-Capillary: 335 mg/dL — ABNORMAL HIGH (ref 70–99)

## 2019-01-27 MED ORDER — INSULIN ASPART 100 UNIT/ML ~~LOC~~ SOLN: 0.0000 [IU] | Freq: Three times a day (TID) | SUBCUTANEOUS | Status: DC

## 2019-01-27 MED ORDER — INSULIN DETEMIR 100 UNIT/ML ~~LOC~~ SOLN
0.1000 [IU]/kg | Freq: Two times a day (BID) | SUBCUTANEOUS | Status: DC
  Administered 2019-01-27 – 2019-01-28 (×4): 16 [IU] via SUBCUTANEOUS
  Filled 2019-01-27 (×5): qty 0.16

## 2019-01-27 MED ORDER — LIVING WELL WITH DIABETES BOOK
Freq: Once | Status: AC
  Filled 2019-01-27: qty 1

## 2019-01-27 MED ORDER — INSULIN ASPART 100 UNIT/ML ~~LOC~~ SOLN
0.0000 [IU] | SUBCUTANEOUS | Status: DC
  Administered 2019-01-27 (×2): 15 [IU] via SUBCUTANEOUS
  Administered 2019-01-27: 20 [IU] via SUBCUTANEOUS
  Administered 2019-01-28 (×3): 11 [IU] via SUBCUTANEOUS
  Administered 2019-01-28: 15 [IU] via SUBCUTANEOUS

## 2019-01-27 MED ORDER — HYDROCODONE-HOMATROPINE 5-1.5 MG/5ML PO SYRP
5.0000 mL | ORAL_SOLUTION | Freq: Once | ORAL | Status: AC
  Administered 2019-01-27: 5 mL via ORAL
  Filled 2019-01-27: qty 5

## 2019-01-27 MED ORDER — INSULIN STARTER KIT- PEN NEEDLES (ENGLISH)
1.0000 | Freq: Once | Status: DC
  Filled 2019-01-27: qty 1

## 2019-01-27 MED ORDER — SODIUM CHLORIDE 0.45 % IV BOLUS
1000.0000 mL | Freq: Once | INTRAVENOUS | Status: AC
  Administered 2019-01-27: 1000 mL via INTRAVENOUS

## 2019-01-27 MED ORDER — URSODIOL 300 MG PO CAPS
300.0000 mg | ORAL_CAPSULE | Freq: Three times a day (TID) | ORAL | Status: DC
  Administered 2019-01-27 – 2019-02-11 (×43): 300 mg via ORAL
  Filled 2019-01-27 (×52): qty 1

## 2019-01-27 MED ORDER — SODIUM CHLORIDE 0.45 % IV SOLN: INTRAVENOUS | Status: AC

## 2019-01-27 NOTE — Telephone Encounter (Signed)
[facility] Inpatient Diabetic Coordinator at [facility] calling to inform Dr. Parks Ranger Pt has an A1C of 11.1 and will most likely be sent home with insulin. Pt should be calling our office to schedule and appt once she is discharged from the hospital. Pt has tested pos for COVID and is currently admitted for this. Larene Beach can be reached at [phone] if you have any questions. Ottis Stain, CMA

## 2019-01-27 NOTE — Progress Notes (Signed)
Patients mother contacted to provide update. Permission to speak provided by patient. Unable to reach,  Voicemail message left.

## 2019-01-27 NOTE — Progress Notes (Signed)
Attempted to wean patient to room air. Pt oxygen sustained 92-94 % on ra for 3 hours. Rounding completed 1600, O2 sat now 86 % on ra. Oxygen reapplied at 2 lt, sat improved 95%. CBG 385. Pt complained of worsening cough. Will contact provider, Provider updated.

## 2019-01-27 NOTE — Progress Notes (Signed)
Pt's oxygen saturation dropping to low-mid 80s while pt is asleep. Oxygen titrated up to 4L to maintain oxygen saturation greater than 92% per order. Pt states she has been told she needs to have a sleep study done outpatient for sleep apnea. While pt is awake oxygen saturation maintains imid 90s. Pt able to tolerate proning position through the night. Continuous pulse ox in place. Will continue to monitor.

## 2019-01-27 NOTE — Progress Notes (Signed)
Inpatient Diabetes Program Recommendations  AACE/ADA: New Consensus Statement on Inpatient Glycemic Control (2015)  Target Ranges:  Prepandial:   less than 140 mg/dL      Peak postprandial:   less than 180 mg/dL (1-2 hours)      Critically ill patients:  140 - 180 mg/dL   Lab Results  Component Value Date   GLUCAP 333 (H) 01/27/2019   HGBA1C 11.1 (H) 01/26/2019    Review of Glycemic Control Results for ELLAYNA, HILLIGOSS (MRN [id number]) as of 01/27/2019 09:56  Ref. Range 01/26/2019 17:46 01/26/2019 21:40 01/27/2019 08:32  Glucose-Capillary Latest Ref Range: 70 - 99 mg/dL 317 (H) 369 (H) 333 (H)   Diabetes history: DM 2 Outpatient Diabetes medications: Metformin 500 mg Daily (reported not taking) Current orders for Inpatient glycemic control:  Novolog 0-20 units tid   A1c 11.1% this admission Solumedrol 80 mg Q12 hours  Inpatient Diabetes Program Recommendations:    Consider utilizing COVID Glycemic Control order set Initial SQ therapy  - Moderate basal dosing (which calculates Levemir 16 units bid) - Add Novolog hs coverage.  Will call pt today.  Thanks,  Tama Headings RN, MSN, BC-ADM Inpatient Diabetes Coordinator Team Pager [phone] (8a-5p)

## 2019-01-27 NOTE — Progress Notes (Signed)
Inpatient Diabetes Program Recommendations  AACE/ADA: New Consensus Statement on Inpatient Glycemic Control (2015)  Target Ranges:  Prepandial:   less than 140 mg/dL      Peak postprandial:   less than 180 mg/dL (1-2 hours)      Critically ill patients:  140 - 180 mg/dL   Lab Results  Component Value Date   GLUCAP 333 (H) 01/27/2019   HGBA1C 11.1 (H) 01/26/2019    Review of Glycemic Control  Diabetes history: New Diagnosis this admission Outpatient Diabetes medications: None Prescribed metformin 500 mg Daily by ED on 05/05/18 for elevated glucose Current orders for Inpatient glycemic control:  Levemir 16 units bid Novolog 0-20 units Q4 hours  Pt sees MD at [facility]. Last visit 04/16/2018. Pt had had ED visit on 2/23 in which she had elevated glucose and was placed on metformin 500 mg Daily outpatient, which pt only took for 1 month. Pt has never been diagnosed with DM before.  Spoke with patient about new diabetes diagnosis.  Discussed A1C results (11.1%) and explained what an A1C is. Discussed basic pathophysiology of DM Type 2, basic home care, importance of checking CBGs and maintaining good CBG control to prevent long-term and short-term complications. Reviewed glucose and A1C goals.  Reviewed signs and symptoms of hyperglycemia and hypoglycemia along with treatment for both. Discussed impact of nutrition, exercise, stress, sickness, and medications on diabetes control. Reviewed Living Well with diabetes booklet and encouraged patient to read through entire book. Informed patient that she maybe prescribed basal insulin at time of d/c. Discussed how to take insulin and to rotate injection site.  Will provided patient with handout information on Reli-On products.   Asked patient to check her glucose at least 2 times a day (fasting and alternating second check).  Sent over email video links on how to operate insulin pen, how to check CBGs,  and reviewing hypoglycemia.  Patient verbalized understanding of information discussed and has no further questions at this time related to diabetes.    Called and left message with RN at [facility] indicating pt needs close PCP follow up after d/c due to possibly being new to insulin and new DM diagnosis.  Spoke with [employer], Pt RN, Will make sure she injects and checks CBGs prior to d/c.  Inpatient Diabetes Program Recommendations:    At time of d/c: Glucose meter kit (order #[id number]) Insulin pen needles (order [phone]) Lantus Solostar insulin pen (order [phone])            Pt has directions and link to fill out copay assistance card for $0 copay) Metformin BID  Thanks,  Tama Headings RN, MSN, BC-ADM Inpatient Diabetes Coordinator Team Pager [phone] (8a-5p)

## 2019-01-27 NOTE — Progress Notes (Addendum)
PROGRESS NOTE    Anita Hunter  SLH:[id number] DOB: October 07, 1990 DOA: 01/26/2019 PCP: [name], MD   Brief Narrative: Anita Hunter is a 28 y.o. female with medical history significant of chronic back pain, morbid obesity, eczema, seasonal allergies, sleep apnea, type 2 diabetes who is coming to the emergency department due to progressively worse shortness of breath for the past 2 days associated with sinus congestion, fatigue, malaise, low-grade temperatures and chills.  She had a history of exposure to a positive COVID-19 sick contact 2 days before the onset of symptoms.   Assessment & Plan:   Principal Problem:   Pneumonia due to COVID-19 virus She states that she feels a lot better today. However, she got tired going to the bathroom. Continue glucocorticoids Solu-Medrol 80 mg every 12 hours. Follow-up CBC, CMP and inflammatory markers.  Active Problems:   Morbid obesity with BMI of 50.0-59.9, adult ([place]) Advised about lifestyle modifications. She will follow-up with dietitian as an outpatient    Obstructive sleep apnea Will need follow-up and may be outpatient sleep study.    Type 2 diabetes mellitus (HCC) Continue carbohydrate modified diet. Increase RI SS before meals. Consult diabetes educator.    Hypomagnesemia Replaced yesterday. Will follow-up level.    NASH (nonalcoholic steatohepatitis) Lab and imaging results discussed at length. Advised weight loss to decrease hepatic asteatosis. Will begin Actigall 300 mg p.o. 3 times daily. Shoe follow-up with PCP and GI as an outpatient.   DVT prophylaxis: Lovenox. Code Status: Full. Family Communication:  Disposition Plan: Continue treatment and support for COVID-19 respiratory disease.   Consultants: N/A.   Procedures: None.   Antimicrobials: No antibiotics are currently being used.   Subjective: The patient states that she feels better today.  She is able to breathe better, but still getting  dyspneic when ambulating.  She has been thirsty and has been trying to keep hydrated.  The phlebotomists had difficulty drawing blood twice this morning.  She seems to be mildly volume depleted, so this may make venipunctures more difficult.  Objective: Vitals:   01/26/19 1651 01/26/19 1950 01/26/19 2050 01/27/19 0506  BP: (!) 146/81 139/87  122/72  Pulse: 96 96  79  Resp: 20 (!) 22  (!) 24  Temp: 99.1 F (37.3 C) 98.2 F (36.8 C)  98.4 F (36.9 C)  TempSrc: Oral Oral  Oral  SpO2: 96% 94%  96%  Weight:   (!) 164 kg   Height:   5' 9"  (1.753 m)     Intake/Output Summary (Last 24 hours) at 01/27/2019 0842 Last data filed at 01/27/2019 0600 Gross per 24 hour  Intake 980 ml  Output 1200 ml  Net -220 ml   Filed Weights   01/26/19 2050  Weight: (!) 164 kg    Examination:  General exam: Appears calm and comfortable  Respiratory system: Clear to auscultation. Respiratory effort normal. Cardiovascular system: S1 & S2 heard, RRR. No JVD, murmurs, rubs, gallops or clicks. No pedal edema. Gastrointestinal system: Abdomen is nondistended, obese, soft and nontender. No organomegaly or masses felt. Normal bowel sounds heard. Central nervous system: Alert and oriented. No focal neurological deficits. Extremities: Symmetric 5 x 5 power. Skin: No rashes, lesions or ulcers Psychiatry: Judgement and insight appear normal. Mood & affect appropriate.   Data Reviewed: I have personally reviewed following labs and imaging studies  CBC: Recent Labs  Lab 01/26/19 1329  WBC 6.3  NEUTROABS 5.3  HGB 13.4  HCT 43.5  MCV 89.5  PLT  834   Basic Metabolic Panel: Recent Labs  Lab 01/26/19 1329 01/26/19 1521  NA 135  --   K 4.3  --   CL 96*  --   CO2 28  --   GLUCOSE 257*  --   BUN 7  --   CREATININE 0.72  --   CALCIUM 8.3*  --   MG  --  1.5*  PHOS  --  2.8   GFR: Estimated Creatinine Clearance: 175.6 mL/min (by C-G formula based on SCr of 0.72 mg/dL). Liver Function Tests: Recent  Labs  Lab 01/26/19 1521  AST 287*  ALT 156*  ALKPHOS 75  BILITOT 0.4  PROT 8.5*  ALBUMIN 3.8   No results for input(s): LIPASE, AMYLASE in the last 168 hours. No results for input(s): AMMONIA in the last 168 hours. Coagulation Profile: No results for input(s): INR, PROTIME in the last 168 hours. Cardiac Enzymes: No results for input(s): CKTOTAL, CKMB, CKMBINDEX, TROPONINI in the last 168 hours. BNP (last 3 results) No results for input(s): PROBNP in the last 8760 hours. HbA1C: Recent Labs    01/26/19 1330  HGBA1C 11.1*   CBG: Recent Labs  Lab 01/26/19 1746 01/26/19 2140 01/27/19 0832  GLUCAP 317* 369* 333*   Lipid Profile: No results for input(s): CHOL, HDL, LDLCALC, TRIG, CHOLHDL, LDLDIRECT in the last 72 hours. Thyroid Function Tests: No results for input(s): TSH, T4TOTAL, FREET4, T3FREE, THYROIDAB in the last 72 hours. Anemia Panel: Recent Labs    01/26/19 1521  FERRITIN 562*   Sepsis Labs: Recent Labs  Lab 01/26/19 1521  PROCALCITON <0.10    CT renal study on 11/28/2018  IMPRESSION: 1. Segmental thickening of the sigmoid colon. While there is distal colonic diverticulosis, inflammation does not appear focally centered upon a diverticular outpouching, favoring a segmental colitis of either inflammatory or infectious etiology. Diverticulitis is a possibility but is less favored. No evidence of perforation or abscess formation. 2. Hepatic steatosis.  Radiology Studies: DG Chest Portable 1 View  Result Date: 01/26/2019 CLINICAL DATA:  Cough.  Coronavirus infection. EXAM: PORTABLE CHEST 1 VIEW COMPARISON:  None. FINDINGS: The heart size and mediastinal contours are within normal limits. Both lungs are clear. The visualized skeletal structures are unremarkable. IMPRESSION: No active disease. Electronically Signed   By: Nelson Chimes M.D.   On: 01/26/2019 11:29     Scheduled Meds: . enoxaparin (LOVENOX) injection  80 mg Subcutaneous Q24H  .  famotidine  20 mg Oral BID  . insulin aspart  0-20 Units Subcutaneous TID WC  . Ipratropium-Albuterol  2 puff Inhalation Q6H  . methylPREDNISolone (SOLU-MEDROL) injection  80 mg Intravenous Q12H  . ursodiol  300 mg Oral TID   Continuous Infusions: . sodium chloride       LOS: 1 day   Time spent: 35 minutes were spent during this follow up visit. I discussed at length her abnormal LFTs and presence of hepatic steatosis on imaging back in October this year. Reinforced need to follow up diabetes, OSA and obesity closely with her PCP.  Reubin Milan, MD [facility]  If 7PM-7AM, please contact night-coverage [email or web address]  01/27/2019, 8:42 AM   This document was prepared using Dragon voice recognition software and may contain some unintended transcription errors.

## 2019-01-28 DIAGNOSIS — D72825 Bandemia: Secondary | ICD-10-CM

## 2019-01-28 DIAGNOSIS — E871 Hypo-osmolality and hyponatremia: Secondary | ICD-10-CM

## 2019-01-28 LAB — GLUCOSE, CAPILLARY
Glucose-Capillary: 276 mg/dL — ABNORMAL HIGH (ref 70–99)
Glucose-Capillary: 283 mg/dL — ABNORMAL HIGH (ref 70–99)
Glucose-Capillary: 284 mg/dL — ABNORMAL HIGH (ref 70–99)
Glucose-Capillary: 291 mg/dL — ABNORMAL HIGH (ref 70–99)
Glucose-Capillary: 306 mg/dL — ABNORMAL HIGH (ref 70–99)
Glucose-Capillary: 313 mg/dL — ABNORMAL HIGH (ref 70–99)

## 2019-01-28 LAB — CBC WITH DIFFERENTIAL/PLATELET
Abs Immature Granulocytes: 0.06 10*3/uL (ref 0.00–0.07)
Basophils Absolute: 0 10*3/uL (ref 0.0–0.1)
Basophils Relative: 0 %
Eosinophils Absolute: 0 10*3/uL (ref 0.0–0.5)
Eosinophils Relative: 0 %
HCT: 45.3 % (ref 36.0–46.0)
Hemoglobin: 13.5 g/dL (ref 12.0–15.0)
Immature Granulocytes: 1 %
Lymphocytes Relative: 7 %
Lymphs Abs: 0.8 10*3/uL (ref 0.7–4.0)
MCH: 26.9 pg (ref 26.0–34.0)
MCHC: 29.8 g/dL — ABNORMAL LOW (ref 30.0–36.0)
MCV: 90.2 fL (ref 80.0–100.0)
Monocytes Absolute: 0.6 10*3/uL (ref 0.1–1.0)
Monocytes Relative: 5 %
Neutro Abs: 10.1 10*3/uL — ABNORMAL HIGH (ref 1.7–7.7)
Neutrophils Relative %: 87 %
Platelets: 229 10*3/uL (ref 150–400)
RBC: 5.02 MIL/uL (ref 3.87–5.11)
RDW: 12.6 % (ref 11.5–15.5)
WBC: 11.5 10*3/uL — ABNORMAL HIGH (ref 4.0–10.5)
nRBC: 0 % (ref 0.0–0.2)

## 2019-01-28 LAB — COMPREHENSIVE METABOLIC PANEL
ALT: 109 U/L — ABNORMAL HIGH (ref 0–44)
AST: 82 U/L — ABNORMAL HIGH (ref 15–41)
Albumin: 3.6 g/dL (ref 3.5–5.0)
Alkaline Phosphatase: 79 U/L (ref 38–126)
Anion gap: 11 (ref 5–15)
BUN: 11 mg/dL (ref 6–20)
CO2: 30 mmol/L (ref 22–32)
Calcium: 8 mg/dL — ABNORMAL LOW (ref 8.9–10.3)
Chloride: 93 mmol/L — ABNORMAL LOW (ref 98–111)
Creatinine, Ser: 0.75 mg/dL (ref 0.44–1.00)
GFR calc Af Amer: >60 mL/min (ref >60)
GFR calc non Af Amer: >60 mL/min (ref >60)
Glucose, Bld: 303 mg/dL — ABNORMAL HIGH (ref 70–99)
Potassium: 4.1 mmol/L (ref 3.5–5.1)
Sodium: 134 mmol/L — ABNORMAL LOW (ref 135–145)
Total Bilirubin: 0.5 mg/dL (ref 0.3–1.2)
Total Protein: 8.4 g/dL — ABNORMAL HIGH (ref 6.5–8.1)

## 2019-01-28 LAB — PHOSPHORUS: Phosphorus: 3.7 mg/dL (ref 2.5–4.6)

## 2019-01-28 LAB — D-DIMER, QUANTITATIVE: D-Dimer, Quant: 0.34 ug/mL-FEU (ref 0.00–0.50)

## 2019-01-28 LAB — MAGNESIUM: Magnesium: 2 mg/dL (ref 1.7–2.4)

## 2019-01-28 MED ORDER — INSULIN ASPART 100 UNIT/ML ~~LOC~~ SOLN
4.0000 [IU] | Freq: Three times a day (TID) | SUBCUTANEOUS | Status: DC
  Administered 2019-01-28 (×2): 4 [IU] via SUBCUTANEOUS

## 2019-01-28 MED ORDER — INSULIN ASPART 100 UNIT/ML ~~LOC~~ SOLN
0.0000 [IU] | Freq: Three times a day (TID) | SUBCUTANEOUS | Status: DC
  Administered 2019-01-28 – 2019-01-29 (×2): 15 [IU] via SUBCUTANEOUS
  Administered 2019-01-29 – 2019-01-30 (×4): 11 [IU] via SUBCUTANEOUS
  Administered 2019-01-30 – 2019-01-31 (×2): 15 [IU] via SUBCUTANEOUS
  Administered 2019-01-31 – 2019-02-01 (×2): 11 [IU] via SUBCUTANEOUS

## 2019-01-28 MED ORDER — IPRATROPIUM-ALBUTEROL 20-100 MCG/ACT IN AERS
2.0000 | INHALATION_SPRAY | Freq: Four times a day (QID) | RESPIRATORY_TRACT | Status: DC
  Administered 2019-01-29 – 2019-01-31 (×11): 2 via RESPIRATORY_TRACT
  Filled 2019-01-28: qty 4

## 2019-01-28 MED ORDER — SODIUM CHLORIDE 0.9 % IV SOLN
200.0000 mg | Freq: Once | INTRAVENOUS | Status: AC
  Administered 2019-01-28: 200 mg via INTRAVENOUS
  Filled 2019-01-28: qty 200

## 2019-01-28 MED ORDER — HYDROCODONE-HOMATROPINE 5-1.5 MG/5ML PO SYRP
5.0000 mL | ORAL_SOLUTION | Freq: Once | ORAL | Status: AC
  Administered 2019-01-28: 5 mL via ORAL
  Filled 2019-01-28: qty 5

## 2019-01-28 MED ORDER — SODIUM CHLORIDE 0.9 % IV SOLN
100.0000 mg | Freq: Every day | INTRAVENOUS | Status: AC
  Administered 2019-01-29 – 2019-02-01 (×4): 100 mg via INTRAVENOUS
  Filled 2019-01-28 (×5): qty 20

## 2019-01-28 MED ORDER — INSULIN ASPART 100 UNIT/ML ~~LOC~~ SOLN
0.0000 [IU] | Freq: Every day | SUBCUTANEOUS | Status: DC
  Administered 2019-01-28: 3 [IU] via SUBCUTANEOUS
  Administered 2019-01-29 – 2019-01-30 (×2): 5 [IU] via SUBCUTANEOUS
  Administered 2019-01-31: 4 [IU] via SUBCUTANEOUS

## 2019-01-28 MED ORDER — LINAGLIPTIN 5 MG PO TABS
5.0000 mg | ORAL_TABLET | Freq: Every day | ORAL | Status: DC
  Administered 2019-01-28 – 2019-02-11 (×13): 5 mg via ORAL
  Filled 2019-01-28 (×15): qty 1

## 2019-01-28 NOTE — Progress Notes (Signed)
PROGRESS NOTE  Anita Hunter ZOX:[id number] DOB: 08-26-1990   PCP: [name], MD  Patient is from: Home  DOA: 01/26/2019 LOS: 2  Brief Narrative / Interim history: 28 y.o.femalewith medical history significant ofchronic back pain, morbid obesity, eczema, seasonal allergies, OSA not on CPAP, DM-2 who is coming to the emergency department due to progressively worse shortness of breathfor the past 2 days associated with sinus congestion, fatigue, malaise, low-grade temperatures and chills.    In ED, tachycardic and slightly tachypneic.  BP elevated.  90% on room air and improved to mid 90s on 2 L by [place].  CBC normal.  Inflammatory markers and LFT elevated.  Pro-Cal negative.  CXR without acute finding.  Admitted and started on Solu-Medrol.  Subjective: No major events overnight of this morning.  Reports shortness of breath and productive cough.  Denies chest pain, GI or UTI symptoms.  Objective: Vitals:   01/27/19 1438 01/27/19 1442 01/27/19 2034 01/28/19 0451  BP:  129/70 (!) 140/97 122/82  Pulse: 83 83 94 82  Resp: 20 20 18 20   Temp: 98.5 F (36.9 C)  98.8 F (37.1 C) 98.5 F (36.9 C)  TempSrc: Oral  Oral Oral  SpO2: 100% 92% 93% 96%  Weight:      Height:        Intake/Output Summary (Last 24 hours) at 01/28/2019 1140 Last data filed at 01/28/2019 0600 Gross per 24 hour  Intake 1248.49 ml  Output 0 ml  Net 1248.49 ml   Filed Weights   01/26/19 2050  Weight: (!) 164 kg    Examination:  GENERAL: No acute distress.  Appears well.  HEENT: MMM.  Vision and hearing grossly intact.  NECK: Supple.  No apparent JVD.  RESP:  No IWOB.  Fair aeration and rhonchi bilaterally. CVS:  RRR. Heart sounds normal.  ABD/GI/GU: Bowel sounds present. Soft. Non tender.  MSK/EXT:  No apparent deformity or edema. Moves extremities. SKIN: no apparent skin lesion or wound NEURO: Awake, alert and oriented appropriately.  No gross deficit.  PSYCH: Calm. Normal  affect.  Procedures:  None  Assessment & Plan: COVID-19 pneumonia: Desaturated to 90% on RA.  Continues to endorse significant dyspnea and cough. Recent Labs    01/26/19 1521 01/28/19 0345  DDIMER 0.53* 0.34  FERRITIN 562*  --   CRP 4.8*  --   -Continue Solu-Medrol 12/15>> -Add remdesivir.  Verbally consented for Actemra after risk-benefit discussion if needed. -Inhalers, mucolytic's, antitussive and incentive spirometry -Subcu Lovenox for VT prophylaxis. -OOB  Elevated liver enzymes: Likely due to COVID-19 pneumonia.  No GI symptoms.  Improving. -Continue trending  Uncontrolled diabetes with hyperglycemia: A1c 11.1%.  Only on Metformin at home. Recent Labs    01/28/19 0449 01/28/19 0843 01/28/19 1128  GLUCAP 313* 283* 284*  -Continue Levemir 16 units twice daily -Increase SSI from thin to resistant -NovoLog 6 units AC -Tradjenta 5 mg daily -Statin  OSA/morbid obesity: BMI 53.4.  Previously evaluated but could not afford CPAP -Needs outpatient sleep study again -Encourage lifestyle change to lose weight.  Hypomagnesemia: Resolved.  Nonalcoholic hepatic steatosis -Encourage lifestyle change to lose weight. -Outpatient follow-up with PCP  Mild hyponatremia -Continue monitoring.  Leukocytosis/bandemia: Likely due to steroid. -Continue monitoring                DVT prophylaxis: Subcu Lovenox Code Status: Full code Family Communication: Patient and/or RN.  Will update later Disposition Plan: Remains inpatient Consultants: None   Microbiology summarized: POC COVID-19 positive.  Sch  Meds:  Scheduled Meds: . enoxaparin (LOVENOX) injection  80 mg Subcutaneous Q24H  . famotidine  20 mg Oral BID  . insulin aspart  0-20 Units Subcutaneous Q4H  . insulin detemir  0.1 Units/kg Subcutaneous BID  . insulin starter kit- pen needles  1 kit Other Once  . Ipratropium-Albuterol  2 puff Inhalation Q6H  . methylPREDNISolone (SOLU-MEDROL) injection  80 mg  Intravenous Q12H  . ursodiol  300 mg Oral TID   Continuous Infusions: . [START ON 01/29/2019] remdesivir 100 mg in NS 100 mL     PRN Meds:.acetaminophen **OR** acetaminophen, guaiFENesin-dextromethorphan, prochlorperazine  Antimicrobials: Anti-infectives (From admission, onward)   Start     Dose/Rate Route Frequency Ordered Stop   01/29/19 1000  remdesivir 100 mg in sodium chloride 0.9 % 100 mL IVPB     100 mg 200 mL/hr over 30 Minutes Intravenous Daily 01/28/19 0732 02/02/19 0959   01/28/19 1000  remdesivir 200 mg in sodium chloride 0.9% 250 mL IVPB     200 mg 580 mL/hr over 30 Minutes Intravenous Once 01/28/19 0732 01/28/19 1005       I have personally reviewed the following labs and images: CBC: Recent Labs  Lab 01/26/19 1329 01/28/19 0852  WBC 6.3 11.5*  NEUTROABS 5.3 10.1*  HGB 13.4 13.5  HCT 43.5 45.3  MCV 89.5 90.2  PLT 196 229   BMP &GFR Recent Labs  Lab 01/26/19 1329 01/26/19 1521 01/28/19 0852  NA 135  --  134*  K 4.3  --  4.1  CL 96*  --  93*  CO2 28  --  30  GLUCOSE 257*  --  303*  BUN 7  --  11  CREATININE 0.72  --  0.75  CALCIUM 8.3*  --  8.0*  MG  --  1.5* 2.0  PHOS  --  2.8 3.7   Estimated Creatinine Clearance: 175.6 mL/min (by C-G formula based on SCr of 0.75 mg/dL). Liver & Pancreas: Recent Labs  Lab 01/26/19 1521 01/28/19 0852  AST 287* 82*  ALT 156* 109*  ALKPHOS 75 79  BILITOT 0.4 0.5  PROT 8.5* 8.4*  ALBUMIN 3.8 3.6   No results for input(s): LIPASE, AMYLASE in the last 168 hours. No results for input(s): AMMONIA in the last 168 hours. Diabetic: Recent Labs    01/26/19 1330  HGBA1C 11.1*   Recent Labs  Lab 01/27/19 2030 01/28/19 0004 01/28/19 0449 01/28/19 0843 01/28/19 1128  GLUCAP 335* 276* 313* 283* 284*   Cardiac Enzymes: No results for input(s): CKTOTAL, CKMB, CKMBINDEX, TROPONINI in the last 168 hours. No results for input(s): PROBNP in the last 8760 hours. Coagulation Profile: No results for input(s):  INR, PROTIME in the last 168 hours. Thyroid Function Tests: No results for input(s): TSH, T4TOTAL, FREET4, T3FREE, THYROIDAB in the last 72 hours. Lipid Profile: No results for input(s): CHOL, HDL, LDLCALC, TRIG, CHOLHDL, LDLDIRECT in the last 72 hours. Anemia Panel: Recent Labs    01/26/19 1521  FERRITIN 562*   Urine analysis:    Component Value Date/Time   COLORURINE YELLOW 11/27/2018 2358   APPEARANCEUR CLOUDY (A) 11/27/2018 2358   LABSPEC 1.025 11/27/2018 2358   PHURINE 6.0 11/27/2018 2358   GLUCOSEU NEGATIVE 11/27/2018 2358   HGBUR NEGATIVE 11/27/2018 2358   BILIRUBINUR NEGATIVE 11/27/2018 2358   KETONESUR 15 (A) 11/27/2018 2358   PROTEINUR NEGATIVE 11/27/2018 2358   NITRITE NEGATIVE 11/27/2018 2358   LEUKOCYTESUR NEGATIVE 11/27/2018 2358   Sepsis Labs: Invalid input(s): PROCALCITONIN, LACTICIDVEN  Microbiology: No results found for this or any previous visit (from the past 240 hour(s)).  Radiology Studies: No results found.  35 minutes with more than 50% spent in reviewing records, counseling patient/family and coordinating care.   Terran Klinke T. [place]  If 7PM-7AM, please contact night-coverage [email or web address] Password [email or web address] 01/28/2019, 11:40 AM

## 2019-01-28 NOTE — Plan of Care (Signed)
  Problem: Education: Goal: Knowledge of General Education information will improve Description: Including pain rating scale, medication(s)/side effects and non-pharmacologic comfort measures Outcome: Progressing   Problem: Clinical Measurements: Goal: Respiratory complications will improve Outcome: Progressing Goal: Cardiovascular complication will be avoided Outcome: Progressing   Problem: Elimination: Goal: Will not experience complications related to urinary retention Outcome: Progressing   Problem: Pain Managment: Goal: General experience of comfort will improve Outcome: Progressing   Problem: Safety: Goal: Ability to remain free from injury will improve Outcome: Progressing

## 2019-01-28 NOTE — Progress Notes (Addendum)
Inpatient Diabetes Program Recommendations  AACE/ADA: New Consensus Statement on Inpatient Glycemic Control (2015)  Target Ranges:  Prepandial:   less than 140 mg/dL      Peak postprandial:   less than 180 mg/dL (1-2 hours)      Critically ill patients:  140 - 180 mg/dL   Lab Results  Component Value Date   GLUCAP 276 (H) 01/28/2019   HGBA1C 11.1 (H) 01/26/2019    Review of Glycemic Control  Diabetes history: New Diagnosis this admission Outpatient Diabetes medications: None Prescribed metformin 500 mg Daily by ED on 05/05/18 for elevated glucose Current orders for Inpatient glycemic control:  Levemir 16 units bid Novolog 0-20 units Q4 hours  Solumedrol 80 mg Q12 hours  Spoke with patient about new diabetes diagnosis yesterday see note.   Inpatient Diabetes Program Recommendations:    Utilizing COVID glycemic control order set  Basal insulin dosing for resistant on steroids Increase Levemir to 25 units bid   At time of d/c: Glucose meter kit (order #[id number]) Insulin pen needles (order #[id number]) Lantus Solostar insulin pen (order [phone])            Pt has directions and link to fill out copay assistance card for $0 copay) Metformin BID  Close follow up with [facility].  Thanks,  Tama Headings RN, MSN, BC-ADM Inpatient Diabetes Coordinator Team Pager [phone] (8a-5p)

## 2019-01-28 NOTE — Progress Notes (Signed)
Attempted to call patient's mother for update but no answer.  Did not leave voicemail.

## 2019-01-28 NOTE — Progress Notes (Signed)
Pt did all of her own insulin injections this shift and was very competent doing so. Unable to give insulin teaching kit d/t not available in pharmacy. O2 Sats stable on 2.5L/[place] this shift also, ranging from 92-94%. Pt up ambulating in her room frequently independently, has been sitting up in chair most of the day and lying in the prone position when napping.

## 2019-01-29 ENCOUNTER — Inpatient Hospital Stay (HOSPITAL_COMMUNITY): Payer: BC Managed Care – PPO

## 2019-01-29 ENCOUNTER — Telehealth: Payer: Self-pay | Admitting: Family Medicine

## 2019-01-29 LAB — COMPREHENSIVE METABOLIC PANEL
ALT: 83 U/L — ABNORMAL HIGH (ref 0–44)
AST: 51 U/L — ABNORMAL HIGH (ref 15–41)
Albumin: 3.5 g/dL (ref 3.5–5.0)
Alkaline Phosphatase: 70 U/L (ref 38–126)
Anion gap: 13 (ref 5–15)
BUN: 13 mg/dL (ref 6–20)
CO2: 26 mmol/L (ref 22–32)
Calcium: 7.9 mg/dL — ABNORMAL LOW (ref 8.9–10.3)
Chloride: 94 mmol/L — ABNORMAL LOW (ref 98–111)
Creatinine, Ser: 0.79 mg/dL (ref 0.44–1.00)
GFR calc Af Amer: >60 mL/min (ref >60)
GFR calc non Af Amer: >60 mL/min (ref >60)
Glucose, Bld: 271 mg/dL — ABNORMAL HIGH (ref 70–99)
Potassium: 3.9 mmol/L (ref 3.5–5.1)
Sodium: 133 mmol/L — ABNORMAL LOW (ref 135–145)
Total Bilirubin: 0.4 mg/dL (ref 0.3–1.2)
Total Protein: 8.2 g/dL — ABNORMAL HIGH (ref 6.5–8.1)

## 2019-01-29 LAB — CBC WITH DIFFERENTIAL/PLATELET
Abs Immature Granulocytes: 0.1 10*3/uL — ABNORMAL HIGH (ref 0.00–0.07)
Abs Immature Granulocytes: 0.06 10*3/uL (ref 0.00–0.07)
Basophils Absolute: 0 10*3/uL (ref 0.0–0.1)
Basophils Absolute: 0 10*3/uL (ref 0.0–0.1)
Basophils Relative: 0 %
Basophils Relative: 0 %
Eosinophils Absolute: 0 10*3/uL (ref 0.0–0.5)
Eosinophils Absolute: 0 10*3/uL (ref 0.0–0.5)
Eosinophils Relative: 0 %
Eosinophils Relative: 0 %
HCT: 47.3 % — ABNORMAL HIGH (ref 36.0–46.0)
HCT: 43 % (ref 36.0–46.0)
Hemoglobin: 13.9 g/dL (ref 12.0–15.0)
Hemoglobin: 13.1 g/dL (ref 12.0–15.0)
Immature Granulocytes: 1 %
Immature Granulocytes: 1 %
Lymphocytes Relative: 5 %
Lymphocytes Relative: 7 %
Lymphs Abs: 0.5 10*3/uL — ABNORMAL LOW (ref 0.7–4.0)
Lymphs Abs: 0.6 10*3/uL — ABNORMAL LOW (ref 0.7–4.0)
MCH: 26.8 pg (ref 26.0–34.0)
MCH: 27.3 pg (ref 26.0–34.0)
MCHC: 29.4 g/dL — ABNORMAL LOW (ref 30.0–36.0)
MCHC: 30.5 g/dL (ref 30.0–36.0)
MCV: 91.1 fL (ref 80.0–100.0)
MCV: 89.6 fL (ref 80.0–100.0)
Monocytes Absolute: 0.5 10*3/uL (ref 0.1–1.0)
Monocytes Absolute: 0.4 10*3/uL (ref 0.1–1.0)
Monocytes Relative: 4 %
Monocytes Relative: 4 %
Neutro Abs: 10.5 10*3/uL — ABNORMAL HIGH (ref 1.7–7.7)
Neutro Abs: 8 10*3/uL — ABNORMAL HIGH (ref 1.7–7.7)
Neutrophils Relative %: 90 %
Neutrophils Relative %: 88 %
Platelets: 237 10*3/uL (ref 150–400)
Platelets: 239 10*3/uL (ref 150–400)
RBC: 5.19 MIL/uL — ABNORMAL HIGH (ref 3.87–5.11)
RBC: 4.8 MIL/uL (ref 3.87–5.11)
RDW: 12.8 % (ref 11.5–15.5)
RDW: 12.8 % (ref 11.5–15.5)
WBC: 11.6 10*3/uL — ABNORMAL HIGH (ref 4.0–10.5)
WBC: 9 10*3/uL (ref 4.0–10.5)
nRBC: 0 % (ref 0.0–0.2)
nRBC: 0 % (ref 0.0–0.2)

## 2019-01-29 LAB — C-REACTIVE PROTEIN: CRP: 6.6 mg/dL — ABNORMAL HIGH (ref <1.0)

## 2019-01-29 LAB — GLUCOSE, CAPILLARY
Glucose-Capillary: 280 mg/dL — ABNORMAL HIGH (ref 70–99)
Glucose-Capillary: 272 mg/dL — ABNORMAL HIGH (ref 70–99)
Glucose-Capillary: 321 mg/dL — ABNORMAL HIGH (ref 70–99)
Glucose-Capillary: 359 mg/dL — ABNORMAL HIGH (ref 70–99)

## 2019-01-29 LAB — MAGNESIUM: Magnesium: 1.9 mg/dL (ref 1.7–2.4)

## 2019-01-29 LAB — D-DIMER, QUANTITATIVE: D-Dimer, Quant: 0.32 ug/mL-FEU (ref 0.00–0.50)

## 2019-01-29 LAB — FERRITIN: Ferritin: 238 ng/mL (ref 11–307)

## 2019-01-29 LAB — PHOSPHORUS: Phosphorus: 3.3 mg/dL (ref 2.5–4.6)

## 2019-01-29 MED ORDER — HYDROCOD POLST-CPM POLST ER 10-8 MG/5ML PO SUER
5.0000 mL | Freq: Once | ORAL | Status: AC
  Administered 2019-01-29: 5 mL via ORAL
  Filled 2019-01-29: qty 5

## 2019-01-29 MED ORDER — INSULIN ASPART 100 UNIT/ML ~~LOC~~ SOLN
6.0000 [IU] | Freq: Three times a day (TID) | SUBCUTANEOUS | Status: DC
  Administered 2019-01-29 – 2019-01-30 (×6): 6 [IU] via SUBCUTANEOUS

## 2019-01-29 MED ORDER — BENZONATATE 100 MG PO CAPS
200.0000 mg | ORAL_CAPSULE | Freq: Three times a day (TID) | ORAL | Status: DC | PRN
  Administered 2019-01-29 – 2019-01-31 (×4): 200 mg via ORAL
  Filled 2019-01-29 (×4): qty 2

## 2019-01-29 MED ORDER — INSULIN DETEMIR 100 UNIT/ML ~~LOC~~ SOLN
0.1500 [IU]/kg | Freq: Two times a day (BID) | SUBCUTANEOUS | Status: DC
  Administered 2019-01-29 – 2019-01-30 (×3): 25 [IU] via SUBCUTANEOUS
  Filled 2019-01-29 (×5): qty 0.25

## 2019-01-29 MED ORDER — TOCILIZUMAB 400 MG/20ML IV SOLN
800.0000 mg | Freq: Once | INTRAVENOUS | Status: AC
  Administered 2019-01-29: 800 mg via INTRAVENOUS
  Filled 2019-01-29: qty 40

## 2019-01-29 MED ORDER — MOMETASONE FURO-FORMOTEROL FUM 200-5 MCG/ACT IN AERO
2.0000 | INHALATION_SPRAY | Freq: Two times a day (BID) | RESPIRATORY_TRACT | Status: DC
  Administered 2019-01-29 – 2019-01-31 (×6): 2 via RESPIRATORY_TRACT
  Filled 2019-01-29: qty 8.8

## 2019-01-29 MED ORDER — ACETAMINOPHEN 325 MG PO TABS
325.0000 mg | ORAL_TABLET | Freq: Once | ORAL | Status: AC
  Administered 2019-01-29: 01:00:00 325 mg via ORAL
  Filled 2019-01-29: qty 1

## 2019-01-29 NOTE — Plan of Care (Addendum)
Pt arrived at [facility] at 1130 am. Arrived on 8L HFNC. Currently on 4L. Dyspnea on exertion. Complaints of pain when coughing. Currently on the chair. Mom was updated and all questions were answered.   Problem: Education: Goal: Knowledge of General Education information will improve Description: Including pain rating scale, medication(s)/side effects and non-pharmacologic comfort measures Outcome: Progressing   Problem: Health Behavior/Discharge Planning: Goal: Ability to manage health-related needs will improve Outcome: Progressing   Problem: Clinical Measurements: Goal: Ability to maintain clinical measurements within normal limits will improve Outcome: Progressing Goal: Will remain free from infection Outcome: Progressing Goal: Diagnostic test results will improve Outcome: Progressing Goal: Respiratory complications will improve Outcome: Progressing Goal: Cardiovascular complication will be avoided Outcome: Progressing   Problem: Activity: Goal: Risk for activity intolerance will decrease Outcome: Progressing   Problem: Nutrition: Goal: Adequate nutrition will be maintained Outcome: Progressing   Problem: Coping: Goal: Level of anxiety will decrease Outcome: Progressing   Problem: Elimination: Goal: Will not experience complications related to bowel motility Outcome: Progressing Goal: Will not experience complications related to urinary retention Outcome: Progressing   Problem: Pain Managment: Goal: General experience of comfort will improve Outcome: Progressing   Problem: Safety: Goal: Ability to remain free from injury will improve Outcome: Progressing

## 2019-01-29 NOTE — Progress Notes (Addendum)
   01/29/19 0000  MEWS Score  Resp (!) 35 (after ambulating to and from Bathroom)  Pulse Rate (!) 102  BP 128/67  Temp (!) 101.1 F (38.4 C)  Level of Consciousness Alert  SpO2 92 %  O2 Device Nasal Cannula  O2 Flow Rate (L/min) 3 L/min  MEWS Score  MEWS RR 2  MEWS Pulse 1  MEWS Systolic 0  MEWS LOC 0  MEWS Temp 1  MEWS Score 4  MEWS Score Color Red  MEWS Assessment  Is this an acute change? Yes  MEWS guidelines implemented *See [place]  Rapid Response Notification  Name of Rapid Response RN Notified Jennye Moccasin  Date Rapid Response Notified 01/29/19  Time Rapid Response Notified 2863  Provider Notification  Provider Name/Title Bedenheimer, NP   Date Provider Notified 01/28/19  Time Provider Notified [phone]  Notification Type Page  Notification Reason Change in status  Response See new orders  Date of Provider Response 01/28/19  Time of Provider Response 0045   RN at bedside.Patient's oxygen saturation dropped to 88% on 2.5L. RN increased Oxygen to 3L [place], Tylenol given for 101.22F (See Mar), RRRN notified, Bodenheimer NP notified. Patient moved to prone position, RR decreased to 22, O2 sat 94% 3L [place]. RN stayed at bedside to complete q15 x4 vital signs per [place] MEWS protocol.   Orders Received: Tylenol 325 mg PO in addition to the 650 mg already given, RN to continue to monitor the patient and update NP with any changes in status.

## 2019-01-29 NOTE — Progress Notes (Signed)
Report given to RN receiving pt at [facility].

## 2019-01-29 NOTE — Progress Notes (Signed)
Patient's oxygen saturation is 90% on 6L HFNC while sleeping in prone position. RN notified Dr. Cyndia Skeeters.   Orders: RN to increase oxygen to 7L HFNC, Notify pharmacy to have Actemra sent up to the floor STAT.

## 2019-01-29 NOTE — Progress Notes (Signed)
RN increased patient's oxygen to 7L HFNC. Patient's Oxygen saturation went from 88% to 90% and continues to fluctuate back down to 88%. RN increased Oxygen to 8L HFNC. Patient's oxygen saturation is fluctuating between 90-93%. RN notified day shift RN and Dr. Cyndia Skeeters.

## 2019-01-29 NOTE — Telephone Encounter (Signed)
Pt sister called because her sister is in the hospital with Covid. She is over weight and also has sleep apnea. She would like her doctor and nutritionist to work along side our Education officer, museum to help her get healthy. jw

## 2019-01-29 NOTE — Progress Notes (Addendum)
RN notified by NT that the Pt's oxygen saturation was dipping into the low 80s on 3L [place] and she was having a hard time recovering. RN called respiratory and notified them of the change in oxygen saturation. Respiratory advised RN to start patient on High flow [place] at 6L. RN and NT at bedside. RN started pt on 6L HFNC. Pt's oxygen saturation increased slowly to 96%. RN notified Bodenheimer, NP of the change in patient status.   Orders received: Chest X-ray

## 2019-01-29 NOTE — Progress Notes (Signed)
Attempted to call report to RN receiving patient at [facility]. Secretary given my call back number [phone].

## 2019-01-29 NOTE — Progress Notes (Signed)
PROGRESS NOTE  Anita Hunter:[id number] DOB: 01-24-1991   PCP: [name], MD  Patient is from: Home  DOA: 01/26/2019 LOS: 3  Brief Narrative / Interim history: 28 y.o.femalewith medical history significant ofchronic back pain, morbid obesity, eczema, seasonal allergies, OSA not on CPAP, DM-2 who is coming to the emergency department due to progressively worse shortness of breathfor the past 2 days associated with sinus congestion, fatigue, malaise, low-grade temperatures and chills.    In ED, tachycardic and slightly tachypneic.  BP elevated.  90% on room air and improved to mid 90s on 2 L by [place].  CBC normal.  Inflammatory markers and LFT elevated.  Pro-Cal negative.  CXR without acute finding.  Admitted and started on Solu-Medrol.  Remdesivir added the next day.    Subjective: Patient desaturated to 80s on 3 L by nasal cannula overnight, and recovered to mid 90s on 6 L by HFNC.  She is now saturating in low 90s on several liters by HFNC.  CXR with bilateral opacities consistent with Covid pneumonia now.  This morning, sitting on the edge of the bed.  Does not seem to be in distress but reports shortness of breath and chest pain with cough.  Denies GI or UTI symptoms.   Objective: Vitals:   01/29/19 0310 01/29/19 0421 01/29/19 0502 01/29/19 0610  BP: (!) 141/77 (!) 153/71 130/70 (!) 146/75  Pulse: 90 90 88 90  Resp: 18 (!) 22 (!) 24 20  Temp: 98.9 F (37.2 C) 97.9 F (36.6 C) 99.8 F (37.7 C) 97.8 F (36.6 C)  TempSrc: Oral  Oral   SpO2: 99% 96% 93% 96%  Weight:      Height:        Intake/Output Summary (Last 24 hours) at 01/29/2019 0806 Last data filed at 01/29/2019 0602 Gross per 24 hour  Intake 1920 ml  Output -  Net 1920 ml   Filed Weights   01/26/19 2050  Weight: (!) 164 kg    Examination:  GENERAL: No acute distress.  Appears well.  HEENT: MMM.  Vision and hearing grossly intact.  NECK: Supple.  No apparent JVD.  RESP: 90 to 93% on 7 L by  HFNC.  No IWOB.  Diminished aeration bilaterally. CVS:  RRR. Heart sounds normal.  ABD/GI/GU: Bowel sounds present. Soft. Non tender.  MSK/EXT:  Moves extremities. No apparent deformity or edema.  SKIN: no apparent skin lesion or wound NEURO: Awake, alert and oriented appropriately.  No apparent focal neuro deficit. PSYCH: Calm. Normal affect.  Procedures:  None  Assessment & Plan: Acute respiratory failure with hypoxia due to COVID-19 pneumonia: Initial CXR negative.  Repeat CXR with bilateral opacities.  Inflammatory markers uptrending.  Now with worsening hypoxia requiring several liters by HFNC to maintain saturation in low 90s. Recent Labs    01/26/19 1521 01/28/19 0345 01/29/19 0309  DDIMER 0.53* 0.34 0.32  FERRITIN 562*  --  238  CRP 4.8*  --  6.6*  -Continue Solu-Medrol 12/15>> -Remdesivir 12/16>> -Increase Actemra start.  Has already consented after risk-benefit discussion. -Start intermittent proning -Inhalers, mucolytic's, antitussive and incentive spirometry -Subcu Lovenox for VT prophylaxis. -Transfer to [place].  Elevated liver enzymes: Likely due to COVID-19 pneumonia.  No GI symptoms.  Improving. -Continue trending  Uncontrolled diabetes with hyperglycemia: A1c 11.1%.  Only on Metformin at home. Recent Labs    01/28/19 1128 01/28/19 1648 01/28/19 2109  GLUCAP 284* 306* 291*  -Increase Levemir 16 to 25 units twice daily -Increase SSI from  thin to resistant -Increase NovoLog 4 to 6 units AC -Continue statin and Tradjenta.  OSA/morbid obesity: BMI 53.4.  Previously evaluated but could not afford CPAP -Needs outpatient sleep study again -Encourage lifestyle change to lose weight.  Hypomagnesemia: Resolved.  Nonalcoholic hepatic steatosis -Encourage lifestyle change to lose weight. -Outpatient follow-up with PCP  Mild hyponatremia: Stable -Continue monitoring.  Leukocytosis/bandemia: Likely due to steroid. -Continue monitoring                 DVT prophylaxis: Subcu Lovenox Code Status: Full code Family Communication: Updated patient's mother over the phone. Disposition Plan: Transfer to [facility]-progressive bed. Consultants: None   Microbiology summarized: POC COVID-19 positive.  Sch Meds:  Scheduled Meds: . enoxaparin (LOVENOX) injection  80 mg Subcutaneous Q24H  . famotidine  20 mg Oral BID  . insulin aspart  0-20 Units Subcutaneous TID WC  . insulin aspart  0-5 Units Subcutaneous QHS  . insulin aspart  6 Units Subcutaneous TID WC  . insulin detemir  0.15 Units/kg Subcutaneous BID  . insulin starter kit- pen needles  1 kit Other Once  . Ipratropium-Albuterol  2 puff Inhalation Q6H WA  . linagliptin  5 mg Oral Daily  . methylPREDNISolone (SOLU-MEDROL) injection  80 mg Intravenous Q12H  . mometasone-formoterol  2 puff Inhalation BID  . ursodiol  300 mg Oral TID   Continuous Infusions: . remdesivir 100 mg in NS 100 mL    . tocilizumab (ACTEMRA) IV 800 mg (01/29/19 0753)   PRN Meds:.acetaminophen **OR** acetaminophen, benzonatate, guaiFENesin-dextromethorphan, prochlorperazine  Antimicrobials: Anti-infectives (From admission, onward)   Start     Dose/Rate Route Frequency Ordered Stop   01/29/19 1000  remdesivir 100 mg in sodium chloride 0.9 % 100 mL IVPB     100 mg 200 mL/hr over 30 Minutes Intravenous Daily 01/28/19 0732 02/02/19 0959   01/28/19 1000  remdesivir 200 mg in sodium chloride 0.9% 250 mL IVPB     200 mg 580 mL/hr over 30 Minutes Intravenous Once 01/28/19 0732 01/28/19 1005       I have personally reviewed the following labs and images: CBC: Recent Labs  Lab 01/26/19 1329 01/28/19 0852 01/29/19 0309  WBC 6.3 11.5* 11.6*  NEUTROABS 5.3 10.1* 10.5*  HGB 13.4 13.5 13.9  HCT 43.5 45.3 47.3*  MCV 89.5 90.2 91.1  PLT 196 229 237   BMP &GFR Recent Labs  Lab 01/26/19 1329 01/26/19 1521 01/28/19 0852 01/29/19 0309  NA 135  --  134* 133*  K 4.3  --  4.1 3.9  CL 96*  --  93* 94*  CO2 28  --   30 26  GLUCOSE 257*  --  303* 271*  BUN 7  --  11 13  CREATININE 0.72  --  0.75 0.79  CALCIUM 8.3*  --  8.0* 7.9*  MG  --  1.5* 2.0 1.9  PHOS  --  2.8 3.7 3.3   Estimated Creatinine Clearance: 175.6 mL/min (by C-G formula based on SCr of 0.79 mg/dL). Liver & Pancreas: Recent Labs  Lab 01/26/19 1521 01/28/19 0852 01/29/19 0309  AST 287* 82* 51*  ALT 156* 109* 83*  ALKPHOS 75 79 70  BILITOT 0.4 0.5 0.4  PROT 8.5* 8.4* 8.2*  ALBUMIN 3.8 3.6 3.5   No results for input(s): LIPASE, AMYLASE in the last 168 hours. No results for input(s): AMMONIA in the last 168 hours. Diabetic: Recent Labs    01/26/19 1330  HGBA1C 11.1*   Recent Labs  Lab 01/28/19  6483 01/28/19 0843 01/28/19 1128 01/28/19 1648 01/28/19 2109  GLUCAP 313* 283* 284* 306* 291*   Cardiac Enzymes: No results for input(s): CKTOTAL, CKMB, CKMBINDEX, TROPONINI in the last 168 hours. No results for input(s): PROBNP in the last 8760 hours. Coagulation Profile: No results for input(s): INR, PROTIME in the last 168 hours. Thyroid Function Tests: No results for input(s): TSH, T4TOTAL, FREET4, T3FREE, THYROIDAB in the last 72 hours. Lipid Profile: No results for input(s): CHOL, HDL, LDLCALC, TRIG, CHOLHDL, LDLDIRECT in the last 72 hours. Anemia Panel: Recent Labs    01/26/19 1521 01/29/19 0309  FERRITIN 562* 238   Urine analysis:    Component Value Date/Time   COLORURINE YELLOW 11/27/2018 2358   APPEARANCEUR CLOUDY (A) 11/27/2018 2358   LABSPEC 1.025 11/27/2018 2358   PHURINE 6.0 11/27/2018 2358   GLUCOSEU NEGATIVE 11/27/2018 2358   HGBUR NEGATIVE 11/27/2018 2358   BILIRUBINUR NEGATIVE 11/27/2018 2358   KETONESUR 15 (A) 11/27/2018 2358   PROTEINUR NEGATIVE 11/27/2018 2358   NITRITE NEGATIVE 11/27/2018 2358   LEUKOCYTESUR NEGATIVE 11/27/2018 2358   Sepsis Labs: Invalid input(s): PROCALCITONIN, [place]  Microbiology: No results found for this or any previous visit (from the past 240 hour(s)).   Radiology Studies: DG CHEST PORT 1 VIEW  Result Date: 01/29/2019 CLINICAL DATA:  Increasing shortness of breath. Low oxygen saturation. COVID-19 positive. EXAM: PORTABLE CHEST 1 VIEW COMPARISON:  02/05/2019 FINDINGS: Low lung volumes. Development of bilateral heterogeneous opacities since prior exam. Prominent heart size likely accentuated by low lung volumes. No pleural fluid or pneumothorax. IMPRESSION: 1. Low lung volumes. Development of bilateral heterogeneous lung opacities consistent with COVID-19 pneumonia. 2. Prominent heart size likely accentuated by low lung volumes. Electronically Signed   By: Keith Rake M.D.   On: 01/29/2019 04:15    Blakleigh Straw T. [place]  If 7PM-7AM, please contact night-coverage [email or web address] Password [email or web address] 01/29/2019, 8:06 AM

## 2019-01-29 NOTE — Progress Notes (Signed)
Pt O2 sat was 98% on arrival. Titrated O2 to 4L

## 2019-01-29 NOTE — Progress Notes (Signed)
Pt belongings when pt arrived at bedside include:   Shoes, shirt, pants, chapstick, phone, phone charger, car keys, wallet with $5 in cash, and hoodie.

## 2019-01-30 LAB — CBC WITH DIFFERENTIAL/PLATELET
Abs Immature Granulocytes: 0.04 10*3/uL (ref 0.00–0.07)
Basophils Absolute: 0 10*3/uL (ref 0.0–0.1)
Basophils Relative: 0 %
Eosinophils Absolute: 0 10*3/uL (ref 0.0–0.5)
Eosinophils Relative: 0 %
HCT: 45.7 % (ref 36.0–46.0)
Hemoglobin: 13.6 g/dL (ref 12.0–15.0)
Immature Granulocytes: 1 %
Lymphocytes Relative: 9 %
Lymphs Abs: 0.7 10*3/uL (ref 0.7–4.0)
MCH: 26.9 pg (ref 26.0–34.0)
MCHC: 29.8 g/dL — ABNORMAL LOW (ref 30.0–36.0)
MCV: 90.3 fL (ref 80.0–100.0)
Monocytes Absolute: 0.3 10*3/uL (ref 0.1–1.0)
Monocytes Relative: 4 %
Neutro Abs: 6.9 10*3/uL (ref 1.7–7.7)
Neutrophils Relative %: 86 %
Platelets: 250 10*3/uL (ref 150–400)
RBC: 5.06 MIL/uL (ref 3.87–5.11)
RDW: 12.8 % (ref 11.5–15.5)
WBC: 8 10*3/uL (ref 4.0–10.5)
nRBC: 0 % (ref 0.0–0.2)

## 2019-01-30 LAB — COMPREHENSIVE METABOLIC PANEL
ALT: 69 U/L — ABNORMAL HIGH (ref 0–44)
AST: 42 U/L — ABNORMAL HIGH (ref 15–41)
Albumin: 3.6 g/dL (ref 3.5–5.0)
Alkaline Phosphatase: 69 U/L (ref 38–126)
Anion gap: 8 (ref 5–15)
BUN: 16 mg/dL (ref 6–20)
CO2: 34 mmol/L — ABNORMAL HIGH (ref 22–32)
Calcium: 7.9 mg/dL — ABNORMAL LOW (ref 8.9–10.3)
Chloride: 95 mmol/L — ABNORMAL LOW (ref 98–111)
Creatinine, Ser: 0.71 mg/dL (ref 0.44–1.00)
GFR calc Af Amer: >60 mL/min (ref >60)
GFR calc non Af Amer: >60 mL/min (ref >60)
Glucose, Bld: 290 mg/dL — ABNORMAL HIGH (ref 70–99)
Potassium: 4.1 mmol/L (ref 3.5–5.1)
Sodium: 137 mmol/L (ref 135–145)
Total Bilirubin: 0.9 mg/dL (ref 0.3–1.2)
Total Protein: 8.7 g/dL — ABNORMAL HIGH (ref 6.5–8.1)

## 2019-01-30 LAB — C-REACTIVE PROTEIN: CRP: 8.4 mg/dL — ABNORMAL HIGH (ref <1.0)

## 2019-01-30 LAB — GLUCOSE, CAPILLARY
Glucose-Capillary: 322 mg/dL — ABNORMAL HIGH (ref 70–99)
Glucose-Capillary: 276 mg/dL — ABNORMAL HIGH (ref 70–99)
Glucose-Capillary: 268 mg/dL — ABNORMAL HIGH (ref 70–99)

## 2019-01-30 LAB — PHOSPHORUS: Phosphorus: 3.3 mg/dL (ref 2.5–4.6)

## 2019-01-30 LAB — FERRITIN: Ferritin: 230 ng/mL (ref 11–307)

## 2019-01-30 LAB — MAGNESIUM: Magnesium: 2.3 mg/dL (ref 1.7–2.4)

## 2019-01-30 LAB — D-DIMER, QUANTITATIVE: D-Dimer, Quant: 0.37 ug/mL-FEU (ref 0.00–0.50)

## 2019-01-30 MED ORDER — INSULIN DETEMIR 100 UNIT/ML ~~LOC~~ SOLN
30.0000 [IU] | Freq: Two times a day (BID) | SUBCUTANEOUS | Status: DC
  Administered 2019-01-30 – 2019-01-31 (×2): 30 [IU] via SUBCUTANEOUS
  Filled 2019-01-30 (×2): qty 0.3

## 2019-01-30 MED ORDER — LORAZEPAM 2 MG/ML IJ SOLN
0.5000 mg | Freq: Once | INTRAMUSCULAR | Status: AC
  Administered 2019-01-30: 0.5 mg via INTRAVENOUS
  Filled 2019-01-30: qty 1

## 2019-01-30 MED ORDER — METHYLPREDNISOLONE SODIUM SUCC 125 MG IJ SOLR
60.0000 mg | Freq: Two times a day (BID) | INTRAMUSCULAR | Status: DC
  Administered 2019-01-30 – 2019-02-01 (×4): 60 mg via INTRAVENOUS
  Filled 2019-01-30 (×4): qty 2

## 2019-01-30 MED ORDER — HYDROCOD POLST-CPM POLST ER 10-8 MG/5ML PO SUER
5.0000 mL | Freq: Two times a day (BID) | ORAL | Status: DC | PRN
  Administered 2019-01-30 – 2019-01-31 (×3): 5 mL via ORAL
  Filled 2019-01-30 (×3): qty 5

## 2019-01-30 MED ORDER — SALINE SPRAY 0.65 % NA SOLN
1.0000 | NASAL | Status: DC | PRN
  Administered 2019-01-30: 1 via NASAL
  Filled 2019-01-30: qty 44

## 2019-01-30 MED ORDER — LORAZEPAM 2 MG/ML IJ SOLN
0.5000 mg | Freq: Once | INTRAMUSCULAR | Status: AC
  Administered 2019-01-31: 0.5 mg via INTRAVENOUS
  Filled 2019-01-30: qty 1

## 2019-01-30 NOTE — Progress Notes (Signed)
Pt. Walked to the bathroom, became very sob, anxious. Md texted per RN, orders received.

## 2019-01-30 NOTE — TOC Initial Note (Signed)
Transition of Care [facility]) - Initial/Assessment Note    Patient Details  Name: Anita Hunter MRN: [id number] Date of Birth: 07-May-1990  Transition of Care [facility]) CM/SW Contact:    Ninfa Meeker, RN Phone Number: 01/30/2019, 2:58 PM  Clinical Narrative:   Patient is a 71 who presented to ER  with progressively worse shortness of breathfor the past 2 days associated with sinus congestion, fatigue, malaise, low-grade temperatures and chills. Patient was admitted, transferred to [facility] for further treatment for pneumonia secondary to [place] 19. Patient is on [facility], Remdesivir until 12/21. Case manager will continue to monitor for needs.                        Patient Goals and CMS Choice        Expected Discharge Plan and Services                                                Prior Living Arrangements/Services                       Activities of Daily Living Home Assistive Devices/Equipment: None ADL Screening (condition at time of admission) Patient's cognitive ability adequate to safely complete daily activities?: Yes Is the patient deaf or have difficulty hearing?: No Does the patient have difficulty seeing, even when wearing glasses/contacts?: No Does the patient have difficulty concentrating, remembering, or making decisions?: No Patient able to express need for assistance with ADLs?: Yes Does the patient have difficulty dressing or bathing?: No Independently performs ADLs?: Yes (appropriate for developmental age) Does the patient have difficulty walking or climbing stairs?: No Weakness of Legs: None Weakness of Arms/Hands: None  Permission Sought/Granted                  Emotional Assessment              Admission diagnosis:  Hypoxia [R09.02] Pneumonia due to COVID-19 virus [U07.1, J12.89] COVID-19 [U07.1] Patient Active Problem List   Diagnosis Date Noted  . NASH (nonalcoholic steatohepatitis) 01/27/2019  . Pneumonia due to  COVID-19 virus 01/26/2019  . Type 2 diabetes mellitus ([place]) 01/26/2019  . Hypomagnesemia 01/26/2019  . Acute non intractable tension-type headache 04/16/2018  . Encounter for annual physical exam 04/16/2018  . Chronic left-sided low back pain without sciatica 03/19/2018  . Seasonal allergic rhinitis 03/19/2018  . RLQ abdominal pain 10/24/2016  . Obstructive sleep apnea 08/27/2016  . Back pain 06/27/2016  . Prediabetes 03/02/2015  . Sinusitis, chronic 03/25/2014  . Tobacco use disorder 06/14/2012  . Knee pain, left 06/13/2012  . Morbid obesity with BMI of 50.0-59.9, adult ([place]) 04/11/2006  . Eczema 04/11/2006   PCP:  [name], MD Pharmacy:   [id number] [place], [place] [address]Phone: [phone]: [phone]    Social Determinants of Health (SDOH) Interventions    Readmission Risk Interventions No flowsheet data found.

## 2019-01-30 NOTE — Plan of Care (Signed)
Pt A&Ox4. VSS, SpO2 >88% on NRB. C/o minimal HA pain not requiring interventions.   Severe SOB while attempting to leave bathroom. Minor panic attack, given 0.13m ativan w/ positive effect. Recovered quickly.   Will continue to monitor & report to oncoming staff  KTomie China   Problem: Education: Goal: Knowledge of General Education information will improve Description: Including pain rating scale, medication(s)/side effects and non-pharmacologic comfort measures Outcome: Progressing   Problem: Health Behavior/Discharge Planning: Goal: Ability to manage health-related needs will improve Outcome: Progressing   Problem: Clinical Measurements: Goal: Ability to maintain clinical measurements within normal limits will improve Outcome: Progressing Goal: Will remain free from infection Outcome: Progressing Goal: Diagnostic test results will improve Outcome: Progressing Goal: Respiratory complications will improve Outcome: Progressing Goal: Cardiovascular complication will be avoided Outcome: Progressing   Problem: Activity: Goal: Risk for activity intolerance will decrease Outcome: Progressing   Problem: Nutrition: Goal: Adequate nutrition will be maintained Outcome: Progressing   Problem: Coping: Goal: Level of anxiety will decrease Outcome: Progressing   Problem: Elimination: Goal: Will not experience complications related to bowel motility Outcome: Progressing Goal: Will not experience complications related to urinary retention Outcome: Progressing   Problem: Pain Managment: Goal: General experience of comfort will improve Outcome: Progressing   Problem: Safety: Goal: Ability to remain free from injury will improve Outcome: Progressing   Problem: Skin Integrity: Goal: Risk for impaired skin integrity will decrease Outcome: Progressing

## 2019-01-30 NOTE — Progress Notes (Signed)
PROGRESS NOTE  Anita Hunter WYO:[id number] DOB: 1990-06-16   PCP: [name], MD  Patient is from: Home  DOA: 01/26/2019 LOS: 4  Brief Narrative / Interim history: 28 y.o.femalewith medical history significant ofchronic back pain, morbid obesity, eczema, seasonal allergies, OSA not on CPAP, DM-2 who is coming to the emergency department due to progressively worse shortness of breathfor the past 2 days associated with sinus congestion, fatigue, malaise, low-grade temperatures and chills. In ED, tachycardic and slightly tachypneic.  BP elevated.  90% on room air and improved to mid 90s on 2 L by [place].  CBC normal.  Inflammatory markers and LFT elevated.  Pro-Cal negative.  CXR without acute finding.  Admitted and started on Solu-Medrol 12/14 - Remdesivir added the following day 12/15.    Subjective: No acute issues or events overnight, generally feeling much improved but still dyspneic with minimal exertion.  Denies headache, fever, chills, chest pain, nausea, vomiting, diarrhea, constipation.   Assessment & Plan: Principal Problem:   Pneumonia due to COVID-19 virus Active Problems:   Morbid obesity with BMI of 50.0-59.9, adult ([place])   Obstructive sleep apnea   Type 2 diabetes mellitus ([place])   Hypomagnesemia   NASH (nonalcoholic steatohepatitis)   Acute respiratory failure with hypoxia due to COVID-19 pneumonia:  Patient has moderate to severe disease SpO2: (!) 88 % O2 Flow Rate (L/min): 4 L/min Continue to wean oxygen as tolerated (was previously requiring uptitration to 6L HFNC) CXR remarkable for bibasilar patchy infiltrate Recent Labs    01/28/19 0345 01/29/19 0309 01/30/19 0136  DDIMER 0.34 0.32 0.37  FERRITIN  --  238 230  CRP  --  6.6* 8.4*  -Remdesivir 12/16>>12/20 -Consider Actemra if worsening symptoms or CRP >10. Has already consented after risk-benefit discussion. Currently feels improved -downtrending oxygen. No indication currenlty. -Continue  incentive spiro, flutter, proning, early ambulation as tolerated -Inhalers, mucolytic's, antitussive as necessary  Elevated liver enzymes:  Hepatic Function Latest Ref Rng & Units 01/30/2019 01/29/2019 01/28/2019  Total Protein 6.5 - 8.1 g/dL 8.7(H) 8.2(H) 8.4(H)  Albumin 3.5 - 5.0 g/dL 3.6 3.5 3.6  AST 15 - 41 U/L 42(H) 51(H) 82(H)  ALT 0 - 44 U/L 69(H) 83(H) 109(H)  Alk Phosphatase 38 - 126 U/L 69 70 79  Total Bilirubin 0.3 - 1.2 mg/dL 0.9 0.4 0.5  Bilirubin, Direct 0.0 - 0.2 mg/dL - - -  -Generally improving -Likely 2/2 covid, dehydration and poor PO intake -Continue to monitor closely given remdesivir administration   Uncontrolled diabetes type 2 with hyperglycemia:  A1c 11.1%.  Only on Metformin at home. Recent Labs    01/29/19 1208 01/29/19 1619 01/29/19 2022  GLUCAP 272* 321* 359*  -Increase Levemir 25 to 30 units twice daily -Continue sliding scale - aggressive dosing -Increase NovoLog 4 to 6 units AC -Continue statin and Tradjenta.  OSA/morbid obesity:  -BMI 53.4.  Previously evaluated but could not afford CPAP -Needs outpatient sleep study again -Encourage lifestyle change to lose weight.  Hypomagnesemia:  -Resolved.  Nonalcoholic hepatic steatosis -Encourage lifestyle change to lose weight. -Outpatient follow-up with PCP  Mild hyponatremia: Stable -Continue monitoring.  Leukemoid reaction likely in the setting of steroids as above, resolving -Continue monitoring   DVT prophylaxis: Subcu Lovenox Code Status: Full code Family Communication: Updated patient's mother over the phone. Disposition Plan: Continuous as inpatient, continues to require IV medications, supplemental oxygen and close monitoring given lab abnormalities and moderate COVID-19 pneumonia with ongoing hypoxia as above.  Consultants: None   Objective: Vitals:  01/29/19 1600 01/29/19 2000 01/30/19 0841 01/30/19 1152  BP: 135/81 119/65 135/78 124/72  Pulse:  92 (!) 102 94  Resp:  (!)  22 20   Temp: 98.6 F (37 C) 98.5 F (36.9 C) 98 F (36.7 C) 97.8 F (36.6 C)  TempSrc: Oral Oral Oral Oral  SpO2: 92%  (!) 87% (!) 88%  Weight:      Height:        Intake/Output Summary (Last 24 hours) at 01/30/2019 1322 Last data filed at 01/29/2019 1430 Gross per 24 hour  Intake 240 ml  Output --  Net 240 ml   Filed Weights   01/26/19 2050  Weight: (!) 164 kg    Examination:  GENERAL: No acute distress.  Appears well.  HEENT: MMM.  Vision and hearing grossly intact.  NECK: Supple.  No apparent JVD.  RESP: Diminished aeration bilaterally.  Without overt wheeze rhonchi or rales.   CVS:  RRR. Heart sounds normal.  ABD/GI/GU: Bowel sounds present. Soft. Non tender.  MSK/EXT:  Moves extremities. No apparent deformity or edema.  SKIN: no apparent skin lesion or wound NEURO: Awake, alert and oriented appropriately.  No apparent focal neuro deficit. PSYCH: Calm. Normal affect.  Procedures:  None  Microbiology summarized: POC COVID-19 positive.  Sch Meds:  Scheduled Meds: . enoxaparin (LOVENOX) injection  80 mg Subcutaneous Q24H  . famotidine  20 mg Oral BID  . insulin aspart  0-20 Units Subcutaneous TID WC  . insulin aspart  0-5 Units Subcutaneous QHS  . insulin aspart  6 Units Subcutaneous TID WC  . insulin detemir  0.15 Units/kg Subcutaneous BID  . insulin starter kit- pen needles  1 kit Other Once  . Ipratropium-Albuterol  2 puff Inhalation Q6H WA  . linagliptin  5 mg Oral Daily  . methylPREDNISolone (SOLU-MEDROL) injection  80 mg Intravenous Q12H  . mometasone-formoterol  2 puff Inhalation BID  . ursodiol  300 mg Oral TID   Continuous Infusions: . remdesivir 100 mg in NS 100 mL 100 mg (01/30/19 0857)   PRN Meds:.acetaminophen **OR** acetaminophen, benzonatate, guaiFENesin-dextromethorphan, prochlorperazine  Antimicrobials: Anti-infectives (From admission, onward)   Start     Dose/Rate Route Frequency Ordered Stop   01/29/19 1000  remdesivir 100 mg in  sodium chloride 0.9 % 100 mL IVPB     100 mg 200 mL/hr over 30 Minutes Intravenous Daily 01/28/19 0732 02/02/19 0959   01/28/19 1000  remdesivir 200 mg in sodium chloride 0.9% 250 mL IVPB     200 mg 580 mL/hr over 30 Minutes Intravenous Once 01/28/19 0732 01/28/19 1005       I have personally reviewed the following labs and images: CBC: Recent Labs  Lab 01/26/19 1329 01/28/19 0852 01/29/19 0309 01/29/19 1650 01/30/19 0136  WBC 6.3 11.5* 11.6* 9.0 8.0  NEUTROABS 5.3 10.1* 10.5* 8.0* 6.9  HGB 13.4 13.5 13.9 13.1 13.6  HCT 43.5 45.3 47.3* 43.0 45.7  MCV 89.5 90.2 91.1 89.6 90.3  PLT 196 229 237 239 250   BMP &GFR Recent Labs  Lab 01/26/19 1329 01/26/19 1521 01/28/19 0852 01/29/19 0309 01/30/19 0136  NA 135  --  134* 133* 137  K 4.3  --  4.1 3.9 4.1  CL 96*  --  93* 94* 95*  CO2 28  --  30 26 34*  GLUCOSE 257*  --  303* 271* 290*  BUN 7  --  11 13 16   CREATININE 0.72  --  0.75 0.79 0.71  CALCIUM 8.3*  --  8.0* 7.9* 7.9*  MG  --  1.5* 2.0 1.9 2.3  PHOS  --  2.8 3.7 3.3 3.3   Estimated Creatinine Clearance: 175.6 mL/min (by C-G formula based on SCr of 0.71 mg/dL). Liver & Pancreas: Recent Labs  Lab 01/26/19 1521 01/28/19 0852 01/29/19 0309 01/30/19 0136  AST 287* 82* 51* 42*  ALT 156* 109* 83* 69*  ALKPHOS 75 79 70 69  BILITOT 0.4 0.5 0.4 0.9  PROT 8.5* 8.4* 8.2* 8.7*  ALBUMIN 3.8 3.6 3.5 3.6   No results for input(s): LIPASE, AMYLASE in the last 168 hours. No results for input(s): AMMONIA in the last 168 hours. Diabetic: No results for input(s): HGBA1C in the last 72 hours. Recent Labs  Lab 01/29/19 1208 01/29/19 1619 01/29/19 2022 01/30/19 0743 01/30/19 1150  GLUCAP 272* 321* 359* 322* 276*   Cardiac Enzymes: No results for input(s): CKTOTAL, CKMB, CKMBINDEX, TROPONINI in the last 168 hours. No results for input(s): PROBNP in the last 8760 hours. Coagulation Profile: No results for input(s): INR, PROTIME in the last 168 hours. Thyroid Function  Tests: No results for input(s): TSH, T4TOTAL, FREET4, T3FREE, THYROIDAB in the last 72 hours. Lipid Profile: No results for input(s): CHOL, HDL, LDLCALC, TRIG, CHOLHDL, LDLDIRECT in the last 72 hours. Anemia Panel: Recent Labs    01/29/19 0309 01/30/19 0136  FERRITIN 238 230   Urine analysis:    Component Value Date/Time   COLORURINE YELLOW 11/27/2018 2358   APPEARANCEUR CLOUDY (A) 11/27/2018 2358   LABSPEC 1.025 11/27/2018 2358   PHURINE 6.0 11/27/2018 2358   GLUCOSEU NEGATIVE 11/27/2018 2358   HGBUR NEGATIVE 11/27/2018 2358   BILIRUBINUR NEGATIVE 11/27/2018 2358   KETONESUR 15 (A) 11/27/2018 2358   PROTEINUR NEGATIVE 11/27/2018 2358   NITRITE NEGATIVE 11/27/2018 2358   LEUKOCYTESUR NEGATIVE 11/27/2018 2358   Microbiology: No results found for this or any previous visit (from the past 240 hour(s)).  Radiology Studies: No results found.  Holli Humbles DO [facility]  If 7PM-7AM, please contact night-coverage [email or web address] Password [email or web address] 01/30/2019, 1:22 PM

## 2019-01-30 NOTE — Progress Notes (Signed)
Results for FLORETTA, PETRO (MRN [id number]) as of 01/30/2019 11:15  Ref. Range 01/29/2019 12:08 01/29/2019 16:19 01/29/2019 20:22 01/30/2019 07:43  Glucose-Capillary Latest Ref Range: 70 - 99 mg/dL 272 (H) 321 (H) 359 (H) 322 (H)  Noted that blood sugars continue to be greater than 200 mg/dl.   If blood sugars continue to be elevated, recommend increasing Levemir to 30 units BID. Titrate dosages as needed.   Harvel Ricks RN BSN CDE Diabetes Coordinator Pager: [phone]  8am-5pm

## 2019-01-31 ENCOUNTER — Inpatient Hospital Stay (HOSPITAL_COMMUNITY): Payer: BC Managed Care – PPO | Admitting: Certified Registered Nurse Anesthetist

## 2019-01-31 ENCOUNTER — Inpatient Hospital Stay: Payer: Self-pay

## 2019-01-31 ENCOUNTER — Inpatient Hospital Stay (HOSPITAL_COMMUNITY): Payer: BC Managed Care – PPO

## 2019-01-31 DIAGNOSIS — J1289 Other viral pneumonia: Secondary | ICD-10-CM

## 2019-01-31 DIAGNOSIS — U071 COVID-19: Principal | ICD-10-CM

## 2019-01-31 LAB — CBC WITH DIFFERENTIAL/PLATELET
Abs Immature Granulocytes: 0.07 10*3/uL (ref 0.00–0.07)
Basophils Absolute: 0 10*3/uL (ref 0.0–0.1)
Basophils Relative: 0 %
Eosinophils Absolute: 0 10*3/uL (ref 0.0–0.5)
Eosinophils Relative: 0 %
HCT: 47.3 % — ABNORMAL HIGH (ref 36.0–46.0)
Hemoglobin: 14 g/dL (ref 12.0–15.0)
Immature Granulocytes: 1 %
Lymphocytes Relative: 9 %
Lymphs Abs: 0.9 10*3/uL (ref 0.7–4.0)
MCH: 26.9 pg (ref 26.0–34.0)
MCHC: 29.6 g/dL — ABNORMAL LOW (ref 30.0–36.0)
MCV: 91 fL (ref 80.0–100.0)
Monocytes Absolute: 0.4 10*3/uL (ref 0.1–1.0)
Monocytes Relative: 4 %
Neutro Abs: 8.6 10*3/uL — ABNORMAL HIGH (ref 1.7–7.7)
Neutrophils Relative %: 86 %
Platelets: 295 10*3/uL (ref 150–400)
RBC: 5.2 MIL/uL — ABNORMAL HIGH (ref 3.87–5.11)
RDW: 13 % (ref 11.5–15.5)
WBC: 9.9 10*3/uL (ref 4.0–10.5)
nRBC: 0 % (ref 0.0–0.2)

## 2019-01-31 LAB — COMPREHENSIVE METABOLIC PANEL
ALT: 58 U/L — ABNORMAL HIGH (ref 0–44)
AST: 45 U/L — ABNORMAL HIGH (ref 15–41)
Albumin: 3.6 g/dL (ref 3.5–5.0)
Alkaline Phosphatase: 69 U/L (ref 38–126)
Anion gap: 9 (ref 5–15)
BUN: 17 mg/dL (ref 6–20)
CO2: 34 mmol/L — ABNORMAL HIGH (ref 22–32)
Calcium: 8 mg/dL — ABNORMAL LOW (ref 8.9–10.3)
Chloride: 92 mmol/L — ABNORMAL LOW (ref 98–111)
Creatinine, Ser: 0.69 mg/dL (ref 0.44–1.00)
GFR calc Af Amer: >60 mL/min (ref >60)
GFR calc non Af Amer: >60 mL/min (ref >60)
Glucose, Bld: 304 mg/dL — ABNORMAL HIGH (ref 70–99)
Potassium: 4.1 mmol/L (ref 3.5–5.1)
Sodium: 135 mmol/L (ref 135–145)
Total Bilirubin: 0.9 mg/dL (ref 0.3–1.2)
Total Protein: 8.1 g/dL (ref 6.5–8.1)

## 2019-01-31 LAB — MAGNESIUM: Magnesium: 2.3 mg/dL (ref 1.7–2.4)

## 2019-01-31 LAB — GLUCOSE, CAPILLARY
Glucose-Capillary: 290 mg/dL — ABNORMAL HIGH (ref 70–99)
Glucose-Capillary: 290 mg/dL — ABNORMAL HIGH (ref 70–99)
Glucose-Capillary: 334 mg/dL — ABNORMAL HIGH (ref 70–99)
Glucose-Capillary: 337 mg/dL — ABNORMAL HIGH (ref 70–99)

## 2019-01-31 LAB — FERRITIN: Ferritin: 222 ng/mL (ref 11–307)

## 2019-01-31 LAB — D-DIMER, QUANTITATIVE: D-Dimer, Quant: 0.41 ug/mL-FEU (ref 0.00–0.50)

## 2019-01-31 LAB — PHOSPHORUS: Phosphorus: 3.1 mg/dL (ref 2.5–4.6)

## 2019-01-31 LAB — C-REACTIVE PROTEIN: CRP: 4.2 mg/dL — ABNORMAL HIGH (ref <1.0)

## 2019-01-31 MED ORDER — PROPOFOL 10 MG/ML IV BOLUS
INTRAVENOUS | Status: AC
  Administered 2019-02-01: 80 ug/kg/min via INTRAVENOUS
  Filled 2019-01-31: qty 20

## 2019-01-31 MED ORDER — ACETAMINOPHEN 325 MG PO TABS
650.0000 mg | ORAL_TABLET | Freq: Four times a day (QID) | ORAL | Status: DC | PRN
  Administered 2019-02-03 – 2019-02-20 (×12): 650 mg via ORAL
  Filled 2019-01-31 (×13): qty 2

## 2019-01-31 MED ORDER — ORAL CARE MOUTH RINSE
15.0000 mL | Freq: Two times a day (BID) | OROMUCOSAL | Status: DC
  Administered 2019-01-31 (×2): 15 mL via OROMUCOSAL

## 2019-01-31 MED ORDER — DEXMEDETOMIDINE HCL IN NACL 400 MCG/100ML IV SOLN
0.0000 ug/kg/h | INTRAVENOUS | Status: DC
  Administered 2019-01-31: 0.4 ug/kg/h via INTRAVENOUS
  Filled 2019-01-31: qty 100

## 2019-01-31 MED ORDER — VECURONIUM BROMIDE 10 MG IV SOLR
INTRAVENOUS | Status: AC
  Filled 2019-01-31: qty 10

## 2019-01-31 MED ORDER — FENTANYL 2500MCG IN NS 250ML (10MCG/ML) PREMIX INFUSION
0.0000 ug/h | INTRAVENOUS | Status: DC
  Administered 2019-01-31: 100 ug/h via INTRAVENOUS
  Filled 2019-01-31: qty 250

## 2019-01-31 MED ORDER — CHLORHEXIDINE GLUCONATE 0.12 % MT SOLN
15.0000 mL | Freq: Two times a day (BID) | OROMUCOSAL | Status: DC
  Administered 2019-01-31: 15 mL via OROMUCOSAL
  Filled 2019-01-31: qty 15

## 2019-01-31 MED ORDER — STERILE WATER FOR INJECTION IJ SOLN
INTRAMUSCULAR | Status: AC
  Filled 2019-01-31: qty 10

## 2019-01-31 MED ORDER — CHLORHEXIDINE GLUCONATE CLOTH 2 % EX PADS
6.0000 | MEDICATED_PAD | Freq: Every day | CUTANEOUS | Status: DC
  Administered 2019-01-31: 6 via TOPICAL

## 2019-01-31 MED ORDER — MAGNESIUM SULFATE 2 GM/50ML IV SOLN
2.0000 g | Freq: Once | INTRAVENOUS | Status: AC
  Administered 2019-01-31: 2 g via INTRAVENOUS
  Filled 2019-01-31: qty 50

## 2019-01-31 MED ORDER — TOCILIZUMAB 400 MG/20ML IV SOLN
800.0000 mg | Freq: Once | INTRAVENOUS | Status: DC
  Filled 2019-01-31: qty 40

## 2019-01-31 MED ORDER — LORAZEPAM 2 MG/ML IJ SOLN
1.0000 mg | Freq: Once | INTRAMUSCULAR | Status: AC
  Administered 2019-01-31: 1 mg via INTRAVENOUS
  Filled 2019-01-31: qty 1

## 2019-01-31 MED ORDER — SODIUM CHLORIDE 0.9 % IV SOLN: INTRAVENOUS | Status: DC | PRN

## 2019-01-31 MED ORDER — FENTANYL CITRATE (PF) 100 MCG/2ML IJ SOLN
INTRAMUSCULAR | Status: AC
  Administered 2019-01-31: 100 ug
  Filled 2019-01-31: qty 2

## 2019-01-31 MED ORDER — SUCCINYLCHOLINE CHLORIDE 200 MG/10ML IV SOSY
PREFILLED_SYRINGE | INTRAVENOUS | Status: AC
  Filled 2019-01-31: qty 10

## 2019-01-31 MED ORDER — SUCCINYLCHOLINE CHLORIDE 20 MG/ML IJ SOLN
INTRAMUSCULAR | Status: DC | PRN
  Administered 2019-01-31: 140 mg via INTRAVENOUS

## 2019-01-31 MED ORDER — SODIUM CHLORIDE 0.9% FLUSH: 10.0000 mL | INTRAVENOUS | Status: DC | PRN

## 2019-01-31 MED ORDER — FUROSEMIDE 10 MG/ML IJ SOLN
40.0000 mg | Freq: Three times a day (TID) | INTRAMUSCULAR | Status: AC
  Administered 2019-01-31 – 2019-02-01 (×3): 40 mg via INTRAVENOUS
  Filled 2019-01-31 (×3): qty 4

## 2019-01-31 MED ORDER — ROCURONIUM BROMIDE 10 MG/ML (PF) SYRINGE
PREFILLED_SYRINGE | INTRAVENOUS | Status: AC
  Filled 2019-01-31: qty 10

## 2019-01-31 MED ORDER — ENOXAPARIN SODIUM 80 MG/0.8ML ~~LOC~~ SOLN
80.0000 mg | Freq: Two times a day (BID) | SUBCUTANEOUS | Status: DC
  Administered 2019-02-01 – 2019-02-09 (×18): 80 mg via SUBCUTANEOUS
  Filled 2019-01-31 (×17): qty 0.8

## 2019-01-31 MED ORDER — ETOMIDATE 2 MG/ML IV SOLN
INTRAVENOUS | Status: AC
  Filled 2019-01-31: qty 20

## 2019-01-31 MED ORDER — PHENYLEPHRINE HCL-NACL 10-0.9 MG/250ML-% IV SOLN
0.0000 ug/min | INTRAVENOUS | Status: DC
  Administered 2019-02-01: 60 ug/min via INTRAVENOUS

## 2019-01-31 MED ORDER — ENOXAPARIN SODIUM 80 MG/0.8ML ~~LOC~~ SOLN
80.0000 mg | Freq: Two times a day (BID) | SUBCUTANEOUS | Status: DC
  Administered 2019-01-31: 80 mg via SUBCUTANEOUS
  Filled 2019-01-31 (×2): qty 0.8

## 2019-01-31 MED ORDER — LORAZEPAM 2 MG/ML IJ SOLN
0.5000 mg | INTRAMUSCULAR | Status: DC | PRN
  Administered 2019-01-31 (×2): 0.5 mg via INTRAVENOUS
  Filled 2019-01-31 (×2): qty 1

## 2019-01-31 MED ORDER — SODIUM CHLORIDE 0.9% FLUSH
10.0000 mL | Freq: Two times a day (BID) | INTRAVENOUS | Status: DC
  Administered 2019-01-31: 40 mL
  Administered 2019-02-01 – 2019-02-10 (×18): 10 mL
  Administered 2019-02-11: 20 mL
  Administered 2019-02-11 – 2019-02-19 (×14): 10 mL

## 2019-01-31 MED ORDER — PHENYLEPHRINE HCL (PRESSORS) 10 MG/ML IV SOLN
INTRAVENOUS | Status: DC | PRN
  Administered 2019-01-31: 80 ug via INTRAVENOUS

## 2019-01-31 MED ORDER — MIDAZOLAM HCL 2 MG/2ML IJ SOLN
INTRAMUSCULAR | Status: AC
  Administered 2019-01-31: 2 mg
  Filled 2019-01-31: qty 4

## 2019-01-31 MED ORDER — PROPOFOL 10 MG/ML IV BOLUS
INTRAVENOUS | Status: DC | PRN
  Administered 2019-01-31: 150 mg via INTRAVENOUS

## 2019-01-31 MED ORDER — PROPOFOL 1000 MG/100ML IV EMUL
5.0000 ug/kg/min | INTRAVENOUS | Status: DC
  Administered 2019-02-01: 40 ug/kg/min via INTRAVENOUS
  Filled 2019-01-31: qty 100

## 2019-01-31 MED ORDER — PHENYLEPHRINE HCL-NACL 10-0.9 MG/250ML-% IV SOLN
INTRAVENOUS | Status: AC
  Filled 2019-01-31: qty 250

## 2019-01-31 MED ORDER — INSULIN DETEMIR 100 UNIT/ML ~~LOC~~ SOLN
40.0000 [IU] | Freq: Two times a day (BID) | SUBCUTANEOUS | Status: DC
  Administered 2019-01-31 – 2019-02-01 (×2): 40 [IU] via SUBCUTANEOUS
  Filled 2019-01-31 (×2): qty 0.4

## 2019-01-31 MED ORDER — MIDAZOLAM HCL 2 MG/2ML IJ SOLN
2.0000 mg | Freq: Once | INTRAMUSCULAR | Status: AC
  Administered 2019-01-31: 2 mg via INTRAVENOUS

## 2019-01-31 NOTE — Progress Notes (Signed)
Pt. Transferred to ICU on monitor and O2. RN and RT at bedside. Pt. Stable when arrived to ICU. RN will notified family.

## 2019-01-31 NOTE — Procedures (Signed)
Arterial Catheter Insertion Procedure Note YURITZA PAULHUS [id number] 02/24/1990  Procedure: Insertion of Arterial Catheter  Indications: Blood pressure monitoring and Frequent blood sampling  Procedure Details Consent: Risks of procedure as well as the alternatives and risks of each were explained to the (patient/caregiver).  Consent for procedure obtained. and Unable to obtain consent because of emergent medical necessity. Time Out: Verified patient identification, verified procedure, site/side was marked, verified correct patient position, special equipment/implants available, medications/allergies/relevent history reviewed, required imaging and test results available.  Performed  Maximum sterile technique was used including antiseptics, cap, gloves, gown, hand hygiene and mask. Skin prep: Chlorhexidine; local anesthetic administered 20 gauge catheter was inserted into left radial artery using the Seldinger technique. ULTRASOUND GUIDANCE USED: YES Evaluation Blood flow good; BP tracing good. Complications: No apparent complications.   Ulice Dash 01/31/2019

## 2019-01-31 NOTE — Consult Note (Signed)
NAME:  Anita Hunter, MRN:  [id number], DOB:  [birth year], LOS: 5 ADMISSION DATE:  01/26/2019, CONSULTATION DATE:  01/31/19 REFERRING MD:  Avon Gully, CHIEF COMPLAINT:  breathlessness   Brief History   28 year old woman with hx of mild intermittent asthma, OSA presenting with severe COVID pneumonia.  History of present illness   28 year old woman with severe OSA supposed to be on home BIPAP (issues with mask leaks), DM2, seasonal allergies, chronic back pain presenting with URI symptoms who has developed significant COVID pneumonia.  Transferred to ICU this AM for abrupt worsening O2 sats.  Denies wheezing or chest pain.  She is anxious.  Past Medical History  DM2 Seasonal allergies Mild intermittent asthma OSA/OHS with recommended BIPAP 24/19 cm H2O Chronic back pain  Significant Hospital Events   01/26/19 admitted 01/31/19 transferred to ICU  Consults:  PCCM  Procedures:  N/A  Significant Diagnostic Tests:  CXR low lung volumes, bilateral airspace disease +/- atelectasis  Micro Data:  COVID + HIV neg Pct neg  Antimicrobials:  Actemra 12/17 Remdesivir 12/16 >>12/20    Interim history/subjective:  Transferred to ICU, see above.  Objective   Blood pressure 131/83, pulse 93, temperature 98.7 F (37.1 C), temperature source Oral, resp. rate (!) 28, height 5' 9"  (1.753 m), weight (!) 158.3 kg, SpO2 93 %.    Vent Mode: BIPAP FiO2 (%):  [100 %] 100 % Set Rate:  [8 bmp] 8 bmp  No intake or output data in the 24 hours ending 01/31/19 1256 Filed Weights   01/26/19 2050 01/31/19 0900  Weight: (!) 164 kg (!) 158.3 kg    Examination: General: oveweight woman lying prone in bed HENT: cannot assess Lungs: rhonci and wheezing worse on R, tachypneic Cardiovascular: ext warm, she is prone cannot hear heart Abdomen: cannot evaluate Extremities: trace edema Neuro: moves all 4 ext to command  Skin: no rashes  Resolved Hospital Problem list   N/A  Assessment & Plan:    # Acute hypoxemic respiratory failure due to COVID pneumonia- complicated by underlying OHS, severe anxiety, and possible contributing element of fluid overload.  She has asthma but does not sound in flare. - Lasix challenge, strict I/O, monitor BMET - Has good indication for BIPAP salvage, will initiate - Decision to intubate based on mental status, clinical trajectory - Finish out course of remdesivir and steroids - Check LE duplex, d dimer given rapidity of decline - Increase lovenox  # DM2- A1c 11.1 - Increase levemir - Goal FS 100-180  # Severe anxiety - Benzo and precedex trial  Best practice:  Diet: Diabetic Pain/Anxiety/Delirium protocol (if indicated): see above VAP protocol (if indicated): not indcated at present DVT prophylaxis: increase lovenox dosing 0.64m/kg BID GI prophylaxis: pepcid Glucose control: see above Mobility: to chair if able Code Status: full  Family Communication: per [facility]isposition: ICU, high risk for further decompensation  Labs   CBC: Recent Labs  Lab 01/28/19 0852 01/29/19 0309 01/29/19 1650 01/30/19 0136 01/31/19 0413  WBC 11.5* 11.6* 9.0 8.0 9.9  NEUTROABS 10.1* 10.5* 8.0* 6.9 8.6*  HGB 13.5 13.9 13.1 13.6 14.0  HCT 45.3 47.3* 43.0 45.7 47.3*  MCV 90.2 91.1 89.6 90.3 91.0  PLT 229 237 239 250 2546   Basic Metabolic Panel: Recent Labs  Lab 01/26/19 1329 01/26/19 1521 01/28/19 0852 01/29/19 0309 01/30/19 0136 01/31/19 0413  NA 135  --  134* 133* 137 135  K 4.3  --  4.1 3.9 4.1 4.1  CL 96*  --  93* 94* 95* 92*  CO2 28  --  30 26 34* 34*  GLUCOSE 257*  --  303* 271* 290* 304*  BUN 7  --  11 13 16 17   CREATININE 0.72  --  0.75 0.79 0.71 0.69  CALCIUM 8.3*  --  8.0* 7.9* 7.9* 8.0*  MG  --  1.5* 2.0 1.9 2.3 2.3  PHOS  --  2.8 3.7 3.3 3.3 3.1   GFR: Estimated Creatinine Clearance: 171.8 mL/min (by C-G formula based on SCr of 0.69 mg/dL). Recent Labs  Lab 01/26/19 1521 01/29/19 0309 01/29/19 1650 01/30/19 0136  01/31/19 0413  PROCALCITON <0.10  --   --   --   --   WBC  --  11.6* 9.0 8.0 9.9    Liver Function Tests: Recent Labs  Lab 01/26/19 1521 01/28/19 0852 01/29/19 0309 01/30/19 0136 01/31/19 0413  AST 287* 82* 51* 42* 45*  ALT 156* 109* 83* 69* 58*  ALKPHOS 75 79 70 69 69  BILITOT 0.4 0.5 0.4 0.9 0.9  PROT 8.5* 8.4* 8.2* 8.7* 8.1  ALBUMIN 3.8 3.6 3.5 3.6 3.6   No results for input(s): LIPASE, AMYLASE in the last 168 hours. No results for input(s): AMMONIA in the last 168 hours.  ABG No results found for: PHART, PCO2ART, PO2ART, HCO3, TCO2, ACIDBASEDEF, O2SAT   Coagulation Profile: No results for input(s): INR, PROTIME in the last 168 hours.  Cardiac Enzymes: No results for input(s): CKTOTAL, CKMB, CKMBINDEX, TROPONINI in the last 168 hours.  HbA1C: Hemoglobin A1C  Date/Time Value Ref Range Status  03/02/2015 03:09 PM 6.2  Final   Hgb A1c MFr Bld  Date/Time Value Ref Range Status  01/26/2019 01:30 PM 11.1 (H) 4.8 - 5.6 % Final    Comment:    (NOTE) Pre diabetes:          5.7%-6.4% Diabetes:              >6.4% Glycemic control for   <7.0% adults with diabetes     CBG: Recent Labs  Lab 01/30/19 0743 01/30/19 1150 01/30/19 1646 01/31/19 0727 01/31/19 1152  GLUCAP 322* 276* 268* 290* 290*    Review of Systems:    Positive Symptoms in bold:  Constitutional fevers, chills, weight loss, fatigue, anorexia, malaise  Eyes decreased vision, double vision, eye irritation  Ears, Nose, Mouth, Throat sore throat, trouble swallowing, sinus congestion  Cardiovascular chest pain, paroxysmal nocturnal dyspnea, lower ext edema, palpitations   Respiratory SOB, cough, DOE, hemoptysis, wheezing  Gastrointestinal nausea, vomiting, diarrhea  Genitourinary burning with urination, trouble urinating  Musculoskeletal joint aches, joint swelling, back pain  Integumentary  rashes, skin lesions  Neurological focal weakness, focal numbness, trouble speaking, headaches   Psychiatric depression, anxiety, confusion  Endocrine polyuria, polydipsia, cold intolerance, heat intolerance  Hematologic abnormal bruising, abnormal bleeding, unexplained nose bleeds  Allergic/Immunologic recurrent infections, hives, swollen lymph nodes     Past Medical History  She,  has a past medical history of Back pain, Eczema, Seasonal allergies, Sleep apnea, and Type 2 diabetes mellitus ([place]) (01/26/2019).   Surgical History    Past Surgical History:  Procedure Laterality Date  . NO PAST SURGERIES       Social History   reports that she has never smoked. She has never used smokeless tobacco. She reports that she does not drink alcohol or use drugs.   Family History   Her family history includes Asthma in her brother; Diabetes in her maternal  grandmother; Fibroids in an other family member; Hypertension in her father, maternal grandmother, and paternal grandmother; Migraines in her father.   Allergies Allergies  Allergen Reactions  . Shellfish Allergy Nausea Only     Home Medications  Prior to Admission medications   Medication Sig Start Date End Date Taking? Authorizing Provider  acetaminophen (TYLENOL) 500 MG tablet Take 1,000 mg by mouth every 6 (six) hours as needed for moderate pain or fever.   Yes [provider]  albuterol (VENTOLIN HFA) 108 (90 Base) MCG/ACT inhaler Inhale 2 puffs into the lungs every 6 (six) hours as needed for wheezing or shortness of breath.   Yes [provider]  fluticasone (FLONASE) 50 MCG/ACT nasal spray Place 2 sprays into both nostrils daily. 03/19/18  Yes [place] Bing, DO  Phenyleph-Diphenhyd-DM-APAP ([employer] SEVERE COLD/FLU) Liquid LQPK Take 15 mLs by mouth 2 (two) times daily as needed (cold/flu symptoms).    Yes [provider]  triamcinolone ointment (KENALOG) 0.1 % Apply 1 application topically 2 (two) times daily. Patient taking differently: Apply 1 application topically daily.  03/19/18  Yes [place] Bing, DO  famotidine (PEPCID) 20 MG tablet Take 1 tablet (20 mg total) by mouth 2 (two) times daily. Patient not taking: Reported on 04/16/2018 10/21/16   Charlesetta Shanks, MD  ibuprofen (ADVIL) 800 MG tablet Take 1 tablet (800 mg total) by mouth 3 (three) times daily. Patient not taking: Reported on 01/26/2019 11/28/18   Palumbo, April, MD  metFORMIN (GLUCOPHAGE) 500 MG tablet Take 1 tablet (500 mg total) by mouth daily with breakfast. Patient not taking: Reported on 01/26/2019 05/05/18   Drenda Freeze, MD     Critical care time: 32 minutes not including any separately billable procedures

## 2019-01-31 NOTE — Progress Notes (Signed)
PROGRESS NOTE  Anita Hunter SPQ:[id number] DOB: 07-27-1990   PCP: [name], MD  Patient is from: Home  DOA: 01/26/2019 LOS: 5  Brief Narrative / Interim history: 28 y.o.femalewith medical history significant ofchronic back pain, morbid obesity, eczema, seasonal allergies, OSA not on CPAP, DM-2 who is coming to the emergency department due to progressively worse shortness of breathfor the past 2 days associated with sinus congestion, fatigue, malaise, low-grade temperatures and chills. In ED, tachycardic and slightly tachypneic.  BP elevated.  90% on room air and improved to mid 90s on 2 L by [place].  CBC normal.  Inflammatory markers and LFT elevated.  Pro-Cal negative.  CXR without acute finding.  Admitted and started on Solu-Medrol 12/14 - Remdesivir added the following day 12/15.    Subjective: Overnight patient became somewhat anxious with increasing respiratory rate and now hypoxic requiring both nonrebreather and 15L high flow nasal cannula.  Patient will be transferred to the ICU for closer monitoring and care.  Likely transition patient to heated high flow should she remain hypoxic although currently 91% on current oxygen supplementation proning on exam and appears to be resting comfortably.  Assessment & Plan: Principal Problem:   Pneumonia due to COVID-19 virus Active Problems:   Morbid obesity with BMI of 50.0-59.9, adult (HCC)   Obstructive sleep apnea   Type 2 diabetes mellitus (HCC)   Hypomagnesemia   NASH (nonalcoholic steatohepatitis)   Acute respiratory failure with hypoxia due to COVID-19 pneumonia:  SpO2: (!) 82 % O2 Flow Rate (L/min): 40 L/min FiO2 (%): 100 % Patient has severe disease, worsening over the past 24 hours  Transition to ICU for further oxygen supplementation Currently remains on nonrebreather with sats in the mid/high 80s but appears to be in no acute distress PCCM following, agree with intubation as a last resort given patient's body  habitus and history would likely do very poorly on ventilator Chest x-ray 12/19 personally reviewed, worsening bilateral opacification, concerning for diffuse pulmonary infiltrate and edema -near bilateral white out compared to chest x-ray on 01/29/2019 Recent Labs    01/29/19 0309 01/30/19 0136 01/31/19 0413  DDIMER 0.32 0.37 0.41  FERRITIN 238 230 222  CRP 6.6* 8.4* 4.2*  -Remdesivir 12/16>>12/20 -Actemra 01/29/19 x1 consider 2nd dose if CRP continues to worsen (although now downtrending after peak yesterday) -Continue incentive spiro, flutter, proning -Continue steroids, inhalers, mucolytic's, antitussive as necessary  Elevated liver enzymes:  Hepatic Function Latest Ref Rng & Units 01/31/2019 01/30/2019 01/29/2019  Total Protein 6.5 - 8.1 g/dL 8.1 8.7(H) 8.2(H)  Albumin 3.5 - 5.0 g/dL 3.6 3.6 3.5  AST 15 - 41 U/L 45(H) 42(H) 51(H)  ALT 0 - 44 U/L 58(H) 69(H) 83(H)  Alk Phosphatase 38 - 126 U/L 69 69 70  Total Bilirubin 0.3 - 1.2 mg/dL 0.9 0.9 0.4  Bilirubin, Direct 0.0 - 0.2 mg/dL - - -  -Likely 2/2 covid, dehydration and poor PO intake -Continue to monitor closely given remdesivir administration (stop date 02/01/2019)  Uncontrolled diabetes type 2 with hyperglycemia:  A1c 11.1%.  Only on Metformin at home. Recent Labs    01/29/19 1208 01/29/19 1619 01/29/19 2022  GLUCAP 272* 321* 359*  -Continue Levemir at 30 units twice daily - may drop patient to half dose or hold given decreased p.o. intake over the past 24 hours -Continue sliding scale - aggressive dosing -Increase NovoLog 4 to 6 units AC -Continue statin and Tradjenta.  OSA/morbid obesity:  -BMI 53.4.   -CPAP settings previously recorded at  24/19 -patient poorly compliant, cost issues as well - would likely benefit from repeat sleep study in the outpatient setting -Discussed dietary and lifestyle changes  Hypomagnesemia:  -Resolved.  Nonalcoholic hepatic steatosis -Encourage lifestyle change to lose  weight. -Outpatient follow-up with PCP  Mild hyponatremia: Stable -Continue monitoring.  Leukemoid reaction likely in the setting of steroids as above, resolving -Continue monitoring   DVT prophylaxis: Subcu Lovenox Code Status: Full code Family Communication: Updated patient's mother over the phone. Disposition Plan: Patient remains in critical condition currently in the ICU requiring close monitoring, high flow oxygen with worsening respiratory status.  Patient will likely remain inpatient until resolution of respiratory status, Covid pneumonia.  Consultants: None   Objective: Vitals:   01/31/19 1112 01/31/19 1200 01/31/19 1300 01/31/19 1400  BP: 140/79 131/83 140/86 (!) 139/92  Pulse: (!) 106 93 95 100  Resp: (!) 34 (!) 28 (!) 31 (!) 48  Temp:      TempSrc:      SpO2: 93% 93% 91% (!) 82%  Weight:      Height:        Intake/Output Summary (Last 24 hours) at 01/31/2019 1732 Last data filed at 01/31/2019 1432 Gross per 24 hour  Intake 60 ml  Output --  Net 60 ml   Filed Weights   01/26/19 2050 01/31/19 0900  Weight: (!) 164 kg (!) 158.3 kg    Examination:  GENERAL: Obese female proning in bed, somewhat diaphoretic, mild respiratory distress and somewhat anxious appearing HEENT: MMM.  Vision and hearing grossly intact.  NECK: Supple.  No apparent JVD.  RESP: Diminished breath sounds bilaterally, scant bibasilar wheeze at end expiration without overt rales.   CVS:  RRR. Heart sounds normal.  ABD/GI/GU: Bowel sounds present. Soft. Non tender.  MSK/EXT:  Moves extremities. No apparent deformity or edema.  SKIN: no apparent skin lesion or wound NEURO: Somewhat somnolent, easily arousable, alert oriented x4 without focal deficit PSYCH: Anxious  Procedures:  None  Microbiology summarized: POC COVID-19 positive.  Sch Meds:  Scheduled Meds: . chlorhexidine  15 mL Mouth Rinse BID  . Chlorhexidine Gluconate Cloth  6 each Topical Daily  . enoxaparin (LOVENOX)  injection  80 mg Subcutaneous Q12H  . famotidine  20 mg Oral BID  . furosemide  40 mg Intravenous Q8H  . insulin aspart  0-20 Units Subcutaneous TID WC  . insulin aspart  0-5 Units Subcutaneous QHS  . insulin aspart  6 Units Subcutaneous TID WC  . insulin detemir  40 Units Subcutaneous BID  . insulin starter kit- pen needles  1 kit Other Once  . Ipratropium-Albuterol  2 puff Inhalation Q6H WA  . linagliptin  5 mg Oral Daily  . mouth rinse  15 mL Mouth Rinse q12n4p  . methylPREDNISolone (SOLU-MEDROL) injection  60 mg Intravenous Q12H  . mometasone-formoterol  2 puff Inhalation BID  . sodium chloride flush  10-40 mL Intracatheter Q12H  . ursodiol  300 mg Oral TID   Continuous Infusions: . sodium chloride    . dexmedetomidine (PRECEDEX) IV infusion    . remdesivir 100 mg in NS 100 mL 100 mg (01/31/19 1019)   PRN Meds:.sodium chloride, acetaminophen **OR** acetaminophen, benzonatate, chlorpheniramine-HYDROcodone, LORazepam, prochlorperazine, sodium chloride, sodium chloride flush  Antimicrobials: Anti-infectives (From admission, onward)   Start     Dose/Rate Route Frequency Ordered Stop   01/29/19 1000  remdesivir 100 mg in sodium chloride 0.9 % 100 mL IVPB     100 mg 200 mL/hr over 30  Minutes Intravenous Daily 01/28/19 0732 02/02/19 0959   01/28/19 1000  remdesivir 200 mg in sodium chloride 0.9% 250 mL IVPB     200 mg 580 mL/hr over 30 Minutes Intravenous Once 01/28/19 0732 01/28/19 1005     I have personally reviewed the following labs and images: CBC: Recent Labs  Lab 01/28/19 0852 01/29/19 0309 01/29/19 1650 01/30/19 0136 01/31/19 0413  WBC 11.5* 11.6* 9.0 8.0 9.9  NEUTROABS 10.1* 10.5* 8.0* 6.9 8.6*  HGB 13.5 13.9 13.1 13.6 14.0  HCT 45.3 47.3* 43.0 45.7 47.3*  MCV 90.2 91.1 89.6 90.3 91.0  PLT 229 237 239 250 295   BMP &GFR Recent Labs  Lab 01/26/19 1329 01/26/19 1521 01/28/19 0852 01/29/19 0309 01/30/19 0136 01/31/19 0413  NA 135  --  134* 133* 137 135   K 4.3  --  4.1 3.9 4.1 4.1  CL 96*  --  93* 94* 95* 92*  CO2 28  --  30 26 34* 34*  GLUCOSE 257*  --  303* 271* 290* 304*  BUN 7  --  _0 CREATININE 0.72  --  0.75 0.79 0.71 0.69  CALCIUM 8.3*  --  8.0* 7.9* 7.9* 8.0*  MG  --  1.5* 2.0 1.9 2.3 2.3  PHOS  --  2.8 3.7 3.3 3.3 3.1   Estimated Creatinine Clearance: 171.8 mL/min (by C-G formula based on SCr of 0.69 mg/dL). Liver & Pancreas: Recent Labs  Lab 01/26/19 1521 01/28/19 0852 01/29/19 0309 01/30/19 0136 01/31/19 0413  AST 287* 82* 51* 42* 45*  ALT 156* 109* 83* 69* 58*  ALKPHOS 75 79 70 69 69  BILITOT 0.4 0.5 0.4 0.9 0.9  PROT 8.5* 8.4* 8.2* 8.7* 8.1  ALBUMIN 3.8 3.6 3.5 3.6 3.6   No results for input(s): LIPASE, AMYLASE in the last 168 hours. No results for input(s): AMMONIA in the last 168 hours. Diabetic: No results for input(s): HGBA1C in the last 72 hours. Recent Labs  Lab 01/30/19 0743 01/30/19 1150 01/30/19 1646 01/31/19 0727 01/31/19 1152  GLUCAP 322* 276* 268* 290* 290*   Cardiac Enzymes: No results for input(s): CKTOTAL, CKMB, CKMBINDEX, TROPONINI in the last 168 hours. No results for input(s): PROBNP in the last 8760 hours. Coagulation Profile: No results for input(s): INR, PROTIME in the last 168 hours. Thyroid Function Tests: No results for input(s): TSH, T4TOTAL, FREET4, T3FREE, THYROIDAB in the last 72 hours. Lipid Profile: No results for input(s): CHOL, HDL, LDLCALC, TRIG, CHOLHDL, LDLDIRECT in the last 72 hours. Anemia Panel: Recent Labs    01/30/19 0136 01/31/19 0413  FERRITIN 230 222   Urine analysis:    Component Value Date/Time   COLORURINE YELLOW 11/27/2018 2358   APPEARANCEUR CLOUDY (A) 11/27/2018 2358   LABSPEC 1.025 11/27/2018 2358   PHURINE 6.0 11/27/2018 2358   GLUCOSEU NEGATIVE 11/27/2018 2358   HGBUR NEGATIVE 11/27/2018 2358   BILIRUBINUR NEGATIVE 11/27/2018 2358   KETONESUR 15 (A) 11/27/2018 2358   PROTEINUR NEGATIVE 11/27/2018 2358   NITRITE NEGATIVE  11/27/2018 2358   LEUKOCYTESUR NEGATIVE 11/27/2018 2358   Microbiology: No results found for this or any previous visit (from the past 240 hour(s)).  Radiology Studies: DG Chest 1 View  Result Date: 01/31/2019 CLINICAL DATA:  Respiratory distress syndrome EXAM: CHEST  1 VIEW COMPARISON:  01/29/2019 FINDINGS: Near complete whiteout of the thorax likely representing progression of bilateral airspace opacities and underpenetration of the study secondary to patient body habitus. Cardiomediastinal silhouette is obscured. IMPRESSION: Near  complete whiteout of the thorax, likely representing progression of bilateral airspace opacities and underpenetration of the study secondary to patient body habitus. Electronically Signed   By: Davina Poke M.D.   On: 01/31/2019 14:02   [place] EKG SITE RITE  Result Date: 01/31/2019 If Site Rite image not attached, placement could not be confirmed due to current cardiac rhythm.   Holli Humbles DO [facility]  If 7PM-7AM, please contact night-coverage [email or web address] Password [facility] 01/31/2019, 5:32 PM

## 2019-01-31 NOTE — Progress Notes (Signed)
Peripherally Inserted Central Catheter/Midline Placement  The IV Nurse has discussed with the patient and/or persons authorized to consent for the patient, the purpose of this procedure and the potential benefits and risks involved with this procedure.  The benefits include less needle sticks, lab draws from the catheter, and the patient may be discharged home with the catheter. Risks include, but not limited to, infection, bleeding, blood clot (thrombus formation), and puncture of an artery; nerve damage and irregular heartbeat and possibility to perform a PICC exchange if needed/ordered by physician.  Alternatives to this procedure were also discussed.  Bard Power PICC patient education guide, fact sheet on infection prevention and patient information card has been provided to patient /or left at bedside.    PICC/Midline Placement Documentation  PICC Double Lumen 01/31/19 PICC Right Cephalic 44 cm 2 cm (Active)  Indication for Insertion or Continuance of Line Poor Vasculature-patient has had multiple peripheral attempts or PIVs lasting less than 24 hours 01/31/19 1640  Exposed Catheter (cm) 2 cm 01/31/19 1640  Site Assessment Clean;Intact;Dry 01/31/19 1640  Lumen #1 Status Flushed;Saline locked;Blood return noted 01/31/19 1640  Lumen #2 Status Flushed;Saline locked;Blood return noted 01/31/19 1640  Dressing Type Transparent 01/31/19 1640  Dressing Status Clean;Dry;Intact;Antimicrobial disc in place 01/31/19 1640  Dressing Change Due 02/07/19 01/31/19 1640       Gordan Payment 01/31/2019, 4:42 PM

## 2019-01-31 NOTE — Progress Notes (Signed)
Rapid Response Event Note  Overview: Requested to assess patient for possible escalation in care.       Initial Focused Assessment: Patient proned with on 15L NRB + 15L HFNC. Oxygen saturation in low 90s. Patient denies feeling short of breath, but respirations remained in the 40s during assessment. Lung sounds diminished with inspiratory wheezing. Confirmed transfer to ICU for heated high flow oxygen with Dr. Avon Gully.   Interventions: Transfer to ICU  Plan of Care (if not transferred): Transfer to ICU.      Elspeth Cho

## 2019-01-31 NOTE — Progress Notes (Signed)
Pt being emergently intubated. Family contacted. Pt has failed BIPAP. CRNA and anesthesiologist and 2 RT's at bedside.

## 2019-01-31 NOTE — Progress Notes (Signed)
Placed a phone call to patients mother . Updated her on bipap and that patient is resting comfortably. I also spoke to her mom about placing a PICC and that if patient is too lethargic to give consent theiIV team may call her. pts mother very tearful and appreciative of call. I told her I would call her back in few hours.I placed phone to patient ear so she could speak to her daughter.

## 2019-01-31 NOTE — Progress Notes (Signed)
Pt. Is proned, 15L HF, 15 L NR, sats 89/90's% . MD aware of it. Will monitor closely.

## 2019-01-31 NOTE — Progress Notes (Signed)
ICU CN called and updated Deaunna, Olarte mother to update about the course of events this morning. All question were welcomed and answered. Unit procedures and contact information were reviewed and read back by Mardene Celeste. Video chat was requested at 8:00pm this evening.

## 2019-01-31 NOTE — Progress Notes (Addendum)
[place] Progress Note Patient Name: Anita Hunter DOB: [birth year] MRN: [id number]   Date of Service  01/31/2019  HPI/Events of Note  Respiratory Failure - Sat = 71%  And RR = 50. Patient confused and pulling BiPAP mask off. Anesthesia paged to intubate the patient.   eICU Interventions  Will order: 1. Ventilator settings: 100%/PRVC 24/TV 400/P 15 2. ABG 1 hour post intubation and ventilation. 3. Portable CXR post intubation. 4. Propofol and Fentanyl IV infusions. Titrate to RASS = 0 to -1.      Intervention Category Major Interventions: Respiratory failure - evaluation and management  Anita Hunter 01/31/2019, 11:14 PM

## 2019-01-31 NOTE — Anesthesia Procedure Notes (Signed)
Procedure Name: Intubation Date/Time: 01/31/2019 11:29 PM Performed by: Janace Litten, CRNA Pre-anesthesia Checklist: Patient identified, Emergency Drugs available, Suction available and Patient being monitored Patient Re-evaluated:Patient Re-evaluated prior to induction Oxygen Delivery Method: Ambu bag Preoxygenation: Pre-oxygenation with 100% oxygen Induction Type: IV induction and Rapid sequence Laryngoscope Size: Mac and 4 Grade View: Grade I Tube type: Oral Tube size: 7.5 mm Number of attempts: 1 Airway Equipment and Method: Video-laryngoscopy Placement Confirmation: ETT inserted through vocal cords under direct vision and CO2 detector Tube secured with: Tape Dental Injury: Teeth and Oropharynx as per pre-operative assessment

## 2019-01-31 NOTE — Progress Notes (Signed)
At start of shift, pt was lying with eyes closed on NRB/HHFNC with O2 90%, however when she awakened desaturated with talking and movement and frequently removed cannula d/t discomfort. Placed on nightly BiPAP by RT, however pt was not tolerating, with anxiety and frequent removal of the mask. Ativan given with no effect, pt requested to be placed back on NRB/HHFNC. Increased WOB, only able to speak in short sentences. Dr. Francie Massing in to assess, recommended proning and initiating Precedex. Post proning, pt appeared to decompensate further, tachypneic to the 50s, O2 70s. Gave patient several minutes to recover, however seemingly no improvement. Dr. Francie Massing back to assess, instructed to supinate and place on BiPAP. Paradoxical breathing, accessory muscle use, RR 50s, minimal improvement in oxygen. Elink camera utilized, assessed by Dr. Oletta Darter who ordered intubation.  Post intubation, pt was hard to ventilate with frequent coughing. Sp02 as low as 30%, manually bagged with slow, very minimal improvement. Could not get O2 above 50s, Dr. Oletta Darter called and orders for vec received. Pt given 31m versed beforehand, in addition to already infusing propofol/fentanyl. Improvement to the 80s after several minutes.   Pt is currently hemodynamically stable, on 100% FiO2 and 18 PEEP with Spo2 > 88%. Chemically paralyzed and sedated to RASS -5. Proned per Elink.   After stabilizing, called family and provided update. They asked appropriate questions, and I informed them that they will receive a call from the MD tomorrow.

## 2019-01-31 NOTE — Progress Notes (Addendum)
Pt received into ICU 207-1. Pt is proned, currently on hi flo oxygen as well as nrb. Pt is very scared. RN at bedside providing emotional support, asked pt what music she likes. Pt is listening to her phone and resting. Sats 93%. Pt is tachypneic, closely monitor. Pts mother is to be called by charge nurse and updated so nurse may stay at bedside with patient.   Pts belongings inventoried: 1 jacket, 1 pair of pants, 1 shirt, 1 pair of tennis shoes, one cell phone and one charger.   Ellamae Sia  I also found a pair of keys, wallet with $5 and 3 credit cards and drivers license. These all were sent to security to put in the safe. Ellamae Sia

## 2019-01-31 NOTE — Progress Notes (Signed)
pts mother called for update .Updated and facetimed with her . Anita Hunter

## 2019-01-31 NOTE — Progress Notes (Signed)
Called patients mom and let her speak to patient briefly while patient is off bipap. Pt wanted bipap off at 1300, transitioned to hi flo and has been maintaining sats.. Continue to closely monitor. Ellamae Sia

## 2019-02-01 ENCOUNTER — Inpatient Hospital Stay (HOSPITAL_COMMUNITY): Payer: BC Managed Care – PPO

## 2019-02-01 ENCOUNTER — Encounter (HOSPITAL_COMMUNITY): Payer: BC Managed Care – PPO

## 2019-02-01 LAB — POCT I-STAT 7, (LYTES, BLD GAS, ICA,H+H)
Acid-Base Excess: 9 mmol/L — ABNORMAL HIGH (ref 0.0–2.0)
Acid-Base Excess: 12 mmol/L — ABNORMAL HIGH (ref 0.0–2.0)
Acid-Base Excess: 16 mmol/L — ABNORMAL HIGH (ref 0.0–2.0)
Bicarbonate: 41.1 mmol/L — ABNORMAL HIGH (ref 20.0–28.0)
Bicarbonate: 42.3 mmol/L — ABNORMAL HIGH (ref 20.0–28.0)
Bicarbonate: 44.2 mmol/L — ABNORMAL HIGH (ref 20.0–28.0)
Calcium, Ion: 0.99 mmol/L — ABNORMAL LOW (ref 1.15–1.40)
Calcium, Ion: 0.93 mmol/L — ABNORMAL LOW (ref 1.15–1.40)
Calcium, Ion: 0.92 mmol/L — ABNORMAL LOW (ref 1.15–1.40)
HCT: 45 % (ref 36.0–46.0)
HCT: 43 % (ref 36.0–46.0)
HCT: 40 % (ref 36.0–46.0)
Hemoglobin: 15.3 g/dL — ABNORMAL HIGH (ref 12.0–15.0)
Hemoglobin: 14.6 g/dL (ref 12.0–15.0)
Hemoglobin: 13.6 g/dL (ref 12.0–15.0)
O2 Saturation: 86 %
O2 Saturation: 99 %
O2 Saturation: 95 %
Patient temperature: 98.6
Patient temperature: 98.6
Patient temperature: 98.6
Potassium: 4.3 mmol/L (ref 3.5–5.1)
Potassium: 4.3 mmol/L (ref 3.5–5.1)
Potassium: 3.9 mmol/L (ref 3.5–5.1)
Sodium: 134 mmol/L — ABNORMAL LOW (ref 135–145)
Sodium: 135 mmol/L (ref 135–145)
Sodium: 134 mmol/L — ABNORMAL LOW (ref 135–145)
TCO2: 44 mmol/L — ABNORMAL HIGH (ref 22–32)
TCO2: 45 mmol/L — ABNORMAL HIGH (ref 22–32)
TCO2: 46 mmol/L — ABNORMAL HIGH (ref 22–32)
pCO2 arterial: 93.2 mmHg (ref 32.0–48.0)
pCO2 arterial: 84.6 mmHg (ref 32.0–48.0)
pCO2 arterial: 71.7 mmHg (ref 32.0–48.0)
pH, Arterial: 7.252 — ABNORMAL LOW (ref 7.350–7.450)
pH, Arterial: 7.307 — ABNORMAL LOW (ref 7.350–7.450)
pH, Arterial: 7.398 (ref 7.350–7.450)
pO2, Arterial: 64 mmHg — ABNORMAL LOW (ref 83.0–108.0)
pO2, Arterial: 167 mmHg — ABNORMAL HIGH (ref 83.0–108.0)
pO2, Arterial: 82 mmHg — ABNORMAL LOW (ref 83.0–108.0)

## 2019-02-01 LAB — BASIC METABOLIC PANEL
Anion gap: 10 (ref 5–15)
BUN: 25 mg/dL — ABNORMAL HIGH (ref 6–20)
CO2: 37 mmol/L — ABNORMAL HIGH (ref 22–32)
Calcium: 7.1 mg/dL — ABNORMAL LOW (ref 8.9–10.3)
Chloride: 89 mmol/L — ABNORMAL LOW (ref 98–111)
Creatinine, Ser: 1.07 mg/dL — ABNORMAL HIGH (ref 0.44–1.00)
GFR calc Af Amer: >60 mL/min (ref >60)
GFR calc non Af Amer: >60 mL/min (ref >60)
Glucose, Bld: 316 mg/dL — ABNORMAL HIGH (ref 70–99)
Potassium: 4.4 mmol/L (ref 3.5–5.1)
Sodium: 136 mmol/L (ref 135–145)

## 2019-02-01 LAB — CBC
HCT: 45.3 % (ref 36.0–46.0)
Hemoglobin: 13.2 g/dL (ref 12.0–15.0)
MCH: 26.7 pg (ref 26.0–34.0)
MCHC: 29.1 g/dL — ABNORMAL LOW (ref 30.0–36.0)
MCV: 91.5 fL (ref 80.0–100.0)
Platelets: 339 10*3/uL (ref 150–400)
RBC: 4.95 MIL/uL (ref 3.87–5.11)
RDW: 12.8 % (ref 11.5–15.5)
WBC: 12.7 10*3/uL — ABNORMAL HIGH (ref 4.0–10.5)
nRBC: 0 % (ref 0.0–0.2)

## 2019-02-01 LAB — GLUCOSE, CAPILLARY
Glucose-Capillary: 276 mg/dL — ABNORMAL HIGH (ref 70–99)
Glucose-Capillary: 296 mg/dL — ABNORMAL HIGH (ref 70–99)
Glucose-Capillary: 270 mg/dL — ABNORMAL HIGH (ref 70–99)
Glucose-Capillary: 243 mg/dL — ABNORMAL HIGH (ref 70–99)
Glucose-Capillary: 244 mg/dL — ABNORMAL HIGH (ref 70–99)

## 2019-02-01 LAB — MAGNESIUM: Magnesium: 2.4 mg/dL (ref 1.7–2.4)

## 2019-02-01 LAB — TRIGLYCERIDES: Triglycerides: 474 mg/dL — ABNORMAL HIGH (ref <150)

## 2019-02-01 LAB — MRSA PCR SCREENING: MRSA by PCR: POSITIVE — AB

## 2019-02-01 MED ORDER — SODIUM CHLORIDE 0.9 % IV SOLN
0.6000 mg/kg/h | INTRAVENOUS | Status: DC
  Administered 2019-02-01: 1 mg/kg/h via INTRAVENOUS
  Filled 2019-02-01 (×2): qty 5

## 2019-02-01 MED ORDER — VECURONIUM BROMIDE 10 MG IV SOLR
0.0000 ug/kg/min | INTRAVENOUS | Status: DC
  Administered 2019-02-01: 1 ug/kg/min via INTRAVENOUS
  Administered 2019-02-02: 02:00:00 0.3 ug/kg/min via INTRAVENOUS
  Filled 2019-02-01: qty 100
  Filled 2019-02-01: qty 80
  Filled 2019-02-01: qty 100

## 2019-02-01 MED ORDER — PHENYLEPHRINE CONCENTRATED 100MG/250ML (0.4 MG/ML) INFUSION SIMPLE
0.0000 ug/min | INTRAVENOUS | Status: DC
  Administered 2019-02-01: 10 ug/min via INTRAVENOUS
  Filled 2019-02-01: qty 250

## 2019-02-01 MED ORDER — FENTANYL 2500MCG IN NS 250ML (10MCG/ML) PREMIX INFUSION
50.0000 ug/h | INTRAVENOUS | Status: DC
  Administered 2019-02-01: 400 ug/h via INTRAVENOUS
  Administered 2019-02-01 (×2): 300 ug/h via INTRAVENOUS
  Administered 2019-02-02: 04:00:00 400 ug/h via INTRAVENOUS
  Filled 2019-02-01 (×5): qty 250

## 2019-02-01 MED ORDER — SODIUM CHLORIDE 0.9 % IV SOLN: 1.0000 mg/kg/h | INTRAVENOUS | Status: DC

## 2019-02-01 MED ORDER — CLONAZEPAM 1 MG PO TABS
2.0000 mg | ORAL_TABLET | Freq: Two times a day (BID) | ORAL | Status: DC
  Administered 2019-02-02 – 2019-02-11 (×18): 2 mg
  Filled 2019-02-01 (×17): qty 2

## 2019-02-01 MED ORDER — ORAL CARE MOUTH RINSE
15.0000 mL | OROMUCOSAL | Status: DC
  Administered 2019-02-01 – 2019-02-11 (×107): 15 mL via OROMUCOSAL

## 2019-02-01 MED ORDER — INSULIN ASPART 100 UNIT/ML ~~LOC~~ SOLN
0.0000 [IU] | SUBCUTANEOUS | Status: DC
  Administered 2019-02-01: 11 [IU] via SUBCUTANEOUS
  Administered 2019-02-01 (×2): 7 [IU] via SUBCUTANEOUS
  Administered 2019-02-01: 11 [IU] via SUBCUTANEOUS
  Administered 2019-02-02: 4 [IU] via SUBCUTANEOUS
  Administered 2019-02-02: 11 [IU] via SUBCUTANEOUS
  Administered 2019-02-02 (×4): 7 [IU] via SUBCUTANEOUS
  Administered 2019-02-03: 15 [IU] via SUBCUTANEOUS
  Administered 2019-02-03 (×2): 4 [IU] via SUBCUTANEOUS
  Administered 2019-02-03: 7 [IU] via SUBCUTANEOUS
  Administered 2019-02-03: 4 [IU] via SUBCUTANEOUS
  Administered 2019-02-04 (×4): 7 [IU] via SUBCUTANEOUS
  Administered 2019-02-04: 3 [IU] via SUBCUTANEOUS
  Administered 2019-02-04 (×2): 7 [IU] via SUBCUTANEOUS
  Administered 2019-02-05: 3 [IU] via SUBCUTANEOUS
  Administered 2019-02-05: 11 [IU] via SUBCUTANEOUS
  Administered 2019-02-05: 4 [IU] via SUBCUTANEOUS
  Administered 2019-02-05: 7 [IU] via SUBCUTANEOUS
  Administered 2019-02-05 (×2): 4 [IU] via SUBCUTANEOUS
  Administered 2019-02-06: 13:00:00 3 [IU] via SUBCUTANEOUS
  Administered 2019-02-06: 4 [IU] via SUBCUTANEOUS
  Administered 2019-02-07: 7 [IU] via SUBCUTANEOUS
  Administered 2019-02-07: 4 [IU] via SUBCUTANEOUS
  Administered 2019-02-07: 3 [IU] via SUBCUTANEOUS
  Administered 2019-02-07: 15 [IU] via SUBCUTANEOUS
  Administered 2019-02-07: 4 [IU] via SUBCUTANEOUS
  Administered 2019-02-07 (×2): 7 [IU] via SUBCUTANEOUS
  Administered 2019-02-08: 15 [IU] via SUBCUTANEOUS
  Administered 2019-02-08 (×2): 4 [IU] via SUBCUTANEOUS
  Administered 2019-02-08: 3 [IU] via SUBCUTANEOUS
  Administered 2019-02-08: 7 [IU] via SUBCUTANEOUS
  Administered 2019-02-08 – 2019-02-09 (×4): 4 [IU] via SUBCUTANEOUS
  Administered 2019-02-09: 7 [IU] via SUBCUTANEOUS
  Administered 2019-02-09: 0.3 [IU] via SUBCUTANEOUS
  Administered 2019-02-10 (×2): 4 [IU] via SUBCUTANEOUS
  Administered 2019-02-10: 7 [IU] via SUBCUTANEOUS
  Administered 2019-02-10: 4 [IU] via SUBCUTANEOUS
  Administered 2019-02-10: 3 [IU] via SUBCUTANEOUS
  Administered 2019-02-10: 4 [IU] via SUBCUTANEOUS
  Administered 2019-02-11: 3 [IU] via SUBCUTANEOUS
  Administered 2019-02-11: 4 [IU] via SUBCUTANEOUS
  Administered 2019-02-11: 3 [IU] via SUBCUTANEOUS
  Administered 2019-02-11: 11 [IU] via SUBCUTANEOUS
  Administered 2019-02-11: 3 [IU] via SUBCUTANEOUS
  Administered 2019-02-11: 7 [IU] via SUBCUTANEOUS
  Administered 2019-02-11: 4 [IU] via SUBCUTANEOUS
  Administered 2019-02-12: 3 [IU] via SUBCUTANEOUS
  Administered 2019-02-12 (×5): 4 [IU] via SUBCUTANEOUS
  Administered 2019-02-13: 7 [IU] via SUBCUTANEOUS
  Administered 2019-02-13: 11 [IU] via SUBCUTANEOUS
  Administered 2019-02-13: 7 [IU] via SUBCUTANEOUS
  Administered 2019-02-13 – 2019-02-14 (×4): 4 [IU] via SUBCUTANEOUS
  Administered 2019-02-14: 7 [IU] via SUBCUTANEOUS
  Administered 2019-02-14 (×3): 4 [IU] via SUBCUTANEOUS
  Administered 2019-02-15: 1 [IU] via SUBCUTANEOUS
  Administered 2019-02-15 (×2): 3 [IU] via SUBCUTANEOUS
  Administered 2019-02-15: 4 [IU] via SUBCUTANEOUS
  Administered 2019-02-15: 3 [IU] via SUBCUTANEOUS
  Administered 2019-02-16: 4 [IU] via SUBCUTANEOUS
  Administered 2019-02-16 – 2019-02-17 (×4): 3 [IU] via SUBCUTANEOUS
  Administered 2019-02-17: 7 [IU] via SUBCUTANEOUS
  Administered 2019-02-17: 4 [IU] via SUBCUTANEOUS
  Administered 2019-02-17 – 2019-02-18 (×4): 3 [IU] via SUBCUTANEOUS
  Administered 2019-02-18: 4 [IU] via SUBCUTANEOUS
  Administered 2019-02-19 – 2019-02-20 (×3): 3 [IU] via SUBCUTANEOUS

## 2019-02-01 MED ORDER — MIDAZOLAM HCL 2 MG/2ML IJ SOLN
4.0000 mg | Freq: Once | INTRAMUSCULAR | Status: AC
  Administered 2019-02-01: 4 mg via INTRAVENOUS

## 2019-02-01 MED ORDER — STERILE WATER FOR INJECTION IJ SOLN
INTRAMUSCULAR | Status: AC
  Filled 2019-02-01: qty 10

## 2019-02-01 MED ORDER — SODIUM CHLORIDE 0.9 % IV SOLN
1.0000 mg/kg/h | INTRAVENOUS | Status: DC
  Filled 2019-02-01 (×2): qty 12.5

## 2019-02-01 MED ORDER — PROPOFOL 1000 MG/100ML IV EMUL
25.0000 ug/kg/min | INTRAVENOUS | Status: DC
  Administered 2019-02-01: 80 ug/kg/min via INTRAVENOUS
  Administered 2019-02-01: 70 ug/kg/min via INTRAVENOUS
  Administered 2019-02-01: 80 ug/kg/min via INTRAVENOUS
  Administered 2019-02-01: 70 ug/kg/min via INTRAVENOUS
  Administered 2019-02-01 (×3): 80 ug/kg/min via INTRAVENOUS
  Administered 2019-02-01: 70 ug/kg/min via INTRAVENOUS
  Administered 2019-02-01 (×4): 80 ug/kg/min via INTRAVENOUS
  Administered 2019-02-01: 35 ug/kg/min via INTRAVENOUS
  Administered 2019-02-01: 80 ug/kg/min via INTRAVENOUS
  Administered 2019-02-01: 35 ug/kg/min via INTRAVENOUS
  Administered 2019-02-02 (×6): 80 ug/kg/min via INTRAVENOUS
  Filled 2019-02-01: qty 100
  Filled 2019-02-01: qty 200
  Filled 2019-02-01 (×3): qty 100
  Filled 2019-02-01: qty 200
  Filled 2019-02-01: qty 100
  Filled 2019-02-01 (×3): qty 200
  Filled 2019-02-01: qty 100
  Filled 2019-02-01: qty 200
  Filled 2019-02-01: qty 100
  Filled 2019-02-01: qty 200

## 2019-02-01 MED ORDER — SODIUM CHLORIDE 0.9 % IV SOLN
1.0000 mg/kg/h | INTRAVENOUS | Status: DC
  Administered 2019-02-01 – 2019-02-09 (×11): 1 mg/kg/h via INTRAVENOUS
  Filled 2019-02-01 (×8): qty 25
  Filled 2019-02-01: qty 5
  Filled 2019-02-01 (×12): qty 25

## 2019-02-01 MED ORDER — DEXAMETHASONE SODIUM PHOSPHATE 10 MG/ML IJ SOLN
6.0000 mg | Freq: Every day | INTRAMUSCULAR | Status: AC
  Administered 2019-02-01 – 2019-02-09 (×9): 6 mg via INTRAVENOUS
  Filled 2019-02-01 (×9): qty 1

## 2019-02-01 MED ORDER — MIDAZOLAM HCL 2 MG/2ML IJ SOLN
INTRAMUSCULAR | Status: AC
  Filled 2019-02-01: qty 4

## 2019-02-01 MED ORDER — MAGNESIUM HYDROXIDE 400 MG/5ML PO SUSP
15.0000 mL | Freq: Every day | ORAL | Status: DC
  Administered 2019-02-01 – 2019-02-06 (×6): 15 mL via ORAL
  Filled 2019-02-01 (×6): qty 30

## 2019-02-01 MED ORDER — FENTANYL CITRATE (PF) 100 MCG/2ML IJ SOLN: 50.0000 ug | Freq: Once | INTRAMUSCULAR | Status: DC

## 2019-02-01 MED ORDER — VECURONIUM BROMIDE 10 MG IV SOLR
0.1000 mg/kg | Freq: Once | INTRAVENOUS | Status: AC
  Administered 2019-02-01: 15.8 mg via INTRAVENOUS
  Filled 2019-02-01: qty 20

## 2019-02-01 MED ORDER — VECURONIUM BROMIDE 10 MG IV SOLR
INTRAVENOUS | Status: AC
  Filled 2019-02-01: qty 10

## 2019-02-01 MED ORDER — INSULIN DETEMIR 100 UNIT/ML ~~LOC~~ SOLN
50.0000 [IU] | Freq: Two times a day (BID) | SUBCUTANEOUS | Status: DC
  Administered 2019-02-01 – 2019-02-02 (×2): 50 [IU] via SUBCUTANEOUS
  Filled 2019-02-01 (×2): qty 0.5

## 2019-02-01 MED ORDER — ARTIFICIAL TEARS OPHTHALMIC OINT
1.0000 "application " | TOPICAL_OINTMENT | Freq: Three times a day (TID) | OPHTHALMIC | Status: DC
  Administered 2019-02-01 – 2019-02-02 (×5): 1 via OPHTHALMIC
  Filled 2019-02-01 (×2): qty 3.5

## 2019-02-01 MED ORDER — SODIUM CHLORIDE 0.9 % IV SOLN
1.0000 mg/kg/h | INTRAVENOUS | Status: DC
  Administered 2019-02-01: 1 mg/kg/h via INTRAVENOUS
  Filled 2019-02-01 (×5): qty 5

## 2019-02-01 MED ORDER — CHLORHEXIDINE GLUCONATE 0.12% ORAL RINSE (MEDLINE KIT)
15.0000 mL | Freq: Two times a day (BID) | OROMUCOSAL | Status: DC
  Administered 2019-02-01 – 2019-02-02 (×3): 15 mL via OROMUCOSAL

## 2019-02-01 MED ORDER — SENNOSIDES-DOCUSATE SODIUM 8.6-50 MG PO TABS
1.0000 | ORAL_TABLET | Freq: Two times a day (BID) | ORAL | Status: DC
  Administered 2019-02-01 – 2019-02-08 (×13): 1 via ORAL
  Filled 2019-02-01 (×12): qty 1

## 2019-02-01 MED ORDER — CHLORHEXIDINE GLUCONATE CLOTH 2 % EX PADS
6.0000 | MEDICATED_PAD | Freq: Every day | CUTANEOUS | Status: AC
  Administered 2019-02-01 – 2019-02-05 (×5): 6 via TOPICAL

## 2019-02-01 MED ORDER — INSULIN ASPART 100 UNIT/ML ~~LOC~~ SOLN
6.0000 [IU] | SUBCUTANEOUS | Status: DC
  Administered 2019-02-01 – 2019-02-02 (×5): 6 [IU] via SUBCUTANEOUS

## 2019-02-01 MED ORDER — SODIUM CHLORIDE 0.9 % IV SOLN
0.5000 mg/kg/h | INTRAVENOUS | Status: DC
  Administered 2019-02-01: 0.5 mg/kg/h via INTRAVENOUS
  Filled 2019-02-01 (×4): qty 5

## 2019-02-01 MED ORDER — MUPIROCIN 2 % EX OINT
1.0000 "application " | TOPICAL_OINTMENT | Freq: Two times a day (BID) | CUTANEOUS | Status: AC
  Administered 2019-02-01 – 2019-02-05 (×10): 1 via NASAL
  Filled 2019-02-01: qty 22

## 2019-02-01 MED ORDER — DEXAMETHASONE SODIUM PHOSPHATE 10 MG/ML IJ SOLN: 10.0000 mg | INTRAMUSCULAR | Status: DC

## 2019-02-01 MED ORDER — MIDAZOLAM 50MG/50ML (1MG/ML) PREMIX INFUSION
0.5000 mg/h | INTRAVENOUS | Status: DC
  Administered 2019-02-01: 2 mg/h via INTRAVENOUS
  Administered 2019-02-01: 7 mg/h via INTRAVENOUS
  Administered 2019-02-01 – 2019-02-02 (×2): 8 mg/h via INTRAVENOUS
  Administered 2019-02-02 (×3): 15 mg/h via INTRAVENOUS
  Administered 2019-02-03 (×2): 17 mg/h via INTRAVENOUS
  Administered 2019-02-03: 01:00:00 15 mg/h via INTRAVENOUS
  Administered 2019-02-03: 17 mg/h via INTRAVENOUS
  Administered 2019-02-03: 15 mg/h via INTRAVENOUS
  Administered 2019-02-03: 17 mg/h via INTRAVENOUS
  Administered 2019-02-04: 15 mg/h via INTRAVENOUS
  Administered 2019-02-04 (×2): 17 mg/h via INTRAVENOUS
  Administered 2019-02-04: 16 mg/h via INTRAVENOUS
  Administered 2019-02-04: 17 mg/h via INTRAVENOUS
  Administered 2019-02-04 – 2019-02-05 (×9): 16 mg/h via INTRAVENOUS
  Administered 2019-02-05: 15 mg/h via INTRAVENOUS
  Administered 2019-02-05: 16 mg/h via INTRAVENOUS
  Filled 2019-02-01 (×2): qty 50
  Filled 2019-02-01: qty 100
  Filled 2019-02-01 (×31): qty 50

## 2019-02-01 MED ORDER — FENTANYL BOLUS VIA INFUSION
50.0000 ug | INTRAVENOUS | Status: DC | PRN
  Administered 2019-02-01 (×3): 50 ug via INTRAVENOUS
  Filled 2019-02-01: qty 50

## 2019-02-01 MED ORDER — IPRATROPIUM-ALBUTEROL 20-100 MCG/ACT IN AERS
2.0000 | INHALATION_SPRAY | Freq: Four times a day (QID) | RESPIRATORY_TRACT | Status: DC | PRN
  Filled 2019-02-01: qty 4

## 2019-02-01 MED ORDER — MIDAZOLAM HCL 2 MG/2ML IJ SOLN
2.0000 mg | INTRAMUSCULAR | Status: DC | PRN
  Administered 2019-02-01 – 2019-02-10 (×3): 2 mg via INTRAVENOUS
  Filled 2019-02-01: qty 2

## 2019-02-01 MED ORDER — OXYCODONE HCL 5 MG PO TABS
10.0000 mg | ORAL_TABLET | Freq: Four times a day (QID) | ORAL | Status: DC
  Administered 2019-02-01 – 2019-02-03 (×7): 10 mg via ORAL
  Filled 2019-02-01 (×7): qty 2

## 2019-02-01 MED ORDER — VITAL HIGH PROTEIN PO LIQD
1000.0000 mL | ORAL | Status: DC
  Administered 2019-02-01 (×2): 1000 mL

## 2019-02-01 NOTE — Progress Notes (Signed)
[facility] Progress Note Patient Name: Anita Hunter DOB: 03/23/1990 MRN: [id number]   Date of Service  02/01/2019  HPI/Events of Note  Nursing reports BIS high in spite of sedation with Propofol and Fentanyl IV infusions.   eICU Interventions  Will order: 1. Versed 2 mg IV PRN sedation or agitation.      Intervention Category Major Interventions: Other:  Anita Hunter 02/01/2019, 3:47 AM

## 2019-02-01 NOTE — Progress Notes (Signed)
NAME:  Anita Hunter, MRN:  [id number], DOB:  11/06/1990, LOS: 6 ADMISSION DATE:  01/26/2019, CONSULTATION DATE:  01/31/19 REFERRING MD:  Avon Gully, CHIEF COMPLAINT:  breathlessness   Brief History   28 year old woman with hx of mild intermittent asthma, OSA presenting with severe COVID pneumonia.  History of present illness   28 year old woman with severe OSA supposed to be on home BIPAP (issues with mask leaks), DM2, seasonal allergies, chronic back pain presenting with URI symptoms who has developed significant COVID pneumonia.  Transferred to ICU this AM for abrupt worsening O2 sats.  Denies wheezing or chest pain.  She is anxious.  Past Medical History  DM2 Seasonal allergies Mild intermittent asthma OSA/OHS with recommended BIPAP 24/19 cm H2O Chronic back pain  Significant Hospital Events   01/26/19 admitted 01/31/19 transferred to ICU 02/01/19 intubated  Consults:  PCCM  Procedures:  N/A  Significant Diagnostic Tests:  CXR bilateral airspace disease  Micro Data:  COVID + HIV neg Pct neg  Antimicrobials:  Actemra 12/17 Remdesivir 12/16 >>12/20    Interim history/subjective:  Intubated for worsening hypoxemia overnight. Requiring versed/fentanyl/propofol infusions as well as intermittent paralytics to maintain ventilator synchrony Proned  Objective   Blood pressure 137/67, pulse 79, temperature 98.2 F (36.8 C), resp. rate (!) 24, height 5' 9"  (1.753 m), weight (!) 158.3 kg, SpO2 99 %.    Vent Mode: PRVC FiO2 (%):  [90 %-100 %] 90 % Set Rate:  [8 bmp-24 bmp] 24 bmp Vt Set:  [400 mL] 400 mL PEEP:  [15 cmH20] 15 cmH20 Plateau Pressure:  [33 cmH20-35 cmH20] 35 cmH20   Intake/Output Summary (Last 24 hours) at 02/01/2019 1100 Last data filed at 02/01/2019 0700 Gross per 24 hour  Intake 1336.99 ml  Output 2325 ml  Net -988.01 ml   Filed Weights   01/26/19 2050 01/31/19 0900  Weight: (!) 164 kg (!) 158.3 kg    Examination: General: oveweight  woman lying prone in bed HENT: ETT in place, minimal secretions Lungs: better air movement today, no wheezing Cardiovascular: ext warm, she is prone cannot hear heart Abdomen: cannot evaluate Extremities: trace edema Neuro: heavily sedated, withdraws to pain Skin: no rashes  Sugars out of control A-a gradient 167 proned BMP looks okay CBC looks okay  Resolved Hospital Problem list   N/A  Assessment & Plan:  # Acute hypoxemic respiratory failure due to COVID pneumonia- complicated by underlying OHS, severe anxiety, and possible contributing element of fluid overload.  She has asthma but does not sound in flare. - ARDSnet PEEP/FiO2, attempt to limit driving pressures as allowed by lung compliance - VAP prevention bundle -18/6h proning with A/a gradients < 150, adjust based on clinical response -Sedation to minimize shearing forces created by ventilator assynchrony - Keep even as able with balanced diuresis as tolerated by renal function 12/20: start ketamine, already on fentanyl/propofol/versed high doses, start intermittent paralytic  # DM2- A1c 11.1 - Increase levemir, start TF coverage - Goal FS 100-180  # AKI- mild, hold on diuretics today, trend, try to keep even  Best practice:  Diet: TF Pain/Anxiety/Delirium protocol (if indicated): see above VAP protocol (if indicated): ordered DVT prophylaxis: Lovenox 0.52m/kg BID, d dimer okay GI prophylaxis: pepcid Glucose control: see above Mobility: BR Code Status: full  Family Communication: will call today Disposition: ICU  Labs   CBC: Recent Labs  Lab 01/28/19 0852 01/29/19 0309 01/29/19 1650 01/30/19 0136 01/31/19 0413 02/01/19 0119 02/01/19 0430 02/01/19 0630  WBC 11.5* 11.6* 9.0 8.0 9.9  --  12.7*  --   NEUTROABS 10.1* 10.5* 8.0* 6.9 8.6*  --   --   --   HGB 13.5 13.9 13.1 13.6 14.0 15.3* 13.2 14.6  HCT 45.3 47.3* 43.0 45.7 47.3* 45.0 45.3 43.0  MCV 90.2 91.1 89.6 90.3 91.0  --  91.5  --   PLT 229 237 239  250 295  --  339  --     Basic Metabolic Panel: Recent Labs  Lab 01/26/19 1329 01/26/19 1521 01/28/19 0852 01/29/19 0309 01/30/19 0136 01/31/19 0413 02/01/19 0119 02/01/19 0430 02/01/19 0630  NA  --   --  134* 133* 137 135 134* 136 135  K  --   --  4.1 3.9 4.1 4.1 4.3 4.4 4.3  CL  --   --  93* 94* 95* 92*  --  89*  --   CO2  --   --  30 26 34* 34*  --  37*  --   GLUCOSE  --   --  303* 271* 290* 304*  --  316*  --   BUN  --   --  11 13 16 17   --  25*  --   CREATININE  --   --  0.75 0.79 0.71 0.69  --  1.07*  --   CALCIUM  --   --  8.0* 7.9* 7.9* 8.0*  --  7.1*  --   MG   < > 1.5* 2.0 1.9 2.3 2.3  --  2.4  --   PHOS  --  2.8 3.7 3.3 3.3 3.1  --   --   --    < > = values in this interval not displayed.   GFR: Estimated Creatinine Clearance: 128.4 mL/min (A) (by C-G formula based on SCr of 1.07 mg/dL (H)). Recent Labs  Lab 01/26/19 1521 01/29/19 1650 01/30/19 0136 01/31/19 0413 02/01/19 0430  PROCALCITON <0.10  --   --   --   --   WBC  --  9.0 8.0 9.9 12.7*    Liver Function Tests: Recent Labs  Lab 01/26/19 1521 01/28/19 0852 01/29/19 0309 01/30/19 0136 01/31/19 0413  AST 287* 82* 51* 42* 45*  ALT 156* 109* 83* 69* 58*  ALKPHOS 75 79 70 69 69  BILITOT 0.4 0.5 0.4 0.9 0.9  PROT 8.5* 8.4* 8.2* 8.7* 8.1  ALBUMIN 3.8 3.6 3.5 3.6 3.6   No results for input(s): LIPASE, AMYLASE in the last 168 hours. No results for input(s): AMMONIA in the last 168 hours.  ABG    Component Value Date/Time   PHART 7.307 (L) 02/01/2019 0630   PCO2ART 84.6 (HH) 02/01/2019 0630   PO2ART 167.0 (H) 02/01/2019 0630   HCO3 42.3 (H) 02/01/2019 0630   TCO2 45 (H) 02/01/2019 0630   O2SAT 99.0 02/01/2019 0630     Coagulation Profile: No results for input(s): INR, PROTIME in the last 168 hours.  Cardiac Enzymes: No results for input(s): CKTOTAL, CKMB, CKMBINDEX, TROPONINI in the last 168 hours.  HbA1C: Hemoglobin A1C  Date/Time Value Ref Range Status  03/02/2015 03:09 PM 6.2   Final   Hgb A1c MFr Bld  Date/Time Value Ref Range Status  01/26/2019 01:30 PM 11.1 (H) 4.8 - 5.6 % Final    Comment:    (NOTE) Pre diabetes:          5.7%-6.4% Diabetes:              >6.4% Glycemic control  for   <7.0% adults with diabetes     CBG: Recent Labs  Lab 01/31/19 0727 01/31/19 1152 01/31/19 1722 01/31/19 2121 02/01/19 0816  GLUCAP 290* 290* 334* 337* 276*    Review of Systems:    Positive Symptoms in bold:  Constitutional fevers, chills, weight loss, fatigue, anorexia, malaise  Eyes decreased vision, double vision, eye irritation  Ears, Nose, Mouth, Throat sore throat, trouble swallowing, sinus congestion  Cardiovascular chest pain, paroxysmal nocturnal dyspnea, lower ext edema, palpitations   Respiratory SOB, cough, DOE, hemoptysis, wheezing  Gastrointestinal nausea, vomiting, diarrhea  Genitourinary burning with urination, trouble urinating  Musculoskeletal joint aches, joint swelling, back pain  Integumentary  rashes, skin lesions  Neurological focal weakness, focal numbness, trouble speaking, headaches  Psychiatric depression, anxiety, confusion  Endocrine polyuria, polydipsia, cold intolerance, heat intolerance  Hematologic abnormal bruising, abnormal bleeding, unexplained nose bleeds  Allergic/Immunologic recurrent infections, hives, swollen lymph nodes     Past Medical History  She,  has a past medical history of Back pain, Eczema, Seasonal allergies, Sleep apnea, and Type 2 diabetes mellitus ([place]) (01/26/2019).   Surgical History    Past Surgical History:  Procedure Laterality Date  . NO PAST SURGERIES       Social History   reports that she has never smoked. She has never used smokeless tobacco. She reports that she does not drink alcohol or use drugs.   Family History   Her family history includes Asthma in her brother; Diabetes in her maternal grandmother; Fibroids in an other family member; Hypertension in her father, maternal  grandmother, and paternal grandmother; Migraines in her father.   Allergies Allergies  Allergen Reactions  . Shellfish Allergy Nausea Only     Home Medications  Prior to Admission medications   Medication Sig Start Date End Date Taking? Authorizing Provider  acetaminophen (TYLENOL) 500 MG tablet Take 1,000 mg by mouth every 6 (six) hours as needed for moderate pain or fever.   Yes [provider]  albuterol (VENTOLIN HFA) 108 (90 Base) MCG/ACT inhaler Inhale 2 puffs into the lungs every 6 (six) hours as needed for wheezing or shortness of breath.   Yes [provider]  fluticasone (FLONASE) 50 MCG/ACT nasal spray Place 2 sprays into both nostrils daily. 03/19/18  Yes Cresson Bing, DO  Phenyleph-Diphenhyd-DM-APAP ([employer] SEVERE COLD/FLU) Liquid LQPK Take 15 mLs by mouth 2 (two) times daily as needed (cold/flu symptoms).    Yes [provider]  triamcinolone ointment (KENALOG) 0.1 % Apply 1 application topically 2 (two) times daily. Patient taking differently: Apply 1 application topically daily.  03/19/18  Yes Ericson Bing, DO  famotidine (PEPCID) 20 MG tablet Take 1 tablet (20 mg total) by mouth 2 (two) times daily. Patient not taking: Reported on 04/16/2018 10/21/16   Charlesetta Shanks, MD  ibuprofen (ADVIL) 800 MG tablet Take 1 tablet (800 mg total) by mouth 3 (three) times daily. Patient not taking: Reported on 01/26/2019 11/28/18   Palumbo, April, MD  metFORMIN (GLUCOPHAGE) 500 MG tablet Take 1 tablet (500 mg total) by mouth daily with breakfast. Patient not taking: Reported on 01/26/2019 05/05/18   Drenda Freeze, MD     Critical care time: 33 minutes not including any separately billable procedures

## 2019-02-01 NOTE — Progress Notes (Signed)
Called Family to update on deteriorating condition of patient and need to intubate. Answered all questions and ensure that bedside RN would call for an update after intubation

## 2019-02-01 NOTE — Progress Notes (Signed)
Patient intubated under care of PCCM. Will sign off for now.  Please let us know when we can be of further assistance. [facility]

## 2019-02-01 NOTE — Progress Notes (Signed)
Pt head and arms repositioned. Pt ETT remains secure at 24. Suction catheter able to be passed. Pt tolerated well, RT will continue to monitor.

## 2019-02-01 NOTE — Progress Notes (Signed)
eLink Physician-Brief Progress Note Patient Name: Anita Hunter DOB: 1990/04/24 MRN: [id number]   Date of Service  02/01/2019  HPI/Events of Note  Severe hypoxia s/p intubation.   eICU Interventions  Plan: 1. Continuous NMB per protocol. 2. Prone patient when she is more stable.      Intervention Category Major Interventions: Respiratory failure - evaluation and management;Hypoxemia - evaluation and management  Lysle Dingwall 02/01/2019, 12:35 AM

## 2019-02-01 NOTE — Progress Notes (Signed)
pts mother called for update, updated via phone. Valinda Party

## 2019-02-01 NOTE — Progress Notes (Signed)
Pt flipped to supine at this time w/o event.  Pt has cloth tube holder until ABG is obtained.

## 2019-02-01 NOTE — Progress Notes (Signed)
Called an updated mother.  Erskine Emery MD

## 2019-02-01 NOTE — Progress Notes (Addendum)
[place] [facility] Progress Note Patient Name: Anita Hunter DOB: Mar 31, 1990 MRN: [id number]   Date of Service  02/01/2019  HPI/Events of Note  Versed IV PRN not holding the patient.  eICU Interventions  Will order: 1. Versed IV infusion. Titrate to desired RASS/BIS     Intervention Category Major Interventions: Delirium, psychosis, severe agitation - evaluation and management  [name] 02/01/2019, 5:38 AM

## 2019-02-01 NOTE — Progress Notes (Signed)
Head and arms repositioned. ETT remains secure at 24 and suction catheter able to be passed. FiO2 weaned to 90%. Pt tolerated well, RT will continue to monitor.

## 2019-02-01 NOTE — Plan of Care (Signed)
Around 10 PM last night I evaluated patient at bedside for persistent hypoxia.  Initially when I saw patient she was mildly tachypneic in the low 30s but comfortable, speaking in full sentences in appear to be satting 90 to 91% on heated high flow with nonrebreather.  When I reevaluated patient about half hour later she was self proned however persistently hypoxic in the high 70s low 80s on her sats.  We determined that we would attempt to trial BiPAP again and if unable to achieve sats greater than 85%, would plan to intubate.  Ultimately patient was intubated due to persistent hypoxia and inability to tolerate BiPAP.

## 2019-02-01 NOTE — Progress Notes (Signed)
RT assisted with turning patient to prone position.  Foam padding placed on checks, upper lip and forehead.  ETT secured with cloth tape @ 25 cm center of lip.  RT x2 and RN x 4.  Oral care done.  Prone pillow under patients head.  VS stable throughout

## 2019-02-01 NOTE — Progress Notes (Signed)
Patients mother called for update. Updated on condition. Provided emotional support and assured her MD would call her today. Ellamae Sia

## 2019-02-02 ENCOUNTER — Inpatient Hospital Stay (HOSPITAL_COMMUNITY): Payer: BC Managed Care – PPO

## 2019-02-02 LAB — POCT I-STAT 7, (LYTES, BLD GAS, ICA,H+H)
Acid-Base Excess: 13 mmol/L — ABNORMAL HIGH (ref 0.0–2.0)
Bicarbonate: 41.9 mmol/L — ABNORMAL HIGH (ref 20.0–28.0)
Calcium, Ion: 0.86 mmol/L — CL (ref 1.15–1.40)
HCT: 39 % (ref 36.0–46.0)
Hemoglobin: 13.3 g/dL (ref 12.0–15.0)
O2 Saturation: 100 %
Patient temperature: 99
Potassium: 3.6 mmol/L (ref 3.5–5.1)
Sodium: 134 mmol/L — ABNORMAL LOW (ref 135–145)
TCO2: 44 mmol/L — ABNORMAL HIGH (ref 22–32)
pCO2 arterial: 73.6 mmHg (ref 32.0–48.0)
pH, Arterial: 7.364 (ref 7.350–7.450)
pO2, Arterial: 374 mmHg — ABNORMAL HIGH (ref 83.0–108.0)

## 2019-02-02 LAB — GLUCOSE, CAPILLARY
Glucose-Capillary: 352 mg/dL — ABNORMAL HIGH (ref 70–99)
Glucose-Capillary: 223 mg/dL — ABNORMAL HIGH (ref 70–99)
Glucose-Capillary: 195 mg/dL — ABNORMAL HIGH (ref 70–99)
Glucose-Capillary: 203 mg/dL — ABNORMAL HIGH (ref 70–99)
Glucose-Capillary: 243 mg/dL — ABNORMAL HIGH (ref 70–99)
Glucose-Capillary: 253 mg/dL — ABNORMAL HIGH (ref 70–99)
Glucose-Capillary: 223 mg/dL — ABNORMAL HIGH (ref 70–99)

## 2019-02-02 LAB — CBC
HCT: 40.8 % (ref 36.0–46.0)
Hemoglobin: 13.1 g/dL (ref 12.0–15.0)
MCH: 29.1 pg (ref 26.0–34.0)
MCHC: 32.1 g/dL (ref 30.0–36.0)
MCV: 90.7 fL (ref 80.0–100.0)
Platelets: 283 10*3/uL (ref 150–400)
RBC: 4.5 MIL/uL (ref 3.87–5.11)
RDW: 12.1 % (ref 11.5–15.5)
WBC: 12.3 10*3/uL — ABNORMAL HIGH (ref 4.0–10.5)
nRBC: 0 % (ref 0.0–0.2)

## 2019-02-02 LAB — BASIC METABOLIC PANEL
Anion gap: 12 (ref 5–15)
BUN: 19 mg/dL (ref 6–20)
CO2: 35 mmol/L — ABNORMAL HIGH (ref 22–32)
Calcium: 6.9 mg/dL — ABNORMAL LOW (ref 8.9–10.3)
Chloride: 89 mmol/L — ABNORMAL LOW (ref 98–111)
Creatinine, Ser: 0.63 mg/dL (ref 0.44–1.00)
GFR calc Af Amer: >60 mL/min (ref >60)
GFR calc non Af Amer: >60 mL/min (ref >60)
Glucose, Bld: 232 mg/dL — ABNORMAL HIGH (ref 70–99)
Potassium: 4.1 mmol/L (ref 3.5–5.1)
Sodium: 136 mmol/L (ref 135–145)

## 2019-02-02 LAB — TRIGLYCERIDES: Triglycerides: 2382 mg/dL — ABNORMAL HIGH (ref <150)

## 2019-02-02 LAB — MAGNESIUM: Magnesium: 3 mg/dL — ABNORMAL HIGH (ref 1.7–2.4)

## 2019-02-02 LAB — PHOSPHORUS: Phosphorus: 3.5 mg/dL (ref 2.5–4.6)

## 2019-02-02 MED ORDER — VECURONIUM BROMIDE 10 MG IV SOLR
10.0000 mg | INTRAVENOUS | Status: DC | PRN
  Administered 2019-02-02: 10 mg via INTRAVENOUS
  Filled 2019-02-02: qty 10

## 2019-02-02 MED ORDER — FENTANYL BOLUS VIA INFUSION
50.0000 ug | INTRAVENOUS | Status: DC | PRN
  Filled 2019-02-02: qty 50

## 2019-02-02 MED ORDER — INSULIN DETEMIR 100 UNIT/ML ~~LOC~~ SOLN
60.0000 [IU] | Freq: Two times a day (BID) | SUBCUTANEOUS | Status: DC
  Administered 2019-02-02 – 2019-02-03 (×2): 60 [IU] via SUBCUTANEOUS
  Filled 2019-02-02 (×3): qty 0.6

## 2019-02-02 MED ORDER — FENTANYL CITRATE (PF) 2500 MCG/50ML IJ SOLN
50.0000 ug/h | INTRAMUSCULAR | Status: DC
  Administered 2019-02-02 – 2019-02-03 (×5): 400 ug/h via INTRAVENOUS
  Filled 2019-02-02 (×6): qty 50

## 2019-02-02 MED ORDER — FUROSEMIDE 10 MG/ML IJ SOLN
40.0000 mg | Freq: Three times a day (TID) | INTRAMUSCULAR | Status: AC
  Administered 2019-02-02 – 2019-02-04 (×8): 40 mg via INTRAVENOUS
  Filled 2019-02-02 (×8): qty 4

## 2019-02-02 MED ORDER — FENTANYL 2500MCG IN NS 250ML (10MCG/ML) PREMIX INFUSION
50.0000 ug/h | INTRAVENOUS | Status: DC
  Administered 2019-02-02: 400 ug/h via INTRAVENOUS

## 2019-02-02 MED ORDER — PROPOFOL 1000 MG/100ML IV EMUL
25.0000 ug/kg/min | INTRAVENOUS | Status: DC
  Administered 2019-02-02 (×4): 80 ug/kg/min via INTRAVENOUS
  Administered 2019-02-02: 82.681 ug/kg/min via INTRAVENOUS
  Administered 2019-02-02 – 2019-02-03 (×6): 80 ug/kg/min via INTRAVENOUS
  Filled 2019-02-02 (×12): qty 100
  Filled 2019-02-02: qty 200
  Filled 2019-02-02: qty 100

## 2019-02-02 MED ORDER — VITAL 1.5 CAL PO LIQD
1000.0000 mL | ORAL | Status: DC
  Administered 2019-02-03 – 2019-02-08 (×6): 1000 mL

## 2019-02-02 MED ORDER — CHLORHEXIDINE GLUCONATE 0.12 % MT SOLN
15.0000 mL | Freq: Two times a day (BID) | OROMUCOSAL | Status: DC
  Administered 2019-02-02 – 2019-02-19 (×35): 15 mL via OROMUCOSAL
  Filled 2019-02-02 (×23): qty 15

## 2019-02-02 MED ORDER — DEXMEDETOMIDINE HCL IN NACL 400 MCG/100ML IV SOLN
0.4000 ug/kg/h | INTRAVENOUS | Status: DC
  Administered 2019-02-03 – 2019-02-06 (×12): 0.4 ug/kg/h via INTRAVENOUS
  Filled 2019-02-02 (×12): qty 100

## 2019-02-02 MED ORDER — ACETAZOLAMIDE SODIUM 500 MG IJ SOLR
500.0000 mg | Freq: Once | INTRAMUSCULAR | Status: AC
  Administered 2019-02-02: 500 mg via INTRAVENOUS
  Filled 2019-02-02: qty 500

## 2019-02-02 MED ORDER — FENTANYL CITRATE (PF) 100 MCG/2ML IJ SOLN: 50.0000 ug | Freq: Once | INTRAMUSCULAR | Status: DC

## 2019-02-02 MED ORDER — PRO-STAT SUGAR FREE PO LIQD
60.0000 mL | Freq: Three times a day (TID) | ORAL | Status: DC
  Administered 2019-02-02 – 2019-02-09 (×21): 60 mL
  Filled 2019-02-02 (×19): qty 60

## 2019-02-02 MED ORDER — ARTIFICIAL TEARS OPHTHALMIC OINT
1.0000 "application " | TOPICAL_OINTMENT | Freq: Three times a day (TID) | OPHTHALMIC | Status: DC
  Administered 2019-02-02 – 2019-02-06 (×13): 1 via OPHTHALMIC

## 2019-02-02 MED ORDER — INSULIN ASPART 100 UNIT/ML ~~LOC~~ SOLN: 10.0000 [IU] | SUBCUTANEOUS | Status: DC

## 2019-02-02 NOTE — Progress Notes (Signed)
NAME:  Anita Hunter, MRN:  [id number], DOB:  1990-12-25, LOS: 7 ADMISSION DATE:  01/26/2019, CONSULTATION DATE:  01/31/19 REFERRING MD:  Avon Gully, CHIEF COMPLAINT:  breathlessness   Brief History   28 year old woman with hx of mild intermittent asthma, OSA presenting with severe COVID pneumonia.  History of present illness   28 year old woman with severe OSA supposed to be on home BIPAP (issues with mask leaks), DM2, seasonal allergies, chronic back pain presenting with URI symptoms who has developed significant COVID pneumonia.  Transferred to ICU this AM for abrupt worsening O2 sats.  Denies wheezing or chest pain.  She is anxious.  Past Medical History  DM2 Seasonal allergies Mild intermittent asthma OSA/OHS with recommended BIPAP 24/19 cm H2O Chronic back pain  Significant Hospital Events   01/26/19 admitted 01/31/19 transferred to ICU 02/01/19 intubated  Consults:  PCCM  Procedures:  N/A  Significant Diagnostic Tests:  CXR bilateral airspace disease  Micro Data:  COVID + HIV neg Pct neg  Antimicrobials:  Actemra 12/17 Remdesivir 12/16 >>12/20    Interim history/subjective:  Still extremely hard to sedate, she is prone this morning.  Objective   Blood pressure 138/77, pulse 66, temperature 98.7 F (37.1 C), temperature source Axillary, resp. rate (!) 24, height 5' 9"  (1.753 m), weight (!) 153.2 kg, SpO2 97 %.    Vent Mode: PRVC FiO2 (%):  [80 %-100 %] 80 % Set Rate:  [24 bmp] 24 bmp Vt Set:  [400 mL] 400 mL PEEP:  [18 cmH20] 18 cmH20 Plateau Pressure:  [26 cmH20-35 cmH20] 33 cmH20   Intake/Output Summary (Last 24 hours) at 02/02/2019 1039 Last data filed at 02/02/2019 0600 Gross per 24 hour  Intake 3515.08 ml  Output 1650 ml  Net 1865.08 ml   Filed Weights   01/26/19 2050 01/31/19 0900 02/02/19 0500  Weight: (!) 164 kg (!) 158.3 kg (!) 153.2 kg    Examination: General: oveweight woman lying prone in bed HENT: ETT in place, minimal  secretions Lungs: better air movement today, no wheezing Cardiovascular: ext warm, she is prone cannot hear heart Abdomen: cannot evaluate Extremities: trace edema Neuro: heavily sedated, withdraws to pain Skin: no rashes  Sugars a little better today but still high Net +2 L yesterday, 1600 cc urine output Creatinine improved today, triglycerides are now 2300 LE duplex pending CBC unchanged and benign  Resolved Hospital Problem list   N/A  Assessment & Plan:  # Acute hypoxemic respiratory failure due to COVID pneumonia- complicated by underlying OHS, severe anxiety, and possible contributing element of fluid overload.  She has asthma but does not sound in flare. - ARDSnet PEEP/FiO2, attempt to limit driving pressures as allowed by lung compliance - VAP prevention bundle -16/8 h proning with A/a gradients < 150, adjust based on clinical response -Sedation to minimize shearing forces created by ventilator assynchrony - Keep even as able with balanced diuresis as tolerated by renal function 12/21: Going to have to wean off propofol with how bad her triglycerides are, continue Versed/ketamine/opiate drips, will add Precedex, can try to see how she does without paralytics  # DM2- A1c 11.1 - Increase levemir and TF coverage again today - Goal FS 100-180  # AKI- improved, rechallenge with diuretics today, monitor I/O, BMET  Best practice:  Diet: TF Pain/Anxiety/Delirium protocol (if indicated): see above VAP protocol (if indicated): ordered DVT prophylaxis: Lovenox 0.96m/kg BID, d dimer okay GI prophylaxis: pepcid Glucose control: see above Mobility: BR Code Status: full  Family Communication: will call today Disposition: ICU  Critical care time: 35 minutes not including any separately billable procedures

## 2019-02-02 NOTE — CV Procedure (Signed)
Lower extremity venous not completed, patient is proned at this time.  Darlina Sicilian RDCS

## 2019-02-02 NOTE — Progress Notes (Signed)
Pt proned at this time after replacing tube holder with new tape. No complications to note

## 2019-02-02 NOTE — Progress Notes (Signed)
 Nutrition Follow-up  DOCUMENTATION CODES:   Morbid obesity  INTERVENTION:   Tube Feeding:  Vital 1.5 at 40 ml/hr Pro-Stat 60 mL TID Provides 155 g of protein, 2040 kcals, 730 mL of free water Meets 100% of protein needs, 85% calorie needs  NUTRITION DIAGNOSIS:   Inadequate oral intake related to acute illness as evidenced by NPO status.  GOAL:   Patient will meet greater than or equal to 90% of their needs  MONITOR:   Vent status, TF tolerance, Skin, Labs, Weight trends  REASON FOR ASSESSMENT:   Ventilator, Consult Enteral/tube feeding initiation and management  ASSESSMENT:   28 yo female admitted with severe COVID pneumonia on 12/14 ultimately requiring intubation on 12/20. PMH includes DM, seasonal allergies, asthma, OSA/OHS   RD working remotely.  12/14 Admit 12/20 Intubated   Patient is currently intubated on ventilator support MV: 7.7 L/min Temp (24hrs), Avg:97.6 F (36.4 C), Min:97.3 F (36.3 C), Max:98.7 F (37.1 C)  Propofol: OFF due to high TG  Recorded po intake po 75-100% of meals prior to intubation  Unable to obtain diet and weight history at this time  Labs: TG 2382 (H); CBG 195-244 Meds: decadron, ss novolog, levemir, novolog  Diet Order:   Diet Order            Diet NPO time specified  Diet effective now              EDUCATION NEEDS:   Not appropriate for education at this time  Skin:  Skin Assessment: Reviewed RN Assessment  Last BM:  12/17  Height:   Ht Readings from Last 1 Encounters:  01/29/19 5' 9"  (1.753 m)    Weight:   Wt Readings from Last 1 Encounters:  02/02/19 (!) 153.2 kg    Ideal Body Weight:  65.9 kg(adjusted weight 101 kg)  BMI:  Body mass index is 49.88 kg/m.  Estimated Nutritional Needs:   Kcal:  4944-9675 kcals  Protein:  132-165 g  Fluid:  >/= 2L   Rico Junker MS, RDN, LDN, CNSC [phone] Pager  [phone] Weekend/On-Call Pager

## 2019-02-02 NOTE — Progress Notes (Signed)
Patient's head repositioned and arms rotated.  Patient tolerated well.  No skin breakdown noted at this time.

## 2019-02-02 NOTE — Progress Notes (Signed)
Patient's mother returned call from day shift. Updated on patient's care, oxygen needs, sedation, and status. All questions answered.

## 2019-02-02 NOTE — Progress Notes (Signed)
Attempted to call pt's mother at this time. No answer. Left voicemail stating there was no emergency, but was calling to give daily update.

## 2019-02-02 NOTE — Progress Notes (Signed)
Pt supined at this time w/o event. RT will follow with ABG within one hour.

## 2019-02-03 ENCOUNTER — Inpatient Hospital Stay (HOSPITAL_COMMUNITY): Payer: BC Managed Care – PPO

## 2019-02-03 ENCOUNTER — Other Ambulatory Visit: Payer: Self-pay

## 2019-02-03 DIAGNOSIS — U071 COVID-19: Secondary | ICD-10-CM

## 2019-02-03 DIAGNOSIS — R0602 Shortness of breath: Secondary | ICD-10-CM

## 2019-02-03 LAB — BASIC METABOLIC PANEL
Anion gap: 19 — ABNORMAL HIGH (ref 5–15)
BUN: 23 mg/dL — ABNORMAL HIGH (ref 6–20)
CO2: 23 mmol/L (ref 22–32)
Calcium: 5.5 mg/dL — CL (ref 8.9–10.3)
Chloride: 93 mmol/L — ABNORMAL LOW (ref 98–111)
Creatinine, Ser: 1.43 mg/dL — ABNORMAL HIGH (ref 0.44–1.00)
GFR calc Af Amer: 58 mL/min — ABNORMAL LOW (ref >60)
GFR calc non Af Amer: 50 mL/min — ABNORMAL LOW (ref >60)
Glucose, Bld: 122 mg/dL — ABNORMAL HIGH (ref 70–99)
Potassium: 6.3 mmol/L (ref 3.5–5.1)
Sodium: 135 mmol/L (ref 135–145)

## 2019-02-03 LAB — POCT I-STAT 7, (LYTES, BLD GAS, ICA,H+H)
Acid-Base Excess: 14 mmol/L — ABNORMAL HIGH (ref 0.0–2.0)
Bicarbonate: 42.1 mmol/L — ABNORMAL HIGH (ref 20.0–28.0)
Calcium, Ion: 0.84 mmol/L — CL (ref 1.15–1.40)
HCT: 42 % (ref 36.0–46.0)
Hemoglobin: 14.3 g/dL (ref 12.0–15.0)
O2 Saturation: 98 %
Patient temperature: 37.1
Potassium: 3.5 mmol/L (ref 3.5–5.1)
Sodium: 134 mmol/L — ABNORMAL LOW (ref 135–145)
TCO2: 44 mmol/L — ABNORMAL HIGH (ref 22–32)
pCO2 arterial: 67.9 mmHg (ref 32.0–48.0)
pH, Arterial: 7.401 (ref 7.350–7.450)
pO2, Arterial: 112 mmHg — ABNORMAL HIGH (ref 83.0–108.0)

## 2019-02-03 LAB — CBC
HCT: 36.9 % (ref 36.0–46.0)
Hemoglobin: 13 g/dL (ref 12.0–15.0)
MCH: 31.3 pg (ref 26.0–34.0)
MCHC: 35.2 g/dL (ref 30.0–36.0)
MCV: 88.7 fL (ref 80.0–100.0)
Platelets: 338 10*3/uL (ref 150–400)
RBC: 4.16 MIL/uL (ref 3.87–5.11)
RDW: 12.6 % (ref 11.5–15.5)
WBC: 10.7 10*3/uL — ABNORMAL HIGH (ref 4.0–10.5)
nRBC: 0 % (ref 0.0–0.2)

## 2019-02-03 LAB — TRIGLYCERIDES: Triglycerides: 4733 mg/dL — ABNORMAL HIGH (ref <150)

## 2019-02-03 LAB — GLUCOSE, CAPILLARY
Glucose-Capillary: 161 mg/dL — ABNORMAL HIGH (ref 70–99)
Glucose-Capillary: 154 mg/dL — ABNORMAL HIGH (ref 70–99)
Glucose-Capillary: 215 mg/dL — ABNORMAL HIGH (ref 70–99)
Glucose-Capillary: 308 mg/dL — ABNORMAL HIGH (ref 70–99)
Glucose-Capillary: 287 mg/dL — ABNORMAL HIGH (ref 70–99)

## 2019-02-03 LAB — MAGNESIUM: Magnesium: 2.4 mg/dL (ref 1.7–2.4)

## 2019-02-03 LAB — PHOSPHORUS

## 2019-02-03 MED ORDER — HYDROMORPHONE BOLUS VIA INFUSION
0.5000 mg | INTRAVENOUS | Status: DC | PRN
  Administered 2019-02-10 – 2019-02-11 (×4): 0.5 mg via INTRAVENOUS
  Filled 2019-02-03: qty 1

## 2019-02-03 MED ORDER — CALCIUM GLUCONATE-NACL 1-0.675 GM/50ML-% IV SOLN
1.0000 g | Freq: Once | INTRAVENOUS | Status: AC
  Administered 2019-02-03: 16:00:00 1000 mg via INTRAVENOUS
  Filled 2019-02-03: qty 50

## 2019-02-03 MED ORDER — INSULIN DETEMIR 100 UNIT/ML ~~LOC~~ SOLN
65.0000 [IU] | Freq: Two times a day (BID) | SUBCUTANEOUS | Status: DC
  Administered 2019-02-03: 65 [IU] via SUBCUTANEOUS
  Filled 2019-02-03 (×2): qty 0.65

## 2019-02-03 MED ORDER — OXYCODONE HCL 5 MG PO TABS
10.0000 mg | ORAL_TABLET | ORAL | Status: DC
  Administered 2019-02-03 – 2019-02-11 (×42): 10 mg via ORAL
  Filled 2019-02-03 (×42): qty 2

## 2019-02-03 MED ORDER — INSULIN ASPART 100 UNIT/ML ~~LOC~~ SOLN
12.0000 [IU] | SUBCUTANEOUS | Status: DC
  Administered 2019-02-03 – 2019-02-05 (×11): 12 [IU] via SUBCUTANEOUS

## 2019-02-03 MED ORDER — SODIUM CHLORIDE 0.9 % IV SOLN
0.5000 mg/h | INTRAVENOUS | Status: DC
  Administered 2019-02-03: 0.5 mg/h via INTRAVENOUS
  Administered 2019-02-04 – 2019-02-08 (×4): 1 mg/h via INTRAVENOUS
  Administered 2019-02-09: 1.25 mg/h via INTRAVENOUS
  Administered 2019-02-10: 3 mg/h via INTRAVENOUS
  Administered 2019-02-10: 2 mg/h via INTRAVENOUS
  Filled 2019-02-03 (×7): qty 5

## 2019-02-03 MED ORDER — QUETIAPINE FUMARATE 50 MG PO TABS
50.0000 mg | ORAL_TABLET | Freq: Every day | ORAL | Status: DC
  Administered 2019-02-03 – 2019-02-10 (×8): 50 mg via ORAL
  Filled 2019-02-03 (×8): qty 1

## 2019-02-03 NOTE — Progress Notes (Signed)
BMP and Triglycerides drawn via art. Line and sent to lab at this time; charge nurse present to witness labs sent.

## 2019-02-03 NOTE — Progress Notes (Signed)
Facetime with family members completed at this time.

## 2019-02-03 NOTE — Progress Notes (Signed)
Patient's belongings re-inventoried with this nurse and Denyse Amass, clothes in the bag. Also found was a cell phone and charger, both items were tagged and sent to security by Clyde Lundborg, RN.

## 2019-02-03 NOTE — Progress Notes (Signed)
Bilateral lower extremity venous duplex complete. Please see CV Proc tab for preliminary results. Lita Mains- RDMS, RVT 3:38 PM  02/03/2019

## 2019-02-03 NOTE — Progress Notes (Signed)
EKG performed at this time and reviewed by Dr. Tamala Julian at bedside. Copy of EKG in pt's physical chart.

## 2019-02-03 NOTE — Progress Notes (Addendum)
NAME:  Anita Hunter, MRN:  [id number], DOB:  1990/04/11, LOS: 8 ADMISSION DATE:  01/26/2019, CONSULTATION DATE:  01/31/19 REFERRING MD:  Avon Gully, CHIEF COMPLAINT:  breathlessness   Brief History   28 year old woman with severe OSA supposed to be on home BIPAP (issues with mask leaks), DM2, seasonal allergies, chronic back pain presenting with URI symptoms who has developed significant COVID pneumonia.  Transferred to ICU 12/19 for abrupt worsening O2 sats.  Subsequently intubated 12/20.   Past Medical History  DM2 Seasonal allergies Mild intermittent asthma OSA/OHS with recommended BIPAP 24/19 cm H2O Chronic back pain  Significant Hospital Events   01/26/19 admitted 01/31/19 transferred to ICU 02/01/19 intubated  Consults:  PCCM  Procedures:  ETT 12/20 >>  RUE PICC 12/19 >>   Significant Diagnostic Tests:  LE Venous Duplex 12/21 >>   Micro Data:  COVID 12/14 >> positive HIV 12/14 >> neg  Antimicrobials:  Actemra 12/17 Remdesivir 12/16 >>12/20    Interim history/subjective:  Supine this am FiO2 60%, PEEP 16, Peak 32, Pplat 30, Driving pressure 14, P/F 187 Glucose - 150-250 in last 24h Tmax 99.1 / WBC 10 I/O - 3.4L UOP, net neg 1.1L in 24 hours BMP, triglycerides pending Off propofol Continues on ketamine, precedex Changed from fentanyl to dilaudid   Objective   Blood pressure 128/73, pulse 77, temperature 99.1 F (37.3 C), resp. rate (!) 30, height 5' 9"  (1.753 m), weight (!) 153.2 kg, SpO2 96 %.    Vent Mode: PRVC FiO2 (%):  [50 %-100 %] 50 % Set Rate:  [24 bmp-30 bmp] 30 bmp Vt Set:  [400 mL] 400 mL PEEP:  [16 cmH20-18 cmH20] 16 cmH20 Plateau Pressure:  [30 cmH20-37 cmH20] 30 cmH20   Intake/Output Summary (Last 24 hours) at 02/03/2019 1237 Last data filed at 02/03/2019 0730 Gross per 24 hour  Intake 3280.95 ml  Output 3750 ml  Net -469.05 ml   Filed Weights   01/26/19 2050 01/31/19 0900 02/02/19 0500  Weight: (!) 164 kg (!) 158.3 kg (!)  153.2 kg    Examination: General: young adult female, critically ill appearing on vent in NAD HEENT: MM pink/moist, ETT, pupils 48m  Neuro: sedate CV: s1s2 rrr, no m/r/g PULM:  Synchronous with vent, lungs bilaterally diminished  GI: soft, bsx4 active  Extremities: warm/dry, no overt edema but difficult to assess with body habitus  Skin: no rashes or lesions  Resolved Hospital Problem list      Assessment & Plan:   Acute hypoxemic respiratory failure due to COVID pneumonia Complicated by underlying OHS, severe anxiety, and possible contributing element of fluid overload.  She has asthma but does not sound in flare.  Completed remdesivir.  -low Vt ventilation 4-8cc/kg -goal plateau pressure <30, driving pressure <<38cm H2O -target PaO2 55-65, titrate PEEP/FiO2 per ARDS protocol  -if P/F ratio <150, consider prone therapy for 16 hours per day.  Hold 12/22 -goal CVP <4, diuresis as necessary -VAP prevention measures  -follow intermittent CXR  -continue decadron   Sedation Needs on Mechanical Ventilation  -transition from fentanyl to dilaudid  -PRN NMB  -stop propofol given hypertriglyceridemia  -continue ketamine, precedex -increase oral opiates to Q4 -add seroquel   DM II Hgb A1c 11.1 -increase levemir to 65 units BID  -increase TF coverage to 12 units Q4 -glucose goal 140-180 -continue linagliptin  AKI -await 12/22 labs, consider re-challenge with lasix pending review -Trend BMP / urinary output -Replace electrolytes as indicated -Avoid nephrotoxic agents, ensure  adequate renal perfusion  Hypertriglyceridemia -stop propofol -follow triglycerides   Best practice:  Diet: TF Pain/Anxiety/Delirium protocol (if indicated): PAD as above VAP protocol (if indicated): ordered DVT prophylaxis: Lovenox 0.94m/kg BID  GI prophylaxis: pepcid Glucose control: SSI Mobility: BR Code Status: full code Family Communication: Mother updated via phone.  Offered video visit  through [facility]   Disposition: ICU  Critical care time: 30 minutes     BNoe Gens MSN, NP-C [facility] 02/03/2019, 12:37 PM   Please see [email or web address] for pager details.

## 2019-02-04 ENCOUNTER — Inpatient Hospital Stay (HOSPITAL_COMMUNITY): Payer: BC Managed Care – PPO

## 2019-02-04 DIAGNOSIS — E781 Pure hyperglyceridemia: Secondary | ICD-10-CM | POA: Diagnosis present

## 2019-02-04 LAB — BASIC METABOLIC PANEL
Anion gap: 10 (ref 5–15)
BUN: 29 mg/dL — ABNORMAL HIGH (ref 6–20)
CO2: 36 mmol/L — ABNORMAL HIGH (ref 22–32)
Calcium: 6.5 mg/dL — ABNORMAL LOW (ref 8.9–10.3)
Chloride: 93 mmol/L — ABNORMAL LOW (ref 98–111)
Creatinine, Ser: 0.76 mg/dL (ref 0.44–1.00)
GFR calc Af Amer: >60 mL/min (ref >60)
GFR calc non Af Amer: >60 mL/min (ref >60)
Glucose, Bld: 222 mg/dL — ABNORMAL HIGH (ref 70–99)
Potassium: 3.2 mmol/L — ABNORMAL LOW (ref 3.5–5.1)
Sodium: 139 mmol/L (ref 135–145)

## 2019-02-04 LAB — LIPASE, BLOOD: Lipase: 65 U/L — ABNORMAL HIGH (ref 11–51)

## 2019-02-04 LAB — POCT I-STAT 7, (LYTES, BLD GAS, ICA,H+H)
Acid-Base Excess: 14 mmol/L — ABNORMAL HIGH (ref 0.0–2.0)
Bicarbonate: 42.6 mmol/L — ABNORMAL HIGH (ref 20.0–28.0)
Calcium, Ion: 0.92 mmol/L — ABNORMAL LOW (ref 1.15–1.40)
HCT: 42 % (ref 36.0–46.0)
Hemoglobin: 14.3 g/dL (ref 12.0–15.0)
O2 Saturation: 91 %
Patient temperature: 37.7
Potassium: 3.8 mmol/L (ref 3.5–5.1)
Sodium: 140 mmol/L (ref 135–145)
TCO2: 45 mmol/L — ABNORMAL HIGH (ref 22–32)
pCO2 arterial: 69.4 mmHg (ref 32.0–48.0)
pH, Arterial: 7.399 (ref 7.350–7.450)
pO2, Arterial: 66 mmHg — ABNORMAL LOW (ref 83.0–108.0)

## 2019-02-04 LAB — GLUCOSE, CAPILLARY
Glucose-Capillary: 143 mg/dL — ABNORMAL HIGH (ref 70–99)
Glucose-Capillary: 203 mg/dL — ABNORMAL HIGH (ref 70–99)
Glucose-Capillary: 207 mg/dL — ABNORMAL HIGH (ref 70–99)
Glucose-Capillary: 223 mg/dL — ABNORMAL HIGH (ref 70–99)
Glucose-Capillary: 235 mg/dL — ABNORMAL HIGH (ref 70–99)
Glucose-Capillary: 239 mg/dL — ABNORMAL HIGH (ref 70–99)

## 2019-02-04 LAB — MAGNESIUM: Magnesium: 2.7 mg/dL — ABNORMAL HIGH (ref 1.7–2.4)

## 2019-02-04 LAB — PHOSPHORUS: Phosphorus: 2.9 mg/dL (ref 2.5–4.6)

## 2019-02-04 LAB — TRIGLYCERIDES: Triglycerides: 3076 mg/dL — ABNORMAL HIGH (ref <150)

## 2019-02-04 MED ORDER — ACETAZOLAMIDE SODIUM 500 MG IJ SOLR
500.0000 mg | Freq: Once | INTRAMUSCULAR | Status: AC
  Administered 2019-02-04: 500 mg via INTRAVENOUS
  Filled 2019-02-04: qty 500

## 2019-02-04 MED ORDER — POTASSIUM CHLORIDE 20 MEQ/15ML (10%) PO SOLN
40.0000 meq | Freq: Once | ORAL | Status: AC
  Administered 2019-02-04: 40 meq
  Filled 2019-02-04: qty 30

## 2019-02-04 MED ORDER — INSULIN DETEMIR 100 UNIT/ML ~~LOC~~ SOLN
70.0000 [IU] | Freq: Two times a day (BID) | SUBCUTANEOUS | Status: DC
  Administered 2019-02-04 – 2019-02-11 (×15): 70 [IU] via SUBCUTANEOUS
  Filled 2019-02-04 (×16): qty 0.7

## 2019-02-04 MED ORDER — POLYETHYLENE GLYCOL 3350 17 G PO PACK
17.0000 g | PACK | Freq: Two times a day (BID) | ORAL | Status: DC
  Administered 2019-02-04 – 2019-02-08 (×8): 17 g
  Filled 2019-02-04 (×8): qty 1

## 2019-02-04 MED ORDER — DOCUSATE SODIUM 50 MG/5ML PO LIQD
100.0000 mg | Freq: Two times a day (BID) | ORAL | Status: DC
  Administered 2019-02-04 – 2019-02-08 (×9): 100 mg via ORAL
  Filled 2019-02-04 (×9): qty 10

## 2019-02-04 NOTE — Progress Notes (Addendum)
NAME:  Anita Hunter, MRN:  [id number], DOB:  1991-02-13, LOS: 9 ADMISSION DATE:  01/26/2019, CONSULTATION DATE:  01/31/19 REFERRING MD:  Avon Gully, CHIEF COMPLAINT:  breathlessness   Brief History   28 year old woman with severe OSA supposed to be on home BIPAP (issues with mask leaks), DM2, seasonal allergies, chronic back pain presenting with URI symptoms who has developed significant COVID pneumonia.  Transferred to ICU 12/19 for abrupt worsening O2 sats.  Subsequently intubated 12/20.   Past Medical History  DM2 Seasonal allergies Mild intermittent asthma OSA/OHS with recommended BIPAP 24/19 cm H2O Chronic back pain  Significant Hospital Events   01/26/19 admitted 01/31/19 transferred to ICU 02/01/19 intubated  Consults:  PCCM  Procedures:  ETT 12/20 >>  RUE PICC 12/19 >>   Significant Diagnostic Tests:  LE Venous Duplex 12/21 >>   Micro Data:  COVID 12/14 >> positive HIV 12/14 >> neg  Antimicrobials:  Actemra 12/17 Remdesivir 12/16 >>12/20    Interim history/subjective:  Tmax 99.1 I/O - 3.4L UOP, 1.1L neg for 24h Glucose - 160-290 PEEP 12, FiO2 50%, Peak 36, Pplat 30 Improved with sedation change from 12/22, no paralytics since 12/21  Objective   Blood pressure 117/63, pulse 83, temperature 99.1 F (37.3 C), resp. rate (!) 30, height 5' 9"  (1.753 m), weight (!) 153.2 kg, SpO2 94 %.    Vent Mode: PRVC FiO2 (%):  [50 %-60 %] 50 % Set Rate:  [30 bmp] 30 bmp Vt Set:  [400 mL] 400 mL PEEP:  [12 cmH20-16 cmH20] 12 cmH20 Plateau Pressure:  [26 cmH20-30 cmH20] 26 cmH20   Intake/Output Summary (Last 24 hours) at 02/04/2019 0756 Last data filed at 02/04/2019 2440 Gross per 24 hour  Intake --  Output 5275 ml  Net -5275 ml   Filed Weights   01/26/19 2050 01/31/19 0900 02/02/19 0500  Weight: (!) 164 kg (!) 158.3 kg (!) 153.2 kg    Examination: General: critically ill appearing young adult female lying in bed in NAD on vent   HEENT: MM pink/moist,  ETT, anicteric, pupils =/reactive Neuro: sedate  CV: s1s2 RRR, no m/r/g PULM:  Non-labored/synchronous GI: soft, bsx4 active  Extremities: warm/dry, trace BLE pitting edema  Skin: no rashes or lesions  CXR - 12/23 images personally reviewed, improved bilateral infiltrates, ETT in good position  Resolved Hospital Problem list      Assessment & Plan:   Acute hypoxemic respiratory failure due to COVID pneumonia Complicated by underlying OHS, severe anxiety, and possible contributing element of fluid overload.  She has asthma but does not sound in flare.  Completed remdesivir.  -low Vt ventilation 4-8cc/kg -goal plateau pressure <30, driving pressure <10 cm H2O -target PaO2 55-65, titrate PEEP/FiO2 per ARDS protocol  -goal CVP <4 -lasix 40 mg Q8 (8 total scheduled doses) -VAP prevention measures  -follow intermittent CXR  -decadron    Sedation Needs on Mechanical Ventilation  -continue dilaudid, ketamine, precedex -RASS Goal -3 to -4   -continue oral oxycodone Q4  DM II Hgb A1c 11.1 -increase levemir to 70 units BID  -TF coverage 12 units Q4 -glucose goal 140-180 -continue linagliptin   AKI Hypokalemia  -Trend BMP / urinary output -lasix as above -diamox 516m IV x1 -Replace electrolytes as indicated, KCL40 mEq PT x1 -Avoid nephrotoxic agents, ensure adequate renal perfusion  Hypertriglyceridemia Did not tolerate propofol, trigs >4K -follow triglycerides, improving 12/23 -monitor abd exam -lipase 65 on 12/23   Best practice:  Diet: TF Pain/Anxiety/Delirium protocol (if  indicated): PAD as above VAP protocol (if indicated): ordered DVT prophylaxis: Lovenox 0.105m/kg BID  GI prophylaxis: pepcid Glucose control: SSI Mobility: BR Code Status: full code Family Communication: Mother called for 12/23 for update.  Message left for return call.  Disposition: ICU  Critical care time: 30 minutes     BNoe Gens MSN, NP-C [facility] 02/04/2019, 7:56 AM   Please see [email or web address] for pager details.

## 2019-02-05 DIAGNOSIS — J80 Acute respiratory distress syndrome: Secondary | ICD-10-CM

## 2019-02-05 LAB — GLUCOSE, CAPILLARY
Glucose-Capillary: 238 mg/dL — ABNORMAL HIGH (ref 70–99)
Glucose-Capillary: 149 mg/dL — ABNORMAL HIGH (ref 70–99)
Glucose-Capillary: 170 mg/dL — ABNORMAL HIGH (ref 70–99)
Glucose-Capillary: 178 mg/dL — ABNORMAL HIGH (ref 70–99)
Glucose-Capillary: 255 mg/dL — ABNORMAL HIGH (ref 70–99)
Glucose-Capillary: 244 mg/dL — ABNORMAL HIGH (ref 70–99)

## 2019-02-05 LAB — BASIC METABOLIC PANEL
Anion gap: 11 (ref 5–15)
BUN: 32 mg/dL — ABNORMAL HIGH (ref 6–20)
CO2: 33 mmol/L — ABNORMAL HIGH (ref 22–32)
Calcium: 6.8 mg/dL — ABNORMAL LOW (ref 8.9–10.3)
Chloride: 98 mmol/L (ref 98–111)
Creatinine, Ser: 0.85 mg/dL (ref 0.44–1.00)
GFR calc Af Amer: >60 mL/min (ref >60)
GFR calc non Af Amer: >60 mL/min (ref >60)
Glucose, Bld: 164 mg/dL — ABNORMAL HIGH (ref 70–99)
Potassium: 3.4 mmol/L — ABNORMAL LOW (ref 3.5–5.1)
Sodium: 142 mmol/L (ref 135–145)

## 2019-02-05 LAB — TRIGLYCERIDES: Triglycerides: 1400 mg/dL — ABNORMAL HIGH (ref <150)

## 2019-02-05 LAB — PHOSPHORUS: Phosphorus: 3.6 mg/dL (ref 2.5–4.6)

## 2019-02-05 LAB — MAGNESIUM: Magnesium: 2.3 mg/dL (ref 1.7–2.4)

## 2019-02-05 MED ORDER — POTASSIUM CHLORIDE 20 MEQ/15ML (10%) PO SOLN
40.0000 meq | Freq: Once | ORAL | Status: AC
  Administered 2019-02-05: 40 meq
  Filled 2019-02-05: qty 30

## 2019-02-05 MED ORDER — CHLORHEXIDINE GLUCONATE CLOTH 2 % EX PADS
6.0000 | MEDICATED_PAD | Freq: Every day | CUTANEOUS | Status: DC
  Administered 2019-02-06 – 2019-02-20 (×18): 6 via TOPICAL

## 2019-02-05 MED ORDER — SODIUM CHLORIDE 0.9 % IV SOLN
0.5000 mg/h | INTRAVENOUS | Status: DC
  Administered 2019-02-06: 15 mg/h via INTRAVENOUS
  Administered 2019-02-06: 10 mg/h via INTRAVENOUS
  Filled 2019-02-05 (×4): qty 20

## 2019-02-05 MED ORDER — INSULIN ASPART 100 UNIT/ML ~~LOC~~ SOLN
14.0000 [IU] | SUBCUTANEOUS | Status: DC
  Administered 2019-02-05 – 2019-02-11 (×34): 14 [IU] via SUBCUTANEOUS

## 2019-02-05 MED ORDER — FUROSEMIDE 10 MG/ML IJ SOLN
60.0000 mg | Freq: Once | INTRAMUSCULAR | Status: AC
  Administered 2019-02-05: 60 mg via INTRAVENOUS
  Filled 2019-02-05: qty 6

## 2019-02-05 NOTE — Progress Notes (Signed)
Patient placed in prone position.  ETT secured with cloth tape at  26 cm at the lip.  Patient tolerated well with no complications.

## 2019-02-05 NOTE — Progress Notes (Signed)
NAME:  Anita Hunter, MRN:  [id number], DOB:  1990/06/22, LOS: 59 ADMISSION DATE:  01/26/2019, CONSULTATION DATE:  01/31/19 REFERRING MD:  Avon Gully, CHIEF COMPLAINT:  breathlessness   Brief History   28 year old woman with severe OSA supposed to be on home BIPAP (issues with mask leaks), DM2, seasonal allergies, chronic back pain presenting with URI symptoms who has developed significant COVID pneumonia.  Transferred to ICU 12/19 for abrupt worsening O2 sats.  Subsequently intubated 12/20.   Past Medical History  DM2 Seasonal allergies Mild intermittent asthma OSA/OHS with recommended BIPAP 24/19 cm H2O Chronic back pain  Significant Hospital Events   12/14 admitted 12/19 transferred to ICU 12/20 intubated  Consults:  PCCM  Procedures:  ETT 12/20 >>  RUE PICC 12/19 >>   Significant Diagnostic Tests:  LE Venous Duplex 12/21 >> negative for DVT bilaterally  Micro Data:  COVID 12/14 >> positive HIV 12/14 >> neg  Antimicrobials:  Actemra 12/17 Remdesivir 12/16 >> 12/20    Interim history/subjective:  Tmax 100.6 I/O - UOP 3.3L, +833 for 24h, net even for admit  Glucose range - 143 to 220  PEEP 14 / FiO2 60%, Peak 35, Pplat 31 RN notes rise in temp as above, desaturation with minimal stimulation   Objective   Blood pressure 128/70, pulse 87, temperature (!) 100.6 F (38.1 C), resp. rate (!) 22, height 5' 9"  (1.753 m), weight (!) 154.9 kg, SpO2 93 %.    Vent Mode: PCV FiO2 (%):  [55 %-60 %] 60 % Set Rate:  [22 bmp] 22 bmp PEEP:  [14 cmH20] 14 cmH20 Plateau Pressure:  [29 cmH20-31 cmH20] 31 cmH20   Intake/Output Summary (Last 24 hours) at 02/05/2019 1432 Last data filed at 02/05/2019 1400 Gross per 24 hour  Intake 4831.83 ml  Output 2970 ml  Net 1861.83 ml   Filed Weights   01/31/19 0900 02/02/19 0500 02/05/19 0449  Weight: (!) 158.3 kg (!) 153.2 kg (!) 154.9 kg    Examination: General: critically ill appearing adult female lying in bed in NAD on vent    HEENT: MM pink/moist, ETT Neuro: sedate  CV: s1s2 RRR, no m/r/g PULM: non-labored / synchronous, lungs bilaterally distant, coarse GI: soft, bsx4 active  Extremities: warm/dry, no overt edema but difficult to assess due to body habitus   Skin: no rashes or lesions  Resolved Hospital Problem list      Assessment & Plan:   Acute hypoxemic respiratory failure due to COVID pneumonia Complicated by underlying OHS, severe anxiety, and possible contributing element of fluid overload.  She has asthma but does not sound in flare.  Completed remdesivir.  -low Vt ventilation 4-8cc/kg -goal plateau pressure <30, driving pressure <96 cm H2O -target PaO2 55-65, titrate PEEP/FiO2 per ARDS protocol  -P/F ratio <150, repeat prone therapy for 16 hours  -goal CVP <4, diuresis as necessary -VAP prevention measures  -follow intermittent CXR  -decadron D5/9  Sedation Needs on Mechanical Ventilation  -continue dilaudid, precedex, ketamine  -PT oxycodone, klonopin    DM II Hgb A1c 11.1 -continue levemir 70 units BID -increase TF coverage to 14 units Q4 -continue linagliptin  -glucose goal 140-180 while in ICU   AKI Hypokalemia  -Lasix 60 mg x1 with KCL  -Trend BMP / urinary output -Replace electrolytes as indicated, KCL 40 mEq PT -Avoid nephrotoxic agents, ensure adequate renal perfusion  Hypertriglyceridemia Did not tolerate propofol, trigs >4K.  Lipase negative.  -triglycerides improving  -follow abd exam    Constipation  -  continue miralax BID, senokot   Best practice:  Diet: TF Pain/Anxiety/Delirium protocol (if indicated): PAD as above VAP protocol (if indicated): ordered DVT prophylaxis: Lovenox 0.48m/kg BID  GI prophylaxis: pepcid Glucose control: SSI Mobility: BR Code Status: full code Family Communication: Mother called for 12/225f update, no answer.  Message left for return call.  Disposition: ICU  Critical care time: 30 minutes     BrNoe GensMSN,  NP-C [facility] 02/05/2019, 2:32 PM   Please see [email or web address] for pager details.

## 2019-02-05 NOTE — Plan of Care (Signed)
  Problem: Education: Goal: Knowledge of General Education information will improve Description: Including pain rating scale, medication(s)/side effects and non-pharmacologic comfort measures Outcome: Progressing   Problem: Health Behavior/Discharge Planning: Goal: Ability to manage health-related needs will improve Outcome: Progressing   Problem: Clinical Measurements: Goal: Ability to maintain clinical measurements within normal limits will improve Outcome: Progressing Goal: Will remain free from infection Outcome: Progressing Goal: Diagnostic test results will improve Outcome: Progressing Goal: Respiratory complications will improve Outcome: Progressing Goal: Cardiovascular complication will be avoided Outcome: Progressing   Problem: Activity: Goal: Risk for activity intolerance will decrease Outcome: Progressing   Problem: Nutrition: Goal: Adequate nutrition will be maintained Outcome: Progressing   Problem: Coping: Goal: Level of anxiety will decrease Outcome: Progressing   Problem: Elimination: Goal: Will not experience complications related to bowel motility Outcome: Progressing Goal: Will not experience complications related to urinary retention Outcome: Progressing   Problem: Pain Managment: Goal: General experience of comfort will improve Outcome: Progressing   Problem: Safety: Goal: Ability to remain free from injury will improve Outcome: Progressing   Problem: Skin Integrity: Goal: Risk for impaired skin integrity will decrease Outcome: Progressing   Problem: Respiratory: Goal: Will maintain a patent airway Outcome: Progressing Goal: Complications related to the disease process, condition or treatment will be avoided or minimized Outcome: Progressing   Problem: Respiratory: Goal: Ability to maintain a clear airway and adequate ventilation will improve Outcome: Progressing

## 2019-02-05 NOTE — Progress Notes (Signed)
Called and updated patient's mother. Answered all questions. Requested FaceTime. Called via FaceTime about 10 minutes later so that patient's family could see her and talk to her.

## 2019-02-06 ENCOUNTER — Inpatient Hospital Stay (HOSPITAL_COMMUNITY): Payer: BC Managed Care – PPO

## 2019-02-06 DIAGNOSIS — R0600 Dyspnea, unspecified: Secondary | ICD-10-CM

## 2019-02-06 DIAGNOSIS — Z6841 Body Mass Index (BMI) 40.0 and over, adult: Secondary | ICD-10-CM

## 2019-02-06 DIAGNOSIS — E781 Pure hyperglyceridemia: Secondary | ICD-10-CM

## 2019-02-06 LAB — COMPREHENSIVE METABOLIC PANEL
ALT: 76 U/L — ABNORMAL HIGH (ref 0–44)
AST: 135 U/L — ABNORMAL HIGH (ref 15–41)
Albumin: 3.2 g/dL — ABNORMAL LOW (ref 3.5–5.0)
Alkaline Phosphatase: 52 U/L (ref 38–126)
Anion gap: 11 (ref 5–15)
BUN: 40 mg/dL — ABNORMAL HIGH (ref 6–20)
CO2: 35 mmol/L — ABNORMAL HIGH (ref 22–32)
Calcium: 7.4 mg/dL — ABNORMAL LOW (ref 8.9–10.3)
Chloride: 101 mmol/L (ref 98–111)
Creatinine, Ser: 0.89 mg/dL (ref 0.44–1.00)
GFR calc Af Amer: >60 mL/min (ref >60)
GFR calc non Af Amer: >60 mL/min (ref >60)
Glucose, Bld: 99 mg/dL (ref 70–99)
Potassium: 4.1 mmol/L (ref 3.5–5.1)
Sodium: 147 mmol/L — ABNORMAL HIGH (ref 135–145)
Total Bilirubin: 0.7 mg/dL (ref 0.3–1.2)
Total Protein: 6.7 g/dL (ref 6.5–8.1)

## 2019-02-06 LAB — POCT I-STAT 7, (LYTES, BLD GAS, ICA,H+H)
Acid-Base Excess: 10 mmol/L — ABNORMAL HIGH (ref 0.0–2.0)
Bicarbonate: 37.8 mmol/L — ABNORMAL HIGH (ref 20.0–28.0)
Calcium, Ion: 1.02 mmol/L — ABNORMAL LOW (ref 1.15–1.40)
HCT: 43 % (ref 36.0–46.0)
Hemoglobin: 14.6 g/dL (ref 12.0–15.0)
O2 Saturation: 93 %
Patient temperature: 38.7
Potassium: 3.8 mmol/L (ref 3.5–5.1)
Sodium: 145 mmol/L (ref 135–145)
TCO2: 40 mmol/L — ABNORMAL HIGH (ref 22–32)
pCO2 arterial: 65.8 mmHg (ref 32.0–48.0)
pH, Arterial: 7.374 (ref 7.350–7.450)
pO2, Arterial: 77 mmHg — ABNORMAL LOW (ref 83.0–108.0)

## 2019-02-06 LAB — POCT I-STAT EG7
Acid-Base Excess: 7 mmol/L — ABNORMAL HIGH (ref 0.0–2.0)
Bicarbonate: 36.5 mmol/L — ABNORMAL HIGH (ref 20.0–28.0)
Calcium, Ion: 1 mmol/L — ABNORMAL LOW (ref 1.15–1.40)
HCT: 44 % (ref 36.0–46.0)
Hemoglobin: 15 g/dL (ref 12.0–15.0)
O2 Saturation: 74 %
Patient temperature: 38
Potassium: 4 mmol/L (ref 3.5–5.1)
Sodium: 149 mmol/L — ABNORMAL HIGH (ref 135–145)
TCO2: 39 mmol/L — ABNORMAL HIGH (ref 22–32)
pCO2, Ven: 79.7 mmHg (ref 44.0–60.0)
pH, Ven: 7.273 (ref 7.250–7.430)
pO2, Ven: 49 mmHg — ABNORMAL HIGH (ref 32.0–45.0)

## 2019-02-06 LAB — TROPONIN I (HIGH SENSITIVITY): Troponin I (High Sensitivity): 11 ng/L (ref <18)

## 2019-02-06 LAB — PHOSPHORUS: Phosphorus: 3.8 mg/dL (ref 2.5–4.6)

## 2019-02-06 LAB — GLUCOSE, CAPILLARY
Glucose-Capillary: 120 mg/dL — ABNORMAL HIGH (ref 70–99)
Glucose-Capillary: 102 mg/dL — ABNORMAL HIGH (ref 70–99)
Glucose-Capillary: 141 mg/dL — ABNORMAL HIGH (ref 70–99)
Glucose-Capillary: 192 mg/dL — ABNORMAL HIGH (ref 70–99)

## 2019-02-06 LAB — ECHOCARDIOGRAM COMPLETE
Height: 69 in
Weight: 5463.88 oz

## 2019-02-06 LAB — TRIGLYCERIDES: Triglycerides: 792 mg/dL — ABNORMAL HIGH (ref <150)

## 2019-02-06 MED ORDER — SODIUM CHLORIDE 0.9 % IV SOLN
2.0000 g | Freq: Three times a day (TID) | INTRAVENOUS | Status: DC
  Administered 2019-02-06 – 2019-02-08 (×7): 2 g via INTRAVENOUS
  Filled 2019-02-06 (×7): qty 2

## 2019-02-06 MED ORDER — ACETAMINOPHEN 160 MG/5ML PO SOLN
650.0000 mg | ORAL | Status: DC | PRN
  Administered 2019-02-06 – 2019-02-11 (×12): 650 mg
  Filled 2019-02-06 (×13): qty 20.3

## 2019-02-06 MED ORDER — VANCOMYCIN HCL 1750 MG/350ML IV SOLN
1750.0000 mg | Freq: Two times a day (BID) | INTRAVENOUS | Status: DC
  Administered 2019-02-06 – 2019-02-08 (×4): 1750 mg via INTRAVENOUS
  Filled 2019-02-06 (×5): qty 350

## 2019-02-06 MED ORDER — VANCOMYCIN HCL 10 G IV SOLR
2500.0000 mg | INTRAVENOUS | Status: AC
  Administered 2019-02-06: 06:00:00 2500 mg via INTRAVENOUS
  Filled 2019-02-06: qty 2000

## 2019-02-06 MED ORDER — MIDAZOLAM 50MG/50ML (1MG/ML) PREMIX INFUSION
0.5000 mg/h | INTRAVENOUS | Status: DC
  Administered 2019-02-06 – 2019-02-07 (×4): 10 mg/h via INTRAVENOUS
  Administered 2019-02-08 (×2): 8 mg/h via INTRAVENOUS
  Administered 2019-02-08: 6 mg/h via INTRAVENOUS
  Administered 2019-02-08: 8 mg/h via INTRAVENOUS
  Administered 2019-02-08: 9 mg/h via INTRAVENOUS
  Administered 2019-02-09: 6 mg/h via INTRAVENOUS
  Administered 2019-02-09: 12 mg/h via INTRAVENOUS
  Administered 2019-02-09: 7 mg/h via INTRAVENOUS
  Administered 2019-02-10 (×2): 12 mg/h via INTRAVENOUS
  Administered 2019-02-10: 6 mg/h via INTRAVENOUS
  Filled 2019-02-06 (×16): qty 50

## 2019-02-06 MED ORDER — BISACODYL 10 MG RE SUPP
10.0000 mg | Freq: Once | RECTAL | Status: AC
  Administered 2019-02-06: 10 mg via RECTAL
  Filled 2019-02-06: qty 1

## 2019-02-06 NOTE — Progress Notes (Signed)
  Echocardiogram 2D Echocardiogram has been performed.  Anita Hunter M 02/06/2019, 1:39 PM

## 2019-02-06 NOTE — Progress Notes (Addendum)
Large volume emesis of tube feeds and gastric contents. Tube feed held, OG to ILWS  0400: temperature 103.3, Elink notified

## 2019-02-06 NOTE — Progress Notes (Signed)
Patient placed in supine position by RT x 2 and RN x 3 without complications.  ETT re-secured with a commercial tube holder at 26 in the center.

## 2019-02-06 NOTE — Progress Notes (Signed)
Patient placed in prone position at this time. ETT secured 26cm @ lip with cloth tape. Arms placed in swimmers position. Tolerated well. RN x 3, RT x2 at bedside.

## 2019-02-06 NOTE — CV Procedure (Signed)
Echocardiogram not completed patient is in the prone position.  Darlina Sicilian RDCS

## 2019-02-06 NOTE — Progress Notes (Signed)
Pharmacy Antibiotic Note  Anita Hunter is a 28 y.o. female admitted on 01/26/2019 with COVID-19 pneumonia.  Pt spiking temp up to 103.3 this morning. Pharmacy has been consulted for Vancomycin and Cefepime dosing for suspected PNA.  Plan: Cefepime 2gm IV q8h Vancomycin 2540m IV now then 17562mIV Q 12 hrs. Goal AUC 400-550. Expected AUC: 500 SCr used: 0.85; Vd coeff: 0.5 with BMI > 3 Will f/u renal function, micro data, and pt's clinical condition Vanc levels prn   Height: 5' 9"  (175.3 cm) Weight: (!) 341 lb 7.9 oz (154.9 kg) IBW/kg (Calculated) : 66.2  Temp (24hrs), Avg:100.6 F (38.1 C), Min:98.8 F (37.1 C), Max:103.3 F (39.6 C)  Recent Labs  Lab 01/31/19 0413 02/01/19 0430 02/02/19 0442 02/03/19 0500 02/04/19 0511 02/05/19 0436  WBC 9.9 12.7* 12.3* 10.7*  --   --   CREATININE 0.69 1.07* 0.63 1.43* 0.76 0.85    Estimated Creatinine Clearance: 159.6 mL/min (by C-G formula based on SCr of 0.85 mg/dL).    Allergies  Allergen Reactions  . Shellfish Allergy Nausea Only    Antimicrobials this admission: 12/25 Vanc >>  12/25 Cefepime >>   Microbiology results:  BCx:   UCx:    Trach asp:   12/20 MRSA PCR: positive  Thank you for allowing pharmacy to be a part of this patient's care.  CaSherlon HandingPharmD, BCPS Please see amion for complete clinical pharmacist phone list 02/06/2019 4:48 AM

## 2019-02-06 NOTE — Progress Notes (Signed)
[facility] Progress Note Patient Name: Anita Hunter DOB: [birth year] MRN: [id number]   Date of Service  02/06/2019  HPI/Events of Note  Fever 103 D# 5 vent /ARDS / prone  eICU Interventions  pancx Start empiric cefepime/ vanc      Intervention Category Intermediate Interventions: Other:  Leanna Sato Lajada Janes 02/06/2019, 4:47 AM

## 2019-02-06 NOTE — Progress Notes (Signed)
Family Update:  I have spoken multiple times with the family of this patient giving frequent updates to the patient's condition. They voice concern for the patient even when reassured on multiple occasions that there has been no significant changes from one call to the other. I have spoken to the family at least 5 times today and given updates and answered all questions. They were also able to FaceTime with the patient as well.

## 2019-02-06 NOTE — Progress Notes (Signed)
NAME:  Anita Hunter, MRN:  [id number], DOB:  1990/04/20, LOS: 53 ADMISSION DATE:  01/26/2019, CONSULTATION DATE:  01/31/19 REFERRING MD:  Avon Gully, CHIEF COMPLAINT:  breathlessness   Brief History   28 year old woman with severe OSA supposed to be on home BIPAP (issues with mask leaks), DM2, seasonal allergies, chronic back pain presenting with URI symptoms who has developed significant COVID pneumonia.  Transferred to ICU 12/19 for abrupt worsening O2 sats.  Subsequently intubated 12/20.   Past Medical History  DM2 Seasonal allergies Mild intermittent asthma OSA/OHS with recommended BIPAP 24/19 cm H2O Chronic back pain  Significant Hospital Events   12/14 admitted 12/19 transferred to ICU 12/20 intubated 12/25 vomiting, possible aspiration, abx initiated with fever  Consults:  PCCM  Procedures:  ETT 12/20 >>  RUE PICC 12/19 >>   Significant Diagnostic Tests:  LE Venous Duplex 12/21 >> negative for DVT bilaterally  Micro Data:  COVID 12/14 >> positive HIV 12/14 >> neg  Antimicrobials:  Actemra 12/17 Remdesivir 12/16 >> 12/20   Vanco 12/25 >>  Cefepime 12/25 >>   Interim history/subjective:  Tmax 103.3  I/O - 2.2L UOP, +880 in 24h  Glucose range - 140-255 PEEP 14, FiO2 60% Fever overnight, possible vomiting vs dislodged OGT with concern for aspiration  TF held Prone > plan for supine this afternoon  Objective   Blood pressure (!) 88/44, pulse 91, temperature (!) 103.3 F (39.6 C), resp. rate (!) 23, height 5' 9"  (1.753 m), weight (!) 154.9 kg, SpO2 92 %.    Vent Mode: PCV FiO2 (%):  [60 %] 60 % Set Rate:  [22 bmp] 22 bmp PEEP:  [14 cmH20] 14 cmH20 Plateau Pressure:  [29 cmH20-31 cmH20] 30 cmH20   Intake/Output Summary (Last 24 hours) at 02/06/2019 1451 Last data filed at 02/06/2019 1100 Gross per 24 hour  Intake 2710.21 ml  Output 2165 ml  Net 545.21 ml   Filed Weights   01/31/19 0900 02/02/19 0500 02/05/19 0449  Weight: (!) 158.3 kg (!) 153.2  kg (!) 154.9 kg    Examination: General: young adult female, critically ill appearing lying in bed in NAD, prone  HEENT: MM pink/moist, ETT Neuro: sedate  CV: s1s2 RRR, no m/r/g PULM:  Non-labored, synchronous, lungs bilaterally clear, diminished bases  GI: soft, bsx4 active  Extremities: warm/dry, trace LE edema  Skin: no rashes or lesions  Resolved Hospital Problem list      Assessment & Plan:   Acute hypoxemic respiratory failure due to COVID pneumonia Complicated by underlying OHS, severe anxiety, and possible contributing element of fluid overload.  She has asthma but does not sound in flare.  Completed remdesivir.  -low Vt ventilation 4-8cc/kg -goal plateau pressure <30, driving pressure <79 cm H2O -target PaO2 55-65, titrate PEEP/FiO2 per ARDS protocol  -if P/F ratio <150, consider prone therapy for 16 hours per day -goal CVP <4, diuresis as necessary -VAP prevention measures  -follow intermittent CXR  -decadron D6/9  Sedation Needs on Mechanical Ventilation  -continue dilaudid, ketamine, versed -wean precedex to off  -PT oxycodone, klonopin  Vomiting vs Dislodged OGT  Fever Episode 12/25 with possible vomiting vs dislodged OGT with concern for aspiration  -assess CXR now -empiric abx initiated 12/25 -if further emesis assess KUB -replace OGT to LIS for 4 hours then clamp -ok to re-challenge TF this evening   DM II Hgb A1c 11.1 -levemir 70 units BID -TF coverage 14 units Q4 -continue linagliptin  AKI Hypokalemia  -Trend BMP /  urinary output -Replace electrolytes as indicated -Avoid nephrotoxic agents, ensure adequate renal perfusion  Hypertriglyceridemia Did not tolerate propofol, trigs >4K.  Lipase negative.  -follow, triglycerides clearing  -follow abd exam   Constipation  -continue miralax BID, senokot   Best practice:  Diet: TF Pain/Anxiety/Delirium protocol (if indicated): PAD as above VAP protocol (if indicated): ordered DVT prophylaxis:  Lovenox 0.77m/kg BID  GI prophylaxis: pepcid Glucose control: SSI Mobility: BR Code Status: full code Family Communication: Mother called 12/25 for update, no answer.  Message left for return call.   Disposition: ICU  Critical care time: 30 minutes     BNoe Gens MSN, NP-C [facility] 02/06/2019, 2:51 PM   Please see [email or web address] for pager details.

## 2019-02-07 ENCOUNTER — Inpatient Hospital Stay (HOSPITAL_COMMUNITY): Payer: BC Managed Care – PPO

## 2019-02-07 DIAGNOSIS — K7581 Nonalcoholic steatohepatitis (NASH): Secondary | ICD-10-CM

## 2019-02-07 DIAGNOSIS — G4733 Obstructive sleep apnea (adult) (pediatric): Secondary | ICD-10-CM

## 2019-02-07 LAB — POCT I-STAT 7, (LYTES, BLD GAS, ICA,H+H)
Acid-Base Excess: 8 mmol/L — ABNORMAL HIGH (ref 0.0–2.0)
Bicarbonate: 34.4 mmol/L — ABNORMAL HIGH (ref 20.0–28.0)
Calcium, Ion: 1.1 mmol/L — ABNORMAL LOW (ref 1.15–1.40)
HCT: 41 % (ref 36.0–46.0)
Hemoglobin: 13.9 g/dL (ref 12.0–15.0)
O2 Saturation: 98 %
Patient temperature: 38.5
Potassium: 4.5 mmol/L (ref 3.5–5.1)
Sodium: 147 mmol/L — ABNORMAL HIGH (ref 135–145)
TCO2: 36 mmol/L — ABNORMAL HIGH (ref 22–32)
pCO2 arterial: 58.9 mmHg — ABNORMAL HIGH (ref 32.0–48.0)
pH, Arterial: 7.381 (ref 7.350–7.450)
pO2, Arterial: 117 mmHg — ABNORMAL HIGH (ref 83.0–108.0)

## 2019-02-07 LAB — CBC
HCT: 46.8 % — ABNORMAL HIGH (ref 36.0–46.0)
Hemoglobin: 13.2 g/dL (ref 12.0–15.0)
MCH: 27.2 pg (ref 26.0–34.0)
MCHC: 28.2 g/dL — ABNORMAL LOW (ref 30.0–36.0)
MCV: 96.5 fL (ref 80.0–100.0)
Platelets: 203 10*3/uL (ref 150–400)
RBC: 4.85 MIL/uL (ref 3.87–5.11)
RDW: 13.3 % (ref 11.5–15.5)
WBC: 15.1 10*3/uL — ABNORMAL HIGH (ref 4.0–10.5)
nRBC: 0 % (ref 0.0–0.2)

## 2019-02-07 LAB — BASIC METABOLIC PANEL
Anion gap: 12 (ref 5–15)
BUN: 42 mg/dL — ABNORMAL HIGH (ref 6–20)
CO2: 31 mmol/L (ref 22–32)
Calcium: 7.7 mg/dL — ABNORMAL LOW (ref 8.9–10.3)
Chloride: 105 mmol/L (ref 98–111)
Creatinine, Ser: 0.83 mg/dL (ref 0.44–1.00)
GFR calc Af Amer: >60 mL/min (ref >60)
GFR calc non Af Amer: >60 mL/min (ref >60)
Glucose, Bld: 196 mg/dL — ABNORMAL HIGH (ref 70–99)
Potassium: 4.1 mmol/L (ref 3.5–5.1)
Sodium: 148 mmol/L — ABNORMAL HIGH (ref 135–145)

## 2019-02-07 LAB — GLUCOSE, CAPILLARY
Glucose-Capillary: 172 mg/dL — ABNORMAL HIGH (ref 70–99)
Glucose-Capillary: 147 mg/dL — ABNORMAL HIGH (ref 70–99)
Glucose-Capillary: 175 mg/dL — ABNORMAL HIGH (ref 70–99)
Glucose-Capillary: 238 mg/dL — ABNORMAL HIGH (ref 70–99)
Glucose-Capillary: 328 mg/dL — ABNORMAL HIGH (ref 70–99)
Glucose-Capillary: 245 mg/dL — ABNORMAL HIGH (ref 70–99)
Glucose-Capillary: 228 mg/dL — ABNORMAL HIGH (ref 70–99)

## 2019-02-07 LAB — VANCOMYCIN, PEAK: Vancomycin Pk: 35 ug/mL (ref 30–40)

## 2019-02-07 MED ORDER — FREE WATER
200.0000 mL | Freq: Four times a day (QID) | Status: DC
  Administered 2019-02-07 – 2019-02-08 (×5): 200 mL

## 2019-02-07 MED ORDER — FUROSEMIDE 10 MG/ML IJ SOLN
40.0000 mg | Freq: Four times a day (QID) | INTRAMUSCULAR | Status: AC
  Administered 2019-02-07 – 2019-02-08 (×2): 40 mg via INTRAVENOUS
  Filled 2019-02-07 (×2): qty 4

## 2019-02-07 MED ORDER — MAGNESIUM HYDROXIDE 400 MG/5ML PO SUSP
15.0000 mL | Freq: Every day | ORAL | Status: DC
  Administered 2019-02-08 – 2019-02-19 (×5): 15 mL via ORAL
  Filled 2019-02-07 (×8): qty 30

## 2019-02-07 NOTE — Progress Notes (Signed)
RT NOTE:  RT assisted with turning patients head to the right. ETT stable throughout.

## 2019-02-07 NOTE — Progress Notes (Signed)
Attempted to call mother to give update because she was expecting a call during the unexpected downtime we experienced.  I left a voicemail requesting her to call me back when possible.

## 2019-02-07 NOTE — Progress Notes (Signed)
NAME:  Anita Hunter, MRN:  [id number], DOB:  13-Jan-1991, LOS: 12 ADMISSION DATE:  01/26/2019, CONSULTATION DATE:  01/31/19 REFERRING MD:  Avon Gully, CHIEF COMPLAINT:  breathlessness   Brief History   28 year old woman with severe OSA supposed to be on home BIPAP (issues with mask leaks), DM2, seasonal allergies, chronic back pain presenting with URI symptoms who has developed significant COVID pneumonia.  Transferred to ICU 12/19 for abrupt worsening O2 sats.  Subsequently intubated 12/20.   Past Medical History  DM2 Seasonal allergies Mild intermittent asthma OSA/OHS with recommended BIPAP 24/19 cm H2O Chronic back pain  Significant Hospital Events   12/14 admitted 12/19 transferred to ICU 12/20 intubated 12/25 vomiting, possible aspiration, abx initiated with fever  Consults:  PCCM  Procedures:  ETT 12/20 >>  RUE PICC 12/19 >>   Significant Diagnostic Tests:  LE Venous Duplex 12/21 >> negative for DVT bilaterally  Micro Data:  COVID 12/14 >> positive HIV 12/14 >> neg 12/25 resp culture > GPC 12/25 blood culture >   Antimicrobials:  Actemra 12/17 Remdesivir 12/16 >> 12/20   Vanco 12/25 >>  Cefepime 12/25 >>   Interim history/subjective:   Fever, tachycardia GPC in phlegm  Objective   Blood pressure (!) 141/73, pulse (!) 118, temperature (!) 101.5 F (38.6 C), resp. rate (!) 22, height 5' 9"  (1.753 m), weight (!) 141.6 kg, SpO2 96 %.    Vent Mode: PCV FiO2 (%):  [40 %-60 %] 40 % Set Rate:  [22 bmp] 22 bmp PEEP:  [14 cmH20] 14 cmH20 Plateau Pressure:  [27 cmH20-32 cmH20] 27 cmH20   Intake/Output Summary (Last 24 hours) at 02/07/2019 1147 Last data filed at 02/07/2019 1100 Gross per 24 hour  Intake 2976.73 ml  Output 2010 ml  Net 966.73 ml   Filed Weights   02/02/19 0500 02/05/19 0449 02/07/19 0500  Weight: (!) 153.2 kg (!) 154.9 kg (!) 141.6 kg    Examination:  General:  In bed on vent, prone HENT: NCAT ETT in place PULM: Crackles bases B,  vent supported breathing CV: prone GI: prone MSK: normal bulk and tone Neuro: sedated on vent   Resolved Hospital Problem list      Assessment & Plan:   Acute hypoxemic respiratory failure due to COVID pneumonia Complicated by HCAP now Continue mechanical ventilation per ARDS protocol Target TVol 6-8cc/kgIBW Target Plateau Pressure < 30cm H20 Target driving pressure less than 15 cm of water Target PaO2 55-65: titrate PEEP/FiO2 per protocol As long as PaO2 to FiO2 ratio is less than 1:150 position in prone position for 16 hours a day Check CVP daily if CVL in place Target CVP less than 4, diurese as necessary Ventilator associated pneumonia prevention protocol 12/26 decadron day 7, check supine gas, check CVP, no diurese   Sedation Needs on Mechanical Ventilation : high Ketamine, versed Restart dilaudid and try to wean off versed Oxycodone and clonazepam  HCAP Vanc, cefepime F/u resp culture  DM II with hyperglycemia Continue levemir and SSI and tube feeding and linagliptin  AKI > resolved Hypokalemia > resolved Monitor BMET and UOP Replace electrolytes as needed    Constipation > resolved -hold bowel regimen   Best practice:  Diet: TF Pain/Anxiety/Delirium protocol (if indicated): PAD as above VAP protocol (if indicated): ordered DVT prophylaxis: Lovenox 0.17m/kg BID  GI prophylaxis: pepcid Glucose control: SSI Mobility: BR Code Status: full code Family Communication: will call family today Disposition: ICU  Critical care time: 33 minutes  Roselie Awkward, MD [facility] Pager: [phone] Cell: [phone] If no response, call [phone]

## 2019-02-07 NOTE — Progress Notes (Signed)
RT NOTE:  RT assisted with turning patients head to the left. ETT secure throughout.

## 2019-02-07 NOTE — Progress Notes (Signed)
Updated mother, Mardene Celeste, about the patient's condition.  All questions answered and will give update again around midnight per her request.

## 2019-02-07 NOTE — Plan of Care (Signed)
Patient did well today; Continued with low grade fever 38.2-38.9 throughout shift. HR ST in the 120s at start of shift, but has been NSR in the 90s for the last hour. Supined at 1500, ABG at 1700, will no longer need to be proned. Tolerated vent settings and cares well today. Updated mother Anita Hunter) x 3 today and sister Anita Hunter) x 1 today.   Problem: Education: Goal: Knowledge of General Education information will improve Description: Including pain rating scale, medication(s)/side effects and non-pharmacologic comfort measures Outcome: Progressing   Problem: Health Behavior/Discharge Planning: Goal: Ability to manage health-related needs will improve Outcome: Progressing   Problem: Clinical Measurements: Goal: Ability to maintain clinical measurements within normal limits will improve Outcome: Progressing Goal: Will remain free from infection Outcome: Progressing Goal: Diagnostic test results will improve Outcome: Progressing Goal: Respiratory complications will improve Outcome: Progressing Goal: Cardiovascular complication will be avoided Outcome: Progressing   Problem: Activity: Goal: Risk for activity intolerance will decrease Outcome: Progressing   Problem: Nutrition: Goal: Adequate nutrition will be maintained Outcome: Progressing   Problem: Coping: Goal: Level of anxiety will decrease Outcome: Progressing   Problem: Elimination: Goal: Will not experience complications related to bowel motility Outcome: Progressing Goal: Will not experience complications related to urinary retention Outcome: Progressing   Problem: Pain Managment: Goal: General experience of comfort will improve Outcome: Progressing   Problem: Safety: Goal: Ability to remain free from injury will improve Outcome: Progressing   Problem: Skin Integrity: Goal: Risk for impaired skin integrity will decrease Outcome: Progressing   Problem: Respiratory: Goal: Will maintain a patent  airway Outcome: Progressing Goal: Complications related to the disease process, condition or treatment will be avoided or minimized Outcome: Progressing   Problem: Respiratory: Goal: Ability to maintain a clear airway and adequate ventilation will improve Outcome: Progressing

## 2019-02-07 NOTE — Progress Notes (Signed)
[facility] PCCM  I called Mardene Celeste (mom) for an update.  Roselie Awkward, MD [place] [facility] Pager: [phone] Cell: [phone] If no response, call [phone]

## 2019-02-07 NOTE — Progress Notes (Signed)
[facility] PCCM Evening rounds  PF ratio improved, no prone needed  Roselie Awkward, MD [place] Pager: [phone] Cell: [phone] If no response, call [phone]

## 2019-02-07 NOTE — Progress Notes (Signed)
Patient placed in supine position by RN x 3 and RT x 3 without complications. ETT re-secured with a commercial tube holder at 26.

## 2019-02-08 ENCOUNTER — Inpatient Hospital Stay (HOSPITAL_COMMUNITY): Payer: BC Managed Care – PPO

## 2019-02-08 LAB — GLUCOSE, CAPILLARY
Glucose-Capillary: 140 mg/dL — ABNORMAL HIGH (ref 70–99)
Glucose-Capillary: 184 mg/dL — ABNORMAL HIGH (ref 70–99)
Glucose-Capillary: 186 mg/dL — ABNORMAL HIGH (ref 70–99)
Glucose-Capillary: 187 mg/dL — ABNORMAL HIGH (ref 70–99)
Glucose-Capillary: 204 mg/dL — ABNORMAL HIGH (ref 70–99)
Glucose-Capillary: 314 mg/dL — ABNORMAL HIGH (ref 70–99)

## 2019-02-08 LAB — COMPREHENSIVE METABOLIC PANEL
ALT: 62 U/L — ABNORMAL HIGH (ref 0–44)
AST: 83 U/L — ABNORMAL HIGH (ref 15–41)
Albumin: 3.2 g/dL — ABNORMAL LOW (ref 3.5–5.0)
Alkaline Phosphatase: 44 U/L (ref 38–126)
Anion gap: 16 — ABNORMAL HIGH (ref 5–15)
BUN: 42 mg/dL — ABNORMAL HIGH (ref 6–20)
CO2: 30 mmol/L (ref 22–32)
Calcium: 7.6 mg/dL — ABNORMAL LOW (ref 8.9–10.3)
Chloride: 103 mmol/L (ref 98–111)
Creatinine, Ser: 0.7 mg/dL (ref 0.44–1.00)
GFR calc Af Amer: >60 mL/min (ref >60)
GFR calc non Af Amer: >60 mL/min (ref >60)
Glucose, Bld: 197 mg/dL — ABNORMAL HIGH (ref 70–99)
Potassium: 3.9 mmol/L (ref 3.5–5.1)
Sodium: 149 mmol/L — ABNORMAL HIGH (ref 135–145)
Total Bilirubin: 0.8 mg/dL (ref 0.3–1.2)
Total Protein: 6.6 g/dL (ref 6.5–8.1)

## 2019-02-08 LAB — CBC
HCT: 44.6 % (ref 36.0–46.0)
Hemoglobin: 12.6 g/dL (ref 12.0–15.0)
MCH: 27.2 pg (ref 26.0–34.0)
MCHC: 28.3 g/dL — ABNORMAL LOW (ref 30.0–36.0)
MCV: 96.1 fL (ref 80.0–100.0)
Platelets: 179 10*3/uL (ref 150–400)
RBC: 4.64 MIL/uL (ref 3.87–5.11)
RDW: 13.5 % (ref 11.5–15.5)
WBC: 12.6 10*3/uL — ABNORMAL HIGH (ref 4.0–10.5)
nRBC: 0 % (ref 0.0–0.2)

## 2019-02-08 LAB — URINE CULTURE: Culture: >=100000 — AB

## 2019-02-08 LAB — VANCOMYCIN, TROUGH: Vancomycin Tr: 11 ug/mL — ABNORMAL LOW (ref 15–20)

## 2019-02-08 MED ORDER — FREE WATER
400.0000 mL | Freq: Four times a day (QID) | Status: DC
  Administered 2019-02-08 – 2019-02-11 (×11): 400 mL

## 2019-02-08 MED ORDER — SODIUM CHLORIDE 0.9 % IV SOLN
2.0000 g | INTRAVENOUS | Status: DC
  Administered 2019-02-08: 2 g via INTRAVENOUS
  Filled 2019-02-08 (×2): qty 20

## 2019-02-08 MED ORDER — FUROSEMIDE 10 MG/ML IJ SOLN
40.0000 mg | Freq: Four times a day (QID) | INTRAMUSCULAR | Status: AC
  Administered 2019-02-08 (×2): 40 mg via INTRAVENOUS
  Filled 2019-02-08 (×2): qty 4

## 2019-02-08 MED ORDER — VANCOMYCIN HCL IN DEXTROSE 1-5 GM/200ML-% IV SOLN
1000.0000 mg | Freq: Three times a day (TID) | INTRAVENOUS | Status: DC
  Administered 2019-02-08 – 2019-02-09 (×3): 1000 mg via INTRAVENOUS
  Filled 2019-02-08 (×5): qty 200

## 2019-02-08 MED ORDER — HYDROCOD POLST-CPM POLST ER 10-8 MG/5ML PO SUER
5.0000 mL | Freq: Two times a day (BID) | ORAL | Status: DC | PRN
  Administered 2019-02-08: 5 mL via ORAL
  Filled 2019-02-08: qty 5

## 2019-02-08 NOTE — Progress Notes (Signed)
NAME:  Anita Hunter, MRN:  [id number], DOB:  1990-03-26, LOS: 72 ADMISSION DATE:  01/26/2019, CONSULTATION DATE:  01/31/19 REFERRING MD:  Avon Gully, CHIEF COMPLAINT:  breathlessness   Brief History   28 year old woman with severe OSA supposed to be on home BIPAP (issues with mask leaks), DM2, seasonal allergies, chronic back pain presenting with URI symptoms who has developed significant COVID pneumonia.  Transferred to ICU 12/19 for abrupt worsening O2 sats.  Subsequently intubated 12/20.   Past Medical History  DM2 Seasonal allergies Mild intermittent asthma OSA/OHS with recommended BIPAP 24/19 cm H2O Chronic back pain  Significant Hospital Events   12/14 admitted 12/19 transferred to ICU 12/20 intubated 12/25 vomiting, possible aspiration, abx initiated with fever  Consults:  PCCM  Procedures:  ETT 12/20 >>  RUE PICC 12/19 >>   Significant Diagnostic Tests:  LE Venous Duplex 12/21 >> negative for DVT bilaterally  Micro Data:  COVID 12/14 >> positive HIV 12/14 >> neg 12/25 urine >  12/25 resp culture > staph aureas 12/25 blood culture >   Antimicrobials:  Actemra 12/17 Remdesivir 12/16 >> 12/20   Vanco 12/25 >>  Cefepime 12/25 >> 12/27 Ceftriaxone 12/27 >>  Interim history/subjective:   Oxygenation better  Objective   Blood pressure 128/65, pulse 96, temperature 99.5 F (37.5 C), resp. rate (!) 22, height 5' 9"  (1.753 m), weight (!) 145.8 kg, SpO2 (!) 88 %. CVP:  [11 mmHg] 11 mmHg  Vent Mode: PCV FiO2 (%):  [40 %] 40 % Set Rate:  [22 bmp] 22 bmp PEEP:  [14 cmH20] 14 cmH20 Plateau Pressure:  [26 cmH20-30 cmH20] 30 cmH20   Intake/Output Summary (Last 24 hours) at 02/08/2019 8242 Last data filed at 02/08/2019 0700 Gross per 24 hour  Intake 3868.75 ml  Output 3210 ml  Net 658.75 ml   Filed Weights   02/05/19 0449 02/07/19 0500 02/08/19 0500  Weight: (!) 154.9 kg (!) 141.6 kg (!) 145.8 kg    Examination:  General:  In bed on vent HENT: NCAT  ETT in place PULM: Rhonchi bilaterally B, vent supported breathing CV: RRR, no mgr GI: BS+, soft, nontender MSK: normal bulk and tone Neuro: sedated on vent  Resolved Hospital Problem list      Assessment & Plan:   Acute hypoxemic respiratory failure due to COVID pneumonia Complicated by HCAP now Continue mechanical ventilation per ARDS protocol Target TVol 6-8cc/kgIBW Target Plateau Pressure < 30cm H20 Target driving pressure less than 15 cm of water Target PaO2 55-65: titrate PEEP/FiO2 per protocol As long as PaO2 to FiO2 ratio is less than 1:150 position in prone position for 16 hours a day Check CVP daily if CVL in place Target CVP less than 4, diurese as necessary Ventilator associated pneumonia prevention protocol 12/27 start to wean sedation, wean PEEP, no prone needed, diurese today with lasix  Question of cardiomyopathy With negative troponin, doesn't look like myocarditis, cardiology think that window quality were poor for echo, may be normal Plan repeat Echo before discharge  Sedation Needs on Mechanical Ventilation : high Wean off versed over next 24 hours Continue dilaudid and ketamine RASS target -2, order today  HCAP > Staph aureus UTI > e coli Vanc to continue Change cefepime to ceftriaxone  DM II with hyperglycemia levemir and SSI linagliptin  AKI > resolved Hypokalemia > resolved Monitor BMET and UOP Replace electrolytes as needed  Constipation > resolved Hold bowel regimen   Best practice:  Diet: TF Pain/Anxiety/Delirium protocol (if indicated):  PAD as above VAP protocol (if indicated): ordered DVT prophylaxis: Lovenox 0.19m/kg BID  GI prophylaxis: pepcid Glucose control: SSI Mobility: BR Code Status: full code Family Communication: will update patricia today Disposition: ICU  Critical care time: 30 minutes     BRoselie Awkward MD [facility] Pager: [phone]: [phone] no response, call [phone]

## 2019-02-08 NOTE — Progress Notes (Signed)
[facility] PCM  Spoke to her mother, gave update. Questions answered.  Roselie Awkward, MD [place] [facility] Pager: [phone] Cell: [phone] If no response, call [phone]

## 2019-02-08 NOTE — Plan of Care (Signed)
  Problem: Education: Goal: Knowledge of General Education information will improve Description: Including pain rating scale, medication(s)/side effects and non-pharmacologic comfort measures Outcome: Progressing   Problem: Health Behavior/Discharge Planning: Goal: Ability to manage health-related needs will improve Outcome: Progressing   Problem: Clinical Measurements: Goal: Ability to maintain clinical measurements within normal limits will improve Outcome: Progressing Goal: Will remain free from infection Outcome: Progressing Goal: Diagnostic test results will improve Outcome: Progressing Goal: Respiratory complications will improve Outcome: Progressing Goal: Cardiovascular complication will be avoided Outcome: Progressing   Problem: Activity: Goal: Risk for activity intolerance will decrease Outcome: Progressing   Problem: Nutrition: Goal: Adequate nutrition will be maintained Outcome: Progressing   Problem: Coping: Goal: Level of anxiety will decrease Outcome: Progressing   Problem: Elimination: Goal: Will not experience complications related to bowel motility Outcome: Progressing Goal: Will not experience complications related to urinary retention Outcome: Progressing   Problem: Pain Managment: Goal: General experience of comfort will improve Outcome: Progressing   Problem: Safety: Goal: Ability to remain free from injury will improve Outcome: Progressing   Problem: Skin Integrity: Goal: Risk for impaired skin integrity will decrease Outcome: Progressing   Problem: Respiratory: Goal: Will maintain a patent airway Outcome: Progressing Goal: Complications related to the disease process, condition or treatment will be avoided or minimized Outcome: Progressing   Problem: Respiratory: Goal: Ability to maintain a clear airway and adequate ventilation will improve Outcome: Progressing

## 2019-02-08 NOTE — Progress Notes (Signed)
Patients mother called and updated on plan of care and patient status.  Mother verbalizes understanding of plan of care and denies further questions.

## 2019-02-08 NOTE — Progress Notes (Signed)
Pharmacy Antibiotic Note  Anita Hunter is a 28 y.o. female admitted on 01/26/2019 with COVID-19 pneumonia.  Pt spiking temp up to 103.3 this morning. Pharmacy has been consulted for Vancomycin and Cefepime dosing for suspected PNA.  A vancomycin peak and trough level were 35 and 11 respectively. Steady state AUC 549  Plan: Stop Cefepime  Start Ceftriaxone per MD for UTI Change vancomycin to 1 gm IV Q 8 hours. Goal AUC 400-550. Expected AUC: 470 SCr used: 0.0.7; Vd coeff: 0.5 with BMI > 3 Will f/u renal function, micro data, and pt's clinical condition Vanc levels prn Will de-escalate vancomycin based on staph aureus susceptibilities    Height: 5' 9"  (175.3 cm) Weight: (!) 321 lb 6.9 oz (145.8 kg) IBW/kg (Calculated) : 66.2  Temp (24hrs), Avg:100.9 F (38.3 C), Min:99.5 F (37.5 C), Max:102 F (38.9 C)  Recent Labs  Lab 02/02/19 0442 02/03/19 0500 02/04/19 0511 02/05/19 0436 02/06/19 0445 02/07/19 0500 02/07/19 2123 02/08/19 0422 02/08/19 0536  WBC 12.3* 10.7*  --   --   --  15.1*  --  12.6*  --   CREATININE 0.63 1.43* 0.76 0.85 0.89 0.83  --  0.70  --   VANCOTROUGH  --   --   --   --   --   --   --   --  11*  VANCOPEAK  --   --   --   --   --   --  35  --   --     Estimated Creatinine Clearance: 163.4 mL/min (by C-G formula based on SCr of 0.7 mg/dL).    Allergies  Allergen Reactions  . Shellfish Allergy Nausea Only    Antimicrobials this admission: 12/25 Vanc >>  12/25 Cefepime >>   Microbiology results:  BCx: ngtd   UCx:  >100k E.coli (pan-sensitive)  12/25 Trach asp: Mod staph aureus 12/20 MRSA PCR: positive  Thank you for allowing pharmacy to be a part of this patient's care.  Albertina Parr, PharmD., BCPS Clinical Pharmacist Clinical phone for 02/08/19 until 5pm: [phone]

## 2019-02-09 ENCOUNTER — Inpatient Hospital Stay (HOSPITAL_COMMUNITY): Payer: BC Managed Care – PPO

## 2019-02-09 DIAGNOSIS — Z9911 Dependence on respirator [ventilator] status: Secondary | ICD-10-CM

## 2019-02-09 DIAGNOSIS — E1165 Type 2 diabetes mellitus with hyperglycemia: Secondary | ICD-10-CM

## 2019-02-09 LAB — CBC WITH DIFFERENTIAL/PLATELET
Abs Immature Granulocytes: 0.1 10*3/uL — ABNORMAL HIGH (ref 0.00–0.07)
Basophils Absolute: 0 10*3/uL (ref 0.0–0.1)
Basophils Relative: 0 %
Eosinophils Absolute: 0.2 10*3/uL (ref 0.0–0.5)
Eosinophils Relative: 1 %
HCT: 42.9 % (ref 36.0–46.0)
Hemoglobin: 11.9 g/dL — ABNORMAL LOW (ref 12.0–15.0)
Immature Granulocytes: 1 %
Lymphocytes Relative: 23 %
Lymphs Abs: 2.6 10*3/uL (ref 0.7–4.0)
MCH: 27 pg (ref 26.0–34.0)
MCHC: 27.7 g/dL — ABNORMAL LOW (ref 30.0–36.0)
MCV: 97.5 fL (ref 80.0–100.0)
Monocytes Absolute: 1.4 10*3/uL — ABNORMAL HIGH (ref 0.1–1.0)
Monocytes Relative: 12 %
Neutro Abs: 7 10*3/uL (ref 1.7–7.7)
Neutrophils Relative %: 63 %
Platelets: 149 10*3/uL — ABNORMAL LOW (ref 150–400)
RBC: 4.4 MIL/uL (ref 3.87–5.11)
RDW: 13.7 % (ref 11.5–15.5)
WBC: 11.2 10*3/uL — ABNORMAL HIGH (ref 4.0–10.5)
nRBC: 0 % (ref 0.0–0.2)

## 2019-02-09 LAB — CULTURE, RESPIRATORY W GRAM STAIN

## 2019-02-09 LAB — BASIC METABOLIC PANEL
Anion gap: 8 (ref 5–15)
BUN: 44 mg/dL — ABNORMAL HIGH (ref 6–20)
CO2: 34 mmol/L — ABNORMAL HIGH (ref 22–32)
Calcium: 8 mg/dL — ABNORMAL LOW (ref 8.9–10.3)
Chloride: 109 mmol/L (ref 98–111)
Creatinine, Ser: 0.77 mg/dL (ref 0.44–1.00)
GFR calc Af Amer: >60 mL/min (ref >60)
GFR calc non Af Amer: >60 mL/min (ref >60)
Glucose, Bld: 177 mg/dL — ABNORMAL HIGH (ref 70–99)
Potassium: 3.8 mmol/L (ref 3.5–5.1)
Sodium: 151 mmol/L — ABNORMAL HIGH (ref 135–145)

## 2019-02-09 LAB — GLUCOSE, CAPILLARY
Glucose-Capillary: 303 mg/dL — ABNORMAL HIGH (ref 70–99)
Glucose-Capillary: 106 mg/dL — ABNORMAL HIGH (ref 70–99)
Glucose-Capillary: 169 mg/dL — ABNORMAL HIGH (ref 70–99)
Glucose-Capillary: 121 mg/dL — ABNORMAL HIGH (ref 70–99)
Glucose-Capillary: 189 mg/dL — ABNORMAL HIGH (ref 70–99)
Glucose-Capillary: 246 mg/dL — ABNORMAL HIGH (ref 70–99)
Glucose-Capillary: 177 mg/dL — ABNORMAL HIGH (ref 70–99)

## 2019-02-09 MED ORDER — ENOXAPARIN SODIUM 80 MG/0.8ML ~~LOC~~ SOLN
70.0000 mg | Freq: Two times a day (BID) | SUBCUTANEOUS | Status: DC
  Administered 2019-02-09 – 2019-02-19 (×20): 70 mg via SUBCUTANEOUS
  Filled 2019-02-09 (×20): qty 0.8

## 2019-02-09 MED ORDER — PRO-STAT SUGAR FREE PO LIQD
60.0000 mL | Freq: Two times a day (BID) | ORAL | Status: DC
  Administered 2019-02-09 – 2019-02-11 (×4): 60 mL
  Filled 2019-02-09 (×5): qty 60

## 2019-02-09 MED ORDER — CEFAZOLIN SODIUM-DEXTROSE 2-4 GM/100ML-% IV SOLN
2.0000 g | Freq: Three times a day (TID) | INTRAVENOUS | Status: AC
  Administered 2019-02-09 – 2019-02-14 (×16): 2 g via INTRAVENOUS
  Filled 2019-02-09 (×19): qty 100

## 2019-02-09 MED ORDER — VITAL 1.5 CAL PO LIQD
1000.0000 mL | ORAL | Status: DC
  Administered 2019-02-09: 1000 mL

## 2019-02-09 NOTE — Progress Notes (Signed)
Pharmacy Antibiotic Note  Anita Hunter is a 28 y.o. female admitted on 01/26/2019 with COVID-19 pneumonia.  She has been on ceftriaxone for Ecoli UTI and Vancomycin for Staph pneumonia.  Cultures show pan-sensitive Ecoli and MSSA.  Pharmacy is consulted to narrow antibiotics to Cefazolin. Afebrile (Tm 102 on 12/27 at 1800) WBC decreased to 11.2  Plan: D/c vancomycin and ceftriaxone. Cefazolin 2g IV q8h. Will f/u renal function, micro data, and pt's clinical condition   Height: 5' 9"  (175.3 cm) Weight: (!) 321 lb 6.9 oz (145.8 kg) IBW/kg (Calculated) : 66.2  Temp (24hrs), Avg:100.9 F (38.3 C), Min:98.5 F (36.9 C), Max:102 F (38.9 C)  Recent Labs  Lab 02/03/19 0500 02/05/19 0436 02/06/19 0445 02/07/19 0500 02/07/19 2123 02/08/19 0422 02/08/19 0536 02/09/19 0520  WBC 10.7*  --   --  15.1*  --  12.6*  --  11.2*  CREATININE 1.43* 0.85 0.89 0.83  --  0.70  --  0.77  VANCOTROUGH  --   --   --   --   --   --  11*  --   VANCOPEAK  --   --   --   --  35  --   --   --     Estimated Creatinine Clearance: 163.4 mL/min (by C-G formula based on SCr of 0.77 mg/dL).    Allergies  Allergen Reactions  . Shellfish Allergy Nausea Only    Antimicrobials this admission: 12/16 Remdesivir >> 12/20 12/25 Vanc >> 12/28 12/25 Cefepime >> 12/27 12/27 Ceftriaxone >> 12/28 12/28 Cefazolin>>   Microbiology results: 12/25 BCx: ngtd 12/25 UCx:  >100k E.coli (pan sensitive)  12/25 Trach asp:  moderate staph, MSSA   12/20 MRSA PCR: positive  Thank you for allowing pharmacy to be a part of this patient's care.  Gretta Arab PharmD, BCPS Clinical pharmacist phone 7am- 5pm: [phone] 02/09/2019 12:51 PM

## 2019-02-09 NOTE — Progress Notes (Signed)
Received in report that pt had 3 loose stools throughout the night. Pt had 4th at this time. Flexiseal inserted at this time per order

## 2019-02-09 NOTE — Progress Notes (Signed)
NAME:  Anita Hunter, MRN:  [id number], DOB:  [birth year], LOS: 55 ADMISSION DATE:  01/26/2019, CONSULTATION DATE:  01/31/19 REFERRING MD:  Avon Gully, CHIEF COMPLAINT:  breathlessness   Brief History   28 year old woman with severe OSA supposed to be on home BIPAP (issues with mask leaks), DM2, seasonal allergies, chronic back pain presenting with URI symptoms who has developed significant COVID pneumonia.  Transferred to ICU 12/19 for abrupt worsening O2 sats.  Subsequently intubated 12/20.   Past Medical History  DM2 Seasonal allergies Mild intermittent asthma OSA/OHS with recommended BIPAP 24/19 cm H2O Chronic back pain  Significant Hospital Events   12/14 admitted 12/19 transferred to ICU 12/20 intubated 12/25 vomiting, possible aspiration, abx initiated with fever  Consults:  PCCM  Procedures:  ETT 12/20 >>  RUE PICC 12/19 >>   Significant Diagnostic Tests:  LE Venous Duplex 12/21 >> negative for DVT bilaterally  Micro Data:  COVID 12/14 >> positive HIV 12/14 >> neg 12/25 urine >  12/25 resp culture > staph aureas 12/25 blood culture >   Antimicrobials:  Actemra 12/17 Remdesivir 12/16 >> 12/20   Vanco 12/25 >>  Cefepime 12/25 >> 12/27 Ceftriaxone 12/27 >>  Interim history/subjective:   Oxygenation improving.  Decreasing sedation requirements.  Low-grade temperature  Objective   Blood pressure (!) 186/91, pulse (!) 107, temperature (!) 100.9 F (38.3 C), temperature source Oral, resp. rate (!) 24, height 5' 9"  (1.753 m), weight (!) 145.8 kg, SpO2 94 %.    Vent Mode: PCV FiO2 (%):  [40 %] 40 % Set Rate:  [22 bmp] 22 bmp PEEP:  [14 cmH20] 14 cmH20 Plateau Pressure:  [28 cmH20-30 cmH20] 28 cmH20   Intake/Output Summary (Last 24 hours) at 02/09/2019 1823 Last data filed at 02/09/2019 1645 Gross per 24 hour  Intake 3483.84 ml  Output 2310 ml  Net 1173.84 ml   Filed Weights   02/05/19 0449 02/07/19 0500 02/08/19 0500  Weight: (!) 154.9 kg (!) 141.6  kg (!) 145.8 kg    Examination:  General:  In bed on vent HENT: NCAT ETT in place PULM: Rhonchi bilaterally B, vent supported breathing CV: RRR, no mgr GI: BS+, soft, nontender MSK: normal bulk and tone Neuro: sedated on vent  Resolved Hospital Problem list      Assessment & Plan:   Acute hypoxemic respiratory failure due to COVID pneumonia Complicated by HCAP now Continue mechanical ventilation per ARDS protocol Target TVol 6-8cc/kgIBW Target Plateau Pressure < 30cm H20 Target driving pressure less than 15 cm of water Target PaO2 55-65: titrate PEEP/FiO2 per protocol As long as PaO2 to FiO2 ratio is less than 1:150 position in prone position for 16 hours a day Check CVP daily if CVL in place Target CVP less than 4, diurese as necessary Ventilator associated pneumonia prevention protocol 12/28: weaning from vent support, diuresis, decreasing sedation if possible   Question of cardiomyopathy For echo image quality per documentation. Plans for repeat echo before discharge.  Sedation Needs on Mechanical Ventilation Wean off Versed, wean off ketamine Continue Dilaudid  HCAP > Staph aureus UTI > e coli Both are pansensitive de-escalate to Ancef  DM II with hyperglycemia Levemir plus SSI plus inagliptan  AKI > resolved Hypokalemia > resolved Continue to follow urine output and metabolic panel  Constipation > resolved now with liquid stool FMS placed today.  Best practice:  Diet: TF Pain/Anxiety/Delirium protocol (if indicated): PAD as above VAP protocol (if indicated): ordered DVT prophylaxis: Lovenox 0.14m/kg BID  GI prophylaxis: pepcid Glucose control: SSI Mobility: BR Code Status: full code Family Communication: Attempted to call mother however was forwarded to voice message each time. Disposition: ICU  This patient is critically ill with multiple organ system failure; which, requires frequent high complexity decision making, assessment, support,  evaluation, and titration of therapies. This was completed through the application of advanced monitoring technologies and extensive interpretation of multiple databases. During this encounter critical care time was devoted to patient care services described in this note for 32 minutes.  Garner Nash, DO [facility] 02/09/2019 6:23 PM

## 2019-02-09 NOTE — Progress Notes (Signed)
facetimed with family and friends, gave mother and sister an update on pt condition. Voiced understanding.

## 2019-02-09 NOTE — Progress Notes (Addendum)
Nutrition Follow-up  RD working remotely.  DOCUMENTATION CODES:   Morbid obesity  INTERVENTION:   Tube Feeding:  Vital 1.5 at 60 ml/hr Pro-Stat 60 mL BID Provides 157 g of protein, 2560 kcals, 1100 mL of free water Meets 100% of protein needs, 8100% calorie needs  NUTRITION DIAGNOSIS:   Inadequate oral intake related to acute illness as evidenced by NPO status.  Ongoing; being addressed with TF  GOAL:   Patient will meet greater than or equal to 90% of their needs  Progressing; being addressed with TF  MONITOR:   Vent status, TF tolerance, Skin, Labs, Weight trends  REASON FOR ASSESSMENT:   Ventilator, Consult Enteral/tube feeding initiation and management  ASSESSMENT:   28 yo female admitted with severe COVID pneumonia on 12/14 ultimately requiring intubation on 12/20. PMH includes DM, seasonal allergies, asthma, OSA/OHS  12/14 Admit 12/20 Intubated  12/25 TF held secondary to large volume emesis, proned 12/26 no further need for proning 12/27 start to wean sedation, wean PEEP, no prone needed, diurese today with lasix 12/28 rectal tube placed due to loose stools  Patient remains intubated on ventilator support MV: 8.3 L/min Temp (24hrs), Avg:100.9 F (38.3 C), Min:98.5 F (36.9 C), Max:102 F (38.9 C)  MAP: 76  Reviewed I/O's: +1.3 L x 24 hours and +3.4 L since admission  UOP: 3 L x 24 hours  Per MD notes yesterday, plan to wean sedation.   Pt remains on TF via OGT: Vital 1.5 @ 40 ml/hr with 60 ml Prostat TID, which provides 2040 kcals, 155 grams protein, and 730 ml free water daily, meeting 85% of estimated kcal needs and 100% of estimated protein needs.  Medications reviewed and include dilaudid, ketamine, and versed.   Labs reviewed: Na: 151, CBGS: 121-169 (inpatient orders for glycemic control are 0-20 units insulin aspart every 4 hours, 14 units insulin aspart every 4 hours, 5 mg linagliptin daily, and 70 units insulin detemir BID).   Diet  Order:   Diet Order            Diet NPO time specified  Diet effective now              EDUCATION NEEDS:   Not appropriate for education at this time  Skin:  Skin Assessment: Reviewed RN Assessment  Last BM:  02/09/19 (via rectal tube)  Height:   Ht Readings from Last 1 Encounters:  01/29/19 5' 9"  (1.753 m)    Weight:   Wt Readings from Last 1 Encounters:  02/08/19 (!) 145.8 kg    Ideal Body Weight:  65.9 kg(adjusted weight 101 kg)  BMI:  Body mass index is 47.47 kg/m.  Estimated Nutritional Needs:   Kcal:  2400-2700 kcals  Protein:  132-165 g  Fluid:  >/= 2L    Rayanna Matusik A. Jimmye Norman, RD, LDN, [place] Registered Dietitian II Certified Diabetes Care and Education Specialist Pager: [phone] After hours Pager: [phone]

## 2019-02-10 DIAGNOSIS — J9601 Acute respiratory failure with hypoxia: Secondary | ICD-10-CM

## 2019-02-10 LAB — COMPREHENSIVE METABOLIC PANEL
ALT: 52 U/L — ABNORMAL HIGH (ref 0–44)
AST: 70 U/L — ABNORMAL HIGH (ref 15–41)
Albumin: 2.9 g/dL — ABNORMAL LOW (ref 3.5–5.0)
Alkaline Phosphatase: 35 U/L — ABNORMAL LOW (ref 38–126)
Anion gap: 8 (ref 5–15)
BUN: 37 mg/dL — ABNORMAL HIGH (ref 6–20)
CO2: 35 mmol/L — ABNORMAL HIGH (ref 22–32)
Calcium: 8.4 mg/dL — ABNORMAL LOW (ref 8.9–10.3)
Chloride: 108 mmol/L (ref 98–111)
Creatinine, Ser: 0.79 mg/dL (ref 0.44–1.00)
GFR calc Af Amer: >60 mL/min (ref >60)
GFR calc non Af Amer: >60 mL/min (ref >60)
Glucose, Bld: 216 mg/dL — ABNORMAL HIGH (ref 70–99)
Potassium: 3.9 mmol/L (ref 3.5–5.1)
Sodium: 151 mmol/L — ABNORMAL HIGH (ref 135–145)
Total Bilirubin: 0.6 mg/dL (ref 0.3–1.2)
Total Protein: 5.6 g/dL — ABNORMAL LOW (ref 6.5–8.1)

## 2019-02-10 LAB — GLUCOSE, CAPILLARY
Glucose-Capillary: 141 mg/dL — ABNORMAL HIGH (ref 70–99)
Glucose-Capillary: 159 mg/dL — ABNORMAL HIGH (ref 70–99)
Glucose-Capillary: 168 mg/dL — ABNORMAL HIGH (ref 70–99)
Glucose-Capillary: 174 mg/dL — ABNORMAL HIGH (ref 70–99)
Glucose-Capillary: 188 mg/dL — ABNORMAL HIGH (ref 70–99)
Glucose-Capillary: 210 mg/dL — ABNORMAL HIGH (ref 70–99)

## 2019-02-10 LAB — CBC
HCT: 38.5 % (ref 36.0–46.0)
Hemoglobin: 10.9 g/dL — ABNORMAL LOW (ref 12.0–15.0)
MCH: 27.8 pg (ref 26.0–34.0)
MCHC: 28.3 g/dL — ABNORMAL LOW (ref 30.0–36.0)
MCV: 98.2 fL (ref 80.0–100.0)
Platelets: 130 10*3/uL — ABNORMAL LOW (ref 150–400)
RBC: 3.92 MIL/uL (ref 3.87–5.11)
RDW: 13.8 % (ref 11.5–15.5)
WBC: 11.3 10*3/uL — ABNORMAL HIGH (ref 4.0–10.5)
nRBC: 0.3 % — ABNORMAL HIGH (ref 0.0–0.2)

## 2019-02-10 MED ORDER — METOLAZONE 5 MG PO TABS
5.0000 mg | ORAL_TABLET | Freq: Once | ORAL | Status: AC
  Administered 2019-02-10: 5 mg
  Filled 2019-02-10: qty 1

## 2019-02-10 MED ORDER — DEXMEDETOMIDINE HCL IN NACL 400 MCG/100ML IV SOLN
0.0000 ug/kg/h | INTRAVENOUS | Status: DC
  Administered 2019-02-10 (×2): 0.4 ug/kg/h via INTRAVENOUS
  Administered 2019-02-11 (×2): 0.8 ug/kg/h via INTRAVENOUS
  Administered 2019-02-11: 0.4 ug/kg/h via INTRAVENOUS
  Filled 2019-02-10 (×5): qty 100

## 2019-02-10 MED ORDER — FUROSEMIDE 10 MG/ML IJ SOLN
20.0000 mg | Freq: Four times a day (QID) | INTRAMUSCULAR | Status: AC
  Administered 2019-02-10 (×2): 20 mg via INTRAVENOUS
  Filled 2019-02-10 (×2): qty 2

## 2019-02-10 MED ORDER — POTASSIUM CHLORIDE 20 MEQ/15ML (10%) PO SOLN
40.0000 meq | Freq: Two times a day (BID) | ORAL | Status: AC
  Administered 2019-02-10 (×2): 40 meq
  Filled 2019-02-10 (×2): qty 30

## 2019-02-10 NOTE — Progress Notes (Addendum)
1117: Spoke to patients mother Mardene Celeste. Updated on plan to wean ventilator and sedation today. All questions answered at this time.   1245: Notified MD Icard patient with temperature. Tylenol given, patient packed in ice. Orders for BC, UA, and sputum culture. All cultures obtained and sent.   1445: MD notified patient has not urinated since removal of foley. Patient bladder scanned and in and out cathed.   1800: Spoke to patients sister. Updated on weaning sedative medications today. Notified patient on CPAP for majority of day today. Possible extubation tomorrow. Family would like to FaceTime with patient once extubated if able. All questions answered at this time.

## 2019-02-10 NOTE — Progress Notes (Signed)
NAME:  Anita Hunter, MRN:  [id number], DOB:  1990/10/27, LOS: 83 ADMISSION DATE:  01/26/2019, CONSULTATION DATE:  01/31/19 REFERRING MD:  Avon Gully, CHIEF COMPLAINT:  breathlessness   Brief History   28 year old woman with severe OSA supposed to be on home BIPAP (issues with mask leaks), DM2, seasonal allergies, chronic back pain presenting with URI symptoms who has developed significant COVID pneumonia.  Transferred to ICU 12/19 for abrupt worsening O2 sats.  Subsequently intubated 12/20.   Past Medical History  DM2 Seasonal allergies Mild intermittent asthma OSA/OHS with recommended BIPAP 24/19 cm H2O Chronic back pain  Significant Hospital Events   12/14 admitted 12/19 transferred to ICU 12/20 intubated 12/25 vomiting, possible aspiration, abx initiated with fever  Consults:  PCCM  Procedures:  ETT 12/20 >>  RUE PICC 12/19 >>   Significant Diagnostic Tests:  LE Venous Duplex 12/21 >> negative for DVT bilaterally  Micro Data:  COVID 12/14 >> positive HIV 12/14 >> neg 12/25 urine >  12/25 resp culture > staph aureas 12/25 blood culture >   Antimicrobials:  Actemra 12/17 Remdesivir 12/16 >> 12/20   Vanco 12/25 >>  Cefepime 12/25 >> 12/27 Ceftriaxone 12/27 >>  Interim history/subjective:   Intubated on mechanical life support critically ill  Objective   Blood pressure (!) 128/53, pulse 96, temperature 98.8 F (37.1 C), temperature source Oral, resp. rate (!) 22, height 5' 9"  (1.753 m), weight (!) 145.8 kg, SpO2 95 %.    Vent Mode: PCV FiO2 (%):  [40 %] 40 % Set Rate:  [22 bmp] 22 bmp PEEP:  [14 cmH20] 14 cmH20 Plateau Pressure:  [28 cmH20-30 cmH20] 30 cmH20   Intake/Output Summary (Last 24 hours) at 02/10/2019 5621 Last data filed at 02/10/2019 0600 Gross per 24 hour  Intake 3401.12 ml  Output 2700 ml  Net 701.12 ml   Filed Weights   02/05/19 0449 02/07/19 0500 02/08/19 0500  Weight: (!) 154.9 kg (!) 141.6 kg (!) 145.8 kg     Examination:  General: Young female, morbidly obese, intubated on mechanical life support, critically ill HENT: NCAT, endotracheal tube in place PULM: Bilateral ventilated breath sounds CV: Regular rate and rhythm, S1-S2 GI: Soft nontender morbidly obese pannus MSK: Normal bulk and tone Neuro: Sedated on mechanical ventilation  Resolved Hospital Problem list      Assessment & Plan:   Acute hypoxemic respiratory failure due to COVID pneumonia Complicated by HCAP now Continue mechanical ventilation per ARDS protocol Target TVol 6-8cc/kgIBW Target Plateau Pressure < 30cm H20 Target driving pressure less than 15 cm of water Target PaO2 55-65: titrate PEEP/FiO2 per protocol As long as PaO2 to FiO2 ratio is less than 1:150 position in prone position for 16 hours a day Check CVP daily if CVL in place Target CVP less than 4, diurese as necessary Ventilator associated pneumonia prevention protocol 12/29: Able to decrease some of his sedation requirements today. Off versed and switched to precedex Diuresis today, lasix and [id number]    Question of cardiomyopathy For echo image quality per documentation. Plans for repeat echo before discharge.  Sedation Needs on Mechanical Ventilation Weaning from Versed drip Continue Dilaudid Switch to Precedex if needed.  HCAP > Staph aureus UTI > e coli Sepsis secondary to above  Descalated to Ancef Still febrile today Repeat cultures   Continue to observe   DM II with hyperglycemia Levemir + SSI + inagliptan   AKI > resolved Hypokalemia > resolved Continue to follow urine output and BMP Avoid  nephrotoxic agents  Constipation > resolved  Best practice:  Diet: TF Pain/Anxiety/Delirium protocol (if indicated): PAD as above VAP protocol (if indicated): ordered DVT prophylaxis: Lovenox 0.45m/kg BID  GI prophylaxis: pepcid Glucose control: SSI Mobility: BR Code Status: full code Family Communication: I called the patient's  mother at the telephone number available within the chart.  Once again the number goes directly to voicemail. Disposition: ICU  This patient is critically ill with multiple organ system failure; which, requires frequent high complexity decision making, assessment, support, evaluation, and titration of therapies. This was completed through the application of advanced monitoring technologies and extensive interpretation of multiple databases. During this encounter critical care time was devoted to patient care services described in this note for 32 minutes.  BGarner Nash DO [facility] 02/10/2019 8:11 AM

## 2019-02-11 ENCOUNTER — Inpatient Hospital Stay (HOSPITAL_COMMUNITY): Payer: BC Managed Care – PPO

## 2019-02-11 LAB — CBC
HCT: 40 % (ref 36.0–46.0)
Hemoglobin: 11.2 g/dL — ABNORMAL LOW (ref 12.0–15.0)
MCH: 27.5 pg (ref 26.0–34.0)
MCHC: 28 g/dL — ABNORMAL LOW (ref 30.0–36.0)
MCV: 98.3 fL (ref 80.0–100.0)
Platelets: 136 10*3/uL — ABNORMAL LOW (ref 150–400)
RBC: 4.07 MIL/uL (ref 3.87–5.11)
RDW: 13.6 % (ref 11.5–15.5)
WBC: 12.2 10*3/uL — ABNORMAL HIGH (ref 4.0–10.5)
nRBC: 0 % (ref 0.0–0.2)

## 2019-02-11 LAB — BASIC METABOLIC PANEL
Anion gap: 12 (ref 5–15)
BUN: 34 mg/dL — ABNORMAL HIGH (ref 6–20)
CO2: 37 mmol/L — ABNORMAL HIGH (ref 22–32)
Calcium: 8.8 mg/dL — ABNORMAL LOW (ref 8.9–10.3)
Chloride: 98 mmol/L (ref 98–111)
Creatinine, Ser: 0.82 mg/dL (ref 0.44–1.00)
GFR calc Af Amer: >60 mL/min (ref >60)
GFR calc non Af Amer: >60 mL/min (ref >60)
Glucose, Bld: 153 mg/dL — ABNORMAL HIGH (ref 70–99)
Potassium: 2.9 mmol/L — ABNORMAL LOW (ref 3.5–5.1)
Sodium: 147 mmol/L — ABNORMAL HIGH (ref 135–145)

## 2019-02-11 LAB — URINE CULTURE: Culture: NO GROWTH

## 2019-02-11 LAB — COMPREHENSIVE METABOLIC PANEL
ALT: 49 U/L — ABNORMAL HIGH (ref 0–44)
AST: 70 U/L — ABNORMAL HIGH (ref 15–41)
Albumin: 3.1 g/dL — ABNORMAL LOW (ref 3.5–5.0)
Alkaline Phosphatase: 36 U/L — ABNORMAL LOW (ref 38–126)
Anion gap: 11 (ref 5–15)
BUN: 39 mg/dL — ABNORMAL HIGH (ref 6–20)
CO2: 33 mmol/L — ABNORMAL HIGH (ref 22–32)
Calcium: 8.5 mg/dL — ABNORMAL LOW (ref 8.9–10.3)
Chloride: 102 mmol/L (ref 98–111)
Creatinine, Ser: 0.87 mg/dL (ref 0.44–1.00)
GFR calc Af Amer: >60 mL/min (ref >60)
GFR calc non Af Amer: >60 mL/min (ref >60)
Glucose, Bld: 315 mg/dL — ABNORMAL HIGH (ref 70–99)
Potassium: 3.7 mmol/L (ref 3.5–5.1)
Sodium: 146 mmol/L — ABNORMAL HIGH (ref 135–145)
Total Bilirubin: 0.6 mg/dL (ref 0.3–1.2)
Total Protein: 6.2 g/dL — ABNORMAL LOW (ref 6.5–8.1)

## 2019-02-11 LAB — GLUCOSE, CAPILLARY
Glucose-Capillary: 200 mg/dL — ABNORMAL HIGH (ref 70–99)
Glucose-Capillary: 236 mg/dL — ABNORMAL HIGH (ref 70–99)
Glucose-Capillary: 290 mg/dL — ABNORMAL HIGH (ref 70–99)
Glucose-Capillary: 149 mg/dL — ABNORMAL HIGH (ref 70–99)
Glucose-Capillary: 172 mg/dL — ABNORMAL HIGH (ref 70–99)
Glucose-Capillary: 143 mg/dL — ABNORMAL HIGH (ref 70–99)

## 2019-02-11 LAB — CULTURE, BLOOD (ROUTINE X 2)
Culture: NO GROWTH
Culture: NO GROWTH
Special Requests: ADEQUATE
Special Requests: ADEQUATE

## 2019-02-11 LAB — PHOSPHORUS: Phosphorus: 3.8 mg/dL (ref 2.5–4.6)

## 2019-02-11 LAB — MAGNESIUM: Magnesium: 1.8 mg/dL (ref 1.7–2.4)

## 2019-02-11 MED ORDER — DOCUSATE SODIUM 50 MG/5ML PO LIQD
100.0000 mg | Freq: Two times a day (BID) | ORAL | Status: DC | PRN
  Administered 2019-02-12: 100 mg via ORAL
  Filled 2019-02-11: qty 10

## 2019-02-11 MED ORDER — POTASSIUM CHLORIDE 10 MEQ/50ML IV SOLN
10.0000 meq | INTRAVENOUS | Status: AC
  Administered 2019-02-11 – 2019-02-12 (×8): 10 meq via INTRAVENOUS
  Filled 2019-02-11 (×2): qty 50

## 2019-02-11 MED ORDER — INSULIN DETEMIR 100 UNIT/ML ~~LOC~~ SOLN
35.0000 [IU] | Freq: Two times a day (BID) | SUBCUTANEOUS | Status: DC
  Administered 2019-02-12 – 2019-02-20 (×17): 35 [IU] via SUBCUTANEOUS
  Filled 2019-02-11 (×19): qty 0.35

## 2019-02-11 MED ORDER — FUROSEMIDE 10 MG/ML IJ SOLN
40.0000 mg | Freq: Four times a day (QID) | INTRAMUSCULAR | Status: AC
  Administered 2019-02-11 (×2): 40 mg via INTRAVENOUS
  Filled 2019-02-11 (×2): qty 4

## 2019-02-11 MED ORDER — METOLAZONE 5 MG PO TABS
5.0000 mg | ORAL_TABLET | Freq: Once | ORAL | Status: AC
  Administered 2019-02-11: 5 mg
  Filled 2019-02-11: qty 1

## 2019-02-11 NOTE — Progress Notes (Addendum)
Patient extubated today to 15L HFNC. No s/s of respiratory distress.  10:57a - Mother Mardene Celeste notified of extubation. Would like to FaceTime with daughter tonight after shift change.   1100: Per pharmacy 73m of dilaudid gtt wasted with Moriah RN.  19024 MD Icard paged for K+ 2.9 on repeat labs. See orders.

## 2019-02-11 NOTE — Progress Notes (Signed)
  Speech Language Pathology  Patient Details Name: Anita Hunter MRN: [id number] DOB: November 27, 1990 Today's Date: 02/11/2019 Time:  -     Bedside swallow eval ordered after pt extubated around 10:30 this am following a 10 day intubation. ST will defer eval to tomorrow to allow more time post extubation (plan for am)       Houston Siren 02/11/2019, 3:18 PM

## 2019-02-11 NOTE — Progress Notes (Addendum)
NAME:  Anita Hunter, MRN:  [id number], DOB:  1990-12-31, LOS: 16 ADMISSION DATE:  01/26/2019, CONSULTATION DATE:  01/31/19 REFERRING MD:  Avon Gully, CHIEF COMPLAINT:  breathlessness   Brief History   28 year old woman with severe OSA supposed to be on home BIPAP (issues with mask leaks), DM2, seasonal allergies, chronic back pain presenting with URI symptoms who has developed significant COVID pneumonia.  Transferred to ICU 12/19 for abrupt worsening O2 sats.  Subsequently intubated 12/20.   Past Medical History  DM2 Seasonal allergies Mild intermittent asthma OSA/OHS with recommended BIPAP 24/19 cm H2O Chronic back pain  Significant Hospital Events   12/14 admitted 12/19 transferred to ICU 12/20 intubated 12/25 vomiting, possible aspiration, abx initiated with fever  Consults:  PCCM  Procedures:  ETT 12/20 >>  RUE PICC 12/19 >>   Significant Diagnostic Tests:  LE Venous Duplex 12/21 >> negative for DVT bilaterally  Micro Data:  COVID 12/14 >> positive HIV 12/14 >> neg 12/25 urine >  12/25 resp culture > staph aureas 12/25 blood culture > NGTD  Antimicrobials:  Actemra 12/17 Remdesivir 12/16 >> 12/20   Vanco 12/25 >>  Cefepime 12/25 >> 12/27 Ceftriaxone 12/27 >>  Interim history/subjective:   Doing well this morning on pressure support ventilation.  Tolerating SBT plans for liberation from mechanical ventilator  Objective   Blood pressure (!) 125/56, pulse 95, temperature (!) 100.8 F (38.2 C), temperature source Axillary, resp. rate (!) 35, height 5' 9"  (1.753 m), weight (!) 151 kg, SpO2 91 %.    Vent Mode: PSV;CPAP FiO2 (%):  [40 %-50 %] 50 % Set Rate:  [22 bmp] 22 bmp PEEP:  [5 cmH20-10 cmH20] 8 cmH20 Pressure Support:  [12 cmH20-15 cmH20] 15 cmH20 Plateau Pressure:  [23 cmH20-28 cmH20] 28 cmH20   Intake/Output Summary (Last 24 hours) at 02/11/2019 0831 Last data filed at 02/11/2019 0700 Gross per 24 hour  Intake 2925.35 ml  Output 1455 ml  Net  1470.35 ml   Filed Weights   02/07/19 0500 02/08/19 0500 02/11/19 0500  Weight: (!) 141.6 kg (!) 145.8 kg (!) 151 kg    Examination:  General: Young female, morbidly obese, intubated on mechanical life support critically ill HENT: NCAT, large neck, endotracheal tube in place PULM: Bilateral ventilated breath sounds CV: Regular rate and rhythm, S1-S2 GI: Soft, nontender, nondistended morbidly obese pannus MSK: Normal bulk and tone Neuro: Sedated on mechanical ventilation, comfortable able to follow commands moves all 4 extremities nods head yes and no to questions  Resolved Hospital Problem list      Assessment & Plan:   Acute hypoxemic respiratory failure due to COVID pneumonia Complicated by HCAP now Tolerating SBT, following commands. Doing well on pressure support ventilation. Plans for liberation from mechanical support.  Question of cardiomyopathy For echo image quality per documentation. Will need repeat echo prior to discharge  Sedation Needs on Mechanical Ventilation All continuous sedation stopped plans for extubation All oral sedative stopped  HCAP > Staph aureus UTI > e coli Sepsis secondary to above  Continue Ancef  DM II with hyperglycemia Levemir plus SSI plus linagliptin Since patient is being extubated and we will lose tube feed and enteral access.  Will decrease Levemir to half the dose starting tomorrow.  AKI > resolved Hypokalemia > resolved Mild hyponatremia Positive cumulative fluid balance Continue to follow urine output and basic metabolic panel Avoid nephrotoxic agents Additional dose of diuresis today.  Constipation > resolved  Once extubated will likely need speech  evaluation/swallow evaluation  Best practice:  Diet: TF Pain/Anxiety/Delirium protocol (if indicated): PAD as above VAP protocol (if indicated): ordered DVT prophylaxis: Lovenox 0.45m/kg BID  GI prophylaxis: pepcid Glucose control: SSI Mobility: BR Code Status: full  code Family Communication: I attempted to call the patient's primary contact listed in the chart as PCopeland Lapierher mother.  Once again the telephone number goes directly to voicemail. Disposition: ICU  This patient is critically ill with multiple organ system failure; which, requires frequent high complexity decision making, assessment, support, evaluation, and titration of therapies. This was completed through the application of advanced monitoring technologies and extensive interpretation of multiple databases. During this encounter critical care time was devoted to patient care services described in this note for 32 minutes.  BGarner Nash DO [facility] 02/11/2019 8:31 AM

## 2019-02-11 NOTE — Progress Notes (Signed)
Assisted tele visit to patient with family member.  Adam Phenix, RN

## 2019-02-11 NOTE — Progress Notes (Signed)
Inpatient Diabetes Program Recommendations  AACE/ADA: New Consensus Statement on Inpatient Glycemic Control (2015)  Target Ranges:  Prepandial:   less than 140 mg/dL      Peak postprandial:   less than 180 mg/dL (1-2 hours)      Critically ill patients:  140 - 180 mg/dL   Lab Results  Component Value Date   GLUCAP 290 (H) 02/11/2019   HGBA1C 11.1 (H) 01/26/2019    Review of Glycemic Control Results for JAELANI, POSA (MRN [id number]) as of 02/11/2019 11:17  Ref. Range 02/10/2019 16:10 02/10/2019 19:50 02/11/2019 00:50 02/11/2019 04:45 02/11/2019 07:57  Glucose-Capillary Latest Ref Range: 70 - 99 mg/dL 174 (H) 188 (H) 200 (H) 236 (H) 290 (H)   Diabetes history: DM 2-New diagnosis Outpatient Diabetes medications:  None Current orders for Inpatient glycemic control:  Levemir 70 units bid, Novolog resistant q 4 hours Novolog 14 units q 4 hours  Inpatient Diabetes Program Recommendations:    -Note that patient extubated today.  Tube feeds held.   -Please reduce Levemir to 35 units bid.  Consider reducing Novolog correction to moderate and D/c Novolog tube feed coverage if tube feeds will be stopped permanently.   Thanks  Adah Perl, RN, BC-ADM Inpatient Diabetes Coordinator Pager [phone] (8a-5p)

## 2019-02-11 NOTE — Procedures (Signed)
Extubation Procedure Note  Patient Details:   Name: ERYKAH LIPPERT DOB: [birth year] MRN: [id number]   Airway Documentation:    Vent end date: 02/11/19 Vent end time: 1020   Evaluation  O2 sats: stable throughout Complications: No apparent complications Patient did tolerate procedure well. Bilateral Breath Sounds: Diminished   Yes   Leak Test Positive.  No stridor noted.  Laurens Matheny 02/11/2019, 10:29 AM

## 2019-02-12 LAB — GLUCOSE, CAPILLARY
Glucose-Capillary: 128 mg/dL — ABNORMAL HIGH (ref 70–99)
Glucose-Capillary: 134 mg/dL — ABNORMAL HIGH (ref 70–99)
Glucose-Capillary: 153 mg/dL — ABNORMAL HIGH (ref 70–99)
Glucose-Capillary: 173 mg/dL — ABNORMAL HIGH (ref 70–99)
Glucose-Capillary: 187 mg/dL — ABNORMAL HIGH (ref 70–99)
Glucose-Capillary: 196 mg/dL — ABNORMAL HIGH (ref 70–99)
Glucose-Capillary: 198 mg/dL — ABNORMAL HIGH (ref 70–99)

## 2019-02-12 LAB — COMPREHENSIVE METABOLIC PANEL
ALT: 52 U/L — ABNORMAL HIGH (ref 0–44)
AST: 108 U/L — ABNORMAL HIGH (ref 15–41)
Albumin: 3.4 g/dL — ABNORMAL LOW (ref 3.5–5.0)
Alkaline Phosphatase: 34 U/L — ABNORMAL LOW (ref 38–126)
Anion gap: 15 (ref 5–15)
BUN: 35 mg/dL — ABNORMAL HIGH (ref 6–20)
CO2: 37 mmol/L — ABNORMAL HIGH (ref 22–32)
Calcium: 8.6 mg/dL — ABNORMAL LOW (ref 8.9–10.3)
Chloride: 97 mmol/L — ABNORMAL LOW (ref 98–111)
Creatinine, Ser: 0.85 mg/dL (ref 0.44–1.00)
GFR calc Af Amer: >60 mL/min (ref >60)
GFR calc non Af Amer: >60 mL/min (ref >60)
Glucose, Bld: 141 mg/dL — ABNORMAL HIGH (ref 70–99)
Potassium: 3.3 mmol/L — ABNORMAL LOW (ref 3.5–5.1)
Sodium: 149 mmol/L — ABNORMAL HIGH (ref 135–145)
Total Bilirubin: 1 mg/dL (ref 0.3–1.2)
Total Protein: 6.2 g/dL — ABNORMAL LOW (ref 6.5–8.1)

## 2019-02-12 LAB — CBC
HCT: 40.4 % (ref 36.0–46.0)
Hemoglobin: 11.7 g/dL — ABNORMAL LOW (ref 12.0–15.0)
MCH: 27.3 pg (ref 26.0–34.0)
MCHC: 29 g/dL — ABNORMAL LOW (ref 30.0–36.0)
MCV: 94.2 fL (ref 80.0–100.0)
Platelets: 143 10*3/uL — ABNORMAL LOW (ref 150–400)
RBC: 4.29 MIL/uL (ref 3.87–5.11)
RDW: 13.6 % (ref 11.5–15.5)
WBC: 11.9 10*3/uL — ABNORMAL HIGH (ref 4.0–10.5)
nRBC: 0 % (ref 0.0–0.2)

## 2019-02-12 MED ORDER — SODIUM CHLORIDE 0.9 % IV SOLN
INTRAVENOUS | Status: DC | PRN
  Administered 2019-02-12: 250 mL via INTRAVENOUS

## 2019-02-12 MED ORDER — FAMOTIDINE IN NACL 20-0.9 MG/50ML-% IV SOLN
20.0000 mg | Freq: Two times a day (BID) | INTRAVENOUS | Status: DC
  Administered 2019-02-12 – 2019-02-16 (×8): 20 mg via INTRAVENOUS
  Filled 2019-02-12 (×8): qty 50

## 2019-02-12 MED ORDER — POTASSIUM CHLORIDE 10 MEQ/50ML IV SOLN
10.0000 meq | INTRAVENOUS | Status: AC
  Administered 2019-02-12 (×3): 10 meq via INTRAVENOUS
  Filled 2019-02-12 (×3): qty 50

## 2019-02-12 NOTE — Progress Notes (Signed)
NAME:  Anita Hunter, MRN:  [id number], DOB:  Feb 10, 1991, LOS: 69 ADMISSION DATE:  01/26/2019, CONSULTATION DATE:  01/31/19 REFERRING MD:  Avon Gully, CHIEF COMPLAINT:  breathlessness   Brief History   28 year old woman with severe OSA supposed to be on home BIPAP (issues with mask leaks), DM2, seasonal allergies, chronic back pain presenting with URI symptoms who has developed significant COVID pneumonia.  Transferred to ICU 12/19 for abrupt worsening O2 sats.  Subsequently intubated 12/20.   Past Medical History  DM2 Seasonal allergies Mild intermittent asthma OSA/OHS with recommended BIPAP 24/19 cm H2O Chronic back pain  Significant Hospital Events   12/14 admitted 12/19 transferred to ICU 12/20 intubated 12/25 vomiting, possible aspiration, abx initiated with fever  Consults:  PCCM  Procedures:  ETT 12/20 >> 12/30 RUE PICC 12/19 >>   Significant Diagnostic Tests:  LE Venous Duplex 12/21 >> negative for DVT bilaterally  Micro Data:  COVID 12/14 >> positive HIV 12/14 >> neg 12/25 urine > E. coli (pansensitive) 12/25 resp culture > MSSA 12/25 blood culture > negative 12/29 blood cultures >> 12/29 respiratory >> staph aureus >> 12/29 urine >> negative  Antimicrobials:  Actemra 12/17 Remdesivir 12/16 >> 12/20   Vanco 12/25 >> 12/28 Cefepime 12/25 >> 12/27 Ceftriaxone 12/27 >> 12/28 Cefazolin 12/28 >>   Interim history/subjective:   Extubated successfully on 12/30 Did not wear BiPAP overnight, not sure that it was ordered  Objective   Blood pressure (!) 101/54, pulse (!) 123, temperature 99.1 F (37.3 C), temperature source Oral, resp. rate (!) 21, height 5' 9"  (1.753 m), weight (!) 151 kg, SpO2 91 %.        Intake/Output Summary (Last 24 hours) at 02/12/2019 1204 Last data filed at 02/12/2019 0800 Gross per 24 hour  Intake 957.98 ml  Output 3500 ml  Net -2542.02 ml   Filed Weights   02/07/19 0500 02/08/19 0500 02/11/19 0500  Weight: (!) 141.6 kg  (!) 145.8 kg (!) 151 kg    Examination:  General: Morbidly obese woman, lying in bed, weak globally.  Some dyspnea with any activity including speaking HENT: Oropharynx clear, large neck, no stridor PULM: Distant but clear bilaterally, decreased at both bases CV: Regular, no murmur GI: Large, obese, soft, nondistended, hypoactive bowel sounds MSK: No deformities trace pretibial edema Neuro: Awake, attempts to speak but just a whisper, broken sentences due to dyspnea  Resolved Hospital Problem list      Assessment & Plan:   Acute hypoxemic respiratory failure due to COVID pneumonia Complicated by HCAP  Chronic hypoxic and hypercapnic respiratory failure due to presumed OSA/OHS Push pulmonary hygiene aggressively SLP evaluation for ability to swallow.  She does not appear ready at this time due to overall weakness, oropharyngeal weakness BiPAP nightly and as needed during the day via a closed system.  She states that her CPAP compliance at home has been poor due to mask leak and poor fit  Question of cardiomyopathy, due to viral process or possibly sepsis Plan repeat echocardiogram at some point once stabilized from these acute events, likely before discharge  Sedation Needs on Mechanical Ventilation All continuous sedation stopped plans for extubation All oral sedative stopped  HCAP > MSSA UTI > pansensitive E. coli Sepsis secondary to above  Cefazolin, plan to complete 7 days from her most recent positive respiratory culture which was 12/29.  Plan stop date 02/16/2019  DM II with hyperglycemia Levemir at adjusted dose Sliding-scale insulin Stop linagliptin 12/31 since she is  n.p.o.   AKI > resolved Hypokalemia  Mild hyponatremia Positive cumulative fluid balance Follow urine output, BMP Potassium replacement 12/31 Hold off on diuresis 12/31 given decreased enteral intake and hypernatremia.  May be able to restart on 1/1  Constipation > resolved   Best practice:    Diet: TF Pain/Anxiety/Delirium protocol (if indicated): PAD as above VAP protocol (if indicated): ordered DVT prophylaxis: Lovenox 0.37m/kg BID  GI prophylaxis: pepcid Glucose control: SSI Mobility: BR Code Status: full code Family Communication: Unable to reach mother, primary contact, voicemail full.  I spoke with her sister LPhineas Realby phone 12/31, full update. Disposition: ICU  This patient is critically ill with multiple organ system failure; which, requires frequent high complexity decision making, assessment, support, evaluation, and titration of therapies. This was completed through the application of advanced monitoring technologies and extensive interpretation of multiple databases. During this encounter critical care time was devoted to patient care services described in this note for 32 minutes.   RBaltazar Apo MD, PhD 02/12/2019, 12:35 PM [facility] [phone] if no answer [phone]

## 2019-02-12 NOTE — Evaluation (Signed)
Clinical/Bedside Swallow Evaluation Patient Details  Name: Anita Hunter MRN: [id number] Date of Birth: [birth year]  Today's Date: 02/12/2019 Time: SLP Start Time (ACUTE ONLY): 2774 SLP Stop Time (ACUTE ONLY): 0842 SLP Time Calculation (min) (ACUTE ONLY): 20 min  Past Medical History:  Past Medical History:  Diagnosis Date  . Back pain   . Eczema   . Seasonal allergies   . Sleep apnea   . Type 2 diabetes mellitus ([place]) 01/26/2019   Past Surgical History:  Past Surgical History:  Procedure Laterality Date  . NO PAST SURGERIES     HPI:  Anita Hunter is a 28 y.o. female with medical history significant of chronic back pain, morbid obesity, eczema, seasonal allergies, sleep apnea, type 2 diabetes admitted with progressively worse shortness of breath. Intubated 12/19-12/30. CXR Worsening patchy opacities throughout both lungs compatible with   Assessment / Plan / Recommendation Clinical Impression  Nyeemah cooperative but drowsy following 11 day intubation, extubated yesterday. She is unable to [facility] vocal cord adduction noted by aphonia with whispered attempts at vocalization. Weak cough and does no audible congestion noted. Throat clears present with initial trial ice and teaspoon sips water. Shamikia may have intermittent ice chips today/night after cleaning oral cavity and prognosis good for initiation of solids/liquids soon but due to drowsiness and overall deconditioning recommend NPO (except ice). ST will continue to follow and make safest recommendations toward po's. SLP Visit Diagnosis: Dysphagia, unspecified (R13.10)    Aspiration Risk  Mild aspiration risk    Diet Recommendation NPO;Ice chips PRN after oral care   Medication Administration: Via alternative means    Other  Recommendations Oral Care Recommendations: Oral care QID   Follow up Recommendations Inpatient Rehab(TBD)      Frequency and Duration min 2x/week  2 weeks       Prognosis Prognosis for  Safe Diet Advancement: Good      Swallow Study   General HPI: Anita Hunter is a 28 y.o. female with medical history significant of chronic back pain, morbid obesity, eczema, seasonal allergies, sleep apnea, type 2 diabetes admitted with progressively worse shortness of breath. Intubated 12/19-12/30. CXR Worsening patchy opacities throughout both lungs compatible with Type of Study: Bedside Swallow Evaluation Previous Swallow Assessment: (none) Diet Prior to this Study: NPO Temperature Spikes Noted: Yes Respiratory Status: (HFNC 10L) History of Recent Intubation: Yes Length of Intubations (days): 11 days Date extubated: 02/11/19 Behavior/Cognition: Requires cueing;Lethargic/Drowsy;Cooperative Oral Cavity Assessment: Dry(halitosis) Oral Care Completed by SLP: Yes Oral Cavity - Dentition: Adequate natural dentition Vision: Functional for self-feeding Self-Feeding Abilities: Needs assist Patient Positioning: Upright in bed Baseline Vocal Quality: Aphonic;Low vocal intensity Volitional Cough: Weak Volitional Swallow: Able to elicit    Oral/Motor/Sensory Function Overall Oral Motor/Sensory Function: Within functional limits   Ice Chips Ice chips: Impaired Presentation: Spoon Pharyngeal Phase Impairments: Throat Clearing - Delayed   Thin Liquid Thin Liquid: Impaired Presentation: Spoon Pharyngeal  Phase Impairments: Throat Clearing - Immediate    Nectar Thick Nectar Thick Liquid: Not tested   Honey Thick Honey Thick Liquid: Not tested   Puree Puree: Within functional limits   Solid     Solid: Not tested      Houston Siren 02/12/2019,9:09 AM  Anita Hunter.Ed Risk analyst [phone] Office [phone]

## 2019-02-12 NOTE — Progress Notes (Signed)
Assisted tele visit to patient with family members.  Detria Cummings Ann, RN  

## 2019-02-12 NOTE — Progress Notes (Signed)
Elink notified of HR sustaining in the 130s

## 2019-02-12 NOTE — Progress Notes (Signed)
66 Patient's mother updated on patient condition via phone. All questions and concerns answered at this time by RN. Patient resting comfortably in bed with VSS. Will continue to monitor.  1900 Dr. Lamonte Sakai paged at 1800 for patient oral temp of 103.1. Axillary temp 102.5. Patient has been baseline ST 120s throughout this shift with BPs intermittently low with patient position and periods of rest. Order for IV tylenol requested from MD. Charge RN Estill Bamberg made aware. Awaiting call back or orders from physician.

## 2019-02-12 NOTE — Progress Notes (Signed)
Pharmacy Antibiotic Note  Anita Hunter is a 28 y.o. female admitted on 01/26/2019 with COVID-19 pneumonia.  Cefepime was narrowed to Ceftriaxone which was narrowed to Cefazolin for Ecoli UTI and MSSA pneumonia.  Plan: Continue cefazolin 2g IV q8h. Duration:  continue 5 days from last staph resp cxt on 12/29 per Dr. Lamonte Sakai.  Total duration 9 days. Pharmacy to enter stop date and will s/o consult.   Height: 5' 9"  (175.3 cm) Weight: (!) 332 lb 14.3 oz (151 kg) IBW/kg (Calculated) : 66.2  Temp (24hrs), Avg:100.4 F (38 C), Min:99.1 F (37.3 C), Max:101.7 F (38.7 C)  Recent Labs  Lab 02/07/19 2123 02/08/19 0422 02/08/19 0536 02/09/19 0520 02/10/19 0954 02/11/19 0518 02/11/19 1438 02/12/19 0400  WBC  --  12.6*  --  11.2* 11.3* 12.2*  --  11.9*  CREATININE  --  0.70  --  0.77 0.79 0.87 0.82 0.85  VANCOTROUGH  --   --  11*  --   --   --   --   --   VANCOPEAK 35  --   --   --   --   --   --   --     Estimated Creatinine Clearance: 157.1 mL/min (by C-G formula based on SCr of 0.85 mg/dL).    Allergies  Allergen Reactions  . Shellfish Allergy Nausea Only    Antimicrobials this admission: 12/16 Remdesivir >> 12/20 12/25 Vanc >> 12/28 12/25 Cefepime >> 12/27 12/27 Ceftriaxone >> 12/28 12/28 Cefazolin>> 1/2  Microbiology results: 12/20 MRSA PCR: positive 12/25 BCx: NGF 12/25 UCx:  >100k E.coli (pan sensitive)  12/25 Trach asp:  moderate staph aureus, MSSA   12/29 BCx: ngtd 12/29 TA: rare staph aureus 12/29 UCx: NGF  Thank you for allowing pharmacy to be a part of this patient's care.  Gretta Arab PharmD, BCPS Clinical pharmacist phone 7am- 5pm: [phone] 02/12/2019 1:33 PM

## 2019-02-13 LAB — CBC
HCT: 40.1 % (ref 36.0–46.0)
Hemoglobin: 11.7 g/dL — ABNORMAL LOW (ref 12.0–15.0)
MCH: 27.7 pg (ref 26.0–34.0)
MCHC: 29.2 g/dL — ABNORMAL LOW (ref 30.0–36.0)
MCV: 94.8 fL (ref 80.0–100.0)
Platelets: 141 10*3/uL — ABNORMAL LOW (ref 150–400)
RBC: 4.23 MIL/uL (ref 3.87–5.11)
RDW: 14.4 % (ref 11.5–15.5)
WBC: 11.7 10*3/uL — ABNORMAL HIGH (ref 4.0–10.5)
nRBC: 0.2 % (ref 0.0–0.2)

## 2019-02-13 LAB — BASIC METABOLIC PANEL
Anion gap: 14 (ref 5–15)
BUN: 29 mg/dL — ABNORMAL HIGH (ref 6–20)
CO2: 35 mmol/L — ABNORMAL HIGH (ref 22–32)
Calcium: 8.3 mg/dL — ABNORMAL LOW (ref 8.9–10.3)
Chloride: 98 mmol/L (ref 98–111)
Creatinine, Ser: 0.86 mg/dL (ref 0.44–1.00)
GFR calc Af Amer: >60 mL/min (ref >60)
GFR calc non Af Amer: >60 mL/min (ref >60)
Glucose, Bld: 208 mg/dL — ABNORMAL HIGH (ref 70–99)
Potassium: 3.4 mmol/L — ABNORMAL LOW (ref 3.5–5.1)
Sodium: 147 mmol/L — ABNORMAL HIGH (ref 135–145)

## 2019-02-13 LAB — CULTURE, RESPIRATORY W GRAM STAIN

## 2019-02-13 LAB — GLUCOSE, CAPILLARY
Glucose-Capillary: 196 mg/dL — ABNORMAL HIGH (ref 70–99)
Glucose-Capillary: 217 mg/dL — ABNORMAL HIGH (ref 70–99)
Glucose-Capillary: 203 mg/dL — ABNORMAL HIGH (ref 70–99)
Glucose-Capillary: 251 mg/dL — ABNORMAL HIGH (ref 70–99)
Glucose-Capillary: 185 mg/dL — ABNORMAL HIGH (ref 70–99)

## 2019-02-13 MED ORDER — LORAZEPAM 2 MG/ML IJ SOLN
1.0000 mg | INTRAMUSCULAR | Status: DC | PRN
  Administered 2019-02-13: 1 mg via INTRAVENOUS
  Filled 2019-02-13 (×2): qty 1

## 2019-02-13 MED ORDER — INSULIN ASPART 100 UNIT/ML ~~LOC~~ SOLN
6.0000 [IU] | Freq: Three times a day (TID) | SUBCUTANEOUS | Status: DC
  Administered 2019-02-13 – 2019-02-19 (×18): 6 [IU] via SUBCUTANEOUS

## 2019-02-13 NOTE — Progress Notes (Signed)
Clinical/Bedside Swallow Evaluation Patient Details  Name: Anita Hunter MRN: [id number] Date of Birth: [birth year]  Today's Date: 02/13/2019 Time: SLP Start Time (ACUTE ONLY): 1607 SLP Stop Time (ACUTE ONLY): 1351 SLP Time Calculation (min) (ACUTE ONLY): 16 min  Past Medical History:  Past Medical History:  Diagnosis Date  . Back pain   . Eczema   . Seasonal allergies   . Sleep apnea   . Type 2 diabetes mellitus ([place]) 01/26/2019   Past Surgical History:  Past Surgical History:  Procedure Laterality Date  . NO PAST SURGERIES     HPI:  Anita Hunter is a 29 y.o. female with medical history significant of chronic back pain, morbid obesity, eczema, seasonal allergies, sleep apnea, type 2 diabetes admitted with progressively worse shortness of breath. Intubated 12/19-12/30. CXR Worsening patchy opacities throughout both lungs compatible with   Assessment / Plan / Recommendation Clinical Impression   Pts diet has been advanced to clear liquids by MD. She is alert and has a moderately hoarse/breathy voice but audible and apparently improving. SLP offered trials of ice and sips without any signs of aspiration. Pt also successfully completed the 3 oz water swallow without cough. Pt did not want any solids, but did ask for an orange. There is no indication that pt would not be able to masticate. Brought an orange later to the secretary for the RN to give to the pt. RN and MD can advance diet when pt ready to start meals. Will sign off for swallowing at this time.  SLP Visit Diagnosis: Dysphagia, unspecified (R13.10)    Aspiration Risk       Diet Recommendation     Medication Administration: Whole meds with liquid Compensations: Slow rate;Small sips/bites    Other  Recommendations     Follow up Recommendations Inpatient Rehab      Frequency and Duration            Prognosis        Swallow Study   General HPI: Anita Hunter is a 29 y.o. female with medical history  significant of chronic back pain, morbid obesity, eczema, seasonal allergies, sleep apnea, type 2 diabetes admitted with progressively worse shortness of breath. Intubated 12/19-12/30. CXR Worsening patchy opacities throughout both lungs compatible with Temperature Spikes Noted: Yes Oral Cavity - Dentition: Adequate natural dentition    Oral/Motor/Sensory Function     Ice Chips     Thin Liquid      Nectar Thick     Honey Thick     Puree     Solid           Herbie Baltimore, MA CCC-SLP  Acute Rehabilitation Services Pager [phone] Office [phone]  Shavonta Gossen, Katherene Ponto 02/13/2019,2:10 PM

## 2019-02-13 NOTE — Progress Notes (Signed)
NAME:  Anita Hunter, MRN:  [id number], DOB:  07-Dec-1990, LOS: 53 ADMISSION DATE:  01/26/2019, CONSULTATION DATE:  01/31/19 REFERRING MD:  Avon Gully, CHIEF COMPLAINT:  breathlessness   Brief History   29 year old woman with severe OSA supposed to be on home BIPAP (issues with mask leaks), DM2, seasonal allergies, chronic back pain presenting with URI symptoms who has developed significant COVID pneumonia.  Transferred to ICU 12/19 for abrupt worsening O2 sats.  Subsequently intubated 12/20.   Past Medical History  DM2 Seasonal allergies Mild intermittent asthma OSA/OHS with recommended BIPAP 24/19 cm H2O Chronic back pain  Significant Hospital Events   12/14 admitted 12/19 transferred to ICU 12/20 intubated 12/25 vomiting, possible aspiration, abx initiated with fever  Consults:  PCCM  Procedures:  ETT 12/20 >> 12/30 RUE PICC 12/19 >>   Significant Diagnostic Tests:  LE Venous Duplex 12/21 >> negative for DVT bilaterally  Micro Data:  COVID 12/14 >> positive HIV 12/14 >> neg 12/25 urine > E. coli (pansensitive) 12/25 resp culture > MSSA 12/25 blood culture > negative 12/29 blood cultures >> 12/29 respiratory >> staph aureus >> 12/29 urine >> negative  Antimicrobials:  Actemra 12/17 Remdesivir 12/16 >> 12/20   Vanco 12/25 >> 12/28 Cefepime 12/25 >> 12/27 Ceftriaxone 12/27 >> 12/28 Cefazolin 12/28 >>   Interim history/subjective:   Remained extubated, more awake today, tolerating some ice chips Did wear her BiPAP overnight last night She had a temperature 102.5 overnight  Objective   Blood pressure 108/66, pulse (!) 114, temperature 100.2 F (37.9 C), resp. rate (!) 22, height 5' 9"  (1.753 m), weight (!) 151 kg, SpO2 94 %.    Vent Mode: BIPAP FiO2 (%):  [40 %] 40 % Set Rate:  [10 bmp] 10 bmp PEEP:  [10 cmH20-14 cmH20] 10 cmH20 Plateau Pressure:  [10 cmH20-18 cmH20] 18 cmH20   Intake/Output Summary (Last 24 hours) at 02/13/2019 0844 Last data filed at  02/13/2019 0400 Gross per 24 hour  Intake 520 ml  Output 1400 ml  Net -880 ml   Filed Weights   02/07/19 0500 02/08/19 0500 02/11/19 0500  Weight: (!) 141.6 kg (!) 145.8 kg (!) 151 kg    Examination:  General: Morbidly obese woman, lying in bed, weak globally.no SOB HENT: Oropharynx clear, large neck, no stridor PULM: Distant but clear bilaterally, decreased at both bases CV: Regular, no murmur GI: Large, obese, soft, nondistended, hypoactive bowel sounds MSK: No deformities trace pretibial edema Neuro: Awake, better able communicate, strong cough, remains globaly weak  Resolved Hospital Problem list      Assessment & Plan:   Acute hypoxemic respiratory failure due to COVID pneumonia Complicated by HCAP  Chronic hypoxic and hypercapnic respiratory failure due to presumed OSA/OHS Continue to push pulmonary hygiene Appreciate SLP evaluation, she was not ready for oral intake on 12/31, hopefully soon Continue BiPAP nightly via a closed system.  She will need to work on CPAP compliance at home (has had a poor mask fit)   Question of cardiomyopathy, due to viral process or possibly sepsis Plan to repeat her echocardiogram at some point during this hospitalization once stabilized from her acute events, goal before discharge  Sedation Needs on Mechanical Ventilation, resolved Hold all sedating medications, at high risk for hypercapnia  HCAP > MSSA UTI > pansensitive E. coli Sepsis secondary to above  Currently on cefazolin.  Both organisms should be sensitive.  Last positive culture was 12/29, plan to complete 5 days post that result, stop  date 1/2. If she spikes fever again then will repeat her cultures  DM II with hyperglycemia Continue Levemir at adjusted dose.  Should be able to increase when oral intake Linagliptin on hold since she is n.p.o. Sliding-scale insulin   AKI > resolved Hypokalemia  Mild hyponatremia Positive cumulative fluid balance Continue to follow  BMP, urine output Repeat potassium replacement on 1/1 Holding off on diuresis given her decreased enteral intake, relative hypernatremia.  May be able to restart on 1/2  Constipation > resolved   Best practice:  Diet: try clears 1/1 Pain/Anxiety/Delirium protocol (if indicated): PAD as above VAP protocol (if indicated): ordered DVT prophylaxis: Lovenox 0.64m/kg BID  GI prophylaxis: pepcid Glucose control: SSI Mobility: BR Code Status: full code Family Communication: updated pt's sister Anita Hunter 1/1.  Disposition: SDU status, stay in ICU to facilitate nocturnal biPAP     RBaltazar Apo MD, PhD 02/13/2019, 8:44 AM  Pulmonary and Critical Care [phone] if no answer [phone]

## 2019-02-14 LAB — GLUCOSE, CAPILLARY
Glucose-Capillary: 172 mg/dL — ABNORMAL HIGH (ref 70–99)
Glucose-Capillary: 178 mg/dL — ABNORMAL HIGH (ref 70–99)
Glucose-Capillary: 185 mg/dL — ABNORMAL HIGH (ref 70–99)
Glucose-Capillary: 186 mg/dL — ABNORMAL HIGH (ref 70–99)
Glucose-Capillary: 204 mg/dL — ABNORMAL HIGH (ref 70–99)

## 2019-02-14 LAB — CBC
HCT: 39.4 % (ref 36.0–46.0)
Hemoglobin: 11.5 g/dL — ABNORMAL LOW (ref 12.0–15.0)
MCH: 27.6 pg (ref 26.0–34.0)
MCHC: 29.2 g/dL — ABNORMAL LOW (ref 30.0–36.0)
MCV: 94.7 fL (ref 80.0–100.0)
Platelets: 139 10*3/uL — ABNORMAL LOW (ref 150–400)
RBC: 4.16 MIL/uL (ref 3.87–5.11)
RDW: 14.4 % (ref 11.5–15.5)
WBC: 9 10*3/uL (ref 4.0–10.5)
nRBC: 0.3 % — ABNORMAL HIGH (ref 0.0–0.2)

## 2019-02-14 LAB — BASIC METABOLIC PANEL
Anion gap: 17 — ABNORMAL HIGH (ref 5–15)
BUN: 23 mg/dL — ABNORMAL HIGH (ref 6–20)
CO2: 29 mmol/L (ref 22–32)
Calcium: 8 mg/dL — ABNORMAL LOW (ref 8.9–10.3)
Chloride: 98 mmol/L (ref 98–111)
Creatinine, Ser: 0.66 mg/dL (ref 0.44–1.00)
GFR calc Af Amer: >60 mL/min (ref >60)
GFR calc non Af Amer: >60 mL/min (ref >60)
Glucose, Bld: 198 mg/dL — ABNORMAL HIGH (ref 70–99)
Potassium: 3.2 mmol/L — ABNORMAL LOW (ref 3.5–5.1)
Sodium: 144 mmol/L (ref 135–145)

## 2019-02-14 LAB — MAGNESIUM: Magnesium: 2.1 mg/dL (ref 1.7–2.4)

## 2019-02-14 MED ORDER — POTASSIUM CHLORIDE 20 MEQ PO PACK
40.0000 meq | PACK | ORAL | Status: AC
  Administered 2019-02-14 (×2): 40 meq via ORAL
  Filled 2019-02-14 (×2): qty 2

## 2019-02-14 NOTE — Progress Notes (Signed)
Patient able to reach 1000 mL using her incentive spirometer with coaching.

## 2019-02-14 NOTE — Progress Notes (Signed)
[id number] Dr. Shanon Brow paged for PICC replacement order.

## 2019-02-14 NOTE — Progress Notes (Signed)
Occupational Therapy Evaluation Patient Details Name: Anita Hunter MRN: [id number] DOB: 25-Oct-1990 Today's Date: 02/14/2019    History of Present Illness 29 y.o. female with medical history significant of chronic back pain, morbid obesity, eczema, seasonal allergies, sleep apnea, type 2 diabetes admitted with progressively worse shortness of breath. Intubated 12/19-12/30. 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 working on an degree in IT (information via family over the phone). Pt with significant weakness, LUE weaker than R (especially proximally) and requires Max A +2 for bed mobility. Will most likely need to progress OOB with use of Maximove. Pt demonstrates difficulty at times with sustained attention to task and appears to be hallucinating at times (her brother was in the room playing basketball). Cognition improved as session progressed. Pt able to assist with oral care and navigate iPad to pick out a movie she wanted to watch. Able to hold cup to drink but requires assistance with self feeding due to UE weakness. VSS on 10L. Left in upright chair position in bed. Recommend chair position TID and progress OOB to recliner with use of Maximove. Recommend CIR for rehab. Very appreciative and supportive family. Will follow acutely.     Follow Up Recommendations  CIR;Supervision/Assistance - 24 hour    Equipment Recommendations  3 in 1 bedside commode    Recommendations for Other Services Rehab consult     Precautions / Restrictions Precautions Precautions: Fall Precaution Comments: watch O2      Mobility Bed Mobility Overal bed mobility: Needs Assistance Bed Mobility: Rolling Rolling: Max assist;+2 for physical assistance         General bed mobility comments: Pt attempts to assist by reaching wiht RUE; more difficulty using  LUE  Transfers                 General transfer comment: not attmepted; will most lkely need MAximove to progress OOB    Balance Overall balance assessment: Needs assistance   Sitting balance-Leahy Scale: Poor Sitting balance - Comments: noted L Lateral lean; able to help push self upright using LUE but did not initiate Postural control: Left lateral lean                                 ADL either performed or assessed with clinical judgement   ADL Overall ADL's : Needs assistance/impaired Eating/Feeding: Moderate assistance Eating/Feeding Details (indicate cue type and reason): assist to hold utensil; able to bring cup to mouth; may benefit form red tubing and lidded cup due to hand weakness Grooming: Moderate assistance;Wash/dry face;Oral care Grooming Details (indicate cue type and reason): Able to bring toothbrush to mouth after set up but difficulty sustaining attention to task; left toothbrush handing in mouth and required tactile cues to complete; assist for sequencing Upper Body Bathing: Maximal assistance;Bed level   Lower Body Bathing: Total assistance;Bed level   Upper Body Dressing : Total assistance;Bed level   Lower Body Dressing: Total assistance;Bed level       Toileting- Clothing Manipulation and Hygiene: Total assistance       Functional mobility during ADLs: Maximal assistance;+2 for physical assistance(bed mobility) General ADL Comments: Pt engaign in ADL tasks; whispered that her breath smelled bad and wanted to brush her teeth     Vision Baseline Vision/History: No visual deficits  Perception     Praxis      Pertinent Vitals/Pain Pain Assessment: Faces Faces Pain Scale: Hurts little more Pain Location: buttocks Pain Descriptors / Indicators: Discomfort Pain Intervention(s): Limited activity within patient's tolerance     Hand Dominance Right   Extremity/Trunk Assessment Upper Extremity Assessment Upper  Extremity Assessment: RUE deficits/detail;LUE deficits/detail RUE Deficits / Details: generalized weakness; AROM grossly WFL with 3/5 strength throughtout but difficulty coordinating movement and bringing hand to mouth - some difficulty with this due to bed positioning and body habitus; difficulty with inhand manipulation skills making managing utneils, toothbrush, etc more difficult RUE Coordination: decreased fine motor;decreased gross motor LUE Deficits / Details: overall weaker than R; Increased weakness L shoulder; only @ 0-30 degrees active shoulder flexion; unable to place and hold shoulder @ 90, however also difficulty sustaining attention to task; increased difficulty wiht fine motor and in-hand manipulation skills; unable to bring hand to mouth due to weakness LUE Coordination: decreased fine motor;decreased gross motor   Lower Extremity Assessment Lower Extremity Assessment: Defer to PT evaluation   Cervical / Trunk Assessment Cervical / Trunk Assessment: Normal   Communication Communication Communication: Other (comment)(difficulty understanding pt due to her poor vocal quality)   Cognition Arousal/Alertness: Awake/alert Behavior During Therapy: Flat affect Overall Cognitive Status: Impaired/Different from baseline Area of Impairment: Orientation;Attention;Memory;Following commands;Safety/judgement;Awareness;Problem solving                 Orientation Level: Disoriented to;Place Current Attention Level: Sustained Memory: Decreased short-term memory Following Commands: Follows one step commands with increased time Safety/Judgement: Decreased awareness of deficits Awareness: Emergent Problem Solving: Slow processing General Comments: Pt appears delirious at times during session, asking about her brother bounding the ball in the room and saying that the "old man was scaring her"; pt also asked this therapist appropriate questions; able to navigate through iPad to watch  Anime movie   General Comments  Likes computers; excited aobut haivng iPad    Exercises Exercises: General Upper Extremity General Exercises - Upper Extremity Shoulder Flexion: AROM;Strengthening;Both;10 reps;Seated Shoulder ABduction: AROM;Strengthening;Both;10 reps;Seated Elbow Flexion: AROM;Both;10 reps;Seated Elbow Extension: AROM;Both;10 reps;Seated   Shoulder Instructions      Home Living Family/patient expects to be discharged to:: Private residence Living Arrangements: Spouse/significant other Available Help at Discharge: Family;Available 24 hours/day Type of Home: Apartment Home Access: Stairs to enter Entrance Stairs-Number of Steps: 14   Home Layout: One level     Bathroom Shower/Tub: Corporate investment banker: Standard Bathroom Accessibility: Yes How Accessible: Accessible via walker Home Equipment: None          Prior Functioning/Environment Level of Independence: Independent        Comments: Working at [employer];         OT Problem List: Decreased strength;Decreased range of motion;Decreased activity tolerance;Impaired balance (sitting and/or standing);Decreased coordination;Decreased cognition;Decreased safety awareness;Decreased knowledge of use of DME or AE;Cardiopulmonary status limiting activity;Obesity;Impaired UE functional use;Pain      OT Treatment/Interventions: Self-care/ADL training;Therapeutic exercise;Neuromuscular education;Energy conservation;DME and/or AE instruction;Therapeutic activities;Cognitive remediation/compensation;Patient/family education;Balance training    OT Goals(Current goals can be found in the care plan section) Acute Rehab OT Goals Patient Stated Goal: Per family to get stronger and return home OT Goal Formulation: With patient/family Time For Goal Achievement: 02/28/19 Potential to Achieve Goals: Good  OT Frequency: Min 3X/week   Barriers to D/C:            Co-evaluation  AM-PAC OT "6 Clicks" Daily Activity     Outcome Measure Help from another person eating meals?: A Lot Help from another person taking care of personal grooming?: A Lot Help from another person toileting, which includes using toliet, bedpan, or urinal?: Total Help from another person bathing (including washing, rinsing, drying)?: A Lot Help from another person to put on and taking off regular upper body clothing?: Total Help from another person to put on and taking off regular lower body clothing?: Total 6 Click Score: 9   End of Session Equipment Utilized During Treatment: Oxygen(10L)  Activity Tolerance: Patient tolerated treatment well Patient left: in bed(chair position)  OT Visit Diagnosis: Unsteadiness on feet (R26.81);Other abnormalities of gait and mobility (R26.89);Muscle weakness (generalized) (M62.81);Other symptoms and signs involving cognitive function;Pain Pain - part of body: (general discomfort)                Time: 0865-7846 OT Time Calculation (min): 59 min Charges:  OT General Charges $OT Visit: 1 Visit OT Evaluation $OT Eval High Complexity: 1 High OT Treatments $Self Care/Home Management : 23-37 mins $Therapeutic Activity: 8-22 mins  Maurie Boettcher, OT/L   Acute OT Clinical Specialist [place] Pager [phone] Office [phone]   [facility] 02/14/2019, 11:58 AM

## 2019-02-14 NOTE — Progress Notes (Signed)
NAME:  Anita Hunter, MRN:  [id number], DOB:  Jul 31, 1990, LOS: 11 ADMISSION DATE:  01/26/2019, CONSULTATION DATE:  01/31/19 REFERRING MD:  Avon Gully, CHIEF COMPLAINT:  breathlessness   Brief History   29 year old woman with severe OSA supposed to be on home BIPAP (issues with mask leaks), DM2, seasonal allergies, chronic back pain presenting with URI symptoms who has developed significant COVID pneumonia.  Transferred to ICU 12/19 for abrupt worsening O2 sats.  Subsequently intubated 12/20.   Past Medical History  DM2 Seasonal allergies Mild intermittent asthma OSA/OHS with recommended BIPAP 24/19 cm H2O Chronic back pain  Significant Hospital Events   12/14 admitted 12/19 transferred to ICU 12/20 intubated 12/25 vomiting, possible aspiration, abx initiated with fever  Consults:  PCCM  Procedures:  ETT 12/20 >> 12/30 RUE PICC 12/19 >>   Significant Diagnostic Tests:  LE Venous Duplex 12/21 >> negative for DVT bilaterally  Micro Data:  COVID 12/14 >> positive HIV 12/14 >> neg 12/25 urine > E. coli (pansensitive) 12/25 resp culture > MSSA 12/25 blood culture > negative 12/29 blood cultures >> 12/29 respiratory >> staph aureus >> 12/29 urine >> negative  Antimicrobials:  Actemra 12/17 Remdesivir 12/16 >> 12/20   Vanco 12/25 >> 12/28 Cefepime 12/25 >> 12/27 Ceftriaxone 12/27 >> 12/28 Cefazolin 12/28 >>   Interim history/subjective:  Remains extubated Wore BiPAP overnight Intermittent fever on 1/1  Objective   Blood pressure 126/70, pulse (!) 110, temperature (!) 100.4 F (38 C), temperature source Axillary, resp. rate (!) 29, height 5' 9"  (1.753 m), weight (!) 151 kg, SpO2 96 %.    Vent Mode: BIPAP Set Rate:  [24 bmp] 24 bmp   Intake/Output Summary (Last 24 hours) at 02/14/2019 0856 Last data filed at 02/14/2019 0600 Gross per 24 hour  Intake 932 ml  Output 801 ml  Net 131 ml   Filed Weights   02/08/19 0500 02/11/19 0500 02/14/19 0500  Weight: (!)  145.8 kg (!) 151 kg (!) 151 kg    Examination:  General: Comfortable woman lying in bed, weak but no distress HENT: Oropharynx clear, no stridor, soft voice but a bit stronger 1/2 PULM: Clear bilaterally, decreased at both bases CV: Regular, no murmur GI: Obese, nondistended, positive bowel sounds MSK: Trace pretibial edema Neuro: Awake, better able to communicate, still weak and speaks in broken sentences.  Strong cough on command.  Moves her upper extremities, barely moves her lower extremities  Resolved Hospital Problem list      Assessment & Plan:   Acute hypoxemic respiratory failure due to COVID pneumonia Complicated by HCAP  Chronic hypoxic and hypercapnic respiratory failure due to presumed OSA/OHS Continue to push pulmonary hygiene Mandatory BiPAP at night via a closed system.  Hopefully can transition to progressive care system when allowable.  She will need to work on CPAP compliance at night, mask fit Continue PT/OT Appreciate SLP evaluation clear for diet   Question of cardiomyopathy, due to viral process or possibly sepsis Plan to repeat her echocardiogram at some point during this hospitalization once stabilized, goal prior to discharge  Sedation Needs on Mechanical Ventilation, resolved Continue to hold all sedating medications if at all possible, avoid respiratory suppression  HCAP > MSSA UTI > pansensitive E. coli Sepsis secondary to above  Currently on cefazolin.  Both organisms should be sensitive.  Last positive culture was 12/29, will plan to complete 5 days post that result, stop date 1/2. Repeat culture data if she continues to have fever  DM II with hyperglycemia Adjust Levemir dosing as her oral intake improves May be able to restart linagliptin if her oral intake is adequate Sliding-scale insulin  AKI > resolved Hypokalemia  Mild hyponatremia Positive cumulative fluid balance Continue to follow BMP, urine output Replace potassium 1/2  Hold  off on diuresis 1/2, follow I/O.  Restart when enteral intake has normalized  Constipation > resolved   Best practice:  Diet: Heart healthy, carb modified Pain/Anxiety/Delirium protocol (if indicated): Completed VAP protocol (if indicated): Completed DVT prophylaxis: Lovenox 0.56m/kg BID  GI prophylaxis: pepcid Glucose control: SSI Mobility: BR Code Status: full code Family Communication: updated pt's sister Latoya 1/1.  Disposition: SDU status, may be able to go to floor bed to get BiPAP there     RBaltazar Apo MD, PhD 02/14/2019, 8:56 AM [place] [facility] [phone] if no answer [phone]

## 2019-02-15 DIAGNOSIS — Z8679 Personal history of other diseases of the circulatory system: Secondary | ICD-10-CM | POA: Clinically undetermined

## 2019-02-15 DIAGNOSIS — J9621 Acute and chronic respiratory failure with hypoxia: Secondary | ICD-10-CM | POA: Diagnosis present

## 2019-02-15 DIAGNOSIS — J690 Pneumonitis due to inhalation of food and vomit: Secondary | ICD-10-CM | POA: Diagnosis not present

## 2019-02-15 DIAGNOSIS — J1282 Pneumonia due to coronavirus disease 2019: Secondary | ICD-10-CM

## 2019-02-15 DIAGNOSIS — N390 Urinary tract infection, site not specified: Secondary | ICD-10-CM | POA: Diagnosis not present

## 2019-02-15 DIAGNOSIS — I5021 Acute systolic (congestive) heart failure: Secondary | ICD-10-CM | POA: Clinically undetermined

## 2019-02-15 DIAGNOSIS — J9622 Acute and chronic respiratory failure with hypercapnia: Secondary | ICD-10-CM | POA: Diagnosis present

## 2019-02-15 LAB — POCT I-STAT 7, (LYTES, BLD GAS, ICA,H+H)
Acid-Base Excess: 12 mmol/L — ABNORMAL HIGH (ref 0.0–2.0)
Bicarbonate: 37.2 mmol/L — ABNORMAL HIGH (ref 20.0–28.0)
Calcium, Ion: 1.12 mmol/L — ABNORMAL LOW (ref 1.15–1.40)
HCT: 31 % — ABNORMAL LOW (ref 36.0–46.0)
Hemoglobin: 10.5 g/dL — ABNORMAL LOW (ref 12.0–15.0)
O2 Saturation: 92 %
Patient temperature: 99
Potassium: 2.9 mmol/L — ABNORMAL LOW (ref 3.5–5.1)
Sodium: 140 mmol/L (ref 135–145)
TCO2: 39 mmol/L — ABNORMAL HIGH (ref 22–32)
pCO2 arterial: 49.7 mmHg — ABNORMAL HIGH (ref 32.0–48.0)
pH, Arterial: 7.483 — ABNORMAL HIGH (ref 7.350–7.450)
pO2, Arterial: 60 mmHg — ABNORMAL LOW (ref 83.0–108.0)

## 2019-02-15 LAB — GLUCOSE, CAPILLARY
Glucose-Capillary: 102 mg/dL — ABNORMAL HIGH (ref 70–99)
Glucose-Capillary: 140 mg/dL — ABNORMAL HIGH (ref 70–99)
Glucose-Capillary: 140 mg/dL — ABNORMAL HIGH (ref 70–99)
Glucose-Capillary: 148 mg/dL — ABNORMAL HIGH (ref 70–99)
Glucose-Capillary: 83 mg/dL (ref 70–99)

## 2019-02-15 LAB — BASIC METABOLIC PANEL
Anion gap: 10 (ref 5–15)
BUN: 20 mg/dL (ref 6–20)
CO2: 34 mmol/L — ABNORMAL HIGH (ref 22–32)
Calcium: 8.3 mg/dL — ABNORMAL LOW (ref 8.9–10.3)
Chloride: 100 mmol/L (ref 98–111)
Creatinine, Ser: 0.59 mg/dL (ref 0.44–1.00)
GFR calc Af Amer: >60 mL/min (ref >60)
GFR calc non Af Amer: >60 mL/min (ref >60)
Glucose, Bld: 159 mg/dL — ABNORMAL HIGH (ref 70–99)
Potassium: 3.1 mmol/L — ABNORMAL LOW (ref 3.5–5.1)
Sodium: 144 mmol/L (ref 135–145)

## 2019-02-15 LAB — PROCALCITONIN: Procalcitonin: 0.1 ng/mL

## 2019-02-15 LAB — CULTURE, BLOOD (ROUTINE X 2)
Culture: NO GROWTH
Culture: NO GROWTH
Special Requests: ADEQUATE
Special Requests: ADEQUATE

## 2019-02-15 LAB — BRAIN NATRIURETIC PEPTIDE: B Natriuretic Peptide: 29.7 pg/mL (ref 0.0–100.0)

## 2019-02-15 LAB — MAGNESIUM: Magnesium: 1.9 mg/dL (ref 1.7–2.4)

## 2019-02-15 MED ORDER — POTASSIUM CHLORIDE CRYS ER 20 MEQ PO TBCR: 40.0000 meq | EXTENDED_RELEASE_TABLET | Freq: Once | ORAL | Status: DC

## 2019-02-15 MED ORDER — POTASSIUM CHLORIDE CRYS ER 20 MEQ PO TBCR
40.0000 meq | EXTENDED_RELEASE_TABLET | Freq: Two times a day (BID) | ORAL | Status: AC
  Administered 2019-02-15 (×2): 40 meq via ORAL
  Filled 2019-02-15: qty 2

## 2019-02-15 NOTE — Progress Notes (Signed)
Spoke with Lilia Pro RN re IV Team consult placed.  Lilia Pro confirms has PIV working well and adequate for meds ordered.  Lilia Pro to speak with dr [place] Pink re need for IR placed PICC vs maintaining PIV currently in.

## 2019-02-15 NOTE — Progress Notes (Signed)
Pt placed on bipap for about 15 mins before she started pulling the mask off.  Pt seemed confused when replacing mask and started pulling at it again.  This is after she requested it herself to her nurse.  RT attempted to replace mask and pt was pulling and trying to block RT from placing.  Pt was placed back on [place] at this time d/t desat while trying to replace mask.  Pt's hair also an issue when applying mask.  RT will attempt to replace mask on pt if she becomes distressed.  RT will monitor.

## 2019-02-15 NOTE — Progress Notes (Signed)
   02/15/19 1500  Intensive Care Delirium Screening Checklist (ICDSC)  Altered level of consciousness 1.0  Inattention 1  Disorientation 1  Hallucinations, Delusion, or Psychosis 1  Psychomotor agitation or retardation 1  Inappropriate speech or mood 1  Sleep/wake cycle disturbance 1  Symptom fluctuation 1  ICDSC Score 8  Dr. [place] Pink aware of delirium

## 2019-02-15 NOTE — Progress Notes (Signed)
PROGRESS NOTE  Anita Hunter QJJ:[id number] DOB: [birth year] DOA: 01/26/2019 PCP: [name], MD  HPI/Recap of past 56 hours: 29 year old female with past medical history of morbid obesity, diabetes mellitus and obstructive sleep apnea (who has had issues with leaking mask) admitted on 12/14 with Covid pneumonia and then transferred to the ICU on 12/19 for worsening hypoxia.  She ended up being intubated on 12/20.  On 12/25, elevated white blood cell count and fever and patient had episode of vomiting concerning for aspiration pneumonia.  Also noted to have UTI.  Patient also had an echocardiogram done 12/25 which noted mildly decreased ejection fraction of 40-45%.  Patient started on broad-spectrum antibiotics.  She was able to be extubated on 12/30.  She is continued on high flow oxygen and then BiPAP at night.  Since extubation, she has been seen by speech therapy.  Her voice remains hoarse but audible.  She tolerated ice and sips without aspiration, as well as liquids, but did not want any solids yet.  Overall still very weak, seen by Occupational Therapy who recommended inpatient rehab.  Patient transferred back from critical care service to the hospital service on 1/3.  Today, patient is still quite fatigued.  She is being moved out of the ICU.  Assessment/Plan: Principal Problem:   COVID-19 pneumonia with acute on chronic respiratory failure with hypoxia and hypercarbia causing ARDS, course complicated by aspiration pneumonia and chronic underlying obstructive sleep apnea: Status post intubation and on ventilator from 12/20-12/30.  Able to be extubated.  Status post full course of remdesivir, Actemra x1 dose.  BiPAP at night.  Completed course of antibiotics for aspiration pneumonia. Active Problems:   Morbid obesity: Patient is criteria BMI greater than 40.    Type 2 diabetes mellitus (HCC) with hyperglycemia: Adjusting Levemir.  Once her oral intake increases, will need to adjust  further.  AKI: Secondary to acute illness.  Resolved with fluids.    Hypomagnesemia: Replace as needed.  E. coli UTI: Pansensitive.  Completed 5-day course of Ancef.    NASH (nonalcoholic steatohepatitis): Secondary to obesity.  Will need close outpatient follow-up.  Current liver function tests are stable.    Hypertriglyceridemia: Continue meds.  Constipation: Resolved.  Questionable acute on chronic systolic heart failure/cardiomyopathy: Currently euvolemic.  This is due to a viral process or possibly sepsis.  We will plan to recheck echocardiogram when she is in a better respiratory standpoint  Code Status: Full code  Family Communication: Left message for mother  Disposition Plan: Transfer out of ICU.  Hopefully will be a candidate for inpatient rehab.   Consultants:  Critical care  Procedures:  Echocardiogram done 12/25: Slightly decreased ejection fraction of 40-45%  Intubated 12/20-12/30  PICC line placed 12/19  Antimicrobials: Actemra 12/17 Remdesivir 12/16 >> 12/20   Vanco 12/25 >> 12/28 Cefepime 12/25 >> 12/27 Ceftriaxone 12/27 >> 12/28 Cefazolin 12/28 >>  1/2  DVT prophylaxis: Lovenox   Objective: Vitals:   02/15/19 1100 02/15/19 1200  BP: 97/81 130/75  Pulse: (!) 110 (!) 109  Resp: (!) 25   Temp:  99.2 F (37.3 C)  SpO2: 92% 96%    Intake/Output Summary (Last 24 hours) at 02/15/2019 1301 Last data filed at 02/14/2019 2300 Gross per 24 hour  Intake --  Output 975 ml  Net -975 ml   Filed Weights   02/08/19 0500 02/11/19 0500 02/14/19 0500  Weight: (!) 145.8 kg (!) 151 kg (!) 151 kg   Body mass index is 49.16 kg/m.  Exam:   General: Alert and oriented x2, no immediate distress  HEENT: Normocephalic atraumatic, mucous membranes slightly dry  Neck: Thick, narrow airway  Cardiovascular: Soft, regular rate and rhythm, S1-S2  Respiratory: Decreased breath sounds throughout secondary to body habitus  Abdomen: Soft, obese, nontender,  hypoactive bowel sounds  Musculoskeletal: No clubbing or cyanosis, 1+ pitting edema from the knees down bilaterally  Skin: No skin breaks, tears or lesions  Psychiatry: Appropriate, no evidence of psychoses  Neuro: No focal deficits, but subjective weakness, but nonfocal   Data Reviewed: CBC: Recent Labs  Lab 02/09/19 0520 02/10/19 0954 02/11/19 0518 02/12/19 0400 02/13/19 0300 02/14/19 0437  WBC 11.2* 11.3* 12.2* 11.9* 11.7* 9.0  NEUTROABS 7.0  --   --   --   --   --   HGB 11.9* 10.9* 11.2* 11.7* 11.7* 11.5*  HCT 42.9 38.5 40.0 40.4 40.1 39.4  MCV 97.5 98.2 98.3 94.2 94.8 94.7  PLT 149* 130* 136* 143* 141* 542*   Basic Metabolic Panel: Recent Labs  Lab 02/11/19 0518 02/11/19 1438 02/12/19 0400 02/13/19 0300 02/14/19 0635  NA 146* 147* 149* 147* 144  K 3.7 2.9* 3.3* 3.4* 3.2*  CL 102 98 97* 98 98  CO2 33* 37* 37* 35* 29  GLUCOSE 315* 153* 141* 208* 198*  BUN 39* 34* 35* 29* 23*  CREATININE 0.87 0.82 0.85 0.86 0.66  CALCIUM 8.5* 8.8* 8.6* 8.3* 8.0*  MG  --  1.8  --   --  2.1  PHOS  --  3.8  --   --   --    GFR: Estimated Creatinine Clearance: 166.9 mL/min (by C-G formula based on SCr of 0.66 mg/dL). Liver Function Tests: Recent Labs  Lab 02/10/19 0954 02/11/19 0518 02/12/19 0400  AST 70* 70* 108*  ALT 52* 49* 52*  ALKPHOS 35* 36* 34*  BILITOT 0.6 0.6 1.0  PROT 5.6* 6.2* 6.2*  ALBUMIN 2.9* 3.1* 3.4*   No results for input(s): LIPASE, AMYLASE in the last 168 hours. No results for input(s): AMMONIA in the last 168 hours. Coagulation Profile: No results for input(s): INR, PROTIME in the last 168 hours. Cardiac Enzymes: No results for input(s): CKTOTAL, CKMB, CKMBINDEX, TROPONINI in the last 168 hours. BNP (last 3 results) No results for input(s): PROBNP in the last 8760 hours. HbA1C: No results for input(s): HGBA1C in the last 72 hours. CBG: Recent Labs  Lab 02/14/19 1250 02/14/19 1547 02/15/19 0133 02/15/19 0444 02/15/19 0752  GLUCAP 185*  186* 140* 148* 140*   Lipid Profile: No results for input(s): CHOL, HDL, LDLCALC, TRIG, CHOLHDL, LDLDIRECT in the last 72 hours. Thyroid Function Tests: No results for input(s): TSH, T4TOTAL, FREET4, T3FREE, THYROIDAB in the last 72 hours. Anemia Panel: No results for input(s): VITAMINB12, FOLATE, FERRITIN, TIBC, IRON, RETICCTPCT in the last 72 hours. Urine analysis:    Component Value Date/Time   COLORURINE YELLOW 11/27/2018 2358   APPEARANCEUR CLOUDY (A) 11/27/2018 2358   LABSPEC 1.025 11/27/2018 2358   PHURINE 6.0 11/27/2018 2358   GLUCOSEU NEGATIVE 11/27/2018 2358   HGBUR NEGATIVE 11/27/2018 2358   BILIRUBINUR NEGATIVE 11/27/2018 2358   KETONESUR 15 (A) 11/27/2018 2358   PROTEINUR NEGATIVE 11/27/2018 2358   NITRITE NEGATIVE 11/27/2018 2358   LEUKOCYTESUR NEGATIVE 11/27/2018 2358   Sepsis Labs: _0 (procalcitonin:4,lacticidven:4)  ) Recent Results (from the past 240 hour(s))  Culture, Urine     Status: Abnormal   Collection Time: 02/06/19  4:44 AM   Specimen: Urine, Random  Result Value Ref  Range Status   Specimen Description   Final    URINE, RANDOM Performed at [facility], [place] [address], [place], [place] [address]    Special Requests   Final    NONE Performed at [facility], [place] [address], [place], [place] [address]    Culture >=100,000 COLONIES/mL ESCHERICHIA COLI (A)  Final   Report Status 02/08/2019 FINAL  Final   Organism ID, Bacteria ESCHERICHIA COLI (A)  Final      Susceptibility   Escherichia coli - MIC*    AMPICILLIN 4 SENSITIVE Sensitive     CEFAZOLIN <=4 SENSITIVE Sensitive     CEFTRIAXONE <=1 SENSITIVE Sensitive     CIPROFLOXACIN <=0.25 SENSITIVE Sensitive     GENTAMICIN <=1 SENSITIVE Sensitive     IMIPENEM <=0.25 SENSITIVE Sensitive     NITROFURANTOIN <=16 SENSITIVE Sensitive     TRIMETH/SULFA <=20 SENSITIVE Sensitive     AMPICILLIN/SULBACTAM <=2 SENSITIVE Sensitive     PIP/TAZO <=4 SENSITIVE  Sensitive     * >=100,000 COLONIES/mL ESCHERICHIA COLI  Culture, blood (routine x 2)     Status: None   Collection Time: 02/06/19  7:15 AM   Specimen: BLOOD  Result Value Ref Range Status   Specimen Description   Final    BLOOD LEFT HAND Performed at [place] [address] [place], [place] [address]    Special Requests   Final    BOTTLES DRAWN AEROBIC ONLY Blood Culture adequate volume Performed at [place] [address] [place], [place] [address]    Culture   Final    NO GROWTH 5 DAYS Performed at [facility], [place] [address] [place], [place] [address]    Report Status 02/11/2019 FINAL  Final  Culture, blood (routine x 2)     Status: None   Collection Time: 02/06/19  7:18 AM   Specimen: BLOOD  Result Value Ref Range Status   Specimen Description   Final    BLOOD LEFT ANTECUBITAL Performed at [place] [address] [place], [place] [address]    Special Requests   Final    BOTTLES DRAWN AEROBIC ONLY Blood Culture adequate volume Performed at [place] [address], [place], [place] [address]    Culture   Final    NO GROWTH 5 DAYS Performed at [facility], [place] [address], [place], [place] [address]    Report Status 02/11/2019 FINAL  Final  Culture, respiratory (non-expectorated)     Status: None   Collection Time: 02/06/19  5:30 PM   Specimen: Tracheal Aspirate; Respiratory  Result Value Ref Range Status   Specimen Description   Final    TRACHEAL ASPIRATE Performed at [place] [address], [place], [place] [address]    Special Requests   Final    NONE Performed at [facility], [place] [address] [place], [place] [address]    Gram Stain   Final    ABUNDANT WBC PRESENT,BOTH PMN AND MONONUCLEAR ABUNDANT GRAM POSITIVE COCCI Performed at [facility], [place] [address] [place], [place] [address]    Culture  MODERATE STAPHYLOCOCCUS AUREUS  Final   Report Status 02/09/2019 FINAL  Final   Organism ID, Bacteria STAPHYLOCOCCUS AUREUS  Final      Susceptibility   Staphylococcus aureus - MIC*    CIPROFLOXACIN <=0.5 SENSITIVE Sensitive     ERYTHROMYCIN <=0.25 SENSITIVE Sensitive  GENTAMICIN <=0.5 SENSITIVE Sensitive     OXACILLIN <=0.25 SENSITIVE Sensitive     TETRACYCLINE <=1 SENSITIVE Sensitive     VANCOMYCIN <=0.5 SENSITIVE Sensitive     TRIMETH/SULFA <=10 SENSITIVE Sensitive     CLINDAMYCIN <=0.25 SENSITIVE Sensitive     RIFAMPIN <=0.5 SENSITIVE Sensitive     Inducible Clindamycin NEGATIVE Sensitive     * MODERATE STAPHYLOCOCCUS AUREUS  Urine culture     Status: None   Collection Time: 02/10/19  3:15 PM   Specimen: Urine, Catheterized  Result Value Ref Range Status   Specimen Description   Final    URINE, CATHETERIZED Performed at [facility], [place] [address] [place], [place] [address]    Special Requests   Final    NONE Performed at [facility], [place] [address] [place], [place] [address]    Culture   Final    NO GROWTH Performed at [facility], [place] [address] [place], [place] [address]    Report Status 02/11/2019 FINAL  Final  Culture, blood (routine x 2)     Status: None   Collection Time: 02/10/19  3:30 PM   Specimen: BLOOD  Result Value Ref Range Status   Specimen Description   Final    BLOOD LEFT HAND Performed at [facility] [address] [place], [place] [address]    Special Requests   Final    BOTTLES DRAWN AEROBIC ONLY Blood Culture adequate volume Performed at [place] [address] [place], [place] [address]    Culture   Final    NO GROWTH 5 DAYS Performed at [facility], [place] [address], [place], [place] [address]    Report Status 02/15/2019 FINAL  Final  Culture, blood (routine x 2)     Status: None   Collection Time: 02/10/19  3:40 PM   Specimen: BLOOD    Result Value Ref Range Status   Specimen Description   Final    BLOOD LEFT HAND Performed at [facility] [address] [place], [place] [address]    Special Requests   Final    BOTTLES DRAWN AEROBIC ONLY Blood Culture adequate volume Performed at [place] [address] [place], [place] [address]    Culture   Final    NO GROWTH 5 DAYS Performed at [facility], [place] [address] [place], [place] [address]    Report Status 02/15/2019 FINAL  Final  Culture, respiratory (non-expectorated)     Status: None   Collection Time: 02/10/19  5:34 PM   Specimen: Tracheal Aspirate; Respiratory  Result Value Ref Range Status   Specimen Description   Final    TRACHEAL ASPIRATE Performed at [facility] [address], [place], [place] [address]    Special Requests   Final    NONE Performed at [facility], [place] [address] [place], [place] [address]    Gram Stain   Final    ABUNDANT WBC PRESENT, PREDOMINANTLY PMN RARE GRAM POSITIVE COCCI Performed at [facility], [place] [address] [place],  [address]    Culture RARE STAPHYLOCOCCUS AUREUS  Final   Report Status 02/13/2019 FINAL  Final   Organism ID, Bacteria STAPHYLOCOCCUS AUREUS  Final      Susceptibility   Staphylococcus aureus - MIC*    CIPROFLOXACIN <=0.5 SENSITIVE Sensitive     ERYTHROMYCIN <=0.25 SENSITIVE Sensitive  GENTAMICIN <=0.5 SENSITIVE Sensitive     OXACILLIN 0.5 SENSITIVE Sensitive     TETRACYCLINE <=1 SENSITIVE Sensitive     VANCOMYCIN 1 SENSITIVE Sensitive     TRIMETH/SULFA <=10 SENSITIVE Sensitive     CLINDAMYCIN <=0.25 SENSITIVE Sensitive     RIFAMPIN <=0.5 SENSITIVE Sensitive     Inducible Clindamycin NEGATIVE Sensitive     * RARE STAPHYLOCOCCUS AUREUS      Studies: No results found.  Scheduled Meds: . chlorhexidine  15 mL Mouth/Throat BID  . Chlorhexidine Gluconate Cloth  6 each Topical Daily  .  enoxaparin (LOVENOX) injection  70 mg Subcutaneous BID  . insulin aspart  0-20 Units Subcutaneous Q4H  . insulin aspart  6 Units Subcutaneous TID WC  . insulin detemir  35 Units Subcutaneous BID  . insulin starter kit- pen needles  1 kit Other Once  . magnesium hydroxide  15 mL Oral Daily  . sodium chloride flush  10-40 mL Intracatheter Q12H    Continuous Infusions: . sodium chloride 10 mL/hr at 02/13/19 1800  . sodium chloride 10 mL/hr at 02/13/19 1800  . famotidine (PEPCID) IV 20 mg (02/15/19 0925)     LOS: 20 days     [name], MD [facility]  To reach me or the doctor on call, go to: [email or web address] Password [email or web address]  02/15/2019, 1:01 PM

## 2019-02-16 DIAGNOSIS — I5021 Acute systolic (congestive) heart failure: Secondary | ICD-10-CM

## 2019-02-16 DIAGNOSIS — N390 Urinary tract infection, site not specified: Secondary | ICD-10-CM

## 2019-02-16 LAB — CBC
HCT: 38.7 % (ref 36.0–46.0)
Hemoglobin: 11.6 g/dL — ABNORMAL LOW (ref 12.0–15.0)
MCH: 28.4 pg (ref 26.0–34.0)
MCHC: 30 g/dL (ref 30.0–36.0)
MCV: 94.6 fL (ref 80.0–100.0)
Platelets: 165 10*3/uL (ref 150–400)
RBC: 4.09 MIL/uL (ref 3.87–5.11)
RDW: 14.7 % (ref 11.5–15.5)
WBC: 6.9 10*3/uL (ref 4.0–10.5)
nRBC: 0 % (ref 0.0–0.2)

## 2019-02-16 LAB — COMPREHENSIVE METABOLIC PANEL
ALT: 36 U/L (ref 0–44)
AST: 64 U/L — ABNORMAL HIGH (ref 15–41)
Albumin: 3 g/dL — ABNORMAL LOW (ref 3.5–5.0)
Alkaline Phosphatase: 40 U/L (ref 38–126)
Anion gap: 10 (ref 5–15)
BUN: 15 mg/dL (ref 6–20)
CO2: 32 mmol/L (ref 22–32)
Calcium: 8.3 mg/dL — ABNORMAL LOW (ref 8.9–10.3)
Chloride: 99 mmol/L (ref 98–111)
Creatinine, Ser: 0.66 mg/dL (ref 0.44–1.00)
GFR calc Af Amer: >60 mL/min (ref >60)
GFR calc non Af Amer: >60 mL/min (ref >60)
Glucose, Bld: 165 mg/dL — ABNORMAL HIGH (ref 70–99)
Potassium: 3.7 mmol/L (ref 3.5–5.1)
Sodium: 141 mmol/L (ref 135–145)
Total Bilirubin: 1 mg/dL (ref 0.3–1.2)
Total Protein: 6.5 g/dL (ref 6.5–8.1)

## 2019-02-16 LAB — C-REACTIVE PROTEIN: CRP: 7.6 mg/dL — ABNORMAL HIGH (ref <1.0)

## 2019-02-16 LAB — GLUCOSE, CAPILLARY
Glucose-Capillary: 159 mg/dL — ABNORMAL HIGH (ref 70–99)
Glucose-Capillary: 160 mg/dL — ABNORMAL HIGH (ref 70–99)
Glucose-Capillary: 130 mg/dL — ABNORMAL HIGH (ref 70–99)
Glucose-Capillary: 131 mg/dL — ABNORMAL HIGH (ref 70–99)
Glucose-Capillary: 114 mg/dL — ABNORMAL HIGH (ref 70–99)
Glucose-Capillary: 148 mg/dL — ABNORMAL HIGH (ref 70–99)
Glucose-Capillary: 122 mg/dL — ABNORMAL HIGH (ref 70–99)
Glucose-Capillary: 168 mg/dL — ABNORMAL HIGH (ref 70–99)

## 2019-02-16 LAB — MAGNESIUM: Magnesium: 1.8 mg/dL (ref 1.7–2.4)

## 2019-02-16 MED ORDER — ENSURE ENLIVE PO LIQD
237.0000 mL | Freq: Three times a day (TID) | ORAL | Status: DC
  Administered 2019-02-16 – 2019-02-19 (×4): 237 mL via ORAL

## 2019-02-16 MED ORDER — PANTOPRAZOLE SODIUM 40 MG PO TBEC
40.0000 mg | DELAYED_RELEASE_TABLET | Freq: Every day | ORAL | Status: DC
  Administered 2019-02-18 – 2019-02-20 (×3): 40 mg via ORAL
  Filled 2019-02-16 (×4): qty 1

## 2019-02-16 MED ORDER — PRO-STAT SUGAR FREE PO LIQD
30.0000 mL | Freq: Three times a day (TID) | ORAL | Status: DC
  Administered 2019-02-17 – 2019-02-19 (×7): 30 mL via ORAL
  Filled 2019-02-16 (×6): qty 30

## 2019-02-16 MED ORDER — ADULT MULTIVITAMIN W/MINERALS CH
1.0000 | ORAL_TABLET | Freq: Every day | ORAL | Status: DC
  Administered 2019-02-17 – 2019-02-20 (×4): 1 via ORAL
  Filled 2019-02-16 (×4): qty 1

## 2019-02-16 MED ORDER — FAMOTIDINE 20 MG PO TABS
20.0000 mg | ORAL_TABLET | Freq: Every day | ORAL | Status: DC
  Administered 2019-02-17 – 2019-02-20 (×4): 20 mg via ORAL
  Filled 2019-02-16 (×4): qty 1

## 2019-02-16 NOTE — Evaluation (Signed)
Physical Therapy Evaluation Patient Details Name: Anita Hunter MRN: [id number] DOB: 1990-05-06 Today's Date: 02/16/2019   History of Present Illness  29 y.o. female with medical history significant of chronic back pain, morbid obesity, eczema, seasonal allergies, sleep apnea, type 2 diabetes admitted with progressively worse shortness of breath. Intubated 12/19-12/30. CXR Worsening patchy opacities throughout both lungs  Clinical Impression  The patient is alert and able to follow one step directions, easily distracted with activity in room. The patient able to participate and assist in LE exercises and repositioning in bed. Evaluation cur short as patient being transferred to another  Floor. Continue mobility assessment tomorrow. Pt admitted with above diagnosis.  Pt currently with functional limitations due to the deficits listed below (see PT Problem List). Pt will benefit from skilled PT to increase their independence and safety with mobility to allow discharge to the venue listed below.    Patient SPO2 on 10 L HFNC 95%. HR 110.      Follow Up Recommendations CIR;Supervision/Assistance - 24 hour    Equipment Recommendations  (TBD)    Recommendations for Other Services Rehab consult     Precautions / Restrictions Precautions Precautions: Fall Precaution Comments: watch O2, RL seems weaker, RN tried standing "briefly" tpday      Mobility  Bed Mobility               General bed mobility comments: Pt attempts to assist by reaching wiht RUE; more difficulty using LUE  Transfers                 General transfer comment: not attmepted; will most lkely need MAximove to progress OOB  Ambulation/Gait                Stairs            Wheelchair Mobility    Modified Rankin (Stroke Patients Only)       Balance     Sitting balance-Leahy Scale: Poor Sitting balance - Comments: noted L Lateral lean; able to help push self upright using LUE but did  not initiate Postural control: Left lateral lean                                   Pertinent Vitals/Pain Faces Pain Scale: Hurts little more Pain Location: buttocks Pain Descriptors / Indicators: Discomfort Pain Intervention(s): Monitored during session;Repositioned    Home Living Family/patient expects to be discharged to:: Private residence Living Arrangements: Spouse/significant other Available Help at Discharge: Family;Available 24 hours/day Type of Home: Apartment Home Access: Stairs to enter   Entrance Stairs-Number of Steps: 14 Home Layout: One level Home Equipment: None      Prior Function Level of Independence: Independent         Comments: Working at [employer];      [employer]   Dominant Hand: Right    Extremity/Trunk Assessment   Upper Extremity Assessment Upper Extremity Assessment: Defer to OT evaluation    Lower Extremity Assessment Lower Extremity Assessment: RLE deficits/detail;LLE deficits/detail RLE Deficits / Details: 1/5 hip flexion, 1+/5 knee extension, abd. reports foot feels numb, appreciates LT, did not fully test due to MS, dorsiflex is 1 LLE Deficits / Details: hip flex 2/5. knee ext 2/5.  Abd 2/5, relates some numbness but not as much as right, dorsiflex/plantar is 2+    Cervical / Trunk Assessment Cervical / Trunk Assessment: (not tested as did not  get to sit up)  Communication   Communication: Other (comment)(difficulty understanding due to poor vocal quality)  Cognition Arousal/Alertness: Awake/alert Behavior During Therapy: Flat affect Overall Cognitive Status: Impaired/Different from baseline Area of Impairment: Orientation;Attention;Memory;Following commands;Safety/judgement;Awareness;Problem solving                 Orientation Level: Disoriented to;Time Current Attention Level: Sustained Memory: Decreased short-term memory Following Commands: Follows one step commands with increased  time Safety/Judgement: Decreased awareness of deficits Awareness: Emergent Problem Solving: Slow processing General Comments: at times would stop task and look around to other activity in room.      General Comments      Exercises General Exercises - Lower Extremity Heel Slides: AAROM;Both;10 reps;Supine Hip ABduction/ADduction: AAROM;Both;Supine   Assessment/Plan    PT Assessment Patient needs continued PT services  PT Problem List Decreased strength;Decreased mobility;Decreased safety awareness;Decreased activity tolerance;Decreased cognition;Cardiopulmonary status limiting activity;Decreased balance       PT Treatment Interventions Cognitive remediation;Therapeutic activities;DME instruction;Gait training;Therapeutic exercise;Patient/family education;Balance training;Functional mobility training;Wheelchair mobility training;Neuromuscular re-education    PT Goals (Current goals can be found in the Care Plan section)  Acute Rehab PT Goals Patient Stated Goal: patient agreed to exercises, get stronger PT Goal Formulation: With patient/family Time For Goal Achievement: 03/02/19 Potential to Achieve Goals: Good    Frequency Min 3X/week   Barriers to discharge        Co-evaluation               AM-PAC PT "6 Clicks" Mobility  Outcome Measure Help needed turning from your back to your side while in a flat bed without using bedrails?: Total Help needed moving from lying on your back to sitting on the side of a flat bed without using bedrails?: Total Help needed moving to and from a bed to a chair (including a wheelchair)?: Total Help needed standing up from a chair using your arms (e.g., wheelchair or bedside chair)?: Total Help needed to walk in hospital room?: Total Help needed climbing 3-5 steps with a railing? : Total 6 Click Score: 6    End of Session Equipment Utilized During Treatment: Oxygen Activity Tolerance: Patient tolerated treatment well Patient left:  in bed;with call bell/phone within reach;with nursing/sitter in room Nurse Communication: Mobility status;Need for lift equipment PT Visit Diagnosis: Unsteadiness on feet (R26.81);Difficulty in walking, not elsewhere classified (R26.2)    Time: 9417-4081 PT Time Calculation (min) (ACUTE ONLY): 26 min   Charges:   PT Evaluation $PT Eval Moderate Complexity: 1 Mod PT Treatments $Therapeutic Exercise: 8-22 mins        Tresa Endo PT Acute Rehabilitation Services Pager [phone] Office [phone]    Claretha Cooper 02/16/2019, 5:15 PM

## 2019-02-16 NOTE — Progress Notes (Signed)
Family updated of situation and plan.  All questions answered.  Family updated of plan to transfer to Progressive care.  Report called to [facility], RN and patient transferred on bariatric bed.

## 2019-02-16 NOTE — Progress Notes (Signed)
1700: Pt arrived to room, placed on bedside monitor and VS obtained. SpO2 100% on 10 L HFNC. Denies pain. Purewick in place.  1800: Assisted Pt onto bedpan, full bed change, and then set up for dinner. Denies other needs at this time, will monitor

## 2019-02-16 NOTE — Progress Notes (Signed)
 Nutrition Follow-up  DOCUMENTATION CODES:   Morbid obesity  INTERVENTION:   Recommend liberalizing diet to REGULAR to expand food choices while pt with poor appetite  Ensure Enlive po TID, each supplement provides 350 kcal and 20 grams of protein, as pt is eating only 10% of meal trays, poor appeitite  30 ml Prostat TID, each supplement provides 100 kcals and 15 grams protein.   Add MVI with [place]   NUTRITION DIAGNOSIS:   Inadequate oral intake related to acute illness as evidenced by NPO status.  Being addressed via supplements  GOAL:   Patient will meet greater than or equal to 90% of their needs  Progressing  MONITOR:   Vent status, TF tolerance, Skin, Labs, Weight trends  REASON FOR ASSESSMENT:   Ventilator, Consult Enteral/tube feeding initiation and management  ASSESSMENT:   29 yo female admitted with severe COVID pneumonia on 12/14 ultimately requiring intubation on 12/20. PMH includes DM, seasonal allergies, asthma, OSA/OHS  12/14 Admit 12/20 Intubated 12/21 TF initiated 12/25 TF held secondary to large volume emesis, proned 12/26 no further need for proning 12/27 start to wean sedation, wean PEEP, no prone needed, diurese today with lasix 12/28 rectal tube placed due to loose stools 12/30 Extubated 01/01 CL diet 01/02 Heart Healthy/Carb Modified  Currently on 10 L HFNC  No recorded po intake since 12/17. Spoke with RN who indicates pt with very little appetite, drinking water. Pt indicating pt only ate 10% at breakfast and lunch  Weight down to 147 kg; admit weight 164 kg. Net negative 4 L. Based on poor appetite in setting of catabolic illness, suspect pt has lost weight (not just water weight)  Labs: reviewed; CBGs 83-148 (ICU goal 140-180) Meds: ss novolog, novolog with meals, levemir BID   Diet Order:   Diet Order            Diet heart healthy/carb modified Room service appropriate? Yes; Fluid consistency: Thin  Diet effective now              EDUCATION NEEDS:   Not appropriate for education at this time  Skin:  Skin Assessment: Skin Integrity Issues: Skin Integrity Issues:: Other (Comment) Other: MASD: buttocks  Last BM:  1/04 rectal tube  Height:   Ht Readings from Last 1 Encounters:  01/29/19 5' 9"  (1.753 m)    Weight:   Wt Readings from Last 1 Encounters:  02/16/19 (!) 147 kg    Ideal Body Weight:  65.9 kg(adjusted weight 101 kg)  BMI:  Body mass index is 47.85 kg/m.  Estimated Nutritional Needs:   Kcal:  2500-2800 kcals  Protein:  132-165 g  Fluid:  >/= 2L    Navaeh Kehres MS, RDN, LDN, CNSC [phone] Pager  [phone] Weekend/On-Call Pager

## 2019-02-16 NOTE — Progress Notes (Signed)
PROGRESS NOTE  Anita Hunter IFB:[id number] DOB: 03-05-1990 DOA: 01/26/2019 PCP: [name], MD  HPI/Recap of past 41 hours: 29 year old female with past medical history of morbid obesity, diabetes mellitus and obstructive sleep apnea (who has had issues with leaking mask) admitted on 12/14 with Covid pneumonia and then transferred to the ICU on 12/19 for worsening hypoxia.  She ended up being intubated on 12/20.  On 12/25, elevated white blood cell count and fever and patient had episode of vomiting concerning for aspiration pneumonia.  Also noted to have UTI.  Patient also had an echocardiogram done 12/25 which noted mildly decreased ejection fraction of 40-45%.  Patient started on broad-spectrum antibiotics.  She was able to be extubated on 12/30.  She is continued on high flow oxygen and then BiPAP at night.  Since extubation, she has been seen by speech therapy.  Her voice remains hoarse but audible.  She tolerated ice and sips without aspiration, as well as liquids, but did not want any solids yet.  Overall still very weak, seen by Occupational Therapy who recommended inpatient rehab.  Patient transferred back from critical care service to the hospital service on 1/3.  Today, patient is still quite fatigued.  She becomes easily quite tachypneic with respiratory rate in the 40s to 50s, although she says that she chronically breathes that fast.  She herself says that she is comfortable.  Assessment/Plan: Principal Problem:   COVID-19 pneumonia with acute on chronic respiratory failure with hypoxia and hypercarbia causing ARDS, course complicated by aspiration pneumonia and chronic underlying obstructive sleep apnea: Status post intubation and on ventilator from 12/20-12/30.  Able to be extubated.  Status post full course of remdesivir, Actemra x1 dose.  BiPAP at night.  Completed course of antibiotics for aspiration pneumonia.  Now working on weaning down her oxygen Active Problems:    Morbid obesity: Patient is criteria BMI greater than 40.    Type 2 diabetes mellitus (HCC) with hyperglycemia: Adjusting Levemir.  Once her oral intake increases, will need to adjust further.  CBGs have remained stable  AKI: Secondary to acute illness.  Resolved with fluids.    Hypomagnesemia: Replace as needed.  E. coli UTI: Pansensitive.  Completed 5-day course of Ancef.    NASH (nonalcoholic steatohepatitis): Secondary to obesity.  Will need close outpatient follow-up.  Current liver function tests are stable.    Hypertriglyceridemia: Continue meds.  Constipation: Resolved.  Questionable acute on chronic systolic heart failure/cardiomyopathy: Currently euvolemic.  This is due to a viral process or possibly sepsis.  We will plan to recheck echocardiogram when she is in a better respiratory standpoint  Code Status: Full code  Family Communication: Left message for mother  Disposition Plan: Transfer out of ICU today.  Hopefully will be a candidate for inpatient rehab.   Consultants:  Critical care  Procedures:  Echocardiogram done 12/25: Slightly decreased ejection fraction of 40-45%  Intubated 12/20-12/30  PICC line placed 12/19  Antimicrobials: Actemra 12/17 Remdesivir 12/16 >> 12/20   Vanco 12/25 >> 12/28 Cefepime 12/25 >> 12/27 Ceftriaxone 12/27 >> 12/28 Cefazolin 12/28 >>  1/2  DVT prophylaxis: Lovenox   Objective: Vitals:   02/16/19 1200 02/16/19 1300  BP:    Pulse: (!) 106 (!) 106  Resp: (!) 46 (!) 33  Temp:    SpO2: 96% 98%    Intake/Output Summary (Last 24 hours) at 02/16/2019 1522 Last data filed at 02/16/2019 1300 Gross per 24 hour  Intake 700 ml  Output 800 ml  Net -100 ml   Filed Weights   02/11/19 0500 02/14/19 0500 02/16/19 0441  Weight: (!) 151 kg (!) 151 kg (!) 147 kg   Body mass index is 47.85 kg/m.  Exam:   General: Alert and oriented x2, no immediate distress  HEENT: Normocephalic atraumatic, mucous membranes slightly  dry  Neck: Thick, narrow airway  Cardiovascular: Soft, regular rate and rhythm, S1-S2, mildly tachycardic  Respiratory: Decreased breath sounds throughout secondary to body habitus  Abdomen: Soft, obese, nontender, hypoactive bowel sounds  Musculoskeletal: No clubbing or cyanosis, 1+ pitting edema from the knees down bilaterally  Skin: No skin breaks, tears or lesions  Psychiatry: Appropriate, no evidence of psychoses  Neuro: No focal deficits, but subjective weakness, but nonfocal   Data Reviewed: CBC: Recent Labs  Lab 02/11/19 0518 02/12/19 0400 02/13/19 0300 02/14/19 0437 02/15/19 1503 02/16/19 1121  WBC 12.2* 11.9* 11.7* 9.0  --  6.9  HGB 11.2* 11.7* 11.7* 11.5* 10.5* 11.6*  HCT 40.0 40.4 40.1 39.4 31.0* 38.7  MCV 98.3 94.2 94.8 94.7  --  94.6  PLT 136* 143* 141* 139*  --  412   Basic Metabolic Panel: Recent Labs  Lab 02/11/19 1438 02/12/19 0400 02/13/19 0300 02/14/19 0635 02/15/19 0500 02/15/19 1503 02/16/19 1121  NA 147* 149* 147* 144 144 140 141  K 2.9* 3.3* 3.4* 3.2* 3.1* 2.9* 3.7  CL 98 97* 98 98 100  --  99  CO2 37* 37* 35* 29 34*  --  32  GLUCOSE 153* 141* 208* 198* 159*  --  165*  BUN 34* 35* 29* 23* 20  --  15  CREATININE 0.82 0.85 0.86 0.66 0.59  --  0.66  CALCIUM 8.8* 8.6* 8.3* 8.0* 8.3*  --  8.3*  MG 1.8  --   --  2.1 1.9  --  1.8  PHOS 3.8  --   --   --   --   --   --    GFR: Estimated Creatinine Clearance: 164.3 mL/min (by C-G formula based on SCr of 0.66 mg/dL). Liver Function Tests: Recent Labs  Lab 02/10/19 0954 02/11/19 0518 02/12/19 0400 02/16/19 1121  AST 70* 70* 108* 64*  ALT 52* 49* 52* 36  ALKPHOS 35* 36* 34* 40  BILITOT 0.6 0.6 1.0 1.0  PROT 5.6* 6.2* 6.2* 6.5  ALBUMIN 2.9* 3.1* 3.4* 3.0*   No results for input(s): LIPASE, AMYLASE in the last 168 hours. No results for input(s): AMMONIA in the last 168 hours. Coagulation Profile: No results for input(s): INR, PROTIME in the last 168 hours. Cardiac Enzymes: No  results for input(s): CKTOTAL, CKMB, CKMBINDEX, TROPONINI in the last 168 hours. BNP (last 3 results) No results for input(s): PROBNP in the last 8760 hours. HbA1C: No results for input(s): HGBA1C in the last 72 hours. CBG: Recent Labs  Lab 02/15/19 1947 02/15/19 2331 02/16/19 0435 02/16/19 0741 02/16/19 1125  GLUCAP 130* 83 131* 114* 148*   Lipid Profile: No results for input(s): CHOL, HDL, LDLCALC, TRIG, CHOLHDL, LDLDIRECT in the last 72 hours. Thyroid Function Tests: No results for input(s): TSH, T4TOTAL, FREET4, T3FREE, THYROIDAB in the last 72 hours. Anemia Panel: No results for input(s): VITAMINB12, FOLATE, FERRITIN, TIBC, IRON, RETICCTPCT in the last 72 hours. Urine analysis:    Component Value Date/Time   COLORURINE YELLOW 11/27/2018 2358   APPEARANCEUR CLOUDY (A) 11/27/2018 2358   LABSPEC 1.025 11/27/2018 2358   PHURINE 6.0 11/27/2018 2358   GLUCOSEU NEGATIVE 11/27/2018 2358   HGBUR NEGATIVE  11/27/2018 [place] 11/27/2018 2358   KETONESUR 15 (A) 11/27/2018 2358   PROTEINUR NEGATIVE 11/27/2018 2358   NITRITE NEGATIVE 11/27/2018 2358   LEUKOCYTESUR NEGATIVE 11/27/2018 2358   Sepsis Labs: @LABRCNTIP (procalcitonin:4,lacticidven:4)  ) Recent Results (from the past 240 hour(s))  Culture, respiratory (non-expectorated)     Status: None   Collection Time: 02/06/19  5:30 PM   Specimen: Tracheal Aspirate; Respiratory  Result Value Ref Range Status   Specimen Description   Final    TRACHEAL ASPIRATE Performed at [facility] [address] [place], [place] [address]    Special Requests   Final    NONE Performed at [facility], [place] [address] [place], [place] [address]    Gram Stain   Final    ABUNDANT WBC PRESENT,BOTH PMN AND MONONUCLEAR ABUNDANT GRAM POSITIVE COCCI Performed at [facility], [place] [address], [place], [place] [address]    Culture MODERATE STAPHYLOCOCCUS AUREUS  Final   Report  Status 02/09/2019 FINAL  Final   Organism ID, Bacteria STAPHYLOCOCCUS AUREUS  Final      Susceptibility   Staphylococcus aureus - MIC*    CIPROFLOXACIN <=0.5 SENSITIVE Sensitive     ERYTHROMYCIN <=0.25 SENSITIVE Sensitive     GENTAMICIN <=0.5 SENSITIVE Sensitive     OXACILLIN <=0.25 SENSITIVE Sensitive     TETRACYCLINE <=1 SENSITIVE Sensitive     VANCOMYCIN <=0.5 SENSITIVE Sensitive     TRIMETH/SULFA <=10 SENSITIVE Sensitive     CLINDAMYCIN <=0.25 SENSITIVE Sensitive     RIFAMPIN <=0.5 SENSITIVE Sensitive     Inducible Clindamycin NEGATIVE Sensitive     * MODERATE STAPHYLOCOCCUS AUREUS  Urine culture     Status: None   Collection Time: 02/10/19  3:15 PM   Specimen: Urine, Catheterized  Result Value Ref Range Status   Specimen Description   Final    URINE, CATHETERIZED Performed at [facility], [place] [address], [place], [place] [address]    Special Requests   Final    NONE Performed at [facility], [place] [address], [place], [place] [address]    Culture   Final    NO GROWTH Performed at [facility], [place] [address] [place], [place] [address]    Report Status 02/11/2019 FINAL  Final  Culture, blood (routine x 2)     Status: None   Collection Time: 02/10/19  3:30 PM   Specimen: BLOOD  Result Value Ref Range Status   Specimen Description   Final    BLOOD LEFT HAND Performed at [facility] [address], [place], [place] [address]    Special Requests   Final    BOTTLES DRAWN AEROBIC ONLY Blood Culture adequate volume Performed at [facility] [address] [place], [place] [address]    Culture   Final    NO GROWTH 5 DAYS Performed at [facility], [place] [address], [place], [place] [address]    Report Status 02/15/2019 FINAL  Final  Culture, blood (routine x 2)     Status: None   Collection Time: 02/10/19  3:40 PM   Specimen: BLOOD  Result Value Ref Range Status   Specimen  Description   Final    BLOOD LEFT HAND Performed at [facility] [address] [place],  [address]    Special Requests   Final    BOTTLES DRAWN AEROBIC ONLY Blood Culture adequate volume Performed  at [facility], [place] [address], [place], [place] [address]    Culture   Final    NO GROWTH 5 DAYS Performed at [facility], [place] [address] [place], [place] [address]    Report Status 02/15/2019 FINAL  Final  Culture, respiratory (non-expectorated)     Status: None   Collection Time: 02/10/19  5:34 PM   Specimen: Tracheal Aspirate; Respiratory  Result Value Ref Range Status   Specimen Description   Final    TRACHEAL ASPIRATE Performed at [facility] [address], [place], [place] [address]    Special Requests   Final    NONE Performed at [facility], [place] [address] [place], [place] [address]    Gram Stain   Final    ABUNDANT WBC PRESENT, PREDOMINANTLY PMN RARE GRAM POSITIVE COCCI Performed at [facility], [place] [address] [place], [place] [address]    Culture RARE STAPHYLOCOCCUS AUREUS  Final   Report Status 02/13/2019 FINAL  Final   Organism ID, Bacteria STAPHYLOCOCCUS AUREUS  Final      Susceptibility   Staphylococcus aureus - MIC*    CIPROFLOXACIN <=0.5 SENSITIVE Sensitive     ERYTHROMYCIN <=0.25 SENSITIVE Sensitive     GENTAMICIN <=0.5 SENSITIVE Sensitive     OXACILLIN 0.5 SENSITIVE Sensitive     TETRACYCLINE <=1 SENSITIVE Sensitive     VANCOMYCIN 1 SENSITIVE Sensitive     TRIMETH/SULFA <=10 SENSITIVE Sensitive     CLINDAMYCIN <=0.25 SENSITIVE Sensitive     RIFAMPIN <=0.5 SENSITIVE Sensitive     Inducible Clindamycin NEGATIVE Sensitive     * RARE STAPHYLOCOCCUS AUREUS      Studies: No results found.  Scheduled Meds: . chlorhexidine  15 mL Mouth/Throat BID  . Chlorhexidine Gluconate Cloth  6 each Topical Daily  . enoxaparin (LOVENOX) injection  70 mg  Subcutaneous BID  . [START ON 02/17/2019] famotidine  20 mg Oral Daily  . feeding supplement (ENSURE ENLIVE)  237 mL Oral TID BM  . feeding supplement (PRO-STAT SUGAR FREE 64)  30 mL Oral TID WC  . insulin aspart  0-20 Units Subcutaneous Q4H  . insulin aspart  6 Units Subcutaneous TID WC  . insulin detemir  35 Units Subcutaneous BID  . insulin starter kit- pen needles  1 kit Other Once  . magnesium hydroxide  15 mL Oral Daily  . multivitamin with minerals  1 tablet Oral Daily  . sodium chloride flush  10-40 mL Intracatheter Q12H    Continuous Infusions: . sodium chloride 10 mL/hr at 02/13/19 1800  . sodium chloride 10 mL/hr at 02/13/19 1800     LOS: 21 days     [name], MD [facility]  To reach me or the doctor on call, go to: [email or web address] Password [facility]  02/16/2019, 3:22 PM

## 2019-02-17 LAB — GLUCOSE, CAPILLARY
Glucose-Capillary: 120 mg/dL — ABNORMAL HIGH (ref 70–99)
Glucose-Capillary: 145 mg/dL — ABNORMAL HIGH (ref 70–99)
Glucose-Capillary: 132 mg/dL — ABNORMAL HIGH (ref 70–99)
Glucose-Capillary: 205 mg/dL — ABNORMAL HIGH (ref 70–99)
Glucose-Capillary: 149 mg/dL — ABNORMAL HIGH (ref 70–99)
Glucose-Capillary: 175 mg/dL — ABNORMAL HIGH (ref 70–99)

## 2019-02-17 NOTE — Plan of Care (Signed)
  Problem: Education: Goal: Knowledge of General Education information will improve Description: Including pain rating scale, medication(s)/side effects and non-pharmacologic comfort measures Outcome: Progressing   Problem: Health Behavior/Discharge Planning: Goal: Ability to manage health-related needs will improve Outcome: Progressing   Problem: Clinical Measurements: Goal: Ability to maintain clinical measurements within normal limits will improve Outcome: Progressing Goal: Will remain free from infection Outcome: Progressing Goal: Diagnostic test results will improve Outcome: Progressing Goal: Respiratory complications will improve Outcome: Progressing Goal: Cardiovascular complication will be avoided Outcome: Progressing   Problem: Activity: Goal: Risk for activity intolerance will decrease Outcome: Progressing   Problem: Nutrition: Goal: Adequate nutrition will be maintained Outcome: Progressing   Problem: Coping: Goal: Level of anxiety will decrease Outcome: Progressing   Problem: Elimination: Goal: Will not experience complications related to bowel motility Outcome: Progressing Goal: Will not experience complications related to urinary retention Outcome: Progressing   Problem: Pain Managment: Goal: General experience of comfort will improve Outcome: Progressing   Problem: Safety: Goal: Ability to remain free from injury will improve Outcome: Progressing   Problem: Skin Integrity: Goal: Risk for impaired skin integrity will decrease Outcome: Progressing   Problem: Respiratory: Goal: Will maintain a patent airway Outcome: Progressing Goal: Complications related to the disease process, condition or treatment will be avoided or minimized Outcome: Progressing   Problem: Respiratory: Goal: Ability to maintain a clear airway and adequate ventilation will improve Outcome: Progressing

## 2019-02-17 NOTE — Progress Notes (Signed)
Inpatient Rehab Admissions Coordinator:   At this time we are recommending an inpatient rehab consult.  I will place a consult order so that we can evaluate the patient.   Shann Medal, PT, DPT Admissions Coordinator [phone] 02/17/19  2:02 PM

## 2019-02-17 NOTE — Progress Notes (Signed)
Occupational Therapy Treatment Patient Details Name: Anita Hunter MRN: [id number] DOB: 02-27-1990 Today's Date: 02/17/2019    History of present illness 29 y.o. female with medical history significant of chronic back pain, morbid obesity, eczema, seasonal allergies, sleep apnea, type 2 diabetes admitted with progressively worse shortness of breath. Intubated 12/19-12/30. CXR Worsening patchy opacities throughout both lungs   OT comments  Pt progressing towards established OT goals. Pt continues to present with decreased activity tolerance and fatigues quickly, but is very motivated to participate in therapy. Pt performing sit<>stand (2x with RW and 4x with sara stedy) and transferring to [facility] with sara stedy. Pt requiring Mod-Max A +2 for power up into standing. Pt requiring Max A +2 for peri care and toileting. SpO2 maintaining >88% on 4L via HFNC, RR 38s, and HR 100-125. Cues for rest breaks and purse lip breathing. Continue to recommend dc to CIR for intensive OT and will continue to follow acutely as admitted.    Follow Up Recommendations  CIR;Supervision/Assistance - 24 hour    Equipment Recommendations  3 in 1 bedside commode    Recommendations for Other Services Rehab consult    Precautions / Restrictions Precautions Precautions: Fall Precaution Comments: watch O2, and HR, legs very weak, can stand but not take a step yet Restrictions Weight Bearing Restrictions: No       Mobility Bed Mobility               General bed mobility comments: OOB via sara stedy by RN  Transfers Overall transfer level: Needs assistance Equipment used: Rolling walker (2 wheeled) Transfers: Sit to/from Stand Sit to Stand: Mod assist;Max assist;+2 physical assistance;+2 safety/equipment         General transfer comment: stood x 2 at [facility] with  max assist from low seat, did not feel  legs would support to take a step. sara stedy placed  and patient assisted to stand , using STEDY  bar to  pull up with  2 mod/max assist and bed pad to boost. Patient sat onto flaps to transfer to [facility]. Stood again in [place] stedy to return to [place], decreased control of descent. due to low seat and standing on STEDY platform.    Balance Overall balance assessment: Needs assistance   Sitting balance-Leahy Scale: Fair Sitting balance - Comments: able to  sit self forward in recliner when feet are dependent, performed multiple times. Balances self in midline when sitting in SARA stedy.   Standing balance support: During functional activity;Bilateral upper extremity supported Standing balance-Leahy Scale: Poor Standing balance comment: static stands with UE support, more secure with STEDY knee support than when in RW.                           ADL either performed or assessed with clinical judgement   ADL Overall ADL's : Needs assistance/impaired Eating/Feeding: Minimal assistance;Sitting Eating/Feeding Details (indicate cue type and reason): Pt engaging with self feeding breakfast. Pt able to hold utensils. Min A for opening containers.                      Toilet Transfer: Moderate assistance;Maximal assistance;+2 for physical assistance;+2 for safety/equipment;BSC(sit<>stand with sara stedy) Toilet Transfer Details (indicate cue type and reason): Mod-Max A to power up into standing. Use of stedy to pull into standing and block bilateral knees Toileting- Clothing Manipulation and Hygiene: Maximal assistance;Sit to/from stand;+2 for physical assistance Toileting - Clothing Manipulation Details (indicate  cue type and reason): Max A for peri care with second person for assist to maintain standing.     Functional mobility during ADLs: Maximal assistance;+2 for physical assistance;Moderate assistance(sara stedy to sit<>stand) General ADL Comments: Pt performing sit<>stand with stedy to transfer to [facility] for possible BM. Pt engaging in sit<>stand 6 times.      Vision        Perception     Praxis      Cognition Arousal/Alertness: Awake/alert Behavior During Therapy: WFL for tasks assessed/performed Overall Cognitive Status: Impaired/Different from baseline Area of Impairment: Attention;Memory;Following commands;Safety/judgement;Awareness;Problem solving                   Current Attention Level: Sustained Memory: Decreased short-term memory Following Commands: Follows one step commands consistently;Follows one step commands with increased time Safety/Judgement: Decreased awareness of deficits Awareness: Emergent Problem Solving: Slow processing;Requires verbal cues General Comments: Pt motivated to participate in therapy. Pt continues to present with decreased process and requires increased time for following commands. Pt with decreased attention and becoming distracted by environment and requiring cues to then return to ADL or mobility.         Exercises     Shoulder Instructions       General Comments SpO2 >85% on 4L. RR elevating to 30-40s with activity.     Pertinent Vitals/ Pain       Pain Assessment: Faces Faces Pain Scale: Hurts little more Pain Location: legs, feet and thighs Pain Descriptors / Indicators: Discomfort Pain Intervention(s): Monitored during session;Limited activity within patient's tolerance;Repositioned  Home Living                                          Prior Functioning/Environment              Frequency  Min 3X/week        Progress Toward Goals  OT Goals(current goals can now be found in the care plan section)  Progress towards OT goals: Progressing toward goals  Acute Rehab OT Goals Patient Stated Goal: patient agreed to exercises, get stronger OT Goal Formulation: With patient/family Time For Goal Achievement: 02/28/19 Potential to Achieve Goals: Good ADL Goals Pt Will Perform Eating: with set-up;with supervision;sitting;with adaptive utensils Pt Will Perform  Grooming: with min assist;sitting Pt Will Perform Upper Body Bathing: with min assist;sitting Additional ADL Goal #1: Pt will demonstrate selective attention during ADL task in nondistracting environment Additional ADL Goal #2: Pt will maintain midline postural control EOB x 10 min with +2 mod A in preparation for ADL  Plan Discharge plan remains appropriate    Co-evaluation    PT/OT/SLP Co-Evaluation/Treatment: Yes Reason for Co-Treatment: Complexity of the patient's impairments (multi-system involvement);For patient/therapist safety;To address functional/ADL transfers PT goals addressed during session: Mobility/safety with mobility OT goals addressed during session: ADL's and self-care      AM-PAC OT "6 Clicks" Daily Activity     Outcome Measure   Help from another person eating meals?: A Lot Help from another person taking care of personal grooming?: A Lot Help from another person toileting, which includes using toliet, bedpan, or urinal?: Total Help from another person bathing (including washing, rinsing, drying)?: A Lot Help from another person to put on and taking off regular upper body clothing?: Total Help from another person to put on and taking off regular lower body clothing?: Total 6 Click Score: 9  End of Session Equipment Utilized During Treatment: Oxygen(4L)  OT Visit Diagnosis: Unsteadiness on feet (R26.81);Other abnormalities of gait and mobility (R26.89);Muscle weakness (generalized) (M62.81);Other symptoms and signs involving cognitive function;Pain Pain - part of body: (general discomfort)   Activity Tolerance Patient tolerated treatment well   Patient Left in chair;with call bell/phone within reach(chair position)   Nurse Communication Mobility status        Time: 0816-0920 OT Time Calculation (min): 64 min  Charges: OT General Charges $OT Visit: 1 Visit OT Treatments $Self Care/Home Management : 23-37 mins  [place], OTR/L Acute  Rehab Pager: [phone] Office: [place] 02/17/2019, 4:04 PM

## 2019-02-17 NOTE — Progress Notes (Addendum)
Physical Therapy Treatment Patient Details Name: Anita Hunter MRN: [id number] DOB: Jan 22, 1991 Today's Date: 02/17/2019    History of Present Illness 29 y.o. female with medical history significant of chronic back pain, morbid obesity, eczema, seasonal allergies, sleep apnea, type 2 diabetes admitted with progressively worse shortness of breath. Intubated 12/19-12/30. CXR Worsening patchy opacities throughout both lungs    PT Comments    The patient received in recliner after RN used Denna Haggard to assist patient there. Patient is making excellent progress, awake and following directions. Patient on4 L HFNC, SPO2 > 90% throughout, HR 100-125 with activity, RR up to 38 with exertion. Patient requires rest breaks. The patient is an excellent CIR candidate. Continue progressive activity as tolerated and weaning from HFNC.  Follow Up Recommendations  CIR;Supervision/Assistance - 24 hour     Equipment Recommendations  (tba)    Recommendations for Other Services Rehab consult     Precautions / Restrictions Precautions Precautions: Fall Precaution Comments: watch O2, and HR, legs very weak, can stand but not take a step yet    Mobility  Bed Mobility               General bed mobility comments: OOB via sara stedy by RN  Transfers Overall transfer level: Needs assistance Equipment used: Rolling walker (2 wheeled) Transfers: Sit to/from Stand Sit to Stand: Mod assist;Max assist;+2 physical assistance;+2 safety/equipment         General transfer comment: stood x 1 at RW with  max assist from low seat, diod not feel  legs would support to tak a step. sara stedy placed  and patient assisted to stand , using STEDY  bar to pull up with  2 mod/max assist and bed pad to boost. Patient sat onto flaps to transfer to [facility]. Stood again in [place] stedy to return to [place], decreased control of descent. due to low seat and standing on STEDY platform.  Ambulation/Gait                  Stairs             Wheelchair Mobility    Modified Rankin (Stroke Patients Only)       Balance Overall balance assessment: Needs assistance   Sitting balance-Leahy Scale: Fair Sitting balance - Comments: able to  sit self forward in recliner when feet are dependent, performed multiple times. Balances self in midline when sitting in SARA stedy.   Standing balance support: During functional activity;Bilateral upper extremity supported Standing balance-Leahy Scale: Poor Standing balance comment: static stands with UE support, more secure with STEDY knee support than when in RW.                            Cognition Arousal/Alertness: Awake/alert Behavior During Therapy: WFL for tasks assessed/performed Overall Cognitive Status: Within Functional Limits for tasks assessed                     Current Attention Level: Sustained   Following Commands: Follows one step commands consistently   Awareness: Intellectual Problem Solving: Slow processing General Comments: patient engaged in all aspects of therpay, followed directions. Speach remains very quiet and haorse.      Exercises      General Comments        Pertinent Vitals/Pain Faces Pain Scale: Hurts little more Pain Location: legs, feet and thighs Pain Descriptors / Indicators: Discomfort Pain Intervention(s): Monitored during session  Home Living                      Prior Function            PT Goals (current goals can now be found in the care plan section)      Frequency    Min 3X/week      PT Plan Current plan remains appropriate    Co-evaluation PT/OT/SLP Co-Evaluation/Treatment: Yes Reason for Co-Treatment: Complexity of the patient's impairments (multi-system involvement);For patient/therapist safety PT goals addressed during session: Mobility/safety with mobility OT goals addressed during session: ADL's and self-care      AM-PAC PT "6 Clicks"  Mobility   Outcome Measure  Help needed turning from your back to your side while in a flat bed without using bedrails?: A Lot Help needed moving from lying on your back to sitting on the side of a flat bed without using bedrails?: A Lot Help needed moving to and from a bed to a chair (including a wheelchair)?: A Lot Help needed standing up from a chair using your arms (e.g., wheelchair or bedside chair)?: A Lot Help needed to walk in hospital room?: Total Help needed climbing 3-5 steps with a railing? : Total 6 Click Score: 10    End of Session Equipment Utilized During Treatment: Oxygen Activity Tolerance: Patient tolerated treatment well Patient left: in chair;with call bell/phone within reach Nurse Communication: Mobility status;Need for lift equipment PT Visit Diagnosis: Unsteadiness on feet (R26.81);Difficulty in walking, not elsewhere classified (R26.2)     Time: 7614-7092 PT Time Calculation (min) (ACUTE ONLY): 75 min  Charges:  $Therapeutic Activity: 23-37 mins                     Tresa Endo PT Acute Rehabilitation Services Pager [phone] Office [phone]    Claretha Cooper 02/17/2019, 1:44 PM

## 2019-02-17 NOTE — Progress Notes (Signed)
Rehab Admissions Coordinator Note:  Patient was screened by Michel Santee for appropriateness for an Inpatient Acute Rehab Consult.  Pt is not currently doing enough for me to assess her candidacy.  I will follow from a distance to see how she does with therapy.   Michel Santee 02/17/2019, 11:27 AM  I can be reached at [phone].

## 2019-02-17 NOTE — Progress Notes (Signed)
PROGRESS NOTE  Anita Hunter JWJ:[id number] DOB: 1990-04-28 DOA: 01/26/2019 PCP: [name], MD  HPI/Recap of past 65 hours: 29 year old female with past medical history of morbid obesity, diabetes mellitus and obstructive sleep apnea (who has had issues with leaking mask) admitted on 12/14 with Covid pneumonia and then transferred to the ICU on 12/19 for worsening hypoxia.  She ended up being intubated on 12/20.  On 12/25, elevated white blood cell count and fever and patient had episode of vomiting concerning for aspiration pneumonia.  Also noted to have UTI.  Patient also had an echocardiogram done 12/25 which noted mildly decreased ejection fraction of 40-45%.  Patient started on broad-spectrum antibiotics.  She was able to be extubated on 12/30.  She is continued on high flow oxygen and then BiPAP at night.  Since extubation, she has been seen by speech therapy.  Her voice remains hoarse but audible.  She tolerated ice and sips without aspiration, as well as liquids, but has not been a fan of the limited food options.  Seen by nutrition who recommended liberalizing her diet.  Overall still very weak, seen by Occupational Therapy who recommended inpatient rehab, consult placed.  Patient transferred back from critical care service to the hospital service on 1/3.  Today, patient feeling better.  Oxygen down to 4 L high flow.  Decrease shortness of breath  Assessment/Plan: Principal Problem:   COVID-19 pneumonia with acute on chronic respiratory failure with hypoxia and hypercarbia causing ARDS, course complicated by aspiration pneumonia and chronic underlying obstructive sleep apnea: Status post intubation and on ventilator from 12/20-12/30.  Able to be extubated.  Status post full course of remdesivir, Actemra x1 dose.  BiPAP at night.  Completed course of antibiotics for aspiration pneumonia.  Now working on weaning down her oxygen Active Problems:   Morbid obesity: Patient is criteria  BMI greater than 40.    Type 2 diabetes mellitus (HCC) with hyperglycemia: Adjusting Levemir.  Once her oral intake increases, will need to adjust further.  CBGs have remained stable  AKI: Secondary to acute illness.  Resolved with fluids.    Hypomagnesemia: Replace as needed.  E. coli UTI: Pansensitive.  Completed 5-day course of Ancef.    NASH (nonalcoholic steatohepatitis): Secondary to obesity.  Will need close outpatient follow-up.  Current liver function tests are stable.    Hypertriglyceridemia: Continue meds.  Constipation: Resolved.  Questionable acute on chronic systolic heart failure/cardiomyopathy: Currently euvolemic.  This is due to a viral process or possibly sepsis.  We will plan to recheck echocardiogram when she is in a better respiratory standpoint, when she has been almost fully weaned off of oxygen  Code Status: Full code  Family Communication: Updated mother by phone  Disposition Plan: Waiting for inpatient rehab consult.  Hopefully she will be approved   Consultants:  Critical care  Procedures:  Echocardiogram done 12/25: Slightly decreased ejection fraction of 40-45%  Intubated 12/20-12/30  PICC line placed 12/19  Antimicrobials: Actemra 12/17 Remdesivir 12/16 >> 12/20   Vanco 12/25 >> 12/28 Cefepime 12/25 >> 12/27 Ceftriaxone 12/27 >> 12/28 Cefazolin 12/28 >>  1/2  DVT prophylaxis: Lovenox   Objective: Vitals:   02/17/19 1300 02/17/19 1400  BP: (!) 128/95   Pulse:  (!) 111  Resp: (!) 50 16  Temp:    SpO2:  96%    Intake/Output Summary (Last 24 hours) at 02/17/2019 1508 Last data filed at 02/17/2019 1400 Gross per 24 hour  Intake 720 ml  Output 275  ml  Net 445 ml   Filed Weights   02/11/19 0500 02/14/19 0500 02/16/19 0441  Weight: (!) 151 kg (!) 151 kg (!) 147 kg   Body mass index is 47.85 kg/m.  Exam:   General: Alert and oriented x3, no acute distress  HEENT: Normocephalic atraumatic, mucous membranes slightly  dry  Neck: Thick, narrow airway  Cardiovascular: Soft, regular rate and rhythm, S1-S2, mildly tachycardic  Respiratory: Decreased breath sounds throughout secondary to body habitus  Abdomen: Soft, obese, nontender, hypoactive bowel sounds  Musculoskeletal: No clubbing or cyanosis, 1+ pitting edema from the knees down bilaterally  Skin: No skin breaks, tears or lesions  Psychiatry: Appropriate, no evidence of psychoses  Neuro: No focal deficits   Data Reviewed: CBC: Recent Labs  Lab 02/11/19 0518 02/12/19 0400 02/13/19 0300 02/14/19 0437 02/15/19 1503 02/16/19 1121  WBC 12.2* 11.9* 11.7* 9.0  --  6.9  HGB 11.2* 11.7* 11.7* 11.5* 10.5* 11.6*  HCT 40.0 40.4 40.1 39.4 31.0* 38.7  MCV 98.3 94.2 94.8 94.7  --  94.6  PLT 136* 143* 141* 139*  --  938   Basic Metabolic Panel: Recent Labs  Lab 02/11/19 1438 02/12/19 0400 02/13/19 0300 02/14/19 0635 02/15/19 0500 02/15/19 1503 02/16/19 1121  NA 147* 149* 147* 144 144 140 141  K 2.9* 3.3* 3.4* 3.2* 3.1* 2.9* 3.7  CL 98 97* 98 98 100  --  99  CO2 37* 37* 35* 29 34*  --  32  GLUCOSE 153* 141* 208* 198* 159*  --  165*  BUN 34* 35* 29* 23* 20  --  15  CREATININE 0.82 0.85 0.86 0.66 0.59  --  0.66  CALCIUM 8.8* 8.6* 8.3* 8.0* 8.3*  --  8.3*  MG 1.8  --   --  2.1 1.9  --  1.8  PHOS 3.8  --   --   --   --   --   --    GFR: Estimated Creatinine Clearance: 164.3 mL/min (by C-G formula based on SCr of 0.66 mg/dL). Liver Function Tests: Recent Labs  Lab 02/11/19 0518 02/12/19 0400 02/16/19 1121  AST 70* 108* 64*  ALT 49* 52* 36  ALKPHOS 36* 34* 40  BILITOT 0.6 1.0 1.0  PROT 6.2* 6.2* 6.5  ALBUMIN 3.1* 3.4* 3.0*   No results for input(s): LIPASE, AMYLASE in the last 168 hours. No results for input(s): AMMONIA in the last 168 hours. Coagulation Profile: No results for input(s): INR, PROTIME in the last 168 hours. Cardiac Enzymes: No results for input(s): CKTOTAL, CKMB, CKMBINDEX, TROPONINI in the last 168  hours. BNP (last 3 results) No results for input(s): PROBNP in the last 8760 hours. HbA1C: No results for input(s): HGBA1C in the last 72 hours. CBG: Recent Labs  Lab 02/16/19 2127 02/17/19 0117 02/17/19 0501 02/17/19 0721 02/17/19 1128  GLUCAP 168* 120* 145* 132* 205*   Lipid Profile: No results for input(s): CHOL, HDL, LDLCALC, TRIG, CHOLHDL, LDLDIRECT in the last 72 hours. Thyroid Function Tests: No results for input(s): TSH, T4TOTAL, FREET4, T3FREE, THYROIDAB in the last 72 hours. Anemia Panel: No results for input(s): VITAMINB12, FOLATE, FERRITIN, TIBC, IRON, RETICCTPCT in the last 72 hours. Urine analysis:    Component Value Date/Time   COLORURINE YELLOW 11/27/2018 2358   APPEARANCEUR CLOUDY (A) 11/27/2018 2358   LABSPEC 1.025 11/27/2018 2358   PHURINE 6.0 11/27/2018 [place] 11/27/2018 [place] 11/27/2018 [place] 11/27/2018 2358   KETONESUR  15 (A) 11/27/2018 2358   PROTEINUR NEGATIVE 11/27/2018 2358   NITRITE NEGATIVE 11/27/2018 2358   LEUKOCYTESUR NEGATIVE 11/27/2018 2358   Sepsis Labs: @LABRCNTIP (procalcitonin:4,lacticidven:4)  ) Recent Results (from the past 240 hour(s))  Urine culture     Status: None   Collection Time: 02/10/19  3:15 PM   Specimen: Urine, Catheterized  Result Value Ref Range Status   Specimen Description   Final    URINE, CATHETERIZED Performed at [facility] [address], [place], [place] [address]    Special Requests   Final    NONE Performed at [facility], [place] [address] [place], [place] [address]    Culture   Final    NO GROWTH Performed at [facility], [place] [address] [place], [place] [address]    Report Status 02/11/2019 FINAL  Final  Culture, blood (routine x 2)     Status: None   Collection Time: 02/10/19  3:30 PM   Specimen: BLOOD  Result Value Ref Range Status   Specimen Description   Final    BLOOD LEFT  HAND Performed at [facility] [address], [place], [place] [address]    Special Requests   Final    BOTTLES DRAWN AEROBIC ONLY Blood Culture adequate volume Performed at [facility] [address], [place], [place] [address]    Culture   Final    NO GROWTH 5 DAYS Performed at [facility], [place] [address], [place], [place] [address]    Report Status 02/15/2019 FINAL  Final  Culture, blood (routine x 2)     Status: None   Collection Time: 02/10/19  3:40 PM   Specimen: BLOOD  Result Value Ref Range Status   Specimen Description   Final    BLOOD LEFT HAND Performed at [facility] [address], [place], [place] [address]    Special Requests   Final    BOTTLES DRAWN AEROBIC ONLY Blood Culture adequate volume Performed at [facility] [address] [place], [place] [address]    Culture   Final    NO GROWTH 5 DAYS Performed at [facility], [place] [address], [place], [place] [address]    Report Status 02/15/2019 FINAL  Final  Culture, respiratory (non-expectorated)     Status: None   Collection Time: 02/10/19  5:34 PM   Specimen: Tracheal Aspirate; Respiratory  Result Value Ref Range Status   Specimen Description   Final    TRACHEAL ASPIRATE Performed at [facility] [address], [place], [place] [address]    Special Requests   Final    NONE Performed at [facility], [place] [address], [place], [place] [address]    Gram Stain   Final    ABUNDANT WBC PRESENT, PREDOMINANTLY PMN RARE GRAM POSITIVE COCCI Performed at [facility], [place] [address] [place], [place] [address]    Culture RARE STAPHYLOCOCCUS AUREUS  Final   Report Status 02/13/2019 FINAL  Final   Organism ID, Bacteria STAPHYLOCOCCUS AUREUS  Final      Susceptibility   Staphylococcus aureus - MIC*    CIPROFLOXACIN <=0.5 SENSITIVE Sensitive     ERYTHROMYCIN <=0.25  SENSITIVE Sensitive     GENTAMICIN <=0.5 SENSITIVE Sensitive     OXACILLIN 0.5 SENSITIVE Sensitive     TETRACYCLINE <=1 SENSITIVE Sensitive     VANCOMYCIN 1 SENSITIVE Sensitive  TRIMETH/SULFA <=10 SENSITIVE Sensitive     CLINDAMYCIN <=0.25 SENSITIVE Sensitive     RIFAMPIN <=0.5 SENSITIVE Sensitive     Inducible Clindamycin NEGATIVE Sensitive     * RARE STAPHYLOCOCCUS AUREUS      Studies: No results found.  Scheduled Meds: . chlorhexidine  15 mL Mouth/Throat BID  . Chlorhexidine Gluconate Cloth  6 each Topical Daily  . enoxaparin (LOVENOX) injection  70 mg Subcutaneous BID  . famotidine  20 mg Oral Daily  . feeding supplement (ENSURE ENLIVE)  237 mL Oral TID BM  . feeding supplement (PRO-STAT SUGAR FREE 64)  30 mL Oral TID WC  . insulin aspart  0-20 Units Subcutaneous Q4H  . insulin aspart  6 Units Subcutaneous TID WC  . insulin detemir  35 Units Subcutaneous BID  . insulin starter kit- pen needles  1 kit Other Once  . magnesium hydroxide  15 mL Oral Daily  . multivitamin with minerals  1 tablet Oral Daily  . pantoprazole  40 mg Oral Daily  . sodium chloride flush  10-40 mL Intracatheter Q12H    Continuous Infusions: . sodium chloride 10 mL/hr at 02/13/19 1800  . sodium chloride 10 mL/hr at 02/13/19 1800     LOS: 22 days     [name], MD [facility]  To reach me or the doctor on call, go to: [email or web address] Password [facility]  02/17/2019, 3:08 PM

## 2019-02-17 NOTE — TOC Initial Note (Signed)
Transition of Care [facility]) - Initial/Assessment Note    Patient Details  Name: Anita Hunter MRN: [id number] Date of Birth: 07-17-1990  Transition of Care (TOC) CM/SW Contact:    Joaquin Courts, RN Phone Number: 02/17/2019, 11:52 AM  Clinical Narrative:       CM noted recommendations for CIR. CIR admissions coordinator contacted and asked to review chart to determine if patient is a candidate. Will await CIR determination and coordinate discharge as appropriate.              Expected Discharge Plan: IP Rehab Facility Barriers to Discharge: Continued Medical Work up   Patient Goals and CMS Choice Patient states their goals for this hospitalization and ongoing recovery are:: to get better      Expected Discharge Plan and Services Expected Discharge Plan: [place]   Discharge Planning Services: CM Consult   Living arrangements for the past 2 months: Single Family Home                                      Prior Living Arrangements/Services Living arrangements for the past 2 months: Single Family Home Lives with:: Spouse Patient language and need for interpreter reviewed:: Yes Do you feel safe going back to the place where you live?: Yes      Need for Family Participation in Patient Care: Yes (Comment) Care giver support system in place?: Yes (comment)   Criminal Activity/Legal Involvement Pertinent to Current Situation/Hospitalization: No - Comment as needed  Activities of Daily Living Home Assistive Devices/Equipment: None ADL Screening (condition at time of admission) Patient's cognitive ability adequate to safely complete daily activities?: Yes Is the patient deaf or have difficulty hearing?: No Does the patient have difficulty seeing, even when wearing glasses/contacts?: No Does the patient have difficulty concentrating, remembering, or making decisions?: No Patient able to express need for assistance with ADLs?: Yes Does the patient have  difficulty dressing or bathing?: No Independently performs ADLs?: Yes (appropriate for developmental age) Does the patient have difficulty walking or climbing stairs?: No Weakness of Legs: None Weakness of Arms/Hands: None  Permission Sought/Granted                  Emotional Assessment           Psych Involvement: No (comment)  Admission diagnosis:  Hypoxia [R09.02] Pneumonia due to COVID-19 virus [U07.1, J12.82] COVID-19 [U07.1] Patient Active Problem List   Diagnosis Date Noted  . Aspiration pneumonia ([place]) 02/15/2019  . Acute lower UTI 02/15/2019  . Acute on chronic respiratory failure with hypoxia and hypercapnia ([place]) 02/15/2019  . Acute systolic CHF (congestive heart failure) ([place]) 02/15/2019  . ARDS (adult respiratory distress syndrome) ([place])   . Hypertriglyceridemia   . NASH (nonalcoholic steatohepatitis) 01/27/2019  . COVID-19 01/26/2019  . Type 2 diabetes mellitus ([place]) 01/26/2019  . Hypomagnesemia 01/26/2019  . Acute non intractable tension-type headache 04/16/2018  . Encounter for annual physical exam 04/16/2018  . Chronic left-sided low back pain without sciatica 03/19/2018  . Seasonal allergic rhinitis 03/19/2018  . RLQ abdominal pain 10/24/2016  . Obstructive sleep apnea 08/27/2016  . Back pain 06/27/2016  . Prediabetes 03/02/2015  . Sinusitis, chronic 03/25/2014  . Tobacco use disorder 06/14/2012  . Knee pain, left 06/13/2012  . Morbid obesity ([place]) 04/11/2006  . Eczema 04/11/2006   PCP:  [name], MD Pharmacy:   [id number] [place],  [place] [address] [place] [address] Phone: [phone] Fax: [phone]     Social Determinants of Health (SDOH) Interventions    Readmission Risk Interventions No flowsheet data found.

## 2019-02-18 DIAGNOSIS — J9621 Acute and chronic respiratory failure with hypoxia: Secondary | ICD-10-CM

## 2019-02-18 DIAGNOSIS — J9622 Acute and chronic respiratory failure with hypercapnia: Secondary | ICD-10-CM

## 2019-02-18 LAB — GLUCOSE, CAPILLARY
Glucose-Capillary: 120 mg/dL — ABNORMAL HIGH (ref 70–99)
Glucose-Capillary: 116 mg/dL — ABNORMAL HIGH (ref 70–99)
Glucose-Capillary: 126 mg/dL — ABNORMAL HIGH (ref 70–99)
Glucose-Capillary: 162 mg/dL — ABNORMAL HIGH (ref 70–99)
Glucose-Capillary: 135 mg/dL — ABNORMAL HIGH (ref 70–99)
Glucose-Capillary: 121 mg/dL — ABNORMAL HIGH (ref 70–99)

## 2019-02-18 LAB — C-REACTIVE PROTEIN: CRP: 5.8 mg/dL — ABNORMAL HIGH (ref <1.0)

## 2019-02-18 MED ORDER — GABAPENTIN 100 MG PO CAPS
100.0000 mg | ORAL_CAPSULE | Freq: Two times a day (BID) | ORAL | Status: DC
  Administered 2019-02-18 – 2019-02-19 (×3): 100 mg via ORAL
  Filled 2019-02-18 (×3): qty 1

## 2019-02-18 NOTE — Progress Notes (Signed)
Inpatient Rehabilitation-Admissions Coordinator   I have confirmed DC support with pt's family. Will begin insurance authorization process for possible admit.   Will follow up once there has been an insurance determination regarding CIR.   Raechel Ache, OTR/L  Rehab Admissions Coordinator  [phone] 02/18/2019 4:32 PM

## 2019-02-18 NOTE — Consult Note (Signed)
WOC Nurse Consult Note:  Patient receiving care in [place].  Patient is COVID +.  I spoke with the primary RN, Laney Potash by phone [phone]).  She has agreed to take photos of the wound areas and place in the EMR so that the consult can be continued for treatment of the areas. Val Riles, RN, MSN, CWOCN, CNS-BC, pager [phone]

## 2019-02-18 NOTE — PMR Pre-admission (Signed)
PMR Admission Coordinator Pre-Admission Assessment  Patient: Anita Hunter is an 29 y.o., female MRN: [id number] DOB: 1991-01-28 Height: 5' 9"  (175.3 cm) Weight: (!) 147 kg              Insurance Information HMO:     PPO: yes     PCP:      IPA:      80/20:      OTHER:  PRIMARY: [employer] [place]      Policy#: [id number]      Subscriber: Patient CM Name: Anita Hunter      Phone#: [phone]     Fax#: [phone] Pre-Cert#: [id number]      Employer:  Josem Kaufmann ([id number]) provided by Anita Hunter for admit to CIR with admit date 1/8. Pt is approved for 7 days through 1/14. Clinical updates are due 1/15. (reference call number: [id number]). Clinical updates due to (f): [phone] -need reference number on fax sheet.  Benefits:  Phone #: [phone], options 1 for English, 2, 9, 2     Name: ref # [id number] Eff. Date: 02/13/2019 - 02/12/2020     Deduct: $3,000 ($477.82 met)      Out of Pocket Max: $6,650 (includes - $477.82 met)      Life Max: NA CIR: 75% coverage, 25% co-insurance      SNF: 75% coverage, 25% co-insurance; limited by diagnosis and case review Outpatient: 75% coverage, 25% co-insurance; limited 20 visits combined PT/OT/ST     Home Health: 75% coverage, 25% co-insurance; limited to 100 visits per service year     DME: 75% coverage, 25% co-insurance Providers:  SECONDARY: None      Policy#:       Subscriber:  CM Name:       Phone#:      Fax#:  Pre-Cert#:       Employer:  Benefits:  Phone #:      Name:  Eff. Date:      Deduct:       Out of Pocket Max:       Life Max:  CIR:       SNF:  Outpatient:      Co-Pay:  Home Health:       Co-Pay:  DME:      Co-Pay:   Medicaid Application Date:       Case Manager:  Disability Application Date:       Case Worker:   The "Data Collection Information Summary" for patients in Inpatient Rehabilitation Facilities with attached "Privacy Act [place] Records" was provided and verbally reviewed with:  N/A  Emergency Contact Information Contact Information    Name Relation Home Work Mobile   [place] Mother [phone]     Adair Patter [phone]     [place] Myrtle [phone]     [place], [place] Father   [phone]     Current Medical History  Patient Admitting Diagnosis: Post-Covid-19 Debility  History of Present Illness: Anita Hunter is a 29 year old female with history of chronic back pain, morbid obesity with BMI of 47.85, eczema, sleep apnea, type 2 diabetes mellitus. Pt presented 01/26/2019 with progressive shortness of breath x2 days with sinus congestion, fatigue, malaise, low-grade fever and chills.  She had a history of exposure to positive COVID-19 2 days before the onset of her symptoms.  In the ED SARS coronavirus positive, glucose 257, hemoglobin A1c 11.1, D-dimer 0.53.  Chest x-ray no active disease however follow-up chest x-ray showed development of bilateral heterogeneous lung opacities consistent  with COVID-19 pneumonia.  Patient did require high flow nasal cannula oxygen therapy.  She was placed on prednisone therapy and tapered off as well as antivirals.  Blood culture showing no growth to date.  Patient has been maintained on BiPAP. Bouts of hypokalemia and placed on supplement.  Hospital course complicated by E. coli UTI again antibiotic treatment has been completed.  Patient was treated for acute systolic congestive heart failure maintained on Lasix monitoring hydration.  She is tolerating a regular diet.  Maintain on Lovenox for DVT prophylaxis with venous Doppler studies negative.WOC consult for left ankle wound advised hydrocolloid dressing daily.  Therapy evaluations completed with recommendations of physical medicine rehab consult and patient is to admit to CIR on 02/20/19.     Glasgow Coma Scale Score: 15  Past Medical History  Past Medical History:  Diagnosis Date  . Back pain   . Eczema   . Seasonal allergies   . Sleep  apnea   . Type 2 diabetes mellitus ([place]) 01/26/2019    Family History  family history includes Asthma in her brother; Diabetes in her maternal grandmother; Fibroids in an other family member; Hypertension in her father, maternal grandmother, and paternal grandmother; Migraines in her father.  Prior Rehab/Hospitalizations:  Has the patient had prior rehab or hospitalizations prior to admission? No  Has the patient had major surgery during 100 days prior to admission? No  Current Medications   Current Facility-Administered Medications:  .  0.9 %  sodium chloride infusion, , Intravenous, PRN, [name], MD, Last Rate: 10 mL/hr at 02/13/19 1800, Rate Verify at 02/13/19 1800 .  0.9 %  sodium chloride infusion, , Intravenous, PRN, [name], MD, Last Rate: 10 mL/hr at 02/13/19 1800, Rate Verify at 02/13/19 1800 .  [DISCONTINUED] acetaminophen (TYLENOL) tablet 650 mg, 650 mg, Oral, Q4H PRN, 650 mg at 01/31/19 0800 **OR** acetaminophen (TYLENOL) suppository 650 mg, 650 mg, Rectal, Q4H PRN, [name], MD, 650 mg at 02/06/19 0850 .  acetaminophen (TYLENOL) tablet 650 mg, 650 mg, Oral, Q6H PRN, [name], MD, 650 mg at 02/20/19 0021 .  chlorhexidine (PERIDEX) 0.12 % solution 15 mL, 15 mL, Mouth/Throat, BID, [name], MD, 15 mL at 02/19/19 2136 .  Chlorhexidine Gluconate Cloth 2 % PADS 6 each, 6 each, Topical, Daily, [name], MD, 6 each at 02/20/19 [phone] .  docusate (COLACE) 50 MG/5ML liquid 100 mg, 100 mg, Oral, BID PRN, [name], MD, 100 mg at 02/12/19 2123 .  enoxaparin (LOVENOX) injection 70 mg, 70 mg, Subcutaneous, Q24H, Joselyn Glassman A, RPH, 70 mg at 02/20/19 0948 .  famotidine (PEPCID) tablet 20 mg, 20 mg, Oral, Daily, [name], MD, 20 mg at 02/20/19 0948 .  feeding supplement (ENSURE ENLIVE) (ENSURE ENLIVE) liquid 237 mL, 237 mL, Oral, TID BM, [name], MD, 237 mL at 02/19/19 2135 .  feeding supplement  (PRO-STAT SUGAR FREE 64) liquid 30 mL, 30 mL, Oral, TID WC, [name], MD, 30 mL at 02/19/19 0845 .  furosemide (LASIX) tablet 40 mg, 40 mg, Oral, Daily, Barb Merino, MD, 40 mg at 02/20/19 0951 .  gabapentin (NEURONTIN) capsule 200 mg, 200 mg, Oral, TID, Barb Merino, MD, 200 mg at 02/20/19 0948 .  insulin aspart (novoLOG) injection 0-20 Units, 0-20 Units, Subcutaneous, Q4H, [name], MD, 3 Units at 02/20/19 0950 .  insulin aspart (novoLOG) injection 6 Units, 6 Units, Subcutaneous, TID WC, [name], MD, 6 Units  at 02/19/19 1200 .  insulin detemir (LEVEMIR) injection 35 Units, 35 Units, Subcutaneous, BID, [name], MD, 35 Units at 02/20/19 [phone] .  insulin starter kit- pen needles (English) 1 kit, 1 kit, Other, Once, [place], Trenton Gammon, MD .  Ipratropium-Albuterol (COMBIVENT) respimat 2 puff, 2 puff, Inhalation, Q6H PRN, [name], MD .  LORazepam (ATIVAN) injection 1 mg, 1 mg, Intravenous, Q4H PRN, [name], MD, 1 mg at 02/13/19 2256 .  magnesium hydroxide (MILK OF MAGNESIA) suspension 15 mL, 15 mL, Oral, Daily, [name], MD, 15 mL at 02/19/19 0845 .  magnesium oxide (MAG-OX) tablet 400 mg, 400 mg, Oral, BID, Barb Merino, MD, 400 mg at 02/20/19 0951 .  multivitamin with minerals tablet 1 tablet, 1 tablet, Oral, Daily, [name], MD, 1 tablet at 02/20/19 0949 .  pantoprazole (PROTONIX) EC tablet 40 mg, 40 mg, Oral, Daily, [name], MD, 40 mg at 02/20/19 0951 .  potassium chloride SA (KLOR-CON) CR tablet 40 mEq, 40 mEq, Oral, BID, Barb Merino, MD, 40 mEq at 02/20/19 0949 .  sodium chloride flush (NS) 0.9 % injection 10-40 mL, 10-40 mL, Intracatheter, Q12H, [name], MD, 10 mL at 02/19/19 0903 .  sodium chloride flush (NS) 0.9 % injection 10-40 mL, 10-40 mL, Intracatheter, PRN, [name], MD  Patients Current Diet:  Diet Order            Diet Carb Modified        Diet regular Room  service appropriate? Yes; Fluid consistency: Thin  Diet effective now              Precautions / Restrictions Precautions Precautions: Fall Precaution Comments: watch O2, and HR, legs very weak, can stand but not take a step yet Restrictions Weight Bearing Restrictions: No   Has the patient had 2 or more falls or a fall with injury in the past year?No  Prior Activity Level Community (5-7x/wk): worked at [employer] No AD, Independent prior. Lived with friend and cousin in apartment at baseline.   Prior Functional Level Prior Function Level of Independence: Independent Comments: Working at [employer];   Gross: Did the patient need help bathing, dressing, using the toilet or eating?  Independent  Indoor Mobility: Did the patient need assistance with walking from room to room (with or without device)? Independent  Stairs: Did the patient need assistance with internal or external stairs (with or without device)? Independent  Functional Cognition: Did the patient need help planning regular tasks such as shopping or remembering to take medications? Independent  Home Assistive Devices / Equipment Home Assistive Devices/Equipment: None Home Equipment: None  Prior Device Use: Indicate devices/aids used by the patient prior to current illness, exacerbation or injury? None of the above  Current Functional Level Cognition  Overall Cognitive Status: No family/caregiver present to determine baseline cognitive functioning Current Attention Level: Sustained Orientation Level: Oriented X4 Following Commands: Follows one step commands consistently, Follows one step commands with increased time Safety/Judgement: Decreased awareness of deficits General Comments: initially not very talkertive but became more as tx progressed, seems to be getting better cognitively    Extremity Assessment (includes Sensation/Coordination)  Upper Extremity Assessment: Generalized weakness RUE Deficits  / Details: generalized weakness; AROM grossly WFL with 3/5 strength throughtout but difficulty coordinating movement and bringing hand to mouth - some difficulty with this due to bed positioning and body habitus; difficulty with inhand manipulation skills making managing utneils, toothbrush, etc more difficult  RUE Coordination: decreased fine motor, decreased gross motor LUE Deficits / Details: overall weaker than R; Increased weakness L shoulder; only @ 0-30 degrees active shoulder flexion; unable to place and hold shoulder @ 90, however also difficulty sustaining attention to task; increased difficulty wiht fine motor and in-hand manipulation skills; unable to bring hand to mouth due to weakness LUE Coordination: decreased fine motor, decreased gross motor  Lower Extremity Assessment: Defer to PT evaluation RLE Deficits / Details: 1/5 hip flexion, 1+/5 knee extension, abd. reports foot feels numb, appreciates LT, did not fully test due to MS, dorsiflex is 1 LLE Deficits / Details: hip flex 2/5. knee ext 2/5.  Abd 2/5, relates some numbness but not as much as right, dorsiflex/plantar is 2+    ADLs  Overall ADL's : Needs assistance/impaired Eating/Feeding: Minimal assistance, Sitting Eating/Feeding Details (indicate cue type and reason): Pt engaging with self feeding breakfast. Pt able to hold utensils. Min A for opening containers.  Grooming: Moderate assistance, Wash/dry face, Oral care Grooming Details (indicate cue type and reason): Able to bring toothbrush to mouth after set up but difficulty sustaining attention to task; left toothbrush handing in mouth and required tactile cues to complete; assist for sequencing Upper Body Bathing: Maximal assistance, Bed level Lower Body Bathing: Total assistance, Bed level Upper Body Dressing : Total assistance, Bed level Lower Body Dressing: Total assistance, Bed level Toilet Transfer: Moderate assistance, Maximal assistance, +2 for physical assistance,  +2 for safety/equipment, BSC(sit<>stand with sara stedy) Toilet Transfer Details (indicate cue type and reason): Mod-Max A to power up into standing. Use of stedy to pull into standing and block bilateral knees Toileting- Clothing Manipulation and Hygiene: Maximal assistance, Sit to/from stand, +2 for physical assistance Toileting - Clothing Manipulation Details (indicate cue type and reason): Max A for peri care with second person for assist to maintain standing. Functional mobility during ADLs: Maximal assistance, +2 for physical assistance, Moderate assistance(sara stedy to sit<>stand) General ADL Comments: Pt performing sit<>stand with stedy to transfer to [facility] for possible BM. Pt engaging in sit<>stand 6 times.     Mobility  Overal bed mobility: Needs Assistance Bed Mobility: Rolling, Supine to Sit Rolling: Min assist Supine to sit: Mod assist General bed mobility comments: OOB via sara stedy by RN    Transfers  Overall transfer level: Needs assistance Equipment used: Rolling walker (2 wheeled) Transfer via Lift Equipment: Stedy Transfers: Sit to/from Stand, [employer] Transfers Sit to Stand: Mod assist Stand pivot transfers: Mod assist, +2 physical assistance General transfer comment: completed sit<>stand x 5 at EOB w/ cues and RW, then able to take some small pivotal steps to recliner    Ambulation / Gait / Stairs / Wheelchair Mobility  Ambulation/Gait General Gait Details: will attempt at next session    Posture / Balance Dynamic Sitting Balance Sitting balance - Comments: sits eob w/o LOB Balance Overall balance assessment: Needs assistance Sitting-balance support: Feet supported, Bilateral upper extremity supported Sitting balance-Leahy Scale: Fair Sitting balance - Comments: sits eob w/o LOB Postural control: Left lateral lean Standing balance support: During functional activity, Bilateral upper extremity supported Standing balance-Leahy Scale: Fair Standing balance  comment: stands w/ RW and maintains with min guard to min a    Special needs/care consideration BiPAP/CPAP: pt has CPAP at home but doesn't use if frequently CPM: no Continuous Drip IV: no Dialysis: no        Days: no Life Vest: no Oxygen: 3L  Special Bed: no Trach Size: no Wound Vac (  area): no      Location: no Skin: left posterior back dehisced location, MASD medial, right, left sacrum, skin tear to right, lateral leg.                   Bowel mgmt: last BM 02/20/19, continent Bladder mgmt: continent Diabetic mgmt: yes Behavioral consideration : no Chemo/radiation : no     Previous Home Environment (from acute therapy documentation) Living Arrangements: Spouse/significant other Available Help at Discharge: Family, Available 24 hours/day Type of Home: Apartment Home Layout: One level Home Access: Stairs to enter [employer] of Steps: 14 Bathroom Shower/Tub: Public librarian, Architectural technologist: Programmer, systems: Yes How Accessible: Accessible via walker [place]: No  Discharge Living Setting Plans for Discharge Living Setting: Other (Comment)(plan is to go to pt's parents apartment at DC) Type of Home at Discharge: Apartment Discharge Home Layout: One level Discharge Home Access: Stairs to enter Entrance Stairs-Rails: Right Entrance Stairs-Number of Steps: 6-8 (as apartment is on the 2nd level) Discharge Bathroom Shower/Tub: Tub/shower unit Discharge Bathroom Toilet: Standard Discharge Bathroom Accessibility: Yes How Accessible: Accessible via walker Does the patient have any problems obtaining your medications?: No  Social/Family/Support Systems Patient Roles: Other (Comment)(employee at [employer]. Lives with cousin and friend) Sport and exercise psychologist Information: Ambika Zettlemoyer (mother): [phone]; Delfin Edis (sister): [phone] Anticipated Caregiver: parents, has supportive sister Delfin Edis) Anticipated Caregiver's Contact Information: see  above Ability/Limitations of Caregiver: Min A Caregiver Availability: 24/7 (mother and father will take turns staying with her).  Discharge Plan Discussed with Primary Caregiver: Yes Is Caregiver In Agreement with Plan?: Yes Does Caregiver/Family have Issues with Lodging/Transportation while Pt is in Rehab?: No   Goals/Additional Needs Patient/Family Goal for Rehab: PT/OT: Supervision/Min A; SLP: NA Expected length of stay: 21-25 days  Cultural Considerations: NA Dietary Needs: regular diet, thin liquids Equipment Needs: Charlaine [place], [place] possibly Pt/Family Agrees to Admission and willing to participate: Yes Program Orientation Provided & Reviewed with Pt/Caregiver Including Roles  & Responsibilities: Yes(pt, her sister, and her mom)  Barriers to Discharge: Medical stability, Home environment access/layout, Insurance for SNF coverage, New oxygen  Barriers to Discharge Comments: steps to enter, new O2 needs.    Decrease burden of Care through IP rehab admission: NA   Possible need for SNF placement upon discharge: Not anticipated. Pt has great family support at DC with someone who is able to provide physical assist 24/7. Pt will need to negotiate stairs to enter her parents apartment but anticipate that is within reach after CIR stay. Pt is highly motivated and understands the plan is to DC home from IP Rehab stay.    Patient Condition: This patient's medical and functional status has changed since the consult dated: 02/19/19 in which the Rehabilitation Physician determined and documented that the patient's condition is appropriate for intensive rehabilitative care in an inpatient rehabilitation facility. Medical changes are: pt with peripheral neuropathy with pt started on gabapentin; see HPI for details.  Functional changes [id number] in functional transfers from Mod A + 2, Max A + 2 for sit to stand and use of stedy to Mod A for sit to stand with RW and Mod A +2 for stand pivot.  . After  evaluating the patient today and speaking with the Rehabilitation physician and acute team, the patient remains appropriate for inpatient rehab. Will admit to inpatient rehab 02/20/19.  Preadmission Screen Completed By:  Raechel Ache, OT, 02/20/2019 11:27 AM ______________________________________________________________________   Discussed status with Dr. Ranell Patrick on 1/8/21at 11:27AM and received  approval for admission today.  Admission Coordinator:  Raechel Ache, time 11:27AM/Date 02/20/19

## 2019-02-18 NOTE — Progress Notes (Signed)
PROGRESS NOTE    Anita Hunter  AST:[id number] DOB: 1990-05-29 DOA: 01/26/2019 PCP: [name], MD    Brief Narrative:  29 year old lady with history of diabetes, morbid obesity, obstructive sleep apnea admitted on 12/14 with Covid pneumonia, transferred to ICU on 12/19 for worsening hypoxia.  Intubated with mechanical ventilation on 12/20-12/30.  Extubated on 12/30.  Treated for UTI and aspiration pneumonia.  Oxygen requirement improving.   Assessment & Plan:   Principal Problem:   Acute on chronic respiratory failure with hypoxia and hypercapnia (HCC) Active Problems:   Morbid obesity (HCC)   Obstructive sleep apnea   COVID-19   Type 2 diabetes mellitus (HCC)   Hypomagnesemia   NASH (nonalcoholic steatohepatitis)   Hypertriglyceridemia   ARDS (adult respiratory distress syndrome) (HCC)   Aspiration pneumonia (HCC)   Acute lower UTI   Acute systolic CHF (congestive heart failure) ([place])  COVID-19 pneumonia with acute on chronic respiratory failure with hypoxia and hypercarbia, ARDS complicated by aspiration pneumonia and underlying obstructive sleep apnea: Clinically improving.  Oxygen as needed to keep saturation more than 89%. Can use BiPAP at night. Finished all antibiotics, steroids and antiviral therapies.  Type 2 diabetes with hyperglycemia: On Levemir.  Acute kidney injury: Improved.  E. coli UTI: Treated and completed course.  Deconditioning: Patient is reportedly physical deconditioning.  She will benefit with aggressive inpatient therapies.  Waiting for inpatient rehab availability.   DVT prophylaxis: Lovenox subcu Code Status: Full code Family Communication: None Disposition Plan: CIR when bed available   Consultants:   PCCM  Procedures:   None  Antimicrobials:  Anti-infectives (From admission, onward)   Start     Dose/Rate Route Frequency Ordered Stop   02/09/19 1400  ceFAZolin (ANCEF) IVPB 2g/100 mL premix     2 g 200 mL/hr over 30  Minutes Intravenous Every 8 hours 02/09/19 1248 02/14/19 2359   02/08/19 1600  vancomycin (VANCOCIN) IVPB 1000 mg/200 mL premix  Status:  Discontinued     1,000 mg 200 mL/hr over 60 Minutes Intravenous Every 8 hours 02/08/19 1334 02/09/19 1247   02/08/19 1300  cefTRIAXone (ROCEPHIN) 2 g in sodium chloride 0.9 % 100 mL IVPB  Status:  Discontinued     2 g 200 mL/hr over 30 Minutes Intravenous Every 24 hours 02/08/19 1216 02/09/19 1247   02/06/19 1800  vancomycin (VANCOREADY) IVPB 1750 mg/350 mL  Status:  Discontinued     1,750 mg 175 mL/hr over 120 Minutes Intravenous Every 12 hours 02/06/19 0500 02/08/19 1334   02/06/19 0515  vancomycin (VANCOCIN) 2,500 mg in sodium chloride 0.9 % 500 mL IVPB     2,500 mg 250 mL/hr over 120 Minutes Intravenous NOW 02/06/19 0450 02/06/19 0800   02/06/19 0500  ceFEPIme (MAXIPIME) 2 g in sodium chloride 0.9 % 100 mL IVPB  Status:  Discontinued     2 g 200 mL/hr over 30 Minutes Intravenous Every 8 hours 02/06/19 0450 02/08/19 1216   01/29/19 1000  remdesivir 100 mg in sodium chloride 0.9 % 100 mL IVPB     100 mg 200 mL/hr over 30 Minutes Intravenous Daily 01/28/19 0732 02/01/19 1123   01/28/19 1000  remdesivir 200 mg in sodium chloride 0.9% 250 mL IVPB     200 mg 580 mL/hr over 30 Minutes Intravenous Once 01/28/19 0732 01/28/19 1005         Subjective: Patient seen and examined.  No overnight events.  Sitting in chair on room air.  Complains of tingling sensation on the  legs.  Objective: Vitals:   02/18/19 0100 02/18/19 0400 02/18/19 0817 02/18/19 1153  BP: 131/67 (!) 104/52 134/86 124/75  Pulse: (!) 108 (!) 106  (!) 109  Resp: (!) 21 20  (!) 21  Temp: 99.1 F (37.3 C)   99.1 F (37.3 C)  TempSrc: Oral  Oral Oral  SpO2: 94% 95%  91%  Weight:      Height:        Intake/Output Summary (Last 24 hours) at 02/18/2019 1446 Last data filed at 02/17/2019 2030 Gross per 24 hour  Intake 480 ml  Output --  Net 480 ml   Filed Weights   02/11/19 0500  02/14/19 0500 02/16/19 0441  Weight: (!) 151 kg (!) 151 kg (!) 147 kg    Examination:  General exam: Appears calm and comfortable, without any distress. Respiratory system: Clear to auscultation. Respiratory effort normal.  No added sounds. Cardiovascular system: S1 & S2 heard, RRR. No JVD, murmurs, rubs, gallops or clicks.  Trace bilateral pedal edema mostly on the dorsum of the foot. Gastrointestinal system: Abdomen is nondistended, soft and nontender. No organomegaly or masses felt. Normal bowel sounds heard.  Obese and pendulous. Central nervous system: Alert and oriented. No focal neurological deficits. Extremities: Symmetric 5 x 5 power. Skin: No rashes, lesions or ulcers Psychiatry: Judgement and insight appear normal. Mood & affect appropriate.     Data Reviewed: I have personally reviewed following labs and imaging studies  CBC: Recent Labs  Lab 02/12/19 0400 02/13/19 0300 02/14/19 0437 02/15/19 1503 02/16/19 1121  WBC 11.9* 11.7* 9.0  --  6.9  HGB 11.7* 11.7* 11.5* 10.5* 11.6*  HCT 40.4 40.1 39.4 31.0* 38.7  MCV 94.2 94.8 94.7  --  94.6  PLT 143* 141* 139*  --  161   Basic Metabolic Panel: Recent Labs  Lab 02/12/19 0400 02/13/19 0300 02/14/19 0635 02/15/19 0500 02/15/19 1503 02/16/19 1121  NA 149* 147* 144 144 140 141  K 3.3* 3.4* 3.2* 3.1* 2.9* 3.7  CL 97* 98 98 100  --  99  CO2 37* 35* 29 34*  --  32  GLUCOSE 141* 208* 198* 159*  --  165*  BUN 35* 29* 23* 20  --  15  CREATININE 0.85 0.86 0.66 0.59  --  0.66  CALCIUM 8.6* 8.3* 8.0* 8.3*  --  8.3*  MG  --   --  2.1 1.9  --  1.8   GFR: Estimated Creatinine Clearance: 164.3 mL/min (by C-G formula based on SCr of 0.66 mg/dL). Liver Function Tests: Recent Labs  Lab 02/12/19 0400 02/16/19 1121  AST 108* 64*  ALT 52* 36  ALKPHOS 34* 40  BILITOT 1.0 1.0  PROT 6.2* 6.5  ALBUMIN 3.4* 3.0*   No results for input(s): LIPASE, AMYLASE in the last 168 hours. No results for input(s): AMMONIA in the last  168 hours. Coagulation Profile: No results for input(s): INR, PROTIME in the last 168 hours. Cardiac Enzymes: No results for input(s): CKTOTAL, CKMB, CKMBINDEX, TROPONINI in the last 168 hours. BNP (last 3 results) No results for input(s): PROBNP in the last 8760 hours. HbA1C: No results for input(s): HGBA1C in the last 72 hours. CBG: Recent Labs  Lab 02/17/19 1929 02/18/19 0048 02/18/19 0434 02/18/19 0812 02/18/19 1147  GLUCAP 175* 120* 116* 126* 162*   Lipid Profile: No results for input(s): CHOL, HDL, LDLCALC, TRIG, CHOLHDL, LDLDIRECT in the last 72 hours. Thyroid Function Tests: No results for input(s): TSH, T4TOTAL, FREET4, T3FREE, THYROIDAB  in the last 72 hours. Anemia Panel: No results for input(s): VITAMINB12, FOLATE, FERRITIN, TIBC, IRON, RETICCTPCT in the last 72 hours. Sepsis Labs: Recent Labs  Lab 02/15/19 1600  PROCALCITON 0.10    Recent Results (from the past 240 hour(s))  Urine culture     Status: None   Collection Time: 02/10/19  3:15 PM   Specimen: Urine, Catheterized  Result Value Ref Range Status   Specimen Description   Final    URINE, CATHETERIZED Performed at [facility] [address], [place], [place] [address]    Special Requests   Final    NONE Performed at [facility], [place] [address], [place], [place] [address]    Culture   Final    NO GROWTH Performed at [facility], [place] [address] [place], [place] [address]    Report Status 02/11/2019 FINAL  Final  Culture, blood (routine x 2)     Status: None   Collection Time: 02/10/19  3:30 PM   Specimen: BLOOD  Result Value Ref Range Status   Specimen Description   Final    BLOOD LEFT HAND Performed at [facility] [address] [place], [place] [address]    Special Requests   Final    BOTTLES DRAWN AEROBIC ONLY Blood Culture adequate volume Performed at [facility] [address] [place],  [place] [address]    Culture   Final    NO GROWTH 5 DAYS Performed at [facility], [place] [address], [place], [place] [address]    Report Status 02/15/2019 FINAL  Final  Culture, blood (routine x 2)     Status: None   Collection Time: 02/10/19  3:40 PM   Specimen: BLOOD  Result Value Ref Range Status   Specimen Description   Final    BLOOD LEFT HAND Performed at [facility] [address], [place], [place] [address]    Special Requests   Final    BOTTLES DRAWN AEROBIC ONLY Blood Culture adequate volume Performed at [facility] [address], [place], [place] [address]    Culture   Final    NO GROWTH 5 DAYS Performed at [facility], [place] [address], [place], [place] [address]    Report Status 02/15/2019 FINAL  Final  Culture, respiratory (non-expectorated)     Status: None   Collection Time: 02/10/19  5:34 PM   Specimen: Tracheal Aspirate; Respiratory  Result Value Ref Range Status   Specimen Description   Final    TRACHEAL ASPIRATE Performed at [facility] [address] [place], [place] [address]    Special Requests   Final    NONE Performed at [facility], [place] [address] [place],  [address]    Gram Stain   Final    ABUNDANT WBC PRESENT, PREDOMINANTLY PMN RARE GRAM POSITIVE COCCI Performed at [facility], [place] [address] [place],  [address]    Culture RARE STAPHYLOCOCCUS AUREUS  Final   Report Status 02/13/2019 FINAL  Final   Organism ID, Bacteria STAPHYLOCOCCUS AUREUS  Final      Susceptibility   Staphylococcus aureus - MIC*    CIPROFLOXACIN <=0.5 SENSITIVE Sensitive     ERYTHROMYCIN <=0.25 SENSITIVE Sensitive     GENTAMICIN <=0.5 SENSITIVE Sensitive     OXACILLIN 0.5 SENSITIVE Sensitive     TETRACYCLINE <=1 SENSITIVE Sensitive     VANCOMYCIN 1  SENSITIVE Sensitive     TRIMETH/SULFA <=10 SENSITIVE Sensitive     CLINDAMYCIN <=0.25 SENSITIVE Sensitive      RIFAMPIN <=0.5 SENSITIVE Sensitive     Inducible Clindamycin NEGATIVE Sensitive     * RARE STAPHYLOCOCCUS AUREUS         Radiology Studies: No results found.      Scheduled Meds: . chlorhexidine  15 mL Mouth/Throat BID  . Chlorhexidine Gluconate Cloth  6 each Topical Daily  . enoxaparin (LOVENOX) injection  70 mg Subcutaneous BID  . famotidine  20 mg Oral Daily  . feeding supplement (ENSURE ENLIVE)  237 mL Oral TID BM  . feeding supplement (PRO-STAT SUGAR FREE 64)  30 mL Oral TID WC  . insulin aspart  0-20 Units Subcutaneous Q4H  . insulin aspart  6 Units Subcutaneous TID WC  . insulin detemir  35 Units Subcutaneous BID  . insulin starter kit- pen needles  1 kit Other Once  . magnesium hydroxide  15 mL Oral Daily  . multivitamin with minerals  1 tablet Oral Daily  . pantoprazole  40 mg Oral Daily  . sodium chloride flush  10-40 mL Intracatheter Q12H   Continuous Infusions: . sodium chloride 10 mL/hr at 02/13/19 1800  . sodium chloride 10 mL/hr at 02/13/19 1800     LOS: 23 days    Time spent: 25 minutes    Barb Merino, MD [facility] Pager [phone]

## 2019-02-18 NOTE — Progress Notes (Signed)
CSW received a call from patient's sister, Phineas Real, indicating that they are trying to assist with paying the patient's bills while she's hospitalized as well as get assistance with her COVID diagnosis. Latoya asked for a letter documenting that the patient was hospitalized with COVID. CSW compiled letter and emailed it to the address that [place] provided.  Laveda Abbe, [place] Clinical Social Worker [phone]

## 2019-02-18 NOTE — Plan of Care (Signed)
  Problem: Education: Goal: Knowledge of General Education information will improve Description: Including pain rating scale, medication(s)/side effects and non-pharmacologic comfort measures Outcome: Progressing   Problem: Health Behavior/Discharge Planning: Goal: Ability to manage health-related needs will improve Outcome: Progressing   Problem: Clinical Measurements: Goal: Ability to maintain clinical measurements within normal limits will improve Outcome: Progressing Goal: Will remain free from infection Outcome: Progressing Goal: Diagnostic test results will improve Outcome: Progressing Goal: Respiratory complications will improve Outcome: Progressing Goal: Cardiovascular complication will be avoided Outcome: Progressing   Problem: Activity: Goal: Risk for activity intolerance will decrease Outcome: Progressing   Problem: Nutrition: Goal: Adequate nutrition will be maintained Outcome: Progressing   Problem: Coping: Goal: Level of anxiety will decrease Outcome: Progressing   Problem: Elimination: Goal: Will not experience complications related to bowel motility Outcome: Progressing Goal: Will not experience complications related to urinary retention Outcome: Progressing   Problem: Pain Managment: Goal: General experience of comfort will improve Outcome: Progressing   Problem: Safety: Goal: Ability to remain free from injury will improve Outcome: Progressing   Problem: Skin Integrity: Goal: Risk for impaired skin integrity will decrease Outcome: Progressing   Problem: Respiratory: Goal: Will maintain a patent airway Outcome: Progressing Goal: Complications related to the disease process, condition or treatment will be avoided or minimized Outcome: Progressing   Problem: Respiratory: Goal: Ability to maintain a clear airway and adequate ventilation will improve Outcome: Progressing

## 2019-02-18 NOTE — Progress Notes (Signed)
Physical Therapy Treatment Patient Details Name: Anita Hunter MRN: [id number] DOB: 1990/03/09 Today's Date: 02/18/2019    History of Present Illness 29 y.o. female with medical history significant of chronic back pain, morbid obesity, eczema, seasonal allergies, sleep apnea, type 2 diabetes admitted with progressively worse shortness of breath. Intubated 12/19-12/30. CXR Worsening patchy opacities throughout both lungs    PT Comments    Pt tolerated tx well this pm, pt was found sitting in recliner and was agreeable to tx. Pt completed sit<>stand/ pull to stand with stedy multi times and was able to maintain stance approx 1-56mns with each. Pt does fatigue quickly but with cues and encouragement able to try a bit more. Bed mobility still with mod/max a and bed fixtures. Pt was on 1L/min via [place] and was able to maintain sats in low 90s throughout most of tx. At times monitor registered desat but quickly returned to 90s, most likely misreading.   Follow Up Recommendations  CIR;Supervision/Assistance - 24 hour     Equipment Recommendations  None recommended by PT(so far)    Recommendations for Other Services Rehab consult     Precautions / Restrictions Precautions Precautions: Fall Restrictions Weight Bearing Restrictions: No    Mobility  Bed Mobility Overal bed mobility: Needs Assistance Bed Mobility: Rolling Rolling: Max assist         General bed mobility comments: OOB via sara stedy by RN  Transfers Overall transfer level: Needs assistance   Transfers: Sit to/from Stand;Stand Pivot Transfers Sit to Stand: Mod assist;+2 physical assistance Stand pivot transfers: Mod assist;+2 physical assistance       General transfer comment: able to stand from recliner to stedy x 3, stands max 1-242ms then fatigues needeing seated rest break, states RW sacres her  Ambulation/Gait             General Gait Details: not able to ambulate as yet   Stairs              Wheelchair Mobility    Modified Rankin (Stroke Patients Only)       Balance Overall balance assessment: Needs assistance Sitting-balance support: Feet supported Sitting balance-Leahy Scale: Fair Sitting balance - Comments: able sit EOB unsupported and not lose balance   Standing balance support: Bilateral upper extremity supported;During functional activity Standing balance-Leahy Scale: Fair Standing balance comment: static stands with UE support                            Cognition Arousal/Alertness: Awake/alert Behavior During Therapy: WFL for tasks assessed/performed Overall Cognitive Status: No family/caregiver present to determine baseline cognitive functioning                                 General Comments: initially not very talkertive but became more as tx progressed, seems to be getting better cognitively      Exercises      General Comments        Pertinent Vitals/Pain Pain Assessment: Faces Faces Pain Scale: Hurts even more Pain Location: seems to have some pain with mobility, also has multi open spots on backside Pain Descriptors / Indicators: Grimacing Pain Intervention(s): Limited activity within patient's tolerance    Home Living                      Prior Function  PT Goals (current goals can now be found in the care plan section) Acute Rehab PT Goals Patient Stated Goal: agreed to treatement states has needed to use rest room for sometime but fell asleep PT Goal Formulation: With patient/family Time For Goal Achievement: 03/02/19 Potential to Achieve Goals: Good Progress towards PT goals: Progressing toward goals    Frequency    Min 3X/week      PT Plan Current plan remains appropriate    Co-evaluation              AM-PAC PT "6 Clicks" Mobility   Outcome Measure  Help needed turning from your back to your side while in a flat bed without using bedrails?: A Lot Help needed  moving from lying on your back to sitting on the side of a flat bed without using bedrails?: A Lot Help needed moving to and from a bed to a chair (including a wheelchair)?: A Lot Help needed standing up from a chair using your arms (e.g., wheelchair or bedside chair)?: A Lot Help needed to walk in hospital room?: Total Help needed climbing 3-5 steps with a railing? : Total 6 Click Score: 10    End of Session Equipment Utilized During Treatment: Oxygen;Gait belt Activity Tolerance: Treatment limited secondary to medical complications (Comment);Patient tolerated treatment well Patient left: in bed;with call bell/phone within reach Nurse Communication: Mobility status PT Visit Diagnosis: Unsteadiness on feet (R26.81);Difficulty in walking, not elsewhere classified (R26.2)     Time: 5537-4827 PT Time Calculation (min) (ACUTE ONLY): 51 min  Charges:  $Therapeutic Activity: 23-37 mins $Self Care/Home Management: [place], PT    Delford Field 02/18/2019, 4:06 PM

## 2019-02-18 NOTE — Consult Note (Addendum)
Physical Medicine and Rehabilitation Consult Reason for Consult: Impaired mobility and ADLs, CIR candidate Referring Physician: Dr. Barb Merino   HPI: Anita Hunter is a 29 y.o. female with PMH of chronic back pain, morbid obesity, eczema, seasonal allergies, sleep apnea, DM2, who was admitted with progressively worsening shortness of breath and was intubated from 12/19 to 12/30. CXR showed worsening patchy opacities throughout both lungs. Currently on 4L HFNC with SPO2>90%. MaxA x2 for transfers. COVID+ on 12/14.   ROS +bilateral lower extremity burning neuropathy, decreased balance. Otherwise negative.   Past Medical History:  Diagnosis Date  . Back pain   . Eczema   . Seasonal allergies   . Sleep apnea   . Type 2 diabetes mellitus ([place]) 01/26/2019   Past Surgical History:  Procedure Laterality Date  . NO PAST SURGERIES     Family History  Problem Relation Age of Onset  . Hypertension Father   . Migraines Father   . Asthma Brother        No formal diagnosis as of 06/13/2012  . Diabetes Maternal Grandmother   . Hypertension Maternal Grandmother   . Hypertension Paternal Grandmother   . Fibroids Other        Uncertain which family member(s)   Social History:  reports that she has never smoked. She has never used smokeless tobacco. She reports that she does not drink alcohol or use drugs. Allergies:  Allergies  Allergen Reactions  . Shellfish Allergy Nausea Only   Medications Prior to Admission  Medication Sig Dispense Refill  . acetaminophen (TYLENOL) 500 MG tablet Take 1,000 mg by mouth every 6 (six) hours as needed for moderate pain or fever.    Marland Kitchen albuterol (VENTOLIN HFA) 108 (90 Base) MCG/ACT inhaler Inhale 2 puffs into the lungs every 6 (six) hours as needed for wheezing or shortness of breath.    . fluticasone (FLONASE) 50 MCG/ACT nasal spray Place 2 sprays into both nostrils daily. 16 g 6  . Phenyleph-Diphenhyd-DM-APAP ([facility] SEVERE COLD/FLU) Liquid LQPK  Take 15 mLs by mouth 2 (two) times daily as needed (cold/flu symptoms).     . triamcinolone ointment (KENALOG) 0.1 % Apply 1 application topically 2 (two) times daily. (Patient taking differently: Apply 1 application topically daily. ) 80 g 0    Home: Home Living Family/patient expects to be discharged to:: Private residence Living Arrangements: Spouse/significant other Available Help at Discharge: Family, Available 24 hours/day Type of Home: Apartment Home Access: Stairs to enter [employer] of Steps: 14 Home Layout: One level Bathroom Shower/Tub: Tub/shower unit, Architectural technologist: Programmer, systems: Yes Home Equipment: None  Functional History: Prior Function Level of Independence: Independent Comments: Working at [employer];  Functional Status:  Mobility: Bed Mobility Overal bed mobility: Needs Assistance Bed Mobility: Rolling Rolling: Max assist, +2 for physical assistance General bed mobility comments: OOB via [place] by RN Transfers Overall transfer level: Needs assistance Equipment used: Rolling walker (2 wheeled) Transfer via [place]: Stedy Transfers: Sit to/from Stand Sit to Stand: Mod assist, Max assist, +2 physical assistance, +2 safety/equipment General transfer comment: stood x 2 at RW with  max assist from low seat, did not feel  legs would support to take a step. sara stedy placed  and patient assisted to stand , using STEDY  bar to pull up with  2 mod/max assist and bed pad to boost. Patient sat onto flaps to transfer to [facility]. Stood again in [place] stedy to return to  recliner, decreased control of descent. due to low seat and standing on STEDY platform.      ADL: ADL Overall ADL's : Needs assistance/impaired Eating/Feeding: Minimal assistance, Sitting Eating/Feeding Details (indicate cue type and reason): Pt engaging with self feeding breakfast. Pt able to hold utensils. Min A for opening containers.  Grooming: Moderate  assistance, Wash/dry face, Oral care Grooming Details (indicate cue type and reason): Able to bring toothbrush to mouth after set up but difficulty sustaining attention to task; left toothbrush handing in mouth and required tactile cues to complete; assist for sequencing Upper Body Bathing: Maximal assistance, Bed level Lower Body Bathing: Total assistance, Bed level Upper Body Dressing : Total assistance, Bed level Lower Body Dressing: Total assistance, Bed level Toilet Transfer: Moderate assistance, Maximal assistance, +2 for physical assistance, +2 for safety/equipment, BSC(sit<>stand with sara stedy) Toilet Transfer Details (indicate cue type and reason): Mod-Max A to power up into standing. Use of stedy to pull into standing and block bilateral knees Toileting- Clothing Manipulation and Hygiene: Maximal assistance, Sit to/from stand, +2 for physical assistance Toileting - Clothing Manipulation Details (indicate cue type and reason): Max A for peri care with second person for assist to maintain standing. Functional mobility during ADLs: Maximal assistance, +2 for physical assistance, Moderate assistance(sara stedy to sit<>stand) General ADL Comments: Pt performing sit<>stand with stedy to transfer to [facility] for possible BM. Pt engaging in sit<>stand 6 times.   Cognition: Cognition Overall Cognitive Status: Impaired/Different from baseline Orientation Level: Oriented X4 Cognition Arousal/Alertness: Awake/alert Behavior During Therapy: WFL for tasks assessed/performed Overall Cognitive Status: Impaired/Different from baseline Area of Impairment: Attention, Memory, Following commands, Safety/judgement, Awareness, Problem solving Orientation Level: Disoriented to, Time Current Attention Level: Sustained Memory: Decreased short-term memory Following Commands: Follows one step commands consistently, Follows one step commands with increased time Safety/Judgement: Decreased awareness of  deficits Awareness: Emergent Problem Solving: Slow processing, Requires verbal cues General Comments: Pt motivated to participate in therapy. Pt continues to present with decreased process and requires increased time for following commands. Pt with decreased attention and becoming distracted by environment and requiring cues to then return to ADL or mobility.   Blood pressure 124/75, pulse (!) 109, temperature 99.1 F (37.3 C), temperature source Oral, resp. rate (!) 21, height 5' 9"  (1.753 m), weight (!) 147 kg, SpO2 91 %.   Physical Exam General: Alert and oriented x 3, No apparent distress, morbidly obese HEENT: Head is normocephalic, atraumatic, PERRLA, EOMI, sclera anicteric, oral mucosa pink and moist, dentition intact, ext ear canals clear,  Neck: Supple without JVD or lymphadenopathy Heart: Reg rate and rhythm. No murmurs rubs or gallops Chest: CTA bilaterally without wheezes, rales, or rhonchi; no distress Abdomen: Soft, non-tender, non-distended, bowel sounds positive. Extremities: No clubbing, cyanosis, or edema. Pulses are 2+ Skin: Clean and intact without signs of breakdown Neuro/Musculoskeletal: AOx3. Bilateral upper extremity tremor present with muscle testing. Motor function is grossly 5/5.  Psych: Pt's affect is appropriate. Pt is cooperative  Results for orders placed or performed during the hospital encounter of 01/26/19 (from the past 24 hour(s))  Glucose, capillary     Status: Abnormal   Collection Time: 02/17/19  3:49 PM  Result Value Ref Range   Glucose-Capillary 149 (H) 70 - 99 mg/dL  Glucose, capillary     Status: Abnormal   Collection Time: 02/17/19  7:29 PM  Result Value Ref Range   Glucose-Capillary 175 (H) 70 - 99 mg/dL  C-reactive protein     Status: Abnormal  Collection Time: 02/18/19 12:31 AM  Result Value Ref Range   CRP 5.8 (H) <1.0 mg/dL  Glucose, capillary     Status: Abnormal   Collection Time: 02/18/19 12:48 AM  Result Value Ref Range    Glucose-Capillary 120 (H) 70 - 99 mg/dL  Glucose, capillary     Status: Abnormal   Collection Time: 02/18/19  4:34 AM  Result Value Ref Range   Glucose-Capillary 116 (H) 70 - 99 mg/dL  Glucose, capillary     Status: Abnormal   Collection Time: 02/18/19  8:12 AM  Result Value Ref Range   Glucose-Capillary 126 (H) 70 - 99 mg/dL  Glucose, capillary     Status: Abnormal   Collection Time: 02/18/19 11:47 AM  Result Value Ref Range   Glucose-Capillary 162 (H) 70 - 99 mg/dL   No results found.   Assessment/Plan: Diagnosis: Impaired mobility and ADLs 1. Does the need for close, 24 hr/day medical supervision in concert with the patient's rehab needs make it unreasonable for this patient to be served in a less intensive setting? Yes 2. Co-Morbidities requiring supervision/potential complications: chronic back pain, morbid obesity, eczema, seasonal allergies, sleep apnea, DM2 3. Due to bladder management, bowel management, safety, skin/wound care, disease management, medication administration, pain management and patient education, does the patient require 24 hr/day rehab nursing? Yes 4. Does the patient require coordinated care of a physician, rehab nurse, therapy disciplines of PT, OT to address physical and functional deficits in the context of the above medical diagnosis(es)? Yes Addressing deficits in the following areas: balance, endurance, locomotion, strength, transferring, bowel/bladder control, bathing, dressing, feeding, grooming, toileting and psychosocial support 5. Can the patient actively participate in an intensive therapy program of at least 3 hrs of therapy per day at least 5 days per week? Yes 6. The potential for patient to make measurable gains while on inpatient rehab is good 7. Anticipated functional outcomes upon discharge from inpatient rehab are min assist  with PT, min assist with OT, independent with SLP. 8. Estimated rehab length of stay to reach the above functional  goals is: 2-3 weeks 9. Anticipated discharge destination: Home 10. Overall Rehab/Functional Prognosis: good  RECOMMENDATIONS: This patient's condition is appropriate for continued rehabilitative care in the following setting: CIR Patient has agreed to participate in recommended program. Yes Note that insurance prior authorization may be required for reimbursement for recommended care.  Comment: Anita Hunter would be an excellent rehabilitation candidate. Admission to CIR currently pending insurance authorization, will likely be tomorrow morning. In acute therapy today, would benefit from working on balance and proprioception due to deficits from bilateral lower extremity neuropathy. Recommend Gabapentin 365m TID for neuropathic pain control.  Thank you for this consult. I will continue to follow in Anita Hunter's care.   KIzora Ribas MD 02/18/2019

## 2019-02-18 NOTE — Progress Notes (Signed)
Inpatient Rehab Admissions:  Inpatient Rehab Consult received.  I spoke with pt at the bedside for rehabilitation assessment and to discuss goals and expectations of an inpatient rehab admission. Feel pt is appropriate for CIR at this time. She is motivated and would like to pursue our program once ready. I still need to speak with her parents as her plan is to move in with them at [place]. Once I confirm family support, I will proceed with insurance authorization process for possible admit.   Please call if questions.  Raechel Ache, OTR/L  Rehab Admissions Coordinator  [phone] 02/18/2019 1:31 PM

## 2019-02-18 NOTE — Progress Notes (Signed)
Attempted several times to upload wound photos onto [employer] app. Log-in failed. States please update to latest version. Agricultural consultant, and [employer] notified. Attempted on another phone also, same error message.

## 2019-02-19 LAB — GLUCOSE, CAPILLARY
Glucose-Capillary: 117 mg/dL — ABNORMAL HIGH (ref 70–99)
Glucose-Capillary: 120 mg/dL — ABNORMAL HIGH (ref 70–99)
Glucose-Capillary: 122 mg/dL — ABNORMAL HIGH (ref 70–99)
Glucose-Capillary: 129 mg/dL — ABNORMAL HIGH (ref 70–99)
Glucose-Capillary: 142 mg/dL — ABNORMAL HIGH (ref 70–99)
Glucose-Capillary: 91 mg/dL (ref 70–99)

## 2019-02-19 MED ORDER — POTASSIUM CHLORIDE CRYS ER 20 MEQ PO TBCR
40.0000 meq | EXTENDED_RELEASE_TABLET | Freq: Two times a day (BID) | ORAL | Status: DC
  Administered 2019-02-19 – 2019-02-20 (×3): 40 meq via ORAL
  Filled 2019-02-19 (×3): qty 2

## 2019-02-19 MED ORDER — FUROSEMIDE 20 MG PO TABS
40.0000 mg | ORAL_TABLET | Freq: Every day | ORAL | Status: DC
  Administered 2019-02-19 – 2019-02-20 (×2): 40 mg via ORAL
  Filled 2019-02-19 (×2): qty 2

## 2019-02-19 MED ORDER — ENOXAPARIN SODIUM 80 MG/0.8ML ~~LOC~~ SOLN
70.0000 mg | SUBCUTANEOUS | Status: DC
  Administered 2019-02-20: 70 mg via SUBCUTANEOUS
  Filled 2019-02-19: qty 0.8

## 2019-02-19 NOTE — Progress Notes (Signed)
Physical Therapy Treatment Patient Details Name: Anita Hunter MRN: [id number] DOB: [birth year] Today's Date: 02/19/2019    History of Present Illness 29 y.o. female with medical history significant of chronic back pain, morbid obesity, eczema, seasonal allergies, sleep apnea, type 2 diabetes admitted with progressively worse shortness of breath. Intubated 12/19-12/30. CXR Worsening patchy opacities throughout both lungs    PT Comments    Pt making great progress with mobility, she is able to tolerate much more and is needing decreased assistance to achieve it. Was able to roll in bed with min  Assist, supine to sit with mod a, able to sit unsupported EOB w/o loss of balance, able to stand at EOB several times w/ RW and mod a, stands approx 30 sec to 3mn each time, She had been c/o numbness in BLE and L hand which she states are exacerbated with sitting or WB. Pt also able to stand take few pivotal steps from bed to recliner with RW and mod a. Pt on 1L/min via [place] and managed to keep saturations in 90s throughout session.     Follow Up Recommendations  CIR;Supervision/Assistance - 24 hour     Equipment Recommendations  None recommended by PT    Recommendations for Other Services Rehab consult     Precautions / Restrictions Precautions Precautions: Fall Restrictions Weight Bearing Restrictions: No    Mobility  Bed Mobility Overal bed mobility: Needs Assistance Bed Mobility: Rolling;Supine to Sit Rolling: Min assist   Supine to sit: Mod assist        Transfers Overall transfer level: Needs assistance Equipment used: Rolling walker (2 wheeled) Transfers: Sit to/from SOmnicareSit to Stand: Mod assist Stand pivot transfers: Mod assist;+2 physical assistance       General transfer comment: completed sit<>stand x 5 at EOB w/ cues and RW, then able to take some small pivotal steps to recliner  Ambulation/Gait             General Gait Details:  will attempt at next session   Stairs             Wheelchair Mobility    Modified Rankin (Stroke Patients Only)       Balance   Sitting-balance support: Feet supported;Bilateral upper extremity supported Sitting balance-Leahy Scale: Fair Sitting balance - Comments: sits eob w/o LOB   Standing balance support: During functional activity;Bilateral upper extremity supported Standing balance-Leahy Scale: Fair Standing balance comment: stands w/ RW and maintains with min guard to min a                            Cognition Arousal/Alertness: Awake/alert Behavior During Therapy: WFL for tasks assessed/performed Overall Cognitive Status: No family/caregiver present to determine baseline cognitive functioning                                        Exercises      General Comments        Pertinent Vitals/Pain Pain Assessment: 0-10(just numbness in BLE and L hand) Pain Score: 7  Pain Location: in legs with sitting down in dependent position Pain Intervention(s): Limited activity within patient's tolerance    Home Living                      Prior Function  PT Goals (current goals can now be found in the care plan section) Acute Rehab PT Goals Patient Stated Goal: states she is having numbness and tingling in BLE and L hand PT Goal Formulation: With patient/family Time For Goal Achievement: 03/02/19 Potential to Achieve Goals: Good Progress towards PT goals: Progressing toward goals    Frequency    Min 3X/week      PT Plan Current plan remains appropriate    Co-evaluation              AM-PAC PT "6 Clicks" Mobility   Outcome Measure  Help needed turning from your back to your side while in a flat bed without using bedrails?: A Little Help needed moving from lying on your back to sitting on the side of a flat bed without using bedrails?: A Lot Help needed moving to and from a bed to a chair (including  a wheelchair)?: A Lot Help needed standing up from a chair using your arms (e.g., wheelchair or bedside chair)?: A Lot Help needed to walk in hospital room?: Total Help needed climbing 3-5 steps with a railing? : Total 6 Click Score: 11    End of Session Equipment Utilized During Treatment: Oxygen;Gait belt Activity Tolerance: Patient limited by fatigue;Patient limited by pain Patient left: in chair;with call bell/phone within reach Nurse Communication: Mobility status PT Visit Diagnosis: Unsteadiness on feet (R26.81);Difficulty in walking, not elsewhere classified (R26.2)     Time: 1430-1501 PT Time Calculation (min) (ACUTE ONLY): 31 min  Charges:  $Therapeutic Activity: 23-37 mins                     Horald Chestnut, PT    Delford Field 02/19/2019, 4:19 PM

## 2019-02-19 NOTE — Progress Notes (Signed)
Charge called RT to have pt put on BIPAP HS. RT returned call to charge and said pts has been refusing BIPAP. Will continue to monitor Pt currently on 2L [place] HS. Pt when awake room air @ 94-95% Will continue to monitor

## 2019-02-19 NOTE — H&P (Signed)
Physical Medicine and Rehabilitation Admission H&P    Chief Complaint  Patient presents with  . COVID  . Cough  : HPI: Anita Hunter is a 29 year old right-handed female with history of chronic back pain, morbid obesity with BMI of 47.85, eczema, sleep apnea, type 2 diabetes mellitus.  Per chart review patient lives with spouse independent prior to admission.  She works at a [employer].  1 level home with multiple stairs to entry.  Presented 01/26/2019 with progressive shortness of breath x2 days with sinus congestion, fatigue, malaise, low-grade fever and chills.  She had a history of exposure to positive COVID-19 2 days before the onset of her symptoms.  In the ED SARS coronavirus positive, glucose 257, hemoglobin A1c 11.1, D-dimer 0.53.  Chest x-ray no active disease however follow-up chest x-ray showed development of bilateral heterogeneous lung opacities consistent with COVID-19 pneumonia.  Patient did require high flow nasal cannula oxygen therapy.  She was placed on prednisone therapy and tapered off as well as antivirals.  Blood culture showing no growth to date.  Patient has been maintained on BiPAP. Bouts of hypokalemia and placed on supplement.  Hospital course complicated by E. coli UTI again antibiotic treatment has been completed.  Patient was treated for acute systolic congestive heart failure maintained on Lasix monitoring hydration.  She is tolerating a regular diet.  Maintain on Lovenox for DVT prophylaxis with venous Doppler studies negative.WOC consult for left ankle wound advised hydrocolloid dressing daily.  Therapy evaluations completed with recommendations of physical medicine rehab consult and patient was admitted for a comprehensive rehab program.  Voided 400cc in bedside commode this morning.  Improved mobility with PT, now standing 30sec to 1 minute at a time. Still limited by painful neuropathy in BLE and L hand. Only required 1L [place] during session and saturations  remained above 90. Mod A bed mobility.   Review of Systems  Constitutional: Positive for chills, fever and malaise/fatigue.  HENT: Positive for congestion and sinus pain. Negative for hearing loss.   Eyes: Negative for blurred vision and double vision.  Respiratory: Positive for cough and shortness of breath.   Cardiovascular: Positive for leg swelling. Negative for chest pain and palpitations.  Gastrointestinal: Positive for constipation. Negative for heartburn, nausea and vomiting.  Genitourinary: Negative for dysuria and hematuria.  Musculoskeletal: Positive for back pain and myalgias.  Skin: Negative for rash.  All other systems reviewed and are negative.  Past Medical History:  Diagnosis Date  . Back pain   . Eczema   . Seasonal allergies   . Sleep apnea   . Type 2 diabetes mellitus ([place]) 01/26/2019   Past Surgical History:  Procedure Laterality Date  . NO PAST SURGERIES     Family History  Problem Relation Age of Onset  . Hypertension Father   . Migraines Father   . Asthma Brother        No formal diagnosis as of 06/13/2012  . Diabetes Maternal Grandmother   . Hypertension Maternal Grandmother   . Hypertension Paternal Grandmother   . Fibroids Other        Uncertain which family member(s)   Social History:  reports that she has never smoked. She has never used smokeless tobacco. She reports that she does not drink alcohol or use drugs. Allergies:  Allergies  Allergen Reactions  . Shellfish Allergy Nausea Only   Medications Prior to Admission  Medication Sig Dispense Refill  . acetaminophen (TYLENOL) 500 MG tablet Take 1,000  mg by mouth every 6 (six) hours as needed for moderate pain or fever.    Marland Kitchen albuterol (VENTOLIN HFA) 108 (90 Base) MCG/ACT inhaler Inhale 2 puffs into the lungs every 6 (six) hours as needed for wheezing or shortness of breath.    . fluticasone (FLONASE) 50 MCG/ACT nasal spray Place 2 sprays into both nostrils daily. 16 g 6  .  Phenyleph-Diphenhyd-DM-APAP ([employer] SEVERE COLD/FLU) Liquid LQPK Take 15 mLs by mouth 2 (two) times daily as needed (cold/flu symptoms).     . triamcinolone ointment (KENALOG) 0.1 % Apply 1 application topically 2 (two) times daily. (Patient taking differently: Apply 1 application topically daily. ) 80 g 0    Drug Regimen Review Drug regimen was reviewed and remains appropriate with no significant issues identified  Home: Home Living Family/patient expects to be discharged to:: Private residence Living Arrangements: Spouse/significant other Available Help at Discharge: Family, Available 24 hours/day Type of Home: Apartment Home Access: Stairs to enter [employer] of Steps: 14 Home Layout: One level Bathroom Shower/Tub: Tub/shower unit, Architectural technologist: Programmer, systems: Yes Home Equipment: None   Functional History: Prior Function Level of Independence: Independent Comments: Working at [employer];   Functional Status:  Mobility: Bed Mobility Overal bed mobility: Needs Assistance Bed Mobility: Rolling, Supine to Sit Rolling: Min assist Supine to sit: Mod assist General bed mobility comments: OOB via [place] by RN Transfers Overall transfer level: Needs assistance Equipment used: Rolling walker (2 wheeled) Transfer via [place]: Stedy Transfers: Sit to/from Stand, [employer] Transfers Sit to Stand: Mod assist Stand pivot transfers: Mod assist, +2 physical assistance General transfer comment: completed sit<>stand x 5 at EOB w/ cues and RW, then able to take some small pivotal steps to recliner Ambulation/Gait General Gait Details: will attempt at next session    ADL: ADL Overall ADL's : Needs assistance/impaired Eating/Feeding: Minimal assistance, Sitting Eating/Feeding Details (indicate cue type and reason): Pt engaging with self feeding breakfast. Pt able to hold utensils. Min A for opening containers.  Grooming: Moderate  assistance, Wash/dry face, Oral care Grooming Details (indicate cue type and reason): Able to bring toothbrush to mouth after set up but difficulty sustaining attention to task; left toothbrush handing in mouth and required tactile cues to complete; assist for sequencing Upper Body Bathing: Maximal assistance, Bed level Lower Body Bathing: Total assistance, Bed level Upper Body Dressing : Total assistance, Bed level Lower Body Dressing: Total assistance, Bed level Toilet Transfer: Moderate assistance, Maximal assistance, +2 for physical assistance, +2 for safety/equipment, BSC(sit<>stand with sara stedy) Toilet Transfer Details (indicate cue type and reason): Mod-Max A to power up into standing. Use of stedy to pull into standing and block bilateral knees Toileting- Clothing Manipulation and Hygiene: Maximal assistance, Sit to/from stand, +2 for physical assistance Toileting - Clothing Manipulation Details (indicate cue type and reason): Max A for peri care with second person for assist to maintain standing. Functional mobility during ADLs: Maximal assistance, +2 for physical assistance, Moderate assistance(sara stedy to sit<>stand) General ADL Comments: Pt performing sit<>stand with stedy to transfer to [facility] for possible BM. Pt engaging in sit<>stand 6 times.   Cognition: Cognition Overall Cognitive Status: No family/caregiver present to determine baseline cognitive functioning Orientation Level: Oriented X4 Cognition Arousal/Alertness: Awake/alert Behavior During Therapy: WFL for tasks assessed/performed Overall Cognitive Status: No family/caregiver present to determine baseline cognitive functioning Area of Impairment: Attention, Memory, Following commands, Safety/judgement, Awareness, Problem solving Orientation Level: Disoriented to, Time Current Attention Level: Sustained Memory: Decreased  short-term memory Following Commands: Follows one step commands consistently, Follows one step  commands with increased time Safety/Judgement: Decreased awareness of deficits Awareness: Emergent Problem Solving: Slow processing, Requires verbal cues General Comments: initially not very talkertive but became more as tx progressed, seems to be getting better cognitively  Physical Exam: Blood pressure 119/62, pulse (!) 110, temperature 99.3 F (37.4 C), temperature source Oral, resp. rate (!) 22, height 5' 9"  (1.753 m), weight (!) 147 kg, SpO2 90 %. General: Alert and oriented x 3, No apparent distress, morbidly obese HEENT: Head is normocephalic, atraumatic, PERRLA, EOMI, sclera anicteric, oral mucosa pink and moist, dentition intact, ext ear canals clear,  Neck: Supple without JVD or lymphadenopathy Heart: Reg rate and rhythm. No murmurs rubs or gallops Chest: CTA bilaterally without wheezes, rales, or rhonchi; no distress Abdomen: Soft, non-tender, non-distended, bowel sounds positive. Extremities: No clubbing, cyanosis, or edema. Pulses are 2+ Skin: Clean and intact without signs of breakdown Neuro/Musculoskeletal: AOx3. Bilateral upper extremity tremor present with muscle testing. Motor function is grossly 4+/5. She has decreased sensation on the dorsum of her bilateral feet, worse on her left foot.   Psych: Pt's affect is appropriate. Pt is cooperative  Results for orders placed or performed during the hospital encounter of 01/26/19 (from the past 48 hour(s))  Glucose, capillary     Status: Abnormal   Collection Time: 02/18/19  8:12 AM  Result Value Ref Range   Glucose-Capillary 126 (H) 70 - 99 mg/dL  Glucose, capillary     Status: Abnormal   Collection Time: 02/18/19 11:47 AM  Result Value Ref Range   Glucose-Capillary 162 (H) 70 - 99 mg/dL  Glucose, capillary     Status: Abnormal   Collection Time: 02/18/19  3:56 PM  Result Value Ref Range   Glucose-Capillary 135 (H) 70 - 99 mg/dL  Glucose, capillary     Status: Abnormal   Collection Time: 02/18/19  8:02 PM  Result Value  Ref Range   Glucose-Capillary 121 (H) 70 - 99 mg/dL  Glucose, capillary     Status: Abnormal   Collection Time: 02/19/19 12:37 AM  Result Value Ref Range   Glucose-Capillary 129 (H) 70 - 99 mg/dL  Glucose, capillary     Status: Abnormal   Collection Time: 02/19/19  4:52 AM  Result Value Ref Range   Glucose-Capillary 117 (H) 70 - 99 mg/dL  Glucose, capillary     Status: Abnormal   Collection Time: 02/19/19  7:26 AM  Result Value Ref Range   Glucose-Capillary 120 (H) 70 - 99 mg/dL  Glucose, capillary     Status: Abnormal   Collection Time: 02/19/19 11:45 AM  Result Value Ref Range   Glucose-Capillary 142 (H) 70 - 99 mg/dL  Glucose, capillary     Status: None   Collection Time: 02/19/19  4:28 PM  Result Value Ref Range   Glucose-Capillary 91 70 - 99 mg/dL  Glucose, capillary     Status: Abnormal   Collection Time: 02/19/19  8:52 PM  Result Value Ref Range   Glucose-Capillary 122 (H) 70 - 99 mg/dL  Glucose, capillary     Status: Abnormal   Collection Time: 02/19/19 11:54 PM  Result Value Ref Range   Glucose-Capillary 104 (H) 70 - 99 mg/dL  BMP AM     Status: Abnormal   Collection Time: 02/20/19 12:54 AM  Result Value Ref Range   Sodium 136 135 - 145 mmol/L   Potassium 3.3 (L) 3.5 - 5.1 mmol/L  Chloride 97 (L) 98 - 111 mmol/L   CO2 26 22 - 32 mmol/L   Glucose, Bld 101 (H) 70 - 99 mg/dL   BUN 9 6 - 20 mg/dL   Creatinine, Ser 0.65 0.44 - 1.00 mg/dL   Calcium 8.4 (L) 8.9 - 10.3 mg/dL   GFR calc non Af Amer >60 >60 mL/min   GFR calc Af Amer >60 >60 mL/min   Anion gap 13 5 - 15    Comment: Performed at [facility], [place] [address] [place], [place] [address]  Magnesium AM     Status: None   Collection Time: 02/20/19 12:54 AM  Result Value Ref Range   Magnesium 1.7 1.7 - 2.4 mg/dL    Comment: Performed at [facility], [place] [address], [place], [place] [address]  Phosphorus AM     Status: None   Collection Time: 02/20/19 12:54 AM  Result  Value Ref Range   Phosphorus 3.2 2.5 - 4.6 mg/dL    Comment: Performed at [facility], [place] [address], [place], [place] [address]  Glucose, capillary     Status: Abnormal   Collection Time: 02/20/19  3:58 AM  Result Value Ref Range   Glucose-Capillary 121 (H) 70 - 99 mg/dL   No results found.     Medical Problem List and Plan: 1.  Debility secondary to acute on chronic respiratory failure with hypoxia related to COVID-19  -patient may shower  -ELOS/Goals: 14-18 days, ModI PT, OT, I in SLP 2.  Antithrombotics: -DVT/anticoagulation: Lovenox.  Doppler studies negative  -antiplatelet therapy: N/A 3. Pain Management/chronic back pain:   Neuropathic pain is severe, constant, and inhibiting her participation in PT and ability to upright in chair during the day. Recommend increasing Gabapentin dose to 359m TID with continued uptitration for better pain control. Can consider higher dose at night as she is not sleeping well.  4. Mood: Provide emotional support  -antipsychotic agents: N/A 5. Neuropsych: This patient is capable of making decisions on her own behalf. 6. Skin/Wound Care/left ankle wound: Hydrocolloid dressing left ankle daily 7. Fluids/Electrolytes/Nutrition: Routine in and outs with follow-up chemistries 8.  Diabetes mellitus.  Hemoglobin A1c 11.1.  NovoLog 6 units 3 times daily, Levemir 35 units twice daily.  Diabetic teaching 9.  Acute systolic congestive heart failure.  Continue Lasix 40 mg daily.  Monitor for any signs of fluid overload 10.  Morbid obesity.  BMI 47.85.  Dietary follow-up. 11. Education: educated regarding the importance of using the incentive spirometer hourly. Patient is currently achieving 1250. Recommended that she monitor her progress over time.   DLavon PaganiniAngiulli, PA-C 02/20/2019   I have personally performed a face to face diagnostic evaluation, including, but not limited to relevant history and physical exam findings, of this  patient and developed relevant assessment and plan.  Additionally, I have reviewed and concur with the physician assistant's documentation above.  KLeeroy Cha MD

## 2019-02-19 NOTE — Plan of Care (Signed)
  Problem: Education: Goal: Knowledge of General Education information will improve Description: Including pain rating scale, medication(s)/side effects and non-pharmacologic comfort measures Outcome: Progressing   Problem: Health Behavior/Discharge Planning: Goal: Ability to manage health-related needs will improve Outcome: Progressing   Problem: Clinical Measurements: Goal: Ability to maintain clinical measurements within normal limits will improve Outcome: Progressing Goal: Will remain free from infection Outcome: Progressing Goal: Diagnostic test results will improve Outcome: Progressing Goal: Respiratory complications will improve Outcome: Progressing Goal: Cardiovascular complication will be avoided Outcome: Progressing   Problem: Activity: Goal: Risk for activity intolerance will decrease Outcome: Progressing   Problem: Nutrition: Goal: Adequate nutrition will be maintained Outcome: Progressing   Problem: Coping: Goal: Level of anxiety will decrease Outcome: Progressing   Problem: Elimination: Goal: Will not experience complications related to bowel motility Outcome: Progressing Goal: Will not experience complications related to urinary retention Outcome: Progressing   Problem: Pain Managment: Goal: General experience of comfort will improve Outcome: Progressing   Problem: Safety: Goal: Ability to remain free from injury will improve Outcome: Progressing   Problem: Skin Integrity: Goal: Risk for impaired skin integrity will decrease Outcome: Progressing   Problem: Respiratory: Goal: Will maintain a patent airway Outcome: Progressing Goal: Complications related to the disease process, condition or treatment will be avoided or minimized Outcome: Progressing   Problem: Respiratory: Goal: Ability to maintain a clear airway and adequate ventilation will improve Outcome: Progressing

## 2019-02-19 NOTE — Progress Notes (Signed)
PROGRESS NOTE    Anita Hunter  JQZ:[id number] DOB: 1990/10/25 DOA: 01/26/2019 PCP: [name], MD    Brief Narrative:  29 year old lady with history of diabetes, morbid obesity, obstructive sleep apnea admitted on 12/14 with Covid pneumonia, transferred to ICU on 12/19 for worsening hypoxia.  Intubated with mechanical ventilation on 12/20-12/30.  Extubated on 12/30.  Treated for UTI and aspiration pneumonia.  Oxygen requirement improving.   Assessment & Plan:   Principal Problem:   Acute on chronic respiratory failure with hypoxia and hypercapnia (HCC) Active Problems:   Morbid obesity (HCC)   Obstructive sleep apnea   COVID-19   Type 2 diabetes mellitus (HCC)   Hypomagnesemia   NASH (nonalcoholic steatohepatitis)   Hypertriglyceridemia   ARDS (adult respiratory distress syndrome) (HCC)   Aspiration pneumonia (HCC)   Acute lower UTI   Acute systolic CHF (congestive heart failure) ([place])  COVID-19 pneumonia with acute on chronic respiratory failure with hypoxia and hypercarbia, ARDS complicated by aspiration pneumonia and underlying obstructive sleep apnea: Clinically improving.  Oxygen as needed to keep saturation more than 89%. Can use BiPAP at night, however patient has been refusing. Finished all antibiotics, steroids and antiviral therapies.  Type 2 diabetes with hyperglycemia: On Levemir.  Stable.  Acute kidney injury: Improved.  E. coli UTI: Treated and completed course.  Deconditioning: Patient is reportedly physical deconditioning.  She will benefit with aggressive inpatient therapies.  Waiting for inpatient rehab availability.  Peripheral neuropathy: We will start patient on gabapentin and gradually increase the dose.   DVT prophylaxis: Lovenox subcu Code Status: Full code Family Communication: Sister on the phone. Disposition Plan: CIR when bed available   Consultants:   PCCM  Procedures:   None  Antimicrobials:  Anti-infectives (From  admission, onward)   Start     Dose/Rate Route Frequency Ordered Stop   02/09/19 1400  ceFAZolin (ANCEF) IVPB 2g/100 mL premix     2 g 200 mL/hr over 30 Minutes Intravenous Every 8 hours 02/09/19 1248 02/14/19 2359   02/08/19 1600  vancomycin (VANCOCIN) IVPB 1000 mg/200 mL premix  Status:  Discontinued     1,000 mg 200 mL/hr over 60 Minutes Intravenous Every 8 hours 02/08/19 1334 02/09/19 1247   02/08/19 1300  cefTRIAXone (ROCEPHIN) 2 g in sodium chloride 0.9 % 100 mL IVPB  Status:  Discontinued     2 g 200 mL/hr over 30 Minutes Intravenous Every 24 hours 02/08/19 1216 02/09/19 1247   02/06/19 1800  vancomycin (VANCOREADY) IVPB 1750 mg/350 mL  Status:  Discontinued     1,750 mg 175 mL/hr over 120 Minutes Intravenous Every 12 hours 02/06/19 0500 02/08/19 1334   02/06/19 0515  vancomycin (VANCOCIN) 2,500 mg in sodium chloride 0.9 % 500 mL IVPB     2,500 mg 250 mL/hr over 120 Minutes Intravenous NOW 02/06/19 0450 02/06/19 0800   02/06/19 0500  ceFEPIme (MAXIPIME) 2 g in sodium chloride 0.9 % 100 mL IVPB  Status:  Discontinued     2 g 200 mL/hr over 30 Minutes Intravenous Every 8 hours 02/06/19 0450 02/08/19 1216   01/29/19 1000  remdesivir 100 mg in sodium chloride 0.9 % 100 mL IVPB     100 mg 200 mL/hr over 30 Minutes Intravenous Daily 01/28/19 0732 02/01/19 1123   01/28/19 1000  remdesivir 200 mg in sodium chloride 0.9% 250 mL IVPB     200 mg 580 mL/hr over 30 Minutes Intravenous Once 01/28/19 0732 01/28/19 1005  Subjective: No overnight events.  Complains of tingling sensation on the hands and legs.  Objective: Vitals:   02/19/19 0600 02/19/19 0730 02/19/19 0900 02/19/19 1147  BP:  115/65  129/86  Pulse:  (!) 110  (!) 101  Resp: _0 Temp:  99.1 F (37.3 C)  98.2 F (36.8 C)  TempSrc:  Oral  Oral  SpO2:  91% 91% 98%  Weight:      Height:        Intake/Output Summary (Last 24 hours) at 02/19/2019 1332 Last data filed at 02/18/2019 2300 Gross per 24 hour    Intake 500 ml  Output --  Net 500 ml   Filed Weights   02/11/19 0500 02/14/19 0500 02/16/19 0441  Weight: (!) 151 kg (!) 151 kg (!) 147 kg    Examination:  General exam: Appears calm and comfortable, without any distress.  On room air. Respiratory system: Clear to auscultation. Respiratory effort normal.  No added sounds. Cardiovascular system: S1 & S2 heard, RRR. No JVD, murmurs, rubs, gallops or clicks.  Trace bilateral pedal edema mostly on the dorsum of the foot. Gastrointestinal system: Abdomen is nondistended, soft and nontender. No organomegaly or masses felt. Normal bowel sounds heard.  Obese and pendulous. Central nervous system: Alert and oriented. No focal neurological deficits. Extremities: Symmetric 5 x 5 power. Skin: No rashes, lesions or ulcers Psychiatry: Judgement and insight appear normal. Mood & affect appropriate.     Data Reviewed: I have personally reviewed following labs and imaging studies  CBC: Recent Labs  Lab 02/13/19 0300 02/14/19 0437 02/15/19 1503 02/16/19 1121  WBC 11.7* 9.0  --  6.9  HGB 11.7* 11.5* 10.5* 11.6*  HCT 40.1 39.4 31.0* 38.7  MCV 94.8 94.7  --  94.6  PLT 141* 139*  --  616   Basic Metabolic Panel: Recent Labs  Lab 02/13/19 0300 02/14/19 0635 02/15/19 0500 02/15/19 1503 02/16/19 1121  NA 147* 144 144 140 141  K 3.4* 3.2* 3.1* 2.9* 3.7  CL 98 98 100  --  99  CO2 35* 29 34*  --  32  GLUCOSE 208* 198* 159*  --  165*  BUN 29* 23* 20  --  15  CREATININE 0.86 0.66 0.59  --  0.66  CALCIUM 8.3* 8.0* 8.3*  --  8.3*  MG  --  2.1 1.9  --  1.8   GFR: Estimated Creatinine Clearance: 164.3 mL/min (by C-G formula based on SCr of 0.66 mg/dL). Liver Function Tests: Recent Labs  Lab 02/16/19 1121  AST 64*  ALT 36  ALKPHOS 40  BILITOT 1.0  PROT 6.5  ALBUMIN 3.0*   No results for input(s): LIPASE, AMYLASE in the last 168 hours. No results for input(s): AMMONIA in the last 168 hours. Coagulation Profile: No results for  input(s): INR, PROTIME in the last 168 hours. Cardiac Enzymes: No results for input(s): CKTOTAL, CKMB, CKMBINDEX, TROPONINI in the last 168 hours. BNP (last 3 results) No results for input(s): PROBNP in the last 8760 hours. HbA1C: No results for input(s): HGBA1C in the last 72 hours. CBG: Recent Labs  Lab 02/18/19 2002 02/19/19 0037 02/19/19 0452 02/19/19 0726 02/19/19 1145  GLUCAP 121* 129* 117* 120* 142*   Lipid Profile: No results for input(s): CHOL, HDL, LDLCALC, TRIG, CHOLHDL, LDLDIRECT in the last 72 hours. Thyroid Function Tests: No results for input(s): TSH, T4TOTAL, FREET4, T3FREE, THYROIDAB in the last 72 hours. Anemia Panel: No results for input(s): VITAMINB12, FOLATE, FERRITIN,  TIBC, IRON, RETICCTPCT in the last 72 hours. Sepsis Labs: Recent Labs  Lab 02/15/19 1600  PROCALCITON 0.10    Recent Results (from the past 240 hour(s))  Urine culture     Status: None   Collection Time: 02/10/19  3:15 PM   Specimen: Urine, Catheterized  Result Value Ref Range Status   Specimen Description   Final    URINE, CATHETERIZED Performed at [facility] [address], [place], [place] [address]    Special Requests   Final    NONE Performed at [facility], [place] [address] [place], [place] [address]    Culture   Final    NO GROWTH Performed at [facility], [place] [address], [place], [place] [address]    Report Status 02/11/2019 FINAL  Final  Culture, blood (routine x 2)     Status: None   Collection Time: 02/10/19  3:30 PM   Specimen: BLOOD  Result Value Ref Range Status   Specimen Description   Final    BLOOD LEFT HAND Performed at [facility] [address], [place], [place] [address]    Special Requests   Final    BOTTLES DRAWN AEROBIC ONLY Blood Culture adequate volume Performed at [facility] [address] [place], [place] [address]    Culture   Final    NO GROWTH 5  DAYS Performed at [facility], [place] [address], [place], [place] [address]    Report Status 02/15/2019 FINAL  Final  Culture, blood (routine x 2)     Status: None   Collection Time: 02/10/19  3:40 PM   Specimen: BLOOD  Result Value Ref Range Status   Specimen Description   Final    BLOOD LEFT HAND Performed at [facility] [address] [place], [place] [address]    Special Requests   Final    BOTTLES DRAWN AEROBIC ONLY Blood Culture adequate volume Performed at [facility] [address] [place], [place] [address]    Culture   Final    NO GROWTH 5 DAYS Performed at [facility], [place] [address], [place], [place] [address]    Report Status 02/15/2019 FINAL  Final  Culture, respiratory (non-expectorated)     Status: None   Collection Time: 02/10/19  5:34 PM   Specimen: Tracheal Aspirate; Respiratory  Result Value Ref Range Status   Specimen Description   Final    TRACHEAL ASPIRATE Performed at [facility] [address], [place], [place] [address]    Special Requests   Final    NONE Performed at [facility], [place] [address], [place], [place] [address]    Gram Stain   Final    ABUNDANT WBC PRESENT, PREDOMINANTLY PMN RARE GRAM POSITIVE COCCI Performed at [facility], [place] [address], [place], [place] [address]    Culture RARE STAPHYLOCOCCUS AUREUS  Final   Report Status 02/13/2019 FINAL  Final   Organism ID, Bacteria STAPHYLOCOCCUS AUREUS  Final      Susceptibility   Staphylococcus aureus - MIC*    CIPROFLOXACIN <=0.5 SENSITIVE Sensitive     ERYTHROMYCIN <=0.25 SENSITIVE Sensitive     GENTAMICIN <=0.5 SENSITIVE Sensitive     OXACILLIN 0.5 SENSITIVE Sensitive     TETRACYCLINE <=1 SENSITIVE Sensitive     VANCOMYCIN 1 SENSITIVE Sensitive     TRIMETH/SULFA <=10 SENSITIVE Sensitive  CLINDAMYCIN <=0.25 SENSITIVE Sensitive     RIFAMPIN <=0.5 SENSITIVE Sensitive      Inducible Clindamycin NEGATIVE Sensitive     * RARE STAPHYLOCOCCUS AUREUS         Radiology Studies: No results found.      Scheduled Meds: . chlorhexidine  15 mL Mouth/Throat BID  . Chlorhexidine Gluconate Cloth  6 each Topical Daily  . [START ON 02/20/2019] enoxaparin (LOVENOX) injection  70 mg Subcutaneous Q24H  . famotidine  20 mg Oral Daily  . feeding supplement (ENSURE ENLIVE)  237 mL Oral TID BM  . feeding supplement (PRO-STAT SUGAR FREE 64)  30 mL Oral TID WC  . furosemide  40 mg Oral Daily  . gabapentin  100 mg Oral BID  . insulin aspart  0-20 Units Subcutaneous Q4H  . insulin aspart  6 Units Subcutaneous TID WC  . insulin detemir  35 Units Subcutaneous BID  . insulin starter kit- pen needles  1 kit Other Once  . magnesium hydroxide  15 mL Oral Daily  . multivitamin with minerals  1 tablet Oral Daily  . pantoprazole  40 mg Oral Daily  . potassium chloride  40 mEq Oral BID  . sodium chloride flush  10-40 mL Intracatheter Q12H   Continuous Infusions: . sodium chloride 10 mL/hr at 02/13/19 1800  . sodium chloride 10 mL/hr at 02/13/19 1800     LOS: 24 days    Time spent: 25 minutes    Barb Merino, MD [facility] Pager [phone]

## 2019-02-19 NOTE — Progress Notes (Signed)
Inpatient Rehabilitation-Admissions Coordinator   I have insurance approval for CIR from [employer]. Pt is aware. We reviewed insurance benefits and she would like to proceed with IP Rehab admit once ready. I do not have a bed available today for this patient. Will follow up tomorrow to see if I can offer this patient a bed.   Please call if questions.   Raechel Ache, OTR/L  Rehab Admissions Coordinator  [phone] 02/19/2019 2:36 PM

## 2019-02-19 NOTE — Consult Note (Signed)
[facility] Nurse Consult Note: Consult completed remotely after review of record. Reason for Consult: left ankle owund Wound type: Despite having phone conversations with both the primary RN yesterday, and another RN covering for the primary RN, I was unable to obtain a photo or description of the left ankle wound.  I have reviewed the flowsheet documentation, as this is the only information available to me. Pressure Injury POA: Yes/No/NA--unknown Measurement: see flowsheet section for details of the area. Wound bed: Drainage (amount, consistency, odor)  Periwound: Dressing procedure/placement/frequency:  Place a hydrocolloid dressing on the left ankle wound. Change daily. Monitor the wound area(s) for worsening of condition such as: Signs/symptoms of infection,  Increase in size,  Development of or worsening of odor, Development of pain, or increased pain at the affected locations.  Notify the medical team if any of these develop.  [facility] nurse will not follow at this time.  Please re-consult the [facility] team if needed.  Val Riles, RN, MSN, CWOCN, CNS-BC, pager [phone]

## 2019-02-20 ENCOUNTER — Inpatient Hospital Stay (HOSPITAL_COMMUNITY)
Admission: RE | Admit: 2019-02-20 | Discharge: 2019-03-03 | DRG: 945 | Disposition: A | Discharge status: Home Health | Payer: BC Managed Care – PPO | Source: Other Acute Inpatient Hospital | Attending: Physical Medicine & Rehabilitation | Admitting: Physical Medicine & Rehabilitation

## 2019-02-20 DIAGNOSIS — G8929 Other chronic pain: Secondary | ICD-10-CM | POA: Diagnosis present

## 2019-02-20 DIAGNOSIS — Z6841 Body Mass Index (BMI) 40.0 and over, adult: Secondary | ICD-10-CM

## 2019-02-20 DIAGNOSIS — D62 Acute posthemorrhagic anemia: Secondary | ICD-10-CM

## 2019-02-20 DIAGNOSIS — Z833 Family history of diabetes mellitus: Secondary | ICD-10-CM | POA: Diagnosis not present

## 2019-02-20 DIAGNOSIS — D649 Anemia, unspecified: Secondary | ICD-10-CM

## 2019-02-20 DIAGNOSIS — F419 Anxiety disorder, unspecified: Secondary | ICD-10-CM | POA: Diagnosis present

## 2019-02-20 DIAGNOSIS — I5021 Acute systolic (congestive) heart failure: Secondary | ICD-10-CM

## 2019-02-20 DIAGNOSIS — Z79891 Long term (current) use of opiate analgesic: Secondary | ICD-10-CM

## 2019-02-20 DIAGNOSIS — G4730 Sleep apnea, unspecified: Secondary | ICD-10-CM | POA: Diagnosis present

## 2019-02-20 DIAGNOSIS — Z8249 Family history of ischemic heart disease and other diseases of the circulatory system: Secondary | ICD-10-CM

## 2019-02-20 DIAGNOSIS — E876 Hypokalemia: Secondary | ICD-10-CM | POA: Diagnosis present

## 2019-02-20 DIAGNOSIS — G6281 Critical illness polyneuropathy: Secondary | ICD-10-CM

## 2019-02-20 DIAGNOSIS — R5381 Other malaise: Principal | ICD-10-CM | POA: Diagnosis present

## 2019-02-20 DIAGNOSIS — Z825 Family history of asthma and other chronic lower respiratory diseases: Secondary | ICD-10-CM | POA: Diagnosis not present

## 2019-02-20 DIAGNOSIS — L89312 Pressure ulcer of right buttock, stage 2: Secondary | ICD-10-CM | POA: Diagnosis present

## 2019-02-20 DIAGNOSIS — R000 Tachycardia, unspecified: Secondary | ICD-10-CM

## 2019-02-20 DIAGNOSIS — J9621 Acute and chronic respiratory failure with hypoxia: Secondary | ICD-10-CM | POA: Diagnosis present

## 2019-02-20 DIAGNOSIS — L8990 Pressure ulcer of unspecified site, unspecified stage: Secondary | ICD-10-CM | POA: Insufficient documentation

## 2019-02-20 DIAGNOSIS — Z794 Long term (current) use of insulin: Secondary | ICD-10-CM

## 2019-02-20 DIAGNOSIS — L89322 Pressure ulcer of left buttock, stage 2: Secondary | ICD-10-CM | POA: Diagnosis present

## 2019-02-20 DIAGNOSIS — Z79899 Other long term (current) drug therapy: Secondary | ICD-10-CM

## 2019-02-20 DIAGNOSIS — Z8616 Personal history of COVID-19: Secondary | ICD-10-CM

## 2019-02-20 DIAGNOSIS — M549 Dorsalgia, unspecified: Secondary | ICD-10-CM | POA: Diagnosis present

## 2019-02-20 DIAGNOSIS — E1140 Type 2 diabetes mellitus with diabetic neuropathy, unspecified: Secondary | ICD-10-CM | POA: Diagnosis present

## 2019-02-20 DIAGNOSIS — E1165 Type 2 diabetes mellitus with hyperglycemia: Secondary | ICD-10-CM

## 2019-02-20 DIAGNOSIS — Z91013 Allergy to seafood: Secondary | ICD-10-CM | POA: Diagnosis not present

## 2019-02-20 DIAGNOSIS — K5900 Constipation, unspecified: Secondary | ICD-10-CM | POA: Diagnosis not present

## 2019-02-20 DIAGNOSIS — M792 Neuralgia and neuritis, unspecified: Secondary | ICD-10-CM

## 2019-02-20 DIAGNOSIS — L89520 Pressure ulcer of left ankle, unstageable: Secondary | ICD-10-CM | POA: Diagnosis present

## 2019-02-20 LAB — CBC
HCT: 37.6 % (ref 36.0–46.0)
Hemoglobin: 11.8 g/dL — ABNORMAL LOW (ref 12.0–15.0)
MCH: 27.8 pg (ref 26.0–34.0)
MCHC: 31.4 g/dL (ref 30.0–36.0)
MCV: 88.7 fL (ref 80.0–100.0)
Platelets: 332 10*3/uL (ref 150–400)
RBC: 4.24 MIL/uL (ref 3.87–5.11)
RDW: 14.6 % (ref 11.5–15.5)
WBC: 6.7 10*3/uL (ref 4.0–10.5)
nRBC: 0 % (ref 0.0–0.2)

## 2019-02-20 LAB — BASIC METABOLIC PANEL
Anion gap: 13 (ref 5–15)
BUN: 9 mg/dL (ref 6–20)
CO2: 26 mmol/L (ref 22–32)
Calcium: 8.4 mg/dL — ABNORMAL LOW (ref 8.9–10.3)
Chloride: 97 mmol/L — ABNORMAL LOW (ref 98–111)
Creatinine, Ser: 0.65 mg/dL (ref 0.44–1.00)
GFR calc Af Amer: >60 mL/min (ref >60)
GFR calc non Af Amer: >60 mL/min (ref >60)
Glucose, Bld: 101 mg/dL — ABNORMAL HIGH (ref 70–99)
Potassium: 3.3 mmol/L — ABNORMAL LOW (ref 3.5–5.1)
Sodium: 136 mmol/L (ref 135–145)

## 2019-02-20 LAB — GLUCOSE, CAPILLARY
Glucose-Capillary: 104 mg/dL — ABNORMAL HIGH (ref 70–99)
Glucose-Capillary: 109 mg/dL — ABNORMAL HIGH (ref 70–99)
Glucose-Capillary: 121 mg/dL — ABNORMAL HIGH (ref 70–99)
Glucose-Capillary: 126 mg/dL — ABNORMAL HIGH (ref 70–99)
Glucose-Capillary: 173 mg/dL — ABNORMAL HIGH (ref 70–99)
Glucose-Capillary: 75 mg/dL (ref 70–99)

## 2019-02-20 LAB — CREATININE, SERUM
Creatinine, Ser: 0.67 mg/dL (ref 0.44–1.00)
GFR calc Af Amer: >60 mL/min (ref >60)
GFR calc non Af Amer: >60 mL/min (ref >60)

## 2019-02-20 LAB — PHOSPHORUS: Phosphorus: 3.2 mg/dL (ref 2.5–4.6)

## 2019-02-20 LAB — MAGNESIUM: Magnesium: 1.7 mg/dL (ref 1.7–2.4)

## 2019-02-20 MED ORDER — ENOXAPARIN SODIUM 80 MG/0.8ML ~~LOC~~ SOLN
70.0000 mg | SUBCUTANEOUS | Status: DC
  Administered 2019-02-21 – 2019-03-03 (×11): 70 mg via SUBCUTANEOUS
  Filled 2019-02-20 (×11): qty 0.7

## 2019-02-20 MED ORDER — FAMOTIDINE 20 MG PO TABS
20.0000 mg | ORAL_TABLET | Freq: Every day | ORAL | Status: DC
  Administered 2019-02-20 – 2019-03-03 (×12): 20 mg via ORAL
  Filled 2019-02-20 (×12): qty 1

## 2019-02-20 MED ORDER — ACETAMINOPHEN 325 MG PO TABS
650.0000 mg | ORAL_TABLET | Freq: Four times a day (QID) | ORAL | Status: DC | PRN
  Administered 2019-02-20 – 2019-03-03 (×12): 650 mg via ORAL
  Filled 2019-02-20 (×17): qty 2

## 2019-02-20 MED ORDER — GABAPENTIN 100 MG PO CAPS
200.0000 mg | ORAL_CAPSULE | Freq: Three times a day (TID) | ORAL | Status: DC
  Administered 2019-02-20 (×2): 200 mg via ORAL
  Filled 2019-02-20 (×2): qty 2

## 2019-02-20 MED ORDER — FUROSEMIDE 40 MG PO TABS: 40.0000 mg | ORAL_TABLET | Freq: Every day | ORAL | Status: DC

## 2019-02-20 MED ORDER — IPRATROPIUM-ALBUTEROL 20-100 MCG/ACT IN AERS
2.0000 | INHALATION_SPRAY | Freq: Four times a day (QID) | RESPIRATORY_TRACT | Status: DC | PRN
  Filled 2019-02-20: qty 4

## 2019-02-20 MED ORDER — INSULIN DETEMIR 100 UNIT/ML ~~LOC~~ SOLN
35.0000 [IU] | Freq: Two times a day (BID) | SUBCUTANEOUS | Status: DC
  Administered 2019-02-20 – 2019-03-03 (×22): 35 [IU] via SUBCUTANEOUS
  Filled 2019-02-20 (×25): qty 0.35

## 2019-02-20 MED ORDER — INSULIN ASPART 100 UNIT/ML ~~LOC~~ SOLN: 0.0000 [IU] | SUBCUTANEOUS | 11 refills | Status: DC

## 2019-02-20 MED ORDER — ACETAMINOPHEN 650 MG RE SUPP
650.0000 mg | RECTAL | Status: DC | PRN
  Administered 2019-02-24: 650 mg via RECTAL
  Filled 2019-02-20: qty 1

## 2019-02-20 MED ORDER — SORBITOL 70 % SOLN
30.0000 mL | Freq: Every day | Status: DC | PRN
  Administered 2019-02-26: 30 mL via ORAL
  Filled 2019-02-20: qty 30

## 2019-02-20 MED ORDER — ADULT MULTIVITAMIN W/MINERALS CH: 1.0000 | ORAL_TABLET | Freq: Every day | ORAL | Status: DC

## 2019-02-20 MED ORDER — MAGNESIUM OXIDE 400 (241.3 MG) MG PO TABS: 400.0000 mg | ORAL_TABLET | Freq: Two times a day (BID) | ORAL | Status: DC

## 2019-02-20 MED ORDER — PANTOPRAZOLE SODIUM 40 MG PO TBEC
40.0000 mg | DELAYED_RELEASE_TABLET | Freq: Every day | ORAL | Status: DC
  Administered 2019-02-21 – 2019-03-03 (×11): 40 mg via ORAL
  Filled 2019-02-20 (×11): qty 1

## 2019-02-20 MED ORDER — INSULIN ASPART 100 UNIT/ML ~~LOC~~ SOLN
0.0000 [IU] | SUBCUTANEOUS | Status: DC
  Administered 2019-02-20: 4 [IU] via SUBCUTANEOUS
  Administered 2019-02-21 (×2): 3 [IU] via SUBCUTANEOUS
  Administered 2019-02-21: 7 [IU] via SUBCUTANEOUS

## 2019-02-20 MED ORDER — INSULIN ASPART 100 UNIT/ML ~~LOC~~ SOLN: 6.0000 [IU] | Freq: Three times a day (TID) | SUBCUTANEOUS | 11 refills | Status: DC

## 2019-02-20 MED ORDER — POTASSIUM CHLORIDE CRYS ER 20 MEQ PO TBCR: 40.0000 meq | EXTENDED_RELEASE_TABLET | Freq: Every day | ORAL | Status: DC

## 2019-02-20 MED ORDER — TRAMADOL HCL 50 MG PO TABS: 50.0000 mg | ORAL_TABLET | Freq: Four times a day (QID) | ORAL | Status: DC | PRN

## 2019-02-20 MED ORDER — ADULT MULTIVITAMIN W/MINERALS CH
1.0000 | ORAL_TABLET | Freq: Every day | ORAL | Status: DC
  Administered 2019-02-21 – 2019-03-03 (×11): 1 via ORAL
  Filled 2019-02-20 (×12): qty 1

## 2019-02-20 MED ORDER — TRAMADOL HCL 50 MG PO TABS
50.0000 mg | ORAL_TABLET | Freq: Four times a day (QID) | ORAL | Status: DC | PRN
  Administered 2019-02-20: 50 mg via ORAL
  Filled 2019-02-20: qty 1

## 2019-02-20 MED ORDER — PANTOPRAZOLE SODIUM 40 MG PO TBEC: 40.0000 mg | DELAYED_RELEASE_TABLET | Freq: Every day | ORAL | Status: DC

## 2019-02-20 MED ORDER — FUROSEMIDE 40 MG PO TABS
40.0000 mg | ORAL_TABLET | Freq: Every day | ORAL | Status: DC
  Administered 2019-02-21 – 2019-03-01 (×9): 40 mg via ORAL
  Filled 2019-02-20 (×9): qty 1

## 2019-02-20 MED ORDER — MAGNESIUM OXIDE 400 (241.3 MG) MG PO TABS
400.0000 mg | ORAL_TABLET | Freq: Two times a day (BID) | ORAL | Status: DC
  Administered 2019-02-20: 400 mg via ORAL
  Filled 2019-02-20: qty 1

## 2019-02-20 MED ORDER — ENOXAPARIN SODIUM 80 MG/0.8ML ~~LOC~~ SOLN: 70.0000 mg | Freq: Two times a day (BID) | SUBCUTANEOUS | Status: DC

## 2019-02-20 MED ORDER — INSULIN DETEMIR 100 UNIT/ML ~~LOC~~ SOLN: 35.0000 [IU] | Freq: Two times a day (BID) | SUBCUTANEOUS | 11 refills | Status: DC

## 2019-02-20 MED ORDER — POTASSIUM CHLORIDE CRYS ER 20 MEQ PO TBCR
40.0000 meq | EXTENDED_RELEASE_TABLET | Freq: Two times a day (BID) | ORAL | Status: DC
  Administered 2019-02-20 – 2019-03-03 (×22): 40 meq via ORAL
  Filled 2019-02-20 (×23): qty 2

## 2019-02-20 MED ORDER — INSULIN ASPART 100 UNIT/ML ~~LOC~~ SOLN
6.0000 [IU] | Freq: Three times a day (TID) | SUBCUTANEOUS | Status: DC
  Administered 2019-02-21 – 2019-03-03 (×31): 6 [IU] via SUBCUTANEOUS

## 2019-02-20 MED ORDER — GABAPENTIN 100 MG PO CAPS
200.0000 mg | ORAL_CAPSULE | Freq: Three times a day (TID) | ORAL | Status: DC
  Administered 2019-02-20: 200 mg via ORAL
  Filled 2019-02-20: qty 2

## 2019-02-20 MED ORDER — GABAPENTIN 100 MG PO CAPS: 200.0000 mg | ORAL_CAPSULE | Freq: Three times a day (TID) | ORAL | Status: DC

## 2019-02-20 NOTE — Plan of Care (Signed)
  Problem: Education: Goal: Knowledge of General Education information will improve Description: Including pain rating scale, medication(s)/side effects and non-pharmacologic comfort measures 02/20/2019 1514 by Levie Heritage, RN Outcome: Adequate for Discharge 02/20/2019 1514 by Levie Heritage, RN Outcome: Progressing   Problem: Health Behavior/Discharge Planning: Goal: Ability to manage health-related needs will improve 02/20/2019 1514 by Levie Heritage, RN Outcome: Adequate for Discharge 02/20/2019 1514 by Levie Heritage, RN Outcome: Progressing   Problem: Clinical Measurements: Goal: Ability to maintain clinical measurements within normal limits will improve 02/20/2019 1514 by Levie Heritage, RN Outcome: Adequate for Discharge 02/20/2019 1514 by Levie Heritage, RN Outcome: Progressing Goal: Will remain free from infection 02/20/2019 1514 by Levie Heritage, RN Outcome: Adequate for Discharge 02/20/2019 1514 by Levie Heritage, RN Outcome: Progressing Goal: Diagnostic test results will improve 02/20/2019 1514 by Levie Heritage, RN Outcome: Adequate for Discharge 02/20/2019 1514 by Levie Heritage, RN Outcome: Progressing Goal: Respiratory complications will improve 02/20/2019 1514 by Levie Heritage, RN Outcome: Adequate for Discharge 02/20/2019 1514 by Levie Heritage, RN Outcome: Progressing Goal: Cardiovascular complication will be avoided 02/20/2019 1514 by Levie Heritage, RN Outcome: Adequate for Discharge 02/20/2019 1514 by Levie Heritage, RN Outcome: Progressing   Problem: Activity: Goal: Risk for activity intolerance will decrease 02/20/2019 1514 by Levie Heritage, RN Outcome: Adequate for Discharge 02/20/2019 1514 by Levie Heritage, RN Outcome: Progressing   Problem: Nutrition: Goal: Adequate nutrition will be maintained 02/20/2019 1514 by Levie Heritage, RN Outcome: Adequate for Discharge 02/20/2019 1514 by Levie Heritage, RN Outcome: Progressing   Problem: Coping: Goal: Level of anxiety will  decrease 02/20/2019 1514 by Levie Heritage, RN Outcome: Adequate for Discharge 02/20/2019 1514 by Levie Heritage, RN Outcome: Progressing   Problem: Elimination: Goal: Will not experience complications related to bowel motility 02/20/2019 1514 by Levie Heritage, RN Outcome: Adequate for Discharge 02/20/2019 1514 by Levie Heritage, RN Outcome: Progressing Goal: Will not experience complications related to urinary retention 02/20/2019 1514 by Levie Heritage, RN Outcome: Adequate for Discharge 02/20/2019 1514 by Levie Heritage, RN Outcome: Progressing   Problem: Pain Managment: Goal: General experience of comfort will improve 02/20/2019 1514 by Levie Heritage, RN Outcome: Adequate for Discharge 02/20/2019 1514 by Levie Heritage, RN Outcome: Progressing   Problem: Safety: Goal: Ability to remain free from injury will improve 02/20/2019 1514 by Levie Heritage, RN Outcome: Adequate for Discharge 02/20/2019 1514 by Levie Heritage, RN Outcome: Progressing   Problem: Skin Integrity: Goal: Risk for impaired skin integrity will decrease 02/20/2019 1514 by Levie Heritage, RN Outcome: Adequate for Discharge 02/20/2019 1514 by Levie Heritage, RN Outcome: Progressing   Problem: Respiratory: Goal: Will maintain a patent airway 02/20/2019 1514 by Levie Heritage, RN Outcome: Adequate for Discharge 02/20/2019 1514 by Levie Heritage, RN Outcome: Progressing Goal: Complications related to the disease process, condition or treatment will be avoided or minimized 02/20/2019 1514 by Levie Heritage, RN Outcome: Adequate for Discharge 02/20/2019 1514 by Levie Heritage, RN Outcome: Progressing   Problem: Respiratory: Goal: Ability to maintain a clear airway and adequate ventilation will improve 02/20/2019 1514 by Levie Heritage, RN Outcome: Adequate for Discharge 02/20/2019 1514 by Levie Heritage, RN Outcome: Progressing

## 2019-02-20 NOTE — Plan of Care (Signed)
Patient is currently on 3L HFNC, her oxygen saturations have maintained above 89%. She has taken her electrodes and oxygen off several times throughout the night and stated she didn't know that she had taken them off. Also, she had c/o  numbness and tingling in her left foot and only tingling in the right foot. I explained to her the side effects of diabetes and gabapentin she is taking for neuropathy. She was able to communicate her patient teaching back in a teachback method letting me know that she understood. She got up to use the bedside commode once this morning and voided 400cc. Her urine is amber in color, clear and with no odor. She is currently in bed resting prone.

## 2019-02-20 NOTE — Progress Notes (Signed)
Physical Therapy Treatment Patient Details Name: Anita Hunter MRN: [id number] DOB: 04/25/1990 Today's Date: 02/20/2019    History of Present Illness 29 y.o. female with medical history significant of chronic back pain, morbid obesity, eczema, seasonal allergies, sleep apnea, type 2 diabetes admitted with progressively worse shortness of breath. Intubated 12/19-12/30. CXR Worsening patchy opacities throughout both lungs    PT Comments    Pt making great progress with mobility today. Pt was able to roll in bed with set up and stand by assist, was able to complete bed mob with mod a, sits unsupported EOB with fair trunk control (unable to sustain long sec to numbness and tingling in legs). Sit<>stand with RW and mod a x 2 able to take steps approx 523f with RW and mod a x 2, then 748fwith RW and mod a x 2 (for safety). Unable to tolerate prolonged positions sitting or standing sec to pain in BLE. Pt now on room air and min desat noted with activity was mid 80s.    Follow Up Recommendations  CIR;Supervision/Assistance - 24 hour     Equipment Recommendations  None recommended by PT    Recommendations for Other Services       Precautions / Restrictions Precautions Precautions: Fall Restrictions Weight Bearing Restrictions: No    Mobility  Bed Mobility Overal bed mobility: Needs Assistance Bed Mobility: Rolling;Supine to Sit;Sit to Supine Rolling: Min guard   Supine to sit: Mod assist Sit to supine: Mod assist(needs assist with getting legs to and from bed)      Transfers Overall transfer level: Needs assistance Equipment used: Rolling walker (2 wheeled) Transfers: Sit to/from [facility] to Stand: Mod assist;+2 safety/equipment Stand pivot transfers: Mod assist;+2 safety/equipment       General transfer comment: able to stand from bed and take few steps to [facility] take steps to recliner  Ambulation/Gait Ambulation/Gait assistance:  Mod assist;+2 safety/equipment Gait Distance (Feet): 7 Feet Assistive device: Rolling walker (2 wheeled) Gait Pattern/deviations: Step-to pattern;Wide base of support;Antalgic;Ataxic     General Gait Details: ambulated 1 x [id number] mod a 2 and RW, 1 x 7ft44f RW mod a x 2   Stairs             Wheelchair Mobility    Modified Rankin (Stroke Patients Only)       Balance Overall balance assessment: Needs assistance Sitting-balance support: Feet supported Sitting balance-Leahy Scale: Fair Sitting balance - Comments: sits EOB w/o support   Standing balance support: During functional activity;Bilateral upper extremity supported Standing balance-Leahy Scale: Fair                              Cognition Arousal/Alertness: Awake/alert Behavior During Therapy: WFL for tasks assessed/performed Overall Cognitive Status: No family/caregiver present to determine baseline cognitive functioning                                        Exercises Other Exercises Other Exercises: instucted on BLE ther ex for circulation    General Comments        Pertinent Vitals/Pain Pain Assessment: Faces Faces Pain Scale: Hurts whole lot Pain Location: in legs with some WB and at times sitting in recliner Pain Descriptors / Indicators: Grimacing;Guarding;Moaning Pain Intervention(s): Limited activity within patient's tolerance;Monitored during session  Home Living                      Prior Function            PT Goals (current goals can now be found in the care plan section) Acute Rehab PT Goals Patient Stated Goal: still having numbness and tingling in BLE PT Goal Formulation: With patient/family Time For Goal Achievement: 03/02/19 Potential to Achieve Goals: Good Progress towards PT goals: Progressing toward goals    Frequency    Min 3X/week      PT Plan Current plan remains appropriate    Co-evaluation              AM-PAC  PT "6 Clicks" Mobility   Outcome Measure  Help needed turning from your back to your side while in a flat bed without using bedrails?: A Little Help needed moving from lying on your back to sitting on the side of a flat bed without using bedrails?: A Lot Help needed moving to and from a bed to a chair (including a wheelchair)?: A Lot Help needed standing up from a chair using your arms (e.g., wheelchair or bedside chair)?: A Lot Help needed to walk in hospital room?: A Lot Help needed climbing 3-5 steps with a railing? : Total 6 Click Score: 12    End of Session Equipment Utilized During Treatment: Gait belt Activity Tolerance: Patient limited by pain;Patient limited by fatigue Patient left: in chair;with call bell/phone within reach Nurse Communication: Mobility status PT Visit Diagnosis: Unsteadiness on feet (R26.81);Difficulty in walking, not elsewhere classified (R26.2)     Time: 2595-6387 PT Time Calculation (min) (ACUTE ONLY): 28 min  Charges:  $Gait Training: 8-22 mins $Therapeutic Activity: 8-22 mins                     Horald Chestnut, PT    Delford Field 02/20/2019, 1:04 PM

## 2019-02-20 NOTE — Progress Notes (Signed)
Lonzo Cloud received report for patient with Inpatient rehab and wallet and keys given to patient.

## 2019-02-20 NOTE — Progress Notes (Signed)
[name], MD  Physician  Physical Medicine and Rehabilitation  Consult Note  Addendum  Date of Service:  02/19/2019  9:53 AM      Related encounter: ED to Hosp-Admission (Discharged) from 01/26/2019 in [facility]            Physical Medicine and Rehabilitation Consult Reason for Consult: Impaired mobility and ADLs, CIR candidate Referring Physician: Dr. Barb Merino     HPI: CAYDENCE KOENIG is a 29 y.o. female with PMH of chronic back pain, morbid obesity, eczema, seasonal allergies, sleep apnea, DM2, who was admitted with progressively worsening shortness of breath and was intubated from 12/19 to 12/30. CXR showed worsening patchy opacities throughout both lungs. Currently on 4L HFNC with SPO2>90%. MaxA x2 for transfers. COVID+ on 12/14.    ROS +bilateral lower extremity burning neuropathy, decreased balance. Otherwise negative.        Past Medical History:  Diagnosis Date  . Back pain    . Eczema    . Seasonal allergies    . Sleep apnea    . Type 2 diabetes mellitus ([place]) 01/26/2019         Past Surgical History:  Procedure Laterality Date  . NO PAST SURGERIES             Family History  Problem Relation Age of Onset  . Hypertension Father    . Migraines Father    . Asthma Brother          No formal diagnosis as of 06/13/2012  . Diabetes Maternal Grandmother    . Hypertension Maternal Grandmother    . Hypertension Paternal Grandmother    . Fibroids Other          Uncertain which family member(s)    Social History:  reports that she has never smoked. She has never used smokeless tobacco. She reports that she does not drink alcohol or use drugs. Allergies:      Allergies  Allergen Reactions  . Shellfish Allergy Nausea Only          Medications Prior to Admission  Medication Sig Dispense Refill  . acetaminophen (TYLENOL) 500 MG tablet Take 1,000 mg by mouth every 6 (six) hours as needed for  moderate pain or fever.      Marland Kitchen albuterol (VENTOLIN HFA) 108 (90 Base) MCG/ACT inhaler Inhale 2 puffs into the lungs every 6 (six) hours as needed for wheezing or shortness of breath.      . fluticasone (FLONASE) 50 MCG/ACT nasal spray Place 2 sprays into both nostrils daily. 16 g 6  . Phenyleph-Diphenhyd-DM-APAP ([employer] SEVERE COLD/FLU) Liquid LQPK Take 15 mLs by mouth 2 (two) times daily as needed (cold/flu symptoms).       . triamcinolone ointment (KENALOG) 0.1 % Apply 1 application topically 2 (two) times daily. (Patient taking differently: Apply 1 application topically daily. ) 80 g 0      Home: Home Living Family/patient expects to be discharged to:: Private residence Living Arrangements: Spouse/significant other Available Help at Discharge: Family, Available 24 hours/day Type of Home: Apartment Home Access: Stairs to enter [employer] of Steps: 14 Home Layout: One level Bathroom Shower/Tub: Tub/shower unit, Architectural technologist: Programmer, systems: Yes Home Equipment: None  Functional History: Prior Function Level of Independence: Independent Comments: Working at [employer];  Functional Status:  Mobility: Bed Mobility Overal bed mobility: Needs Assistance Bed Mobility: Rolling Rolling: Max assist, +2  for physical assistance General bed mobility comments: OOB via sara stedy by RN Transfers Overall transfer level: Needs assistance Equipment used: Rolling walker (2 wheeled) Transfer via Lift Equipment: Stedy Transfers: Sit to/from Stand Sit to Stand: Mod assist, Max assist, +2 physical assistance, +2 safety/equipment General transfer comment: stood x 2 at RW with  max assist from low seat, did not feel  legs would support to take a step. sara stedy placed  and patient assisted to stand , using STEDY  bar to pull up with  2 mod/max assist and bed pad to boost. Patient sat onto flaps to transfer to [facility]. Stood again in Tina stedy to return to [place],  decreased control of descent. due to low seat and standing on STEDY platform.   ADL: ADL Overall ADL's : Needs assistance/impaired Eating/Feeding: Minimal assistance, Sitting Eating/Feeding Details (indicate cue type and reason): Pt engaging with self feeding breakfast. Pt able to hold utensils. Min A for opening containers.  Grooming: Moderate assistance, Wash/dry face, Oral care Grooming Details (indicate cue type and reason): Able to bring toothbrush to mouth after set up but difficulty sustaining attention to task; left toothbrush handing in mouth and required tactile cues to complete; assist for sequencing Upper Body Bathing: Maximal assistance, Bed level Lower Body Bathing: Total assistance, Bed level Upper Body Dressing : Total assistance, Bed level Lower Body Dressing: Total assistance, Bed level Toilet Transfer: Moderate assistance, Maximal assistance, +2 for physical assistance, +2 for safety/equipment, BSC(sit<>stand with sara stedy) Toilet Transfer Details (indicate cue type and reason): Mod-Max A to power up into standing. Use of stedy to pull into standing and block bilateral knees Toileting- Clothing Manipulation and Hygiene: Maximal assistance, Sit to/from stand, +2 for physical assistance Toileting - Clothing Manipulation Details (indicate cue type and reason): Max A for peri care with second person for assist to maintain standing. Functional mobility during ADLs: Maximal assistance, +2 for physical assistance, Moderate assistance(sara stedy to sit<>stand) General ADL Comments: Pt performing sit<>stand with stedy to transfer to [facility] for possible BM. Pt engaging in sit<>stand 6 times.    Cognition: Cognition Overall Cognitive Status: Impaired/Different from baseline Orientation Level: Oriented X4 Cognition Arousal/Alertness: Awake/alert Behavior During Therapy: WFL for tasks assessed/performed Overall Cognitive Status: Impaired/Different from baseline Area of Impairment:  Attention, Memory, Following commands, Safety/judgement, Awareness, Problem solving Orientation Level: Disoriented to, Time Current Attention Level: Sustained Memory: Decreased short-term memory Following Commands: Follows one step commands consistently, Follows one step commands with increased time Safety/Judgement: Decreased awareness of deficits Awareness: Emergent Problem Solving: Slow processing, Requires verbal cues General Comments: Pt motivated to participate in therapy. Pt continues to present with decreased process and requires increased time for following commands. Pt with decreased attention and becoming distracted by environment and requiring cues to then return to ADL or mobility.    Blood pressure 124/75, pulse (!) 109, temperature 99.1 F (37.3 C), temperature source Oral, resp. rate (!) 21, height 5' 9"  (1.753 m), weight (!) 147 kg, SpO2 91 %.    Physical Exam General: Alert and oriented x 3, No apparent distress, morbidly obese HEENT: Head is normocephalic, atraumatic, PERRLA, EOMI, sclera anicteric, oral mucosa pink and moist, dentition intact, ext ear canals clear,  Neck: Supple without JVD or lymphadenopathy Heart: Reg rate and rhythm. No murmurs rubs or gallops Chest: CTA bilaterally without wheezes, rales, or rhonchi; no distress Abdomen: Soft, non-tender, non-distended, bowel sounds positive. Extremities: No clubbing, cyanosis, or edema. Pulses are 2+ Skin: Clean and intact without signs of breakdown  Neuro/Musculoskeletal: AOx3. Bilateral upper extremity tremor present with muscle testing. Motor function is grossly 5/5.  Psych: Pt's affect is appropriate. Pt is cooperative   Lab Results Last 24 Hours       Results for orders placed or performed during the hospital encounter of 01/26/19 (from the past 24 hour(s))  Glucose, capillary     Status: Abnormal    Collection Time: 02/17/19  3:49 PM  Result Value Ref Range    Glucose-Capillary 149 (H) 70 - 99 mg/dL    Glucose, capillary     Status: Abnormal    Collection Time: 02/17/19  7:29 PM  Result Value Ref Range    Glucose-Capillary 175 (H) 70 - 99 mg/dL  C-reactive protein     Status: Abnormal    Collection Time: 02/18/19 12:31 AM  Result Value Ref Range    CRP 5.8 (H) <1.0 mg/dL  Glucose, capillary     Status: Abnormal    Collection Time: 02/18/19 12:48 AM  Result Value Ref Range    Glucose-Capillary 120 (H) 70 - 99 mg/dL  Glucose, capillary     Status: Abnormal    Collection Time: 02/18/19  4:34 AM  Result Value Ref Range    Glucose-Capillary 116 (H) 70 - 99 mg/dL  Glucose, capillary     Status: Abnormal    Collection Time: 02/18/19  8:12 AM  Result Value Ref Range    Glucose-Capillary 126 (H) 70 - 99 mg/dL  Glucose, capillary     Status: Abnormal    Collection Time: 02/18/19 11:47 AM  Result Value Ref Range    Glucose-Capillary 162 (H) 70 - 99 mg/dL      Imaging Results (Last 48 hours)  No results found.       Assessment/Plan: Diagnosis: Impaired mobility and ADLs 1. Does the need for close, 24 hr/day medical supervision in concert with the patient's rehab needs make it unreasonable for this patient to be served in a less intensive setting? Yes 2. Co-Morbidities requiring supervision/potential complications: chronic back pain, morbid obesity, eczema, seasonal allergies, sleep apnea, DM2 3. Due to bladder management, bowel management, safety, skin/wound care, disease management, medication administration, pain management and patient education, does the patient require 24 hr/day rehab nursing? Yes 4. Does the patient require coordinated care of a physician, rehab nurse, therapy disciplines of PT, OT to address physical and functional deficits in the context of the above medical diagnosis(es)? Yes Addressing deficits in the following areas: balance, endurance, locomotion, strength, transferring, bowel/bladder control, bathing, dressing, feeding, grooming, toileting and psychosocial  support 5. Can the patient actively participate in an intensive therapy program of at least 3 hrs of therapy per day at least 5 days per week? Yes 6. The potential for patient to make measurable gains while on inpatient rehab is good 7. Anticipated functional outcomes upon discharge from inpatient rehab are min assist  with PT, min assist with OT, independent with SLP. 8. Estimated rehab length of stay to reach the above functional goals is: 2-3 weeks 9. Anticipated discharge destination: Home 10. Overall Rehab/Functional Prognosis: good   RECOMMENDATIONS: This patient's condition is appropriate for continued rehabilitative care in the following setting: CIR Patient has agreed to participate in recommended program. Yes Note that insurance prior authorization may be required for reimbursement for recommended care.   Comment: Mrs. Scannell would be an excellent rehabilitation candidate. Admission to CIR currently pending insurance authorization, will likely be tomorrow morning. In acute therapy today, would benefit from working on balance and  proprioception due to deficits from bilateral lower extremity neuropathy. Recommend Gabapentin 368m TID for neuropathic pain control.   Thank you for this consult. I will continue to follow in Ms. Astacio's care.    KIzora Ribas MD 02/18/2019    Revision History                     Routing History

## 2019-02-20 NOTE — Plan of Care (Signed)
  Problem: Consults Goal: RH GENERAL PATIENT EDUCATION Description: See Patient Education module for education specifics. Outcome: Progressing   Problem: RH BOWEL ELIMINATION Goal: RH STG MANAGE BOWEL WITH ASSISTANCE Description: STG Manage Bowel with Min Assistance. Outcome: Progressing   Problem: RH BLADDER ELIMINATION Goal: RH STG MANAGE BLADDER WITH ASSISTANCE Description: STG Manage Bladder With Min Assistance Outcome: Progressing Goal: RH STG MANAGE BLADDER WITH MEDICATION WITH ASSISTANCE Description: STG Manage Bladder With Medication With [place]. Outcome: Progressing   Problem: RH SKIN INTEGRITY Goal: RH STG SKIN FREE OF INFECTION/BREAKDOWN Description: No new breakdown with min assist  Outcome: Progressing Goal: RH STG ABLE TO PERFORM INCISION/WOUND CARE W/ASSISTANCE Description: STG Able To Perform Incision/Wound Care With [employer]. Outcome: Progressing   Problem: RH PAIN MANAGEMENT Goal: RH STG PAIN MANAGED AT OR BELOW PT'S PAIN GOAL Description: < 3 out of 10.  Outcome: Progressing

## 2019-02-20 NOTE — Progress Notes (Signed)
Patient ID: Anita Hunter, female   DOB: [birth year], 29 y.o.   MRN: [id number] Admitted to room 4 M13. Pt was oriented to unit. Reviewed medications and history with Pt. No concerns to report at this time. Amanda Cockayne, LPN

## 2019-02-20 NOTE — Plan of Care (Signed)
  Problem: Education: Goal: Knowledge of General Education information will improve Description: Including pain rating scale, medication(s)/side effects and non-pharmacologic comfort measures Outcome: Progressing   Problem: Health Behavior/Discharge Planning: Goal: Ability to manage health-related needs will improve Outcome: Progressing   Problem: Clinical Measurements: Goal: Ability to maintain clinical measurements within normal limits will improve Outcome: Progressing Goal: Will remain free from infection Outcome: Progressing Goal: Diagnostic test results will improve Outcome: Progressing Goal: Respiratory complications will improve Outcome: Progressing Goal: Cardiovascular complication will be avoided Outcome: Progressing   Problem: Activity: Goal: Risk for activity intolerance will decrease Outcome: Progressing   Problem: Nutrition: Goal: Adequate nutrition will be maintained Outcome: Progressing   Problem: Coping: Goal: Level of anxiety will decrease Outcome: Progressing   Problem: Elimination: Goal: Will not experience complications related to bowel motility Outcome: Progressing Goal: Will not experience complications related to urinary retention Outcome: Progressing   Problem: Pain Managment: Goal: General experience of comfort will improve Outcome: Progressing   Problem: Safety: Goal: Ability to remain free from injury will improve Outcome: Progressing   Problem: Skin Integrity: Goal: Risk for impaired skin integrity will decrease Outcome: Progressing   Problem: Respiratory: Goal: Will maintain a patent airway Outcome: Progressing Goal: Complications related to the disease process, condition or treatment will be avoided or minimized Outcome: Progressing   Problem: Respiratory: Goal: Ability to maintain a clear airway and adequate ventilation will improve Outcome: Progressing

## 2019-02-20 NOTE — Progress Notes (Signed)
Anita Ribas, MD  Physician  Physical Medicine and Rehabilitation  PMR Pre-admission  Signed  Date of Service:  02/18/2019  1:57 PM      Related encounter: ED to Hosp-Admission (Discharged) from 01/26/2019 in [facility]      Signed        PMR Admission Coordinator Pre-Admission Assessment   Patient: Anita Hunter is an 29 y.o., female MRN: [id number] DOB: Nov 16, 1990 Height: 5' 9"  (175.3 cm) Weight: (!) 147 kg                                                                                                                                                  Insurance Information HMO:     PPO: yes     PCP:      IPA:      80/20:      OTHER:  PRIMARY: [employer] [place]      Policy#: [id number]      Subscriber: Patient CM Name: Janann August      Phone#: [phone]     Fax#: [phone] Pre-Cert#: [id number]      Employer:  Josem Kaufmann ([id number]) provided by Janann August for admit to CIR with admit date 1/8. Pt is approved for 7 days through 1/14. Clinical updates are due 1/15. (reference call number: [id number]). Clinical updates due to (f): [phone] -need reference number on fax sheet.  Benefits:  Phone #: [phone], options 1 for English, 2, 9, 2     Name: ref # [id number] Eff. Date: 02/13/2019 - 02/12/2020     Deduct: $3,000 ($477.82 met)      Out of Pocket Max: $6,650 (includes - $477.82 met)      Life Max: NA CIR: 75% coverage, 25% co-insurance      SNF: 75% coverage, 25% co-insurance; limited by diagnosis and case review Outpatient: 75% coverage, 25% co-insurance; limited 20 visits combined PT/OT/ST     Home Health: 75% coverage, 25% co-insurance; limited to 100 visits per service year     DME: 75% coverage, 25% co-insurance Providers:  SECONDARY: None      Policy#:       Subscriber:  CM Name:       Phone#:      Fax#:  Pre-Cert#:       Employer:  Benefits:  Phone #:      Name:  Eff. Date:      Deduct:       Out of Pocket Max:        Life Max:  CIR:       SNF:  Outpatient:      Co-Pay:  Home Health:       Co-Pay:  DME:      Co-Pay:    Medicaid Application Date:       Case Manager:  Disability Application Date:  Case Worker:    The "Data Collection Information Summary" for patients in Inpatient Rehabilitation Facilities with attached "Privacy Act [place] Records" was provided and verbally reviewed with: N/A   Emergency Contact Information         Contact Information     Name Relation Home Work Mobile    [place] D Mother [place] Sister [phone]        [place] Myrtle [phone]        Arena, Lindahl Father     [phone]       Current Medical History  Patient Admitting Diagnosis: Post-Covid-19 Debility   History of Present Illness: [name] is a 29 year old female with history of chronic back pain, morbid obesity with BMI of 47.85, eczema, sleep apnea, type 2 diabetes mellitus. Pt presented 01/26/2019 with progressive shortness of breath x2 days with sinus congestion, fatigue, malaise, low-grade fever and chills.  She had a history of exposure to positive COVID-19 2 days before the onset of her symptoms.  In the ED SARS coronavirus positive, glucose 257, hemoglobin A1c 11.1, D-dimer 0.53.  Chest x-ray no active disease however follow-up chest x-ray showed development of bilateral heterogeneous lung opacities consistent with COVID-19 pneumonia.  Patient did require high flow nasal cannula oxygen therapy.  She was placed on prednisone therapy and tapered off as well as antivirals.  Blood culture showing no growth to date.  Patient has been maintained on BiPAP. Bouts of hypokalemia and placed on supplement.  Hospital course complicated by E. coli UTI again antibiotic treatment has been completed.  Patient was treated for acute systolic congestive heart failure maintained on Lasix monitoring hydration.  She is tolerating a regular diet.  Maintain  on Lovenox for DVT prophylaxis with venous Doppler studies negative.WOC consult for left ankle wound advised hydrocolloid dressing daily.  Therapy evaluations completed with recommendations of physical medicine rehab consult and patient is to admit to CIR on 02/20/19.    Glasgow Coma Scale Score: 15   Past Medical History      Past Medical History:  Diagnosis Date  . Back pain    . Eczema    . Seasonal allergies    . Sleep apnea    . Type 2 diabetes mellitus ([place]) 01/26/2019      Family History  family history includes Asthma in her brother; Diabetes in her maternal grandmother; Fibroids in an other family member; Hypertension in her father, maternal grandmother, and paternal grandmother; Migraines in her father.   Prior Rehab/Hospitalizations:  Has the patient had prior rehab or hospitalizations prior to admission? No   Has the patient had major surgery during 100 days prior to admission? No   Current Medications    Current Facility-Administered Medications:  .  0.9 %  sodium chloride infusion, , Intravenous, PRN, [name], MD, Last Rate: 10 mL/hr at 02/13/19 1800, Rate Verify at 02/13/19 1800 .  0.9 %  sodium chloride infusion, , Intravenous, PRN, [name], MD, Last Rate: 10 mL/hr at 02/13/19 1800, Rate Verify at 02/13/19 1800 .  [DISCONTINUED] acetaminophen (TYLENOL) tablet 650 mg, 650 mg, Oral, Q4H PRN, 650 mg at 01/31/19 0800 **OR** acetaminophen (TYLENOL) suppository 650 mg, 650 mg, Rectal, Q4H PRN, [name], MD, 650 mg at 02/06/19 0850 .  acetaminophen (TYLENOL) tablet 650 mg, 650 mg, Oral, Q6H PRN, [name], MD, 650 mg at 02/20/19 0021 .  chlorhexidine (PERIDEX) 0.12 % solution 15 mL, 15 mL,  Mouth/Throat, BID, [name], MD, 15 mL at 02/19/19 2136 .  Chlorhexidine Gluconate Cloth 2 % PADS 6 each, 6 each, Topical, Daily, [name], MD, 6 each at 02/20/19 [phone] .  docusate (COLACE) 50 MG/5ML liquid 100 mg, 100 mg, Oral, BID  PRN, [name], MD, 100 mg at 02/12/19 2123 .  enoxaparin (LOVENOX) injection 70 mg, 70 mg, Subcutaneous, Q24H, Joselyn Glassman A, RPH, 70 mg at 02/20/19 0948 .  famotidine (PEPCID) tablet 20 mg, 20 mg, Oral, Daily, [name], MD, 20 mg at 02/20/19 0948 .  feeding supplement (ENSURE ENLIVE) (ENSURE ENLIVE) liquid 237 mL, 237 mL, Oral, TID BM, [name], MD, 237 mL at 02/19/19 2135 .  feeding supplement (PRO-STAT SUGAR FREE 64) liquid 30 mL, 30 mL, Oral, TID WC, [name], MD, 30 mL at 02/19/19 0845 .  furosemide (LASIX) tablet 40 mg, 40 mg, Oral, Daily, Barb Merino, MD, 40 mg at 02/20/19 0951 .  gabapentin (NEURONTIN) capsule 200 mg, 200 mg, Oral, TID, Barb Merino, MD, 200 mg at 02/20/19 0948 .  insulin aspart (novoLOG) injection 0-20 Units, 0-20 Units, Subcutaneous, Q4H, [name], MD, 3 Units at 02/20/19 0950 .  insulin aspart (novoLOG) injection 6 Units, 6 Units, Subcutaneous, TID WC, [name], MD, 6 Units at 02/19/19 1200 .  insulin detemir (LEVEMIR) injection 35 Units, 35 Units, Subcutaneous, BID, [name], MD, 35 Units at 02/20/19 [phone] .  insulin starter kit- pen needles (English) 1 kit, 1 kit, Other, Once, [place], Trenton Gammon, MD .  Ipratropium-Albuterol (COMBIVENT) respimat 2 puff, 2 puff, Inhalation, Q6H PRN, [name], MD .  LORazepam (ATIVAN) injection 1 mg, 1 mg, Intravenous, Q4H PRN, [name], MD, 1 mg at 02/13/19 2256 .  magnesium hydroxide (MILK OF MAGNESIA) suspension 15 mL, 15 mL, Oral, Daily, [name], MD, 15 mL at 02/19/19 0845 .  magnesium oxide (MAG-OX) tablet 400 mg, 400 mg, Oral, BID, Barb Merino, MD, 400 mg at 02/20/19 0951 .  multivitamin with minerals tablet 1 tablet, 1 tablet, Oral, Daily, [name], MD, 1 tablet at 02/20/19 0949 .  pantoprazole (PROTONIX) EC tablet 40 mg, 40 mg, Oral, Daily, [name], MD, 40 mg at 02/20/19 0951 .  potassium chloride SA  (KLOR-CON) CR tablet 40 mEq, 40 mEq, Oral, BID, Barb Merino, MD, 40 mEq at 02/20/19 0949 .  sodium chloride flush (NS) 0.9 % injection 10-40 mL, 10-40 mL, Intracatheter, Q12H, [name], MD, 10 mL at 02/19/19 0903 .  sodium chloride flush (NS) 0.9 % injection 10-40 mL, 10-40 mL, Intracatheter, PRN, [name], MD   Patients Current Diet:     Diet Order                      Diet Carb Modified           Diet regular Room service appropriate? Yes; Fluid consistency: Thin  Diet effective now                   Precautions / Restrictions Precautions Precautions: Fall Precaution Comments: watch O2, and HR, legs very weak, can stand but not take a step yet Restrictions Weight Bearing Restrictions: No    Has the patient had 2 or more falls or a fall with injury in the past year?No   Prior Activity Level Community (5-7x/wk): worked at [employer] No AD, Independent prior. Lived with friend and cousin  in apartment at baseline.    Prior Functional Level Prior Function Level of Independence: Independent Comments: Working at [employer];    [place]: Did the patient need help bathing, dressing, using the toilet or eating?  Independent   Indoor Mobility: Did the patient need assistance with walking from room to room (with or without device)? Independent   Stairs: Did the patient need assistance with internal or external stairs (with or without device)? Independent   Functional Cognition: Did the patient need help planning regular tasks such as shopping or remembering to take medications? Independent   Home Assistive Devices / Equipment Home Assistive Devices/Equipment: None Home Equipment: None   Prior Device Use: Indicate devices/aids used by the patient prior to current illness, exacerbation or injury? None of the above   Current Functional Level Cognition   Overall Cognitive Status: No family/caregiver present to determine baseline cognitive  functioning Current Attention Level: Sustained Orientation Level: Oriented X4 Following Commands: Follows one step commands consistently, Follows one step commands with increased time Safety/Judgement: Decreased awareness of deficits General Comments: initially not very talkertive but became more as tx progressed, seems to be getting better cognitively    Extremity Assessment (includes Sensation/Coordination)   Upper Extremity Assessment: Generalized weakness RUE Deficits / Details: generalized weakness; AROM grossly WFL with 3/5 strength throughtout but difficulty coordinating movement and bringing hand to mouth - some difficulty with this due to bed positioning and body habitus; difficulty with inhand manipulation skills making managing utneils, toothbrush, etc more difficult RUE Coordination: decreased fine motor, decreased gross motor LUE Deficits / Details: overall weaker than R; Increased weakness L shoulder; only @ 0-30 degrees active shoulder flexion; unable to place and hold shoulder @ 90, however also difficulty sustaining attention to task; increased difficulty wiht fine motor and in-hand manipulation skills; unable to bring hand to mouth due to weakness LUE Coordination: decreased fine motor, decreased gross motor  Lower Extremity Assessment: Defer to PT evaluation RLE Deficits / Details: 1/5 hip flexion, 1+/5 knee extension, abd. reports foot feels numb, appreciates LT, did not fully test due to MS, dorsiflex is 1 LLE Deficits / Details: hip flex 2/5. knee ext 2/5.  Abd 2/5, relates some numbness but not as much as right, dorsiflex/plantar is 2+     ADLs   Overall ADL's : Needs assistance/impaired Eating/Feeding: Minimal assistance, Sitting Eating/Feeding Details (indicate cue type and reason): Pt engaging with self feeding breakfast. Pt able to hold utensils. Min A for opening containers.  Grooming: Moderate assistance, Wash/dry face, Oral care Grooming Details (indicate cue type  and reason): Able to bring toothbrush to mouth after set up but difficulty sustaining attention to task; left toothbrush handing in mouth and required tactile cues to complete; assist for sequencing Upper Body Bathing: Maximal assistance, Bed level Lower Body Bathing: Total assistance, Bed level Upper Body Dressing : Total assistance, Bed level Lower Body Dressing: Total assistance, Bed level Toilet Transfer: Moderate assistance, Maximal assistance, +2 for physical assistance, +2 for safety/equipment, BSC(sit<>stand with sara stedy) Toilet Transfer Details (indicate cue type and reason): Mod-Max A to power up into standing. Use of stedy to pull into standing and block bilateral knees Toileting- Clothing Manipulation and Hygiene: Maximal assistance, Sit to/from stand, +2 for physical assistance Toileting - Clothing Manipulation Details (indicate cue type and reason): Max A for peri care with second person for assist to maintain standing. Functional mobility during ADLs: Maximal assistance, +2 for physical assistance, Moderate assistance(sara stedy to sit<>stand) General ADL Comments: Pt  performing sit<>stand with stedy to transfer to [facility] for possible BM. Pt engaging in sit<>stand 6 times.      Mobility   Overal bed mobility: Needs Assistance Bed Mobility: Rolling, Supine to Sit Rolling: Min assist Supine to sit: Mod assist General bed mobility comments: OOB via sara stedy by RN     Transfers   Overall transfer level: Needs assistance Equipment used: Rolling walker (2 wheeled) Transfer via Lift Equipment: Stedy Transfers: Sit to/from Stand, [employer] Transfers Sit to Stand: Mod assist Stand pivot transfers: Mod assist, +2 physical assistance General transfer comment: completed sit<>stand x 5 at EOB w/ cues and RW, then able to take some small pivotal steps to recliner     Ambulation / Gait / Stairs / Wheelchair Mobility   Ambulation/Gait General Gait Details: will attempt at next  session     Posture / Balance Dynamic Sitting Balance Sitting balance - Comments: sits eob w/o LOB Balance Overall balance assessment: Needs assistance Sitting-balance support: Feet supported, Bilateral upper extremity supported Sitting balance-Leahy Scale: Fair Sitting balance - Comments: sits eob w/o LOB Postural control: Left lateral lean Standing balance support: During functional activity, Bilateral upper extremity supported Standing balance-Leahy Scale: Fair Standing balance comment: stands w/ RW and maintains with min guard to min a     Special needs/care consideration BiPAP/CPAP: pt has CPAP at home but doesn't use if frequently CPM: no Continuous Drip IV: no Dialysis: no        Days: no Life Vest: no Oxygen: 3L  Special Bed: no Trach Size: no Wound Vac (area): no      Location: no Skin: left posterior back dehisced location, MASD medial, right, left sacrum, skin tear to right, lateral leg.                   Bowel mgmt: last BM 02/20/19, continent Bladder mgmt: continent Diabetic mgmt: yes Behavioral consideration : no Chemo/radiation : no        Previous Home Environment (from acute therapy documentation) Living Arrangements: Spouse/significant other Available Help at Discharge: Family, Available 24 hours/day Type of Home: Apartment Home Layout: One level Home Access: Stairs to enter [employer] of Steps: 14 Bathroom Shower/Tub: Public librarian, Architectural technologist: Programmer, systems: Yes How Accessible: Accessible via walker [place]: No   Discharge Living Setting Plans for Discharge Living Setting: Other (Comment)(plan is to go to pt's parents apartment at DC) Type of Home at Discharge: Apartment Discharge Home Layout: One level Discharge Home Access: Stairs to enter Entrance Stairs-Rails: Right Entrance Stairs-Number of Steps: 6-8 (as apartment is on the 2nd level) Discharge Bathroom Shower/Tub: Tub/shower  unit Discharge Bathroom Toilet: Standard Discharge Bathroom Accessibility: Yes How Accessible: Accessible via walker Does the patient have any problems obtaining your medications?: No   Social/Family/Support Systems Patient Roles: Other (Comment)(employee at [employer]. Lives with cousin and friend) Sport and exercise psychologist Information: Azzie Thiem (mother): [phone]; Delfin Edis (sister): [phone] Anticipated Caregiver: parents, has supportive sister Delfin Edis) Anticipated Caregiver's Contact Information: see above Ability/Limitations of Caregiver: Min A Caregiver Availability: 24/7 (mother and father will take turns staying with her).  Discharge Plan Discussed with Primary Caregiver: Yes Is Caregiver In Agreement with Plan?: Yes Does Caregiver/Family have Issues with Lodging/Transportation while Pt is in Rehab?: No     Goals/Additional Needs Patient/Family Goal for Rehab: PT/OT: Supervision/Min A; SLP: NA Expected length of stay: 21-25 days  Cultural Considerations: NA Dietary Needs: regular diet, thin liquids Equipment Needs: Stedy, [place] possibly Pt/Family Agrees to Admission  and willing to participate: Yes Program Orientation Provided & Reviewed with Pt/Caregiver Including Roles  & Responsibilities: Yes(pt, her sister, and her mom)  Barriers to Discharge: Medical stability, Home environment access/layout, Insurance for SNF coverage, New oxygen  Barriers to Discharge Comments: steps to enter, new O2 needs.      Decrease burden of Care through IP rehab admission: NA     Possible need for SNF placement upon discharge: Not anticipated. Pt has great family support at DC with someone who is able to provide physical assist 24/7. Pt will need to negotiate stairs to enter her parents apartment but anticipate that is within reach after CIR stay. Pt is highly motivated and understands the plan is to DC home from IP Rehab stay.      Patient Condition: This patient's medical and functional status  has changed since the consult dated: 02/19/19 in which the Rehabilitation Physician determined and documented that the patient's condition is appropriate for intensive rehabilitative care in an inpatient rehabilitation facility. Medical changes are: pt with peripheral neuropathy with pt started on gabapentin; see HPI for details.  Functional changes [id number] in functional transfers from Mod A + 2, Max A + 2 for sit to stand and use of stedy to Mod A for sit to stand with RW and Mod A +2 for stand pivot.  . After evaluating the patient today and speaking with the Rehabilitation physician and acute team, the patient remains appropriate for inpatient rehab. Will admit to inpatient rehab 02/20/19.   Preadmission Screen Completed By:  Raechel Ache, OT, 02/20/2019 11:27 AM ______________________________________________________________________   Discussed status with Dr. Ranell Patrick on 1/8/21at 11:27AM and received approval for admission today.   Admission Coordinator:  Raechel Ache, time 11:27AM/Date 02/20/19         Revision History Date/Time User Provider Type Action  02/20/2019 11:31 AM Raulkar, Clide Deutscher, MD Physician Sign  02/20/2019 11:28 AM Raechel Ache, OT Rehab Admission Coordinator Share  02/20/2019 10:57 AM Ranell Patrick, Clide Deutscher, MD Physician Pend  02/19/2019  4:05 PM Raechel Ache, [place] Rehab Admission Coordinator [facility] Details Report

## 2019-02-20 NOTE — Progress Notes (Signed)
Patient ready for d/c. Patient belongings including wallet and keys are with patient. Spoke with sister Phineas Real. Report already given. No PIV. VSS no signs of respiratory distress.

## 2019-02-20 NOTE — H&P (Signed)
Physical Medicine and Rehabilitation Admission H&P  CC: Debility  HPI: Anita Hunter is a 29 year old right-handed female with history of chronic back pain, morbid obesity with BMI of 47.85, eczema, sleep apnea, type 2 diabetes mellitus.  Per chart review patient lives with spouse independent prior to admission.  She works at a [employer].  1 level home with multiple stairs to entry.  Presented 01/26/2019 with progressive shortness of breath x2 days with sinus congestion, fatigue, malaise, low-grade fever and chills.  She had a history of exposure to positive COVID-19 2 days before the onset of her symptoms.  In the ED SARS coronavirus positive, glucose 257, hemoglobin A1c 11.1, D-dimer 0.53.  Chest x-ray no active disease however follow-up chest x-ray showed development of bilateral heterogeneous lung opacities consistent with COVID-19 pneumonia.  Patient did require high flow nasal cannula oxygen therapy.  She was placed on prednisone therapy and tapered off as well as antivirals.  Blood culture showing no growth to date.  Patient has been maintained on BiPAP. Bouts of hypokalemia and placed on supplement.  Hospital course complicated by E. coli UTI again antibiotic treatment has been completed.  Patient was treated for acute systolic congestive heart failure maintained on Lasix monitoring hydration.  She is tolerating a regular diet.  Maintain on Lovenox for DVT prophylaxis with venous Doppler studies negative.WOC consult for left ankle wound advised hydrocolloid dressing daily.  Therapy evaluations completed with recommendations of physical medicine rehab consult and patient was admitted for a comprehensive rehab program.  Voided 400cc in bedside commode this morning.  Improved mobility with PT, now standing 30sec to 1 minute at a time. Still limited by painful neuropathy in BLE and L hand. Only required 1L [place] during session and saturations remained above 90. Mod A bed mobility.   Review of  Systems  Constitutional: Positive for chills, fever and malaise/fatigue.  HENT: Positive for congestion and sinus pain. Negative for hearing loss.   Eyes: Negative for blurred vision and double vision.  Respiratory: Positive for cough and shortness of breath.   Cardiovascular: Positive for leg swelling. Negative for chest pain and palpitations.  Gastrointestinal: Positive for constipation. Negative for heartburn, nausea and vomiting.  Genitourinary: Negative for dysuria and hematuria.  Musculoskeletal: Positive for back pain and myalgias.  Skin: Negative for rash.  All other systems reviewed and are negative.  Past Medical History:  Diagnosis Date  . Back pain   . Eczema   . Seasonal allergies   . Sleep apnea   . Type 2 diabetes mellitus ([place]) 01/26/2019   Past Surgical History:  Procedure Laterality Date  . NO PAST SURGERIES     Family History  Problem Relation Age of Onset  . Hypertension Father   . Migraines Father   . Asthma Brother        No formal diagnosis as of 06/13/2012  . Diabetes Maternal Grandmother   . Hypertension Maternal Grandmother   . Hypertension Paternal Grandmother   . Fibroids Other        Uncertain which family member(s)   Social History:  reports that she has never smoked. She has never used smokeless tobacco. She reports that she does not drink alcohol or use drugs. Allergies:  Allergies  Allergen Reactions  . Shellfish Allergy Nausea Only   Medications Prior to Admission  Medication Sig Dispense Refill  . acetaminophen (TYLENOL) 500 MG tablet Take 1,000 mg by mouth every 6 (six) hours as needed for moderate pain or  fever.    . albuterol (VENTOLIN HFA) 108 (90 Base) MCG/ACT inhaler Inhale 2 puffs into the lungs every 6 (six) hours as needed for wheezing or shortness of breath.    . fluticasone (FLONASE) 50 MCG/ACT nasal spray Place 2 sprays into both nostrils daily. 16 g 6  . [START ON 02/21/2019] furosemide (LASIX) 40 MG tablet Take 1 tablet (40  mg total) by mouth daily. 30 tablet   . gabapentin (NEURONTIN) 100 MG capsule Take 2 capsules (200 mg total) by mouth 3 (three) times daily.    . insulin aspart (NOVOLOG) 100 UNIT/ML injection Inject 6 Units into the skin 3 (three) times daily with meals. 10 mL 11  . insulin aspart (NOVOLOG) 100 UNIT/ML injection Inject 0-20 Units into the skin every 4 (four) hours. 10 mL 11  . insulin detemir (LEVEMIR) 100 UNIT/ML injection Inject 0.35 mLs (35 Units total) into the skin 2 (two) times daily. 10 mL 11  . magnesium oxide (MAG-OX) 400 (241.3 Mg) MG tablet Take 1 tablet (400 mg total) by mouth 2 (two) times daily.    Derrill Memo ON 02/21/2019] Multiple Vitamin (MULTIVITAMIN WITH MINERALS) TABS tablet Take 1 tablet by mouth daily.    Derrill Memo ON 02/21/2019] pantoprazole (PROTONIX) 40 MG tablet Take 1 tablet (40 mg total) by mouth daily.    . potassium chloride SA (KLOR-CON) 20 MEQ tablet Take 2 tablets (40 mEq total) by mouth daily.    . traMADol (ULTRAM) 50 MG tablet Take 1 tablet (50 mg total) by mouth every 6 (six) hours as needed for up to 3 days for moderate pain. 30 tablet     Drug Regimen Review Drug regimen was reviewed and remains appropriate with no significant issues identified  Home: Home Living Family/patient expects to be discharged to:: Private residence Living Arrangements: Spouse/significant other Available Help at Discharge: Family, Available 24 hours/day Type of Home: Apartment Home Access: Stairs to enter [employer] of Steps: [place]: One level Bathroom Shower/Tub: Tub/shower unit, Architectural technologist: Programmer, systems: Yes Home Equipment: None   Functional History: Prior Function Level of Independence: Independent Comments: Working at [employer];   Functional Status:  Mobility: Bed Mobility Overal bed mobility: Needs Assistance Bed Mobility: Rolling, Supine to Sit Rolling: Min assist Supine to sit: Mod assist General bed mobility  comments: OOB via [place] by RN Transfers Overall transfer level: Needs assistance Equipment used: Rolling walker (2 wheeled) Transfer via [place]: Stedy Transfers: Sit to/from Stand, [employer] Transfers Sit to Stand: Mod assist Stand pivot transfers: Mod assist, +2 physical assistance General transfer comment: completed sit<>stand x 5 at EOB w/ cues and RW, then able to take some small pivotal steps to recliner Ambulation/Gait General Gait Details: will attempt at next session  ADL: ADL Overall ADL's : Needs assistance/impaired Eating/Feeding: Minimal assistance, Sitting Eating/Feeding Details (indicate cue type and reason): Pt engaging with self feeding breakfast. Pt able to hold utensils. Min A for opening containers.  Grooming: Moderate assistance, Wash/dry face, Oral care Grooming Details (indicate cue type and reason): Able to bring toothbrush to mouth after set up but difficulty sustaining attention to task; left toothbrush handing in mouth and required tactile cues to complete; assist for sequencing Upper Body Bathing: Maximal assistance, Bed level Lower Body Bathing: Total assistance, Bed level Upper Body Dressing : Total assistance, Bed level Lower Body Dressing: Total assistance, Bed level Toilet Transfer: Moderate assistance, Maximal assistance, +2 for physical assistance, +2 for safety/equipment, BSC(sit<>stand with sara  stedy) Toilet Transfer Details (indicate cue type and reason): Mod-Max A to power up into standing. Use of stedy to pull into standing and block bilateral knees Toileting- Clothing Manipulation and Hygiene: Maximal assistance, Sit to/from stand, +2 for physical assistance Toileting - Clothing Manipulation Details (indicate cue type and reason): Max A for peri care with second person for assist to maintain standing. Functional mobility during ADLs: Maximal assistance, +2 for physical assistance, Moderate assistance(sara stedy to  sit<>stand) General ADL Comments: Pt performing sit<>stand with stedy to transfer to [facility] for possible BM. Pt engaging in sit<>stand 6 times.   Cognition: Cognition Overall Cognitive Status: No family/caregiver present to determine baseline cognitive functioning Orientation Level: Oriented X4 Cognition Arousal/Alertness: Awake/alert Behavior During Therapy: WFL for tasks assessed/performed Overall Cognitive Status: No family/caregiver present to determine baseline cognitive functioning Area of Impairment: Attention, Memory, Following commands, Safety/judgement, Awareness, Problem solving Orientation Level: Disoriented to, Time Current Attention Level: Sustained Memory: Decreased short-term memory Following Commands: Follows one step commands consistently, Follows one step commands with increased time Safety/Judgement: Decreased awareness of deficits Awareness: Emergent Problem Solving: Slow processing, Requires verbal cues General Comments: initially not very talkative  Physical Exam: Blood pressure 132/68, pulse (!) 106, temperature 98.2 F (36.8 C), temperature source Oral, resp. rate 20, SpO2 90 %.  General: Alert and oriented x 3, No apparent distress, morbidly obese HEENT: Head is normocephalic, atraumatic, PERRLA, EOMI, sclera anicteric, oral mucosa pink and moist, dentition intact, ext ear canals clear,  Neck: Supple without JVD or lymphadenopathy Heart: Reg rate and rhythm. No murmurs rubs or gallops Chest: CTA bilaterally without wheezes, rales, or rhonchi; no distress Abdomen: Soft, non-tender, non-distended, bowel sounds positive. Extremities: No clubbing, cyanosis, or edema. Pulses are 2+ Skin: Clean and intact without signs of breakdown Neuro/Musculoskeletal: AOx3. Bilateral upper extremity tremor present with muscle testing. Motor function is grossly 4+/5. She has decreased sensation on the dorsum of her bilateral feet, worse on her left foot.   Psych: Pt's affect is  appropriate. Pt is cooperative  Results for orders placed or performed during the hospital encounter of 02/20/19 (from the past 48 hour(s))  Glucose, capillary     Status: None   Collection Time: 02/20/19  5:10 PM  Result Value Ref Range   Glucose-Capillary 75 70 - 99 mg/dL  CBC     Status: Abnormal   Collection Time: 02/20/19  6:18 PM  Result Value Ref Range   WBC 6.7 4.0 - 10.5 K/uL   RBC 4.24 3.87 - 5.11 MIL/uL   Hemoglobin 11.8 (L) 12.0 - 15.0 g/dL   HCT 37.6 36.0 - 46.0 %   MCV 88.7 80.0 - 100.0 fL   MCH 27.8 26.0 - 34.0 pg   MCHC 31.4 30.0 - 36.0 g/dL   RDW 14.6 11.5 - 15.5 %   Platelets 332 150 - 400 K/uL   nRBC 0.0 0.0 - 0.2 %    Comment: Performed at [facility], [place] [address] [place], [place] [address]  Creatinine, serum     Status: None   Collection Time: 02/20/19  6:18 PM  Result Value Ref Range   Creatinine, Ser 0.67 0.44 - 1.00 mg/dL   GFR calc non Af Amer >60 >60 mL/min   GFR calc Af Amer >60 >60 mL/min    Comment: Performed at [facility] [address], [place], [place] [address]   No results found.     Medical Problem List and Plan: 1.  Debility secondary to  acute on chronic respiratory failure with hypoxia related to COVID-19  -patient may shower  -ELOS/Goals: 14-18 days, ModI PT, OT, I in SLP 2.  Antithrombotics: -DVT/anticoagulation: Lovenox.  Doppler studies negative  -antiplatelet therapy: N/A 3. Pain Management/chronic back pain:   Neuropathic pain is severe, constant, and inhibiting her participation in PT and ability to upright in chair during the day. Recommend increasing Gabapentin dose to 311m TID with continued uptitration for better pain control. Can consider higher dose at night as she is not sleeping well.  4. Mood: Provide emotional support  -antipsychotic agents: N/A 5. Neuropsych: This patient is capable of making decisions on her own behalf. 6. Skin/Wound Care/left ankle wound: Hydrocolloid dressing left ankle  daily 7. Fluids/Electrolytes/Nutrition: Routine in and outs with follow-up chemistries 8.  Diabetes mellitus.  Hemoglobin A1c 11.1.  NovoLog 6 units 3 times daily, Levemir 35 units twice daily.  Diabetic teaching 9.  Acute systolic congestive heart failure.  Continue Lasix 40 mg daily.  Monitor for any signs of fluid overload 10.  Morbid obesity.  BMI 47.85.  Dietary follow-up. 11. Education: educated regarding the importance of using the incentive spirometer hourly. Patient is currently achieving 1250. Recommended that she monitor her progress over time.   DLavon PaganiniAngiulli, PA-C 02/20/2019   I have personally performed a face to face diagnostic evaluation, including, but not limited to relevant history and physical exam findings, of this patient and developed relevant assessment and plan.  Additionally, I have reviewed and concur with the physician assistant's documentation above.  KLeeroy Cha MD

## 2019-02-20 NOTE — Progress Notes (Signed)
Inpatient Rehabilitation-Admissions Coordinator   I have received medical clearance from Dr. Sloan Leiter for admit to CIR today. Spoke with pt and her family who are on board with pursuing CIR at this time. Consent form and insurance benefits letter reviewed. RN aware and AC will notify TOC team.   [facility] notified of transfer request. At this time, there is no estimated time for pickup due to overflow of calls.   Please call if questions.   Raechel Ache, OTR/L  Rehab Admissions Coordinator  [phone] 02/20/2019 11:18 AM

## 2019-02-20 NOTE — Evaluation (Signed)
Occupational Therapy Assessment and Plan  Patient Details  Name: Anita Hunter MRN: [id number] Date of Birth: 18-Jan-1991  OT Diagnosis: acute pain, muscle weakness (generalized) and decreased endurance Rehab Potential: Rehab Potential (ACUTE ONLY): Excellent ELOS: 7-10 days   Today's Date: 02/21/2019 OT Individual Time: 0710-0805 OT Individual Time Calculation (min): 55 min     Problem List:  Patient Active Problem List   Diagnosis Date Noted  . Debility 02/20/2019  . Pressure injury of skin 02/20/2019  . Aspiration pneumonia ([place]) 02/15/2019  . Acute lower UTI 02/15/2019  . Acute on chronic respiratory failure with hypoxia and hypercapnia ([place]) 02/15/2019  . Acute systolic CHF (congestive heart failure) ([place]) 02/15/2019  . ARDS (adult respiratory distress syndrome) ([place])   . Hypertriglyceridemia   . NASH (nonalcoholic steatohepatitis) 01/27/2019  . COVID-19 01/26/2019  . Type 2 diabetes mellitus ([place]) 01/26/2019  . Hypomagnesemia 01/26/2019  . Acute non intractable tension-type headache 04/16/2018  . Encounter for annual physical exam 04/16/2018  . Chronic left-sided low back pain without sciatica 03/19/2018  . Seasonal allergic rhinitis 03/19/2018  . RLQ abdominal pain 10/24/2016  . Obstructive sleep apnea 08/27/2016  . Back pain 06/27/2016  . Prediabetes 03/02/2015  . Sinusitis, chronic 03/25/2014  . Tobacco use disorder 06/14/2012  . Knee pain, left 06/13/2012  . Morbid obesity ([place]) 04/11/2006  . Eczema 04/11/2006    Past Medical History:  Past Medical History:  Diagnosis Date  . Back pain   . Eczema   . Seasonal allergies   . Sleep apnea   . Type 2 diabetes mellitus ([place]) 01/26/2019   Past Surgical History:  Past Surgical History:  Procedure Laterality Date  . NO PAST SURGERIES      Assessment & Plan Clinical Impression: Patient is a 29 y.o. year old female with history of chronic back pain, morbid obesity with BMI of 47.85, eczema, sleep apnea, type  2 diabetes mellitus. Pt presented 01/26/2019 with progressive shortness of breath x2 days with sinus congestion, fatigue, malaise, low-grade fever and chills.  She had a history of exposure to positive COVID-19 2 days before the onset of her symptoms.  In the ED SARS coronavirus positive, glucose 257, hemoglobin A1c 11.1, D-dimer 0.53.  Chest x-ray no active disease however follow-up chest x-ray showed development of bilateral heterogeneous lung opacities consistent with COVID-19 pneumonia.  Patient did require high flow nasal cannula oxygen therapy.  She was placed on prednisone therapy and tapered off as well as antivirals.  Blood culture showing no growth to date.  Patient has been maintained on BiPAP. Bouts of hypokalemia and placed on supplement.  Hospital course complicated by E. coli UTI again antibiotic treatment has been completed.  Patient was treated for acute systolic congestive heart failure maintained on Lasix monitoring hydration.  She is tolerating a regular diet.  Maintain on Lovenox for DVT prophylaxis with venous Doppler studies negative.WOC consult for left ankle wound advised hydrocolloid dressing daily. Patient transferred to CIR on 02/20/2019 .    Patient currently requires min/mod A with basic self-care skills secondary to muscle weakness, decreased cardiorespiratoy endurance and decreased oxygen support and decreased standing balance, decreased postural control and decreased balance strategies.  Prior to hospitalization, patient could complete BADL with independent .  Patient will benefit from skilled intervention to increase independence with basic self-care skills prior to discharge home with care partner.  Anticipate patient will require 24 hour supervision and follow up home health.  OT - End of Session Endurance Deficit: Yes Endurance  Deficit Description: multiple rest breaks within BADL tasks OT Assessment Rehab Potential (ACUTE ONLY): Excellent OT Patient demonstrates  impairments in the following area(s): Balance;Endurance;Motor;Pain;Sensory OT Basic ADL's Functional Problem(s): Grooming;Bathing;Dressing;Toileting OT Transfers Functional Problem(s): Toilet;Tub/Shower OT Additional Impairment(s): None OT Plan OT Intensity: Minimum of 1-2 x/day, 45 to 90 minutes OT Frequency: 5 out of 7 days OT Duration/Estimated Length of Stay: 7-10 days OT Treatment/Interventions: Balance/vestibular training;Community reintegration;Discharge planning;Disease mangement/prevention;DME/adaptive equipment instruction;Functional mobility training;Pain management;Patient/family education;Psychosocial support;Self Care/advanced ADL retraining;Skin care/wound managment;Therapeutic Activities;Therapeutic Exercise;UE/LE Coordination activities;UE/LE Strength taining/ROM OT Self Feeding Anticipated Outcome(s): Independent OT Basic Self-Care Anticipated Outcome(s): Supervision OT Toileting Anticipated Outcome(s): Supervision OT Bathroom Transfers Anticipated Outcome(s): Supervision OT Recommendation Recommendations for Other Services: Therapeutic Recreation consult Therapeutic Recreation Interventions: Outing/community reintergration;Stress management Patient destination: Home Follow Up Recommendations: Home health OT Equipment Recommended: To be determined   Skilled Therapeutic Intervention Pt greeted already sitting up in recliner and agreeable to OT treatment session. Pt reported neuropathic pain in B feet which is a 20/10 this morning. Pt still agreeable to therapy. Pt on room air throughout session with SpO2 maintained a92% and above. Pt reported need to go to the bathroom. Mod A sit<>stand from lower recliner, then min A to ambulate into bathroom. Pt with successful Bm and voided bladder. Sit<>stand from lower commode with mod A. Pt then was able to assist with peri-care in standing but needed OT assist for thoroughness. Pt agreeable to shower. Pt ambulated to the shower with RW  and CGA. Bathing completed with assist to wash buttocks and lower extremeties. Stand-pivot out of shower with min A. Dressing completed from wc with min A overall, but max A to don socks. Pt needed increased time 2/2 fatigue and need for rest breaks within BADL tasks. Stand-pivot back to recliner at end of session with min A. Pt left with chair alarm on, call bell in reach, and needs met.   OT Evaluation Precautions/Restrictions  Precautions Precautions: Fall Restrictions Weight Bearing Restrictions: No Pain Pain Assessment Pain Scale: 0-10 Pain Score: 6  Pain Type: Neuropathic pain Pain Location: Leg Pain Orientation: Right;Anterior Pain Descriptors / Indicators: Aching;Burning Pain Frequency: Constant Pain Onset: On-going Pain Intervention(s): Medication (See eMAR) Home Living/Prior [place] expects to be discharged to:: Private residence Living Arrangements: Spouse/significant other Available Help at Discharge: Family, Available 24 hours/day Type of Home: Apartment Home Access: Stairs to enter [employer] of Steps: 8 Entrance Stairs-Rails: Right Home Layout: One level Bathroom Shower/Tub: Tub/shower unit, Industrial/product designer: Yes  Lives With: Family, Other (Comment)(Lives with roomate, but planning to dc to her mothers house ) IADL History Type of Occupation: Works at [employer] and Hobbies: Magazine features editor games Prior Function Level of Independence: Independent with basic ADLs, Independent with homemaking with ambulation ADL ADL Eating: Set up Grooming: Contact guard Upper Body Bathing: Supervision/safety Lower Body Bathing: Moderate assistance Upper Body Dressing: Minimal assistance Lower Body Dressing: Moderate assistance Toileting: Moderate assistance Toilet Transfer: Moderate assistance Vision Baseline Vision/History: No visual deficits Cognition Overall Cognitive Status:  Within Functional Limits for tasks assessed Arousal/Alertness: Awake/alert Orientation Level: Person;Place;Situation Person: Oriented Place: Oriented Situation: Oriented Year: 2021 Month: January Day of Week: Correct Memory: Appears intact Immediate Memory Recall: Sock;Blue;Bed Memory Recall Sock: Without Cue Memory Recall Blue: Without Cue Memory Recall Bed: Without Cue Awareness: Appears intact Problem Solving: Appears intact Safety/Judgment: Appears intact Sensation Sensation Light Touch: Impaired by gross assessment Additional Comments: B LE neuropothy Coordination Gross Motor Movements are Fluid and  Coordinated: No Fine Motor Movements are Fluid and Coordinated: Yes Coordination and Movement Description: generalized weakness Motor  Motor Motor - Skilled Clinical Observations: Generalized weakness Balance Balance Balance Assessed: Yes Static Sitting Balance Static Sitting - Level of Assistance: 5: Stand by assistance Dynamic Sitting Balance Dynamic Sitting - Level of Assistance: 5: Stand by assistance Static Standing Balance Static Standing - Balance Support: During functional activity Static Standing - Level of Assistance: 4: Min assist Dynamic Standing Balance Dynamic Standing - Balance Support: During functional activity Dynamic Standing - Level of Assistance: 4: Min assist;3: Mod assist Extremity/Trunk Assessment RUE Assessment RUE Assessment: Exceptions to WFL(4/5 overall, generalized weakness) LUE Assessment LUE Assessment: Exceptions to WFL(4/5 overall, generalized weakness)     Refer to Care Plan for Long Term Goals  Recommendations for other services: Therapeutic Recreation  Stress management and Outing/community reintegration   Discharge Criteria: Patient will be discharged from OT if patient refuses treatment 3 consecutive times without medical reason, if treatment goals not met, if there is a change in medical status, if patient makes no progress  towards goals or if patient is discharged from hospital.  The above assessment, treatment plan, treatment alternatives and goals were discussed and mutually agreed upon: by patient  Valma Cava 02/21/2019, 12:52 PM

## 2019-02-20 NOTE — Discharge Summary (Signed)
Physician Discharge Summary  Anita Hunter DGL:[id number] DOB: 04-28-1990 DOA: 01/26/2019  PCP: [name], MD  Admit date: 01/26/2019  Discharge date: 02/20/2019  Admitted From: Home Disposition: Acute inpatient rehab  Recommendations for Outpatient Follow-up:  1. Follow up with PCP in 1-2 weeks after rehab discharge 2. Please obtain BMP/CBC in 3 to 4 days 3.    Discharge Condition: Stable CODE STATUS: Full code Diet recommendation: Low-carb diet  Discharge summary: 29 year old lady with history of diabetes, morbid obesity, obstructive sleep apnea not using CPAP admitted on 12/14 with Covid pneumonia, transferred to ICU on 12/19 for worsening hypoxia.  Intubated with mechanical ventilation on 12/20-12/30. Extubated on 12/30.  Treated for UTI and aspiration pneumonia.  Oxygen requirement improving.  COVID-19 pneumonia with acute on chronic respiratory failure with hypoxia and hypercarbia, ARDS complicated by aspiration pneumonia and underlying obstructive sleep apnea: Clinically improving.  Oxygen as needed to keep saturation more than 89%.  Intermittently on 2 to 3 L of oxygen. Can use BiPAP at night, however patient has been refusing.  She will definitely benefit with CPAP or BiPAP at night. Finished all antibiotics, steroids and antiviral therapies.  Type 2 diabetes with hyperglycemia: Newly diagnosed.  A1c 11.  Was not on any treatment at home.  Patient was started on insulin regimen in the hospital including Levemir and prandial insulin.  Also on sliding scale insulin.  She will need insulin therapy on discharge and outpatient follow-up.    Acute kidney injury: Improved.  E. coli UTI: Treated and completed course.  Deconditioning: Patient is reportedly physical deconditioning.  She will benefit with aggressive inpatient therapies.    Peripheral neuropathy: Started on gabapentin.  Will increase dose to 200 mg 3 times a day today.   Patient also has some peripheral  edema, normal renal function, will start on low-dose diuretics along with potassium supplement.    Patient is medically stable.  She will be transferred to inpatient rehab to work with multidisciplinary therapies.  Discharge Diagnoses:  Principal Problem:   Acute on chronic respiratory failure with hypoxia and hypercapnia (HCC) Active Problems:   Morbid obesity ([place])   Obstructive sleep apnea   COVID-19   Type 2 diabetes mellitus (HCC)   Hypomagnesemia   NASH (nonalcoholic steatohepatitis)   Hypertriglyceridemia   ARDS (adult respiratory distress syndrome) (HCC)   Aspiration pneumonia (HCC)   Acute lower UTI   Acute systolic CHF (congestive heart failure) [name])    Discharge Instructions  Discharge Instructions    Diet Carb Modified   Complete by: As directed    Increase activity slowly   Complete by: As directed      Allergies as of 02/20/2019      Reactions   Shellfish Allergy Nausea Only      Medication List    STOP taking these medications   [facility] Severe Cold/Flu Liquid Lqpk Generic drug: Phenyleph-Diphenhyd-DM-APAP   triamcinolone ointment 0.1 % Commonly known as: KENALOG     TAKE these medications   acetaminophen 500 MG tablet Commonly known as: TYLENOL Take 1,000 mg by mouth every 6 (six) hours as needed for moderate pain or fever.   albuterol 108 (90 Base) MCG/ACT inhaler Commonly known as: VENTOLIN HFA Inhale 2 puffs into the lungs every 6 (six) hours as needed for wheezing or shortness of breath.   fluticasone 50 MCG/ACT nasal spray Commonly known as: FLONASE Place 2 sprays into both nostrils daily.   furosemide 40 MG tablet Commonly known as: LASIX Take 1 tablet (  40 mg total) by mouth daily. Start taking on: February 21, 2019   gabapentin 100 MG capsule Commonly known as: NEURONTIN Take 2 capsules (200 mg total) by mouth 3 (three) times daily.   insulin aspart 100 UNIT/ML injection Commonly known as: novoLOG Inject 6 Units into the skin 3  (three) times daily with meals.   insulin aspart 100 UNIT/ML injection Commonly known as: novoLOG Inject 0-20 Units into the skin every 4 (four) hours.   insulin detemir 100 UNIT/ML injection Commonly known as: LEVEMIR Inject 0.35 mLs (35 Units total) into the skin 2 (two) times daily.   magnesium oxide 400 (241.3 Mg) MG tablet Commonly known as: MAG-OX Take 1 tablet (400 mg total) by mouth 2 (two) times daily.   multivitamin with minerals Tabs tablet Take 1 tablet by mouth daily. Start taking on: February 21, 2019   pantoprazole 40 MG tablet Commonly known as: PROTONIX Take 1 tablet (40 mg total) by mouth daily. Start taking on: February 21, 2019   potassium chloride SA 20 MEQ tablet Commonly known as: KLOR-CON Take 2 tablets (40 mEq total) by mouth daily.       Allergies  Allergen Reactions  . Shellfish Allergy Nausea Only    Consultations:  PCCM   Procedures/Studies: DG Chest 1 View  Result Date: 01/31/2019 CLINICAL DATA:  Respiratory distress syndrome EXAM: CHEST  1 VIEW COMPARISON:  01/29/2019 FINDINGS: Near complete whiteout of the thorax likely representing progression of bilateral airspace opacities and underpenetration of the study secondary to patient body habitus. Cardiomediastinal silhouette is obscured. IMPRESSION: Near complete whiteout of the thorax, likely representing progression of bilateral airspace opacities and underpenetration of the study secondary to patient body habitus. Electronically Signed   By: Davina Poke M.D.   On: 01/31/2019 14:02   DG Abd 1 View  Result Date: 02/01/2019 CLINICAL DATA:  Initial evaluation for NG tube placement. EXAM: ABDOMEN - 1 VIEW COMPARISON:  Prior CT from 11/28/2018. FINDINGS: Enteric tube in place with tip overlying the distal stomach, side hole beyond the GE junction. Visualized bowel gas pattern is nonobstructive. IMPRESSION: Enteric tube in place with tip overlying the distal stomach. Side hole well beyond the  GE junction. Electronically Signed   By: Jeannine Boga M.D.   On: 02/01/2019 00:52   DG CHEST PORT 1 VIEW  Result Date: 02/11/2019 CLINICAL DATA:  Intubated, COVID-19 EXAM: PORTABLE CHEST 1 VIEW COMPARISON:  02/09/2019 chest radiograph. FINDINGS: Endotracheal tube tip is 3.8 cm above the carina. Right PICC enters the SVC with the tip not well visualized on this radiograph. Enteric tube enters stomach with the tip not seen on this image. Stable cardiomediastinal silhouette with normal heart size. No pneumothorax. No pleural effusion. Worsening patchy opacities throughout both lungs. IMPRESSION: 1. Well-positioned support structures.  No pneumothorax. 2. Worsening patchy opacities throughout both lungs compatible with pneumonia. Electronically Signed   By: Ilona Sorrel M.D.   On: 02/11/2019 10:25   DG Chest Port 1 View  Result Date: 02/09/2019 CLINICAL DATA:  Acute respiratory failure. EXAM: PORTABLE CHEST 1 VIEW COMPARISON:  02/08/2019 FINDINGS: The endotracheal tube is 3.3 cm above the carina. The right PICC line tip is in the right atrium. The NG tube is coursing down the esophagus and into the stomach. Persistent lower lobe predominant interstitial and airspace process in the lungs. No pleural effusions. No pneumothorax. IMPRESSION: 1. Stable support apparatus. 2. Persistent lower lobe predominant interstitial and airspace process in the lungs. Electronically Signed   By: Mamie Nick.  Gallerani M.D.   On: 02/09/2019 05:16   DG Chest Port 1 View  Result Date: 02/08/2019 CLINICAL DATA:  Acute respiratory failure. EXAM: PORTABLE CHEST 1 VIEW COMPARISON:  02/07/2019 and 02/06/2019 FINDINGS: Endotracheal tube has tip 3.4 cm above the carina. Enteric tube courses into the region of the stomach and off the film as tip is not visualized. Right-sided PICC line unchanged. Lungs are somewhat hypoinflated demonstrate persistent hazy airspace opacification over the mid to lower lungs. No effusion.  Cardiomediastinal silhouette and remainder of the exam is unchanged. IMPRESSION: Persistent hazy airspace density over the mid to lower lungs which may be due to infection versus edema. Tubes and lines as described. Electronically Signed   By: Marin Olp M.D.   On: 02/08/2019 06:54   DG Chest Port 1 View  Result Date: 02/07/2019 CLINICAL DATA:  Acute respiratory failure with hypoxia. EXAM: PORTABLE CHEST 1 VIEW COMPARISON:  02/06/2019 FINDINGS: Endotracheal tube has tip 3.4 cm above the carina. Enteric tube courses into the stomach as side-port is seen over the stomach in the left upper quadrant and tip not visualized. Right-sided PICC line has tip over the SVC. Lungs are somewhat hypoinflated as there are prominent overlying soft tissues. Stable hazy bilateral airspace opacification over the lungs left worse than right. Stable cardiomegaly. Remainder of the exam is unchanged. IMPRESSION: 1. Stable bilateral hazy airspace process which may be due to infection or edema. Stable cardiomegaly. 2.  Tubes and lines as described Electronically Signed   By: Marin Olp M.D.   On: 02/07/2019 08:04   DG Chest Port 1 View  Result Date: 02/06/2019 CLINICAL DATA:  ? Vomiting vs dislodged NGT with aspiration. Assess for new infiltrate EXAM: PORTABLE CHEST 1 VIEW COMPARISON:  Radiographs 02/04/2019, 02/01/2019 and 01/31/2019. FINDINGS: 1658 hours. Two views obtained. Tip of the endotracheal tube is stable, approximately 3 cm above the carina. Nasogastric tube projects below the diaphragm into the stomach. A right arm PICC is not well visualized, although appears unchanged, terminating at the level of the upper right atrium. The heart size and mediastinal contours are stable. There are diffuse bilateral airspace opacities which have slightly worsened compared with the most recent study of 2 days ago. No pneumothorax or significant pleural effusion identified. The bones appear intact. IMPRESSION: 1. Worsened diffuse  bilateral airspace opacities since the most recent study of 2 days ago. 2. Stable support system, positioned as above. Electronically Signed   By: Richardean Sale M.D.   On: 02/06/2019 18:34   DG Chest Port 1 View  Result Date: 02/04/2019 CLINICAL DATA:  Acute respiratory failure with hypoxia EXAM: PORTABLE CHEST 1 VIEW COMPARISON:  Three days ago FINDINGS: Endotracheal tube tip at the clavicular heads. The enteric tube at least reaches the stomach. Right PICC with tip at the upper cavoatrial junction. Bilateral airspace disease asymmetric to the left. There has been improvement from prior. Lung volumes are low. No effusion or pneumothorax. Prominent heart size accentuated by low lung volumes. IMPRESSION: 1. Unremarkable hardware positioning. 2. Improved aeration although lung volumes remain low. Electronically Signed   By: Monte Fantasia M.D.   On: 02/04/2019 08:43   DG CHEST PORT 1 VIEW  Result Date: 02/01/2019 CLINICAL DATA:  Intubation. EXAM: PORTABLE CHEST 1 VIEW COMPARISON:  Chest radiograph yesterday. FINDINGS: Endotracheal tube tip at the clavicular heads. Enteric tube in place with tip below the diaphragm not included in the field of view. Right upper extremity PICC tip in the atrial caval junction. Low lung  volumes with slight decreased extensive bilateral opacities throughout both lungs. Heart appears prominent in size. No visualized pneumothorax. IMPRESSION: 1. Endotracheal tube tip at the clavicular heads. Tip of the right upper extremity PICC at the atrial caval junction. 2. Persistent low lung volumes. Interval decrease in extensive bilateral pulmonary opacities since yesterday. Electronically Signed   By: Keith Rake M.D.   On: 02/01/2019 00:54   DG CHEST PORT 1 VIEW  Result Date: 01/29/2019 CLINICAL DATA:  Increasing shortness of breath. Low oxygen saturation. COVID-19 positive. EXAM: PORTABLE CHEST 1 VIEW COMPARISON:  02/05/2019 FINDINGS: Low lung volumes. Development of  bilateral heterogeneous opacities since prior exam. Prominent heart size likely accentuated by low lung volumes. No pleural fluid or pneumothorax. IMPRESSION: 1. Low lung volumes. Development of bilateral heterogeneous lung opacities consistent with COVID-19 pneumonia. 2. Prominent heart size likely accentuated by low lung volumes. Electronically Signed   By: Keith Rake M.D.   On: 01/29/2019 04:15   DG Chest Portable 1 View  Result Date: 01/26/2019 CLINICAL DATA:  Cough.  Coronavirus infection. EXAM: PORTABLE CHEST 1 VIEW COMPARISON:  None. FINDINGS: The heart size and mediastinal contours are within normal limits. Both lungs are clear. The visualized skeletal structures are unremarkable. IMPRESSION: No active disease. Electronically Signed   By: Nelson Chimes M.D.   On: 01/26/2019 11:29   ECHOCARDIOGRAM COMPLETE  Result Date: 02/06/2019   ECHOCARDIOGRAM REPORT   Patient Name:   Anita Hunter Date of Exam: 02/06/2019 Medical Rec #:  [id number]         Height:       69.0 in Accession #:    [id number]        Weight:       341.5 lb Date of Birth:  Aug 11, 1990          BSA:          2.59 m Patient Age:    28 years          BP:           128/70 mmHg Patient Gender: F                 HR:           83 bpm. Exam Location:  Inpatient Procedure: 2D Echo Indications:    Dyspnea 786.09 / R06.00  History:        Patient has no prior history of Echocardiogram examinations.                 Risk Factors:Sleep Apnea and Diabetes. Covid pneumonia.  Sonographer:    Darlina Sicilian RDCS Referring Phys: [place]  Sonographer Comments: Image acquisition challenging due to patient body habitus. [place] IMPRESSIONS  1. Left ventricular ejection fraction, by visual estimation, is 40 to 45%. The left ventricle has mildly decreased function. There is no left ventricular hypertrophy. LV not seen well at all. EF appears mildly reduced but may be low normal. Consider repeat study with Definity contrast.  2.  Global right ventricle has mildly reduced systolic function.The right ventricular size is mildly enlarged. No increase in right ventricular wall thickness.  3. Left atrial size was normal.  4. Right atrial size was not well visualized.  5. The mitral valve is normal in structure. No evidence of mitral valve regurgitation.  6. The tricuspid valve is normal in structure.  7. The aortic valve is normal in structure. Aortic valve regurgitation is not visualized.  8. The pulmonic valve was  grossly normal. Pulmonic valve regurgitation is not visualized.  9. The interatrial septum was not well visualized. FINDINGS  Left Ventricle: Left ventricular ejection fraction, by visual estimation, is 40 to 45%. The left ventricle has mildly decreased function. The left ventricle is not well visualized. There is no left ventricular hypertrophy. Left ventricular diastolic parameters were normal. Right Ventricle: The right ventricular size is mildly enlarged. No increase in right ventricular wall thickness. Global RV systolic function is has mildly reduced systolic function. Left Atrium: Left atrial size was normal in size. Right Atrium: Right atrial size was not well visualized Pericardium: There is no evidence of pericardial effusion. Mitral Valve: The mitral valve is normal in structure. No evidence of mitral valve regurgitation. Tricuspid Valve: The tricuspid valve is normal in structure. Tricuspid valve regurgitation is trivial. Aortic Valve: The aortic valve is normal in structure. Aortic valve regurgitation is not visualized. Pulmonic Valve: The pulmonic valve was grossly normal. Pulmonic valve regurgitation is not visualized. Pulmonic regurgitation is not visualized. Aorta: The aortic root and ascending aorta are structurally normal, with no evidence of dilitation. IAS/Shunts: The interatrial septum was not well visualized.  LEFT VENTRICLE PLAX 2D LVIDd:         5.04 cm  Diastology LVIDs:         3.69 cm  LV e' lateral:   10.40  cm/s LV PW:         1.04 cm  LV E/e' lateral: 6.8 LV IVS:        1.15 cm  LV e' medial:    8.05 cm/s LVOT diam:     2.20 cm  LV E/e' medial:  8.8 LV SV:         63 ml LV SV Index:   22.13 LVOT Area:     3.80 cm  LEFT ATRIUM         Index LA diam:    3.80 cm 1.47 cm/m  AORTIC VALVE LVOT Vmax:   97.90 cm/s LVOT Vmean:  66.600 cm/s LVOT VTI:    0.150 m  AORTA Ao Root diam: 2.60 cm Ao Asc diam:  2.50 cm MITRAL VALVE MV Area (PHT): 3.39 cm             SHUNTS MV PHT:        64.96 msec           Systemic VTI:  0.15 m MV Decel Time: 224 msec             Systemic Diam: 2.20 cm MV E velocity: 71.00 cm/s 103 cm/s MV A velocity: 44.50 cm/s 70.3 cm/s MV E/A ratio:  1.60       1.5  Glori Bickers MD Electronically signed by Glori Bickers MD Signature Date/Time: 02/06/2019/3:26:07 PM    Final    VAS [place] LOWER EXTREMITY VENOUS (DVT)  Result Date: 02/03/2019  Lower Venous Study Indications: SOB. Other Indications: Covid positive. Limitations: Body habitus. Performing Technologist: Antonieta Pert RDMS, RVT  Examination Guidelines: A complete evaluation includes B-mode imaging, spectral Doppler, color Doppler, and power Doppler as needed of all accessible portions of each vessel. Bilateral testing is considered an integral part of a complete examination. Limited examinations for reoccurring indications may be performed as noted.  +---------+---------------+---------+-----------+----------+--------------+ RIGHT    CompressibilityPhasicitySpontaneityPropertiesThrombus Aging +---------+---------------+---------+-----------+----------+--------------+ CFV      Full           Yes      Yes                                 +---------+---------------+---------+-----------+----------+--------------+  SFJ      Full                                                        +---------+---------------+---------+-----------+----------+--------------+ FV Prox  Full                                                         +---------+---------------+---------+-----------+----------+--------------+ FV Mid   Full                                                        +---------+---------------+---------+-----------+----------+--------------+ FV DistalFull                                                        +---------+---------------+---------+-----------+----------+--------------+ PFV      Full                                                        +---------+---------------+---------+-----------+----------+--------------+ POP      Full           Yes      Yes                                 +---------+---------------+---------+-----------+----------+--------------+ PTV      Full                                                        +---------+---------------+---------+-----------+----------+--------------+ PERO     Full                                                        +---------+---------------+---------+-----------+----------+--------------+ GSV      Full                                                        +---------+---------------+---------+-----------+----------+--------------+   +---------+---------------+---------+-----------+----------+--------------+ LEFT     CompressibilityPhasicitySpontaneityPropertiesThrombus Aging +---------+---------------+---------+-----------+----------+--------------+ CFV      Full           Yes      Yes                                 +---------+---------------+---------+-----------+----------+--------------+  SFJ      Full                                                        +---------+---------------+---------+-----------+----------+--------------+ FV Prox  Full                                                        +---------+---------------+---------+-----------+----------+--------------+ FV Mid   Full                                                         +---------+---------------+---------+-----------+----------+--------------+ FV DistalFull                                                        +---------+---------------+---------+-----------+----------+--------------+ PFV      Full                                                        +---------+---------------+---------+-----------+----------+--------------+ POP      Full           Yes      Yes                                 +---------+---------------+---------+-----------+----------+--------------+ PTV      Full                                                        +---------+---------------+---------+-----------+----------+--------------+ PERO     Full                                                        +---------+---------------+---------+-----------+----------+--------------+ GSV      Full                                                        +---------+---------------+---------+-----------+----------+--------------+     Summary: Right: There is no evidence of deep vein thrombosis in the lower extremity. No cystic structure found in the popliteal fossa. Left: There is no evidence of deep vein thrombosis in the lower extremity. No cystic structure found in the popliteal fossa.  *See  table(s) above for measurements and observations. Electronically signed by Deitra Mayo MD on 02/03/2019 at 5:21:57 PM.    Final    [place] EKG SITE RITE  Result Date: 01/31/2019 If Site Rite image not attached, placement could not be confirmed due to current cardiac rhythm.   (Echo, Carotid, EGD, Colonoscopy, ERCP)    Subjective: Patient seen and examined.  No overnight events.  Still has some tingling sensation on both feet.  Does not want to wear CPAP at night that makes her uncomfortable.  Motivated to go to rehab.   Discharge Exam: Vitals:   02/20/19 0422 02/20/19 0827  BP: 119/62 140/72  Pulse: (!) 110 100  Resp: (!) 22 17  Temp: 99.3 F (37.4 C)    SpO2: 90% (!) 89%   Vitals:   02/19/19 2342 02/20/19 0422 02/20/19 0827 02/20/19 0831  BP: (!) 102/54 119/62 140/72   Pulse:  (!) 110 100   Resp: 20 (!) 22 17   Temp: 98.7 F (37.1 C) 99.3 F (37.4 C)    TempSrc: Oral Oral Oral Oral  SpO2: 92% 90% (!) 89%   Weight:      Height:        General: Pt is alert, awake, not in acute distress, on room air. Cardiovascular: RRR, S1/S2 +, no rubs, no gallops Respiratory: CTA bilaterally, no wheezing, no rhonchi Abdominal: Soft, NT, ND, bowel sounds +, obese and pendulous. Extremities: Trace bilateral edema mostly on the dorsum of the foot.  no cyanosis, peripheral circulation is intact.    The results of significant diagnostics from this hospitalization (including imaging, microbiology, ancillary and laboratory) are listed below for reference.     Microbiology: Recent Results (from the past 240 hour(s))  Urine culture     Status: None   Collection Time: 02/10/19  3:15 PM   Specimen: Urine, Catheterized  Result Value Ref Range Status   Specimen Description   Final    URINE, CATHETERIZED Performed at [facility] [address], [place], [place] [address]    Special Requests   Final    NONE Performed at [facility], [place] [address], [place], [place] [address]    Culture   Final    NO GROWTH Performed at [facility], [place] [address] [place], [place] [address]    Report Status 02/11/2019 FINAL  Final  Culture, blood (routine x 2)     Status: None   Collection Time: 02/10/19  3:30 PM   Specimen: BLOOD  Result Value Ref Range Status   Specimen Description   Final    BLOOD LEFT HAND Performed at [facility] [address], [place], [place] [address]    Special Requests   Final    BOTTLES DRAWN AEROBIC ONLY Blood Culture adequate volume Performed at [facility] [address] [place], [place] [address]    Culture   Final    NO GROWTH 5  DAYS Performed at [facility], [place] [address] [place], [place] [address]    Report Status 02/15/2019 FINAL  Final  Culture, blood (routine x 2)     Status: None   Collection Time: 02/10/19  3:40 PM   Specimen: BLOOD  Result Value Ref Range Status   Specimen Description   Final    BLOOD LEFT HAND Performed at [facility] [address], [place], [place] [address]    Special Requests   Final  BOTTLES DRAWN AEROBIC ONLY Blood Culture adequate volume Performed at [facility] [address] [place], [place] [address]    Culture   Final    NO GROWTH 5 DAYS Performed at [facility], [place] [address], [place], [place] [address]    Report Status 02/15/2019 FINAL  Final  Culture, respiratory (non-expectorated)     Status: None   Collection Time: 02/10/19  5:34 PM   Specimen: Tracheal Aspirate; Respiratory  Result Value Ref Range Status   Specimen Description   Final    TRACHEAL ASPIRATE Performed at [facility] [address], [place], [place] [address]    Special Requests   Final    NONE Performed at [facility], [place] [address], [place], [place] [address]    Gram Stain   Final    ABUNDANT WBC PRESENT, PREDOMINANTLY PMN RARE GRAM POSITIVE COCCI Performed at [facility], [place] [address], [place], [place] [address]    Culture RARE STAPHYLOCOCCUS AUREUS  Final   Report Status 02/13/2019 FINAL  Final   Organism ID, Bacteria STAPHYLOCOCCUS AUREUS  Final      Susceptibility   Staphylococcus aureus - MIC*    CIPROFLOXACIN <=0.5 SENSITIVE Sensitive     ERYTHROMYCIN <=0.25 SENSITIVE Sensitive     GENTAMICIN <=0.5 SENSITIVE Sensitive     OXACILLIN 0.5 SENSITIVE Sensitive     TETRACYCLINE <=1 SENSITIVE Sensitive     VANCOMYCIN 1 SENSITIVE Sensitive     TRIMETH/SULFA <=10 SENSITIVE Sensitive     CLINDAMYCIN <=0.25 SENSITIVE Sensitive     RIFAMPIN <=0.5 SENSITIVE Sensitive      Inducible Clindamycin NEGATIVE Sensitive     * RARE STAPHYLOCOCCUS AUREUS     Labs: BNP (last 3 results) Recent Labs    01/26/19 1521 02/15/19 1600  BNP 10.5 60.7   Basic Metabolic Panel: Recent Labs  Lab 02/14/19 0635 02/15/19 0500 02/15/19 1503 02/16/19 1121 02/20/19 0054  NA 144 144 140 141 136  K 3.2* 3.1* 2.9* 3.7 3.3*  CL 98 100  --  99 97*  CO2 29 34*  --  32 26  GLUCOSE 198* 159*  --  165* 101*  BUN 23* 20  --  15 9  CREATININE 0.66 0.59  --  0.66 0.65  CALCIUM 8.0* 8.3*  --  8.3* 8.4*  MG 2.1 1.9  --  1.8 1.7  PHOS  --   --   --   --  3.2   Liver Function Tests: Recent Labs  Lab 02/16/19 1121  AST 64*  ALT 36  ALKPHOS 40  BILITOT 1.0  PROT 6.5  ALBUMIN 3.0*   No results for input(s): LIPASE, AMYLASE in the last 168 hours. No results for input(s): AMMONIA in the last 168 hours. CBC: Recent Labs  Lab 02/14/19 0437 02/15/19 1503 02/16/19 1121  WBC 9.0  --  6.9  HGB 11.5* 10.5* 11.6*  HCT 39.4 31.0* 38.7  MCV 94.7  --  94.6  PLT 139*  --  165   Cardiac Enzymes: No results for input(s): CKTOTAL, CKMB, CKMBINDEX, TROPONINI in the last 168 hours. BNP: Invalid input(s): POCBNP CBG: Recent Labs  Lab 02/19/19 1628 02/19/19 2052 02/19/19 2354 02/20/19 0358 02/20/19 0858  GLUCAP 91 122* 104* 121* 126*   D-Dimer No results for input(s): DDIMER in the last 72 hours. Hgb A1c No results for input(s): HGBA1C in the last 72 hours. Lipid Profile No results for input(s): CHOL, HDL, LDLCALC,  TRIG, CHOLHDL, LDLDIRECT in the last 72 hours. Thyroid function studies No results for input(s): TSH, T4TOTAL, T3FREE, THYROIDAB in the last 72 hours.  Invalid input(s): FREET3 Anemia work up No results for input(s): VITAMINB12, FOLATE, FERRITIN, TIBC, IRON, RETICCTPCT in the last 72 hours. Urinalysis    Component Value Date/Time   COLORURINE YELLOW 11/27/2018 2358   APPEARANCEUR CLOUDY (A) 11/27/2018 2358   LABSPEC 1.025 11/27/2018 2358   PHURINE 6.0  11/27/2018 2358   GLUCOSEU NEGATIVE 11/27/2018 2358   HGBUR NEGATIVE 11/27/2018 2358   BILIRUBINUR NEGATIVE 11/27/2018 2358   KETONESUR 15 (A) 11/27/2018 2358   PROTEINUR NEGATIVE 11/27/2018 2358   NITRITE NEGATIVE 11/27/2018 2358   LEUKOCYTESUR NEGATIVE 11/27/2018 2358   Sepsis Labs Invalid input(s): PROCALCITONIN,  WBC,  LACTICIDVEN Microbiology Recent Results (from the past 240 hour(s))  Urine culture     Status: None   Collection Time: 02/10/19  3:15 PM   Specimen: Urine, Catheterized  Result Value Ref Range Status   Specimen Description   Final    URINE, CATHETERIZED Performed at [facility], [place] [address], [place], [place] [address]    Special Requests   Final    NONE Performed at [facility], [place] [address] [place], [place] [address]    Culture   Final    NO GROWTH Performed at [facility], [place] [address] [place], [place] [address]    Report Status 02/11/2019 FINAL  Final  Culture, blood (routine x 2)     Status: None   Collection Time: 02/10/19  3:30 PM   Specimen: BLOOD  Result Value Ref Range Status   Specimen Description   Final    BLOOD LEFT HAND Performed at [place] [address], [place], [place] [address]    Special Requests   Final    BOTTLES DRAWN AEROBIC ONLY Blood Culture adequate volume Performed at [place] [facility] [address], [place], [place] [address]    Culture   Final    NO GROWTH 5 DAYS Performed at [facility], [place] [address], [place], [place] [address]    Report Status 02/15/2019 FINAL  Final  Culture, blood (routine x 2)     Status: None   Collection Time: 02/10/19  3:40 PM   Specimen: BLOOD  Result Value Ref Range Status   Specimen Description   Final    BLOOD LEFT HAND Performed at [place] [address], [place], [place] [address]    Special Requests   Final    BOTTLES DRAWN AEROBIC ONLY Blood Culture  adequate volume Performed at [facility] [place] [address], [place], [place] [address]    Culture   Final    NO GROWTH 5 DAYS Performed at [facility], [place] [address] [place],  [address]    Report Status 02/15/2019 FINAL  Final  Culture, respiratory (non-expectorated)     Status: None   Collection Time: 02/10/19  5:34 PM   Specimen: Tracheal Aspirate; Respiratory  Result Value Ref Range Status   Specimen Description   Final    TRACHEAL ASPIRATE Performed at [place] [address], [place],  [address]    Special Requests   Final    NONE Performed at [facility], [place] [address] [place], [place] [address]    Gram Stain   Final    ABUNDANT WBC PRESENT, PREDOMINANTLY PMN RARE Lonell Grandchild  POSITIVE COCCI Performed at [facility], [place] [address], [place], [place] [address]    Culture RARE STAPHYLOCOCCUS AUREUS  Final   Report Status 02/13/2019 FINAL  Final   Organism ID, Bacteria STAPHYLOCOCCUS AUREUS  Final      Susceptibility   Staphylococcus aureus - MIC*    CIPROFLOXACIN <=0.5 SENSITIVE Sensitive     ERYTHROMYCIN <=0.25 SENSITIVE Sensitive     GENTAMICIN <=0.5 SENSITIVE Sensitive     OXACILLIN 0.5 SENSITIVE Sensitive     TETRACYCLINE <=1 SENSITIVE Sensitive     VANCOMYCIN 1 SENSITIVE Sensitive     TRIMETH/SULFA <=10 SENSITIVE Sensitive     CLINDAMYCIN <=0.25 SENSITIVE Sensitive     RIFAMPIN <=0.5 SENSITIVE Sensitive     Inducible Clindamycin NEGATIVE Sensitive     * RARE STAPHYLOCOCCUS AUREUS     Time coordinating discharge:  35 minutes  SIGNED:   Barb Merino, MD  [facility] 02/20/2019, 10:46 AM

## 2019-02-21 ENCOUNTER — Inpatient Hospital Stay (HOSPITAL_COMMUNITY): Payer: BC Managed Care – PPO

## 2019-02-21 ENCOUNTER — Other Ambulatory Visit: Payer: Self-pay

## 2019-02-21 ENCOUNTER — Inpatient Hospital Stay (HOSPITAL_COMMUNITY): Payer: BC Managed Care – PPO | Admitting: Occupational Therapy

## 2019-02-21 ENCOUNTER — Encounter (HOSPITAL_COMMUNITY): Payer: Self-pay | Admitting: Physical Medicine & Rehabilitation

## 2019-02-21 DIAGNOSIS — R5381 Other malaise: Principal | ICD-10-CM

## 2019-02-21 LAB — CBC WITH DIFFERENTIAL/PLATELET
Abs Immature Granulocytes: 0.11 10*3/uL — ABNORMAL HIGH (ref 0.00–0.07)
Basophils Absolute: 0 10*3/uL (ref 0.0–0.1)
Basophils Relative: 0 %
Eosinophils Absolute: 0.5 10*3/uL (ref 0.0–0.5)
Eosinophils Relative: 7 %
HCT: 36.2 % (ref 36.0–46.0)
Hemoglobin: 11.3 g/dL — ABNORMAL LOW (ref 12.0–15.0)
Immature Granulocytes: 2 %
Lymphocytes Relative: 25 %
Lymphs Abs: 1.6 10*3/uL (ref 0.7–4.0)
MCH: 28 pg (ref 26.0–34.0)
MCHC: 31.2 g/dL (ref 30.0–36.0)
MCV: 89.8 fL (ref 80.0–100.0)
Monocytes Absolute: 0.6 10*3/uL (ref 0.1–1.0)
Monocytes Relative: 9 %
Neutro Abs: 3.6 10*3/uL (ref 1.7–7.7)
Neutrophils Relative %: 57 %
Platelets: 331 10*3/uL (ref 150–400)
RBC: 4.03 MIL/uL (ref 3.87–5.11)
RDW: 14.7 % (ref 11.5–15.5)
WBC: 6.3 10*3/uL (ref 4.0–10.5)
nRBC: 0 % (ref 0.0–0.2)

## 2019-02-21 LAB — COMPREHENSIVE METABOLIC PANEL
ALT: 40 U/L (ref 0–44)
AST: 42 U/L — ABNORMAL HIGH (ref 15–41)
Albumin: 2.8 g/dL — ABNORMAL LOW (ref 3.5–5.0)
Alkaline Phosphatase: 43 U/L (ref 38–126)
Anion gap: 9 (ref 5–15)
BUN: 6 mg/dL (ref 6–20)
CO2: 27 mmol/L (ref 22–32)
Calcium: 8.2 mg/dL — ABNORMAL LOW (ref 8.9–10.3)
Chloride: 100 mmol/L (ref 98–111)
Creatinine, Ser: 0.69 mg/dL (ref 0.44–1.00)
GFR calc Af Amer: >60 mL/min (ref >60)
GFR calc non Af Amer: >60 mL/min (ref >60)
Glucose, Bld: 148 mg/dL — ABNORMAL HIGH (ref 70–99)
Potassium: 4 mmol/L (ref 3.5–5.1)
Sodium: 136 mmol/L (ref 135–145)
Total Bilirubin: 0.9 mg/dL (ref 0.3–1.2)
Total Protein: 6.3 g/dL — ABNORMAL LOW (ref 6.5–8.1)

## 2019-02-21 LAB — GLUCOSE, CAPILLARY
Glucose-Capillary: 120 mg/dL — ABNORMAL HIGH (ref 70–99)
Glucose-Capillary: 120 mg/dL — ABNORMAL HIGH (ref 70–99)
Glucose-Capillary: 206 mg/dL — ABNORMAL HIGH (ref 70–99)
Glucose-Capillary: 125 mg/dL — ABNORMAL HIGH (ref 70–99)
Glucose-Capillary: 137 mg/dL — ABNORMAL HIGH (ref 70–99)
Glucose-Capillary: 174 mg/dL — ABNORMAL HIGH (ref 70–99)

## 2019-02-21 MED ORDER — GABAPENTIN 300 MG PO CAPS
300.0000 mg | ORAL_CAPSULE | Freq: Three times a day (TID) | ORAL | Status: DC
  Administered 2019-02-21 – 2019-02-22 (×5): 300 mg via ORAL
  Filled 2019-02-21 (×5): qty 1

## 2019-02-21 MED ORDER — BOOST PLUS PO LIQD
237.0000 mL | Freq: Three times a day (TID) | ORAL | Status: DC
  Administered 2019-02-21 – 2019-03-02 (×23): 237 mL via ORAL
  Filled 2019-02-21 (×31): qty 237

## 2019-02-21 MED ORDER — INSULIN ASPART 100 UNIT/ML ~~LOC~~ SOLN
0.0000 [IU] | Freq: Three times a day (TID) | SUBCUTANEOUS | Status: DC
  Administered 2019-02-22: 3 [IU] via SUBCUTANEOUS
  Administered 2019-02-22: 4 [IU] via SUBCUTANEOUS
  Administered 2019-02-22 – 2019-02-23 (×2): 3 [IU] via SUBCUTANEOUS
  Administered 2019-02-24: 4 [IU] via SUBCUTANEOUS
  Administered 2019-02-25: 3 [IU] via SUBCUTANEOUS
  Administered 2019-02-25: 4 [IU] via SUBCUTANEOUS
  Administered 2019-02-26: 3 [IU] via SUBCUTANEOUS
  Administered 2019-02-26 – 2019-02-27 (×2): 4 [IU] via SUBCUTANEOUS
  Administered 2019-02-27 – 2019-03-02 (×6): 3 [IU] via SUBCUTANEOUS
  Administered 2019-03-03: 4 [IU] via SUBCUTANEOUS

## 2019-02-21 MED ORDER — PRO-STAT SUGAR FREE PO LIQD
30.0000 mL | Freq: Two times a day (BID) | ORAL | Status: DC
  Administered 2019-02-21 – 2019-03-01 (×15): 30 mL via ORAL
  Filled 2019-02-21 (×19): qty 30

## 2019-02-21 NOTE — Progress Notes (Addendum)
RT attempted to place pt on BIPAP dream station for the night and pt became very anxious and did not want to wear tonight. Pt unable to wear d/t level of anxiety. RT expressed to pt and family that if she would like to attempt to wear at another time tonight to please call. RT will continue to monitor.

## 2019-02-21 NOTE — Progress Notes (Signed)
Initial Nutrition Assessment  RD working remotely.  DOCUMENTATION CODES:   Morbid obesity  INTERVENTION:   - Boost Plus po TID between meals, each supplement provides 360 kcal and 14 grams of protein  - Pro-stat 30 ml po BID, each supplement provides 100 kcal and 15 grams of protein  - Continue MVI with minerals daily  NUTRITION DIAGNOSIS:   Increased nutrient needs related to acute illness, catabolic illness as evidenced by estimated needs.  GOAL:   Patient will meet greater than or equal to 90% of their needs  MONITOR:   PO intake, Supplement acceptance, Labs, Weight trends, Skin  REASON FOR ASSESSMENT:   Malnutrition Screening Tool    ASSESSMENT:   29 year old female with PMH of chronic back pain, morbid obesity, eczema, sleep apnea, T2DM. Presented 01/26/19 with progressive SOB with sinus congestion, fatigue, malaise, low-grade fever and chills. In the ED, pt found to be COVID-19 positive. Chest x-ray showing no active disease however follow-up chest x-ray showed development of bilateral heterogeneous lung opacities consistent with COVID-19 pneumonia. Pt admitted to CIR on 1/08.   Spoke with pt via phone call to room. Pt reports that she hasn't been eating much because the food they are sending her is food she does not like. Pt reports that the food at [facility] was "nasty" so she didn't eat much. Pt states that she has a good appetite and would eat if she got food she liked.  Discussed meal ordering process with pt and informed pt that an ambassador would be coming around to take her meal orders so that she will be able to order food she likes. Pt states that she likes cereal, soup, and sandwiches.  Pt states that she has not lost weight "since I've been off the ventilator." Pt reports her UBW as 365 lbs. Reviewed weights in chart. Pt with a 13.4 kg weight loss since 11/27/18. This is an 8.1% weight loss in 3 months which is significant for timeframe.  Meal Completion: 75% x  1 meal  Medications reviewed and include: Pepcid, Lasix, SSI q 4 hours, Novolog 6 units TID with meals, Levemir 35 units BID, MVI with minerals, protonix, Klor-con 40 mEq BID  Labs reviewed. CBG's: 75-206 x 24 hours  NUTRITION - FOCUSED PHYSICAL EXAM:  Unable to complete at this time. RD working remotely.  Diet Order:   Diet Order            Diet Carb Modified Fluid consistency: Thin; Room service appropriate? Yes  Diet effective now              EDUCATION NEEDS:   Education needs have been addressed  Skin:  Skin Assessment: Skin Integrity Issues: Skin Integrity Issues: Stage II: right buttocks, left buttocks Unstageable: left ankle Other: skin tear to back  Last BM:  02/20/19 type 6  Height:   Ht Readings from Last 1 Encounters:  01/29/19 5' 9"  (1.753 m)    Weight:   Wt Readings from Last 1 Encounters:  02/21/19 (!) 151.3 kg    Ideal Body Weight:  65.9 kg  BMI:  Body mass index is 49.26 kg/m.  Estimated Nutritional Needs:   Kcal:  2600-2800  Protein:  130-150 grams  Fluid:  >/= 2.2 L    Gaynell Face, MS, RD, LDN Inpatient Clinical Dietitian Pager: [phone] Weekend/After Hours: [phone]

## 2019-02-21 NOTE — Progress Notes (Signed)
[facility] PHYSICAL MEDICINE & REHABILITATION PROGRESS NOTE   Subjective/Complaints:  No coughing or SOB, Tingling in feet worse at noc   ROS no CP, SOB, N/V/D  Objective:   No results found. Recent Labs    02/20/19 1818 02/21/19 0551  WBC 6.7 6.3  HGB 11.8* 11.3*  HCT 37.6 36.2  PLT 332 331   Recent Labs    02/20/19 0054 02/20/19 1818 02/21/19 0551  NA 136  --  136  K 3.3*  --  4.0  CL 97*  --  100  CO2 26  --  27  GLUCOSE 101*  --  148*  BUN 9  --  6  CREATININE 0.65 0.67 0.69  CALCIUM 8.4*  --  8.2*    Intake/Output Summary (Last 24 hours) at 02/21/2019 0828 Last data filed at 02/20/2019 1847 Gross per 24 hour  Intake 240 ml  Output --  Net 240 ml     Physical Exam: Vital Signs Blood pressure (!) 142/95, pulse (!) 111, temperature 98.3 F (36.8 C), temperature source Oral, resp. rate 20, weight (!) 151.3 kg, SpO2 95 %.   General: No acute distress Mood and affect are appropriate Heart: Regular rate and rhythm no rubs murmurs or extra sounds Lungs: Clear to auscultation, breathing unlabored, no rales or wheezes Abdomen: Positive bowel sounds, soft nontender to palpation, nondistended Extremities: No clubbing, cyanosis, or edema Skin: No evidence of breakdown, no evidence of rash Neurologic: Cranial nerves II through XII intact, motor strength is 5/5 in bilateral deltoid, bicep, tricep, grip, hip flexor, knee extensors, ankle dorsiflexor and plantar flexor Sensory examreuced  sensation to light touch in dorsum of both feet Cerebellar exam normal finger to nose to finger as well as heel to shin in bilateral upper and lower extremities Musculoskeletal: Full range of motion in all 4 extremities. No joint swelling    Assessment/Plan: 1. Functional deficits secondary to debility post resp failure  which require 3+ hours per day of interdisciplinary therapy in a comprehensive inpatient rehab setting.  Physiatrist is providing close team supervision and 24  hour management of active medical problems listed below.  Physiatrist and rehab team continue to assess barriers to discharge/monitor patient progress toward functional and medical goals  Care Tool:  Bathing              Bathing assist       Upper Body Dressing/Undressing Upper body dressing        Upper body assist      Lower Body Dressing/Undressing Lower body dressing            Lower body assist       Toileting Toileting    Toileting assist Assist for toileting: Minimal Assistance - Patient > 75%     Transfers Chair/bed transfer  Transfers assist           Locomotion Ambulation   Ambulation assist              Walk 10 feet activity   Assist           Walk 50 feet activity   Assist           Walk 150 feet activity   Assist           Walk 10 feet on uneven surface  activity   Assist           Wheelchair     Assist  Wheelchair 50 feet with 2 turns activity    Assist            Wheelchair 150 feet activity     Assist          Blood pressure (!) 142/95, pulse (!) 111, temperature 98.3 F (36.8 C), temperature source Oral, resp. rate 20, weight (!) 151.3 kg, SpO2 95 %.   Medical Problem List and Plan: 1.  Debility secondary to acute on chronic respiratory failure with hypoxia related to COVID-19             -patient may shower             -ELOS/Goals: 14-18 days, ModI PT, OT, I in SLP 2.  Antithrombotics: -DVT/anticoagulation: Lovenox.  Doppler studies negative             -antiplatelet therapy: N/A 3. Pain Management/chronic back pain:              Neuropathic pain is severe, constant, and inhibiting her participation in PT and ability to upright in chair during the day. Recommend increasing Gabapentin dose to 365m QID with continued uptitration for better pain control. Can consider higher dose at night as she is not sleeping well.  4. Mood: Provide emotional  support             -antipsychotic agents: N/A 5. Neuropsych: This patient is capable of making decisions on her own behalf. 6. Skin/Wound Care/left ankle wound: Hydrocolloid dressing left ankle daily 7. Fluids/Electrolytes/Nutrition: Routine in and outs with follow-up chemistries 8.  Diabetes mellitus.  Hemoglobin A1c 11.1.  NovoLog 6 units 3 times daily, Levemir 35 units twice daily.  Diabetic teaching 9.  Acute systolic congestive heart failure.  Continue Lasix 40 mg daily.  Monitor for any signs of fluid overload 10.  Morbid obesity.  BMI 47.85.  Dietary follow-up. 11. Education: educated regarding the importance of using the incentive spirometer hourly. Patient is currently achieving 1250. Recommended that she monitor her progress over time.   12.  ID- 26 d post Covid diagnosis [facility] , no trach, >21 d post Covid diagnosis, no C diff Wi;; d/c airborne and contact prec, ran this by pulmonary on call who feels this is reasonable   LOS: 1 days A FACE TO [facility] 02/21/2019, 8:28 AM

## 2019-02-21 NOTE — Evaluation (Signed)
Physical Therapy Assessment and Plan  Patient Details  Name: Anita Hunter MRN: [id number] Date of Birth: 1990-05-21  PT Diagnosis: Abnormality of gait, Difficulty walking, Impaired sensation and Muscle weakness Rehab Potential: Excellent ELOS: 14-16 days   Today's Date: 02/21/2019 PT Individual Time: 0920-1000 and 1300-1400 PT Individual Time Calculation (min): 40 min and 60 min    Problem List:  Patient Active Problem List   Diagnosis Date Noted  . Debility 02/20/2019  . Pressure injury of skin 02/20/2019  . Aspiration pneumonia ([place]) 02/15/2019  . Acute lower UTI 02/15/2019  . Acute on chronic respiratory failure with hypoxia and hypercapnia ([place]) 02/15/2019  . Acute systolic CHF (congestive heart failure) ([place]) 02/15/2019  . ARDS (adult respiratory distress syndrome) ([place])   . Hypertriglyceridemia   . NASH (nonalcoholic steatohepatitis) 01/27/2019  . COVID-19 01/26/2019  . Type 2 diabetes mellitus ([place]) 01/26/2019  . Hypomagnesemia 01/26/2019  . Acute non intractable tension-type headache 04/16/2018  . Encounter for annual physical exam 04/16/2018  . Chronic left-sided low back pain without sciatica 03/19/2018  . Seasonal allergic rhinitis 03/19/2018  . RLQ abdominal pain 10/24/2016  . Obstructive sleep apnea 08/27/2016  . Back pain 06/27/2016  . Prediabetes 03/02/2015  . Sinusitis, chronic 03/25/2014  . Tobacco use disorder 06/14/2012  . Knee pain, left 06/13/2012  . Morbid obesity ([place]) 04/11/2006  . Eczema 04/11/2006    Past Medical History:  Past Medical History:  Diagnosis Date  . Back pain   . Eczema   . Seasonal allergies   . Sleep apnea   . Type 2 diabetes mellitus ([place]) 01/26/2019   Past Surgical History:  Past Surgical History:  Procedure Laterality Date  . NO PAST SURGERIES      Assessment & Plan Clinical Impression: Anita Hunter is a 29 year old right-handed female with history of chronic back pain, morbid obesity with BMI of 47.85,  eczema, sleep apnea, type 2 diabetes mellitus.  Per chart review patient lives with spouse independent prior to admission.  She works at a [employer].  1 level home with multiple stairs to entry.  Presented 01/26/2019 with progressive shortness of breath x2 days with sinus congestion, fatigue, malaise, low-grade fever and chills.  She had a history of exposure to positive COVID-19 2 days before the onset of her symptoms.  In the ED SARS coronavirus positive, glucose 257, hemoglobin A1c 11.1, D-dimer 0.53.  Chest x-ray no active disease however follow-up chest x-ray showed development of bilateral heterogeneous lung opacities consistent with COVID-19 pneumonia.  Patient did require high flow nasal cannula oxygen therapy.  She was placed on prednisone therapy and tapered off as well as antivirals.  Blood culture showing no growth to date.  Patient has been maintained on BiPAP. Bouts of hypokalemia and placed on supplement.  Hospital course complicated by E. coli UTI again antibiotic treatment has been completed.  Patient was treated for acute systolic congestive heart failure maintained on Lasix monitoring hydration.  She is tolerating a regular diet.  Maintain on Lovenox for DVT prophylaxis with venous Doppler studies negative.WOC consult for left ankle wound advised hydrocolloid dressing daily.  Therapy evaluations completed with recommendations of physical medicine rehab consult and patient was admitted for a comprehensive rehab program.  Patient transferred to CIR on 02/20/2019 .   Patient currently requires min with mobility secondary to muscle weakness, decreased cardiorespiratoy endurance and decreased oxygen support, decreased coordination and decreased standing balance, decreased postural control and decreased balance strategies.  Prior to hospitalization, patient was  independent  with mobility and lived with Other (Comment)(lives in apt w/roomate, will be going to parents home at dc) in a [place] home.   Home access is 8Stairs to enter.  She was working at [employer] in a restricted activity position due to recent fall w/back injury. A primary limiting factor at this time is her morbid obesity and she is experiencing neuropathic bilat foot pain.  She is very receptive to training, has youth on her side, and is a seemingly motivated individual w/good family support.  She did not require supplemental 02 on eval, but her sats should be closely monitored w/increasing activity due to her recent hx and dyspnea on exertion w/some drop in sats.  Her biggest challenge for dc to home is the need to safely negotiate 8 stairs w/single rail.  Patient will benefit from skilled PT intervention to maximize safe functional mobility, minimize fall risk and decrease caregiver burden for planned discharge home with 24 hour supervision.  Anticipate patient will benefit from follow up [facility] or possibly OPT at discharge.  PT - End of Session Activity Tolerance: Tolerates 30+ min activity with multiple rests Endurance Deficit: Yes Endurance Deficit Description: multiple seated rest breaks between mobility tasks needed, 02 sats decrease upper 80s to mid 90s/Hr increases w/exertion PT Assessment Rehab Potential (ACUTE/IP ONLY): Excellent PT Barriers to Discharge: [place] home environment;Wound Care;Weight PT Barriers to Discharge Comments: 8STE home, unable to tolerate single stair on eval PT Patient demonstrates impairments in the following area(s): Balance;Edema;Endurance;Motor;Pain;Perception;Safety;Sensory;Skin Integrity PT Transfers Functional Problem(s): Bed Mobility;Bed to Chair;Car;Furniture PT Locomotion Functional Problem(s): Ambulation;Wheelchair Mobility;Stairs PT Plan PT Intensity: Minimum of 1-2 x/day ,45 to 90 minutes PT Frequency: 5 out of 7 days PT Duration Estimated Length of Stay: 14-16 days PT Treatment/Interventions: Ambulation/gait training;DME/adaptive equipment instruction;Neuromuscular  re-education;Stair training;UE/LE Strength taining/ROM;Wheelchair propulsion/positioning;Discharge planning;Pain management;Skin care/wound management;Therapeutic Activities;UE/LE Coordination activities;Functional mobility training;Patient/family education;Splinting/orthotics;Therapeutic Exercise PT Transfers Anticipated Outcome(s): supervision w/LRAD PT Locomotion Anticipated Outcome(s): supervision w/LRAD PT Recommendation Recommendations for Other Services: Therapeutic Recreation consult Therapeutic Recreation Interventions: Other (comment)(based on interests of patient) Follow Up Recommendations: Outpatient PT;Home health PT(tbd) Patient destination: Home Equipment Recommended: To be determined Equipment Details: has none, may need AD, will further assess  Skilled Therapeutic Intervention  Pain:  bilat feet/neuropathic pain, treatment to tolerance  Pt received prone in bed which she states is her most comfortable position even PTA.  Prone to side to sit w/cga and use of rail.  SPT to wc w/RW and min assist, cues for sequencing/safety.  Pt transported to gym for continued PT session.   Pt taken to gym for car transfer assessment, however not safe to perform without AD as she did PTA.  Therefore pt instructed w/car transfer using RW .  Pt then performed car transfer w/RW and min assist as well as verbal cueing for seqencing and safety/set up assist.Pt required approx 3 min rest between getting into and transferring out of car due to dyspnea.  )2 sats 87%, increased gradually w/rest.  Discussed deep breathing.   Discussed proper technique for ascending/descending stairs w/single rail, pt declined attempting due to fatigue.  Discussed concern that this would be her biggest challenge at DC and that it would be biggest factor in determining length of stay.  Pt agreed/voiced understanding.  wc propulsion 82f including turning wc w/verbal cues for efficiency.  Performed for cardiovascular  conditioning. SPT wc to bed w/RW and min assist.  Sit to supine w/min assist for LE's.  Supine therex including hip abd/add, attempted bridging  Supine to sit w/min assist.  Pt requested to sit on EOB w/lunch tray, agreed to call nursing when finished eating.  Discussed w/nursing who agreed.  Also educated NT to obtain [facility] and not to ambulate pt to BR at this time due to decreased 02 sats w/short distance ambulation, RLE weakness, fall risk.  Pt left seated on eob w/lunchtray and bed alarm set.   PT Evaluation Precautions/Restrictions Precautions Precautions: Fall Precaution Comments: monitor 02 sats, HR Restrictions Weight Bearing Restrictions: No General Chart Reviewed: Yes Additional Pertinent History: type 2 DM, hx fall w/back injury Vital Signs Pain Pain Assessment Pain Scale: 0-10 Pain Score: 6  Pain Type: Neuropathic pain Pain Location: Foot(bilat) Pain Orientation: Right;Anterior Pain Descriptors / Indicators: Burning;Tingling Pain Frequency: Constant Pain Onset: On-going Pain Intervention(s): Medication (See eMAR) Home Living/Prior Functioning Home Living Available Help at Discharge: Family;Available 24 hours/day Type of Home: Apartment Home Access: Stairs to enter Entrance Stairs-Number of Steps: 8 Entrance Stairs-Rails: Right;Left(can only reach one at time) Home Layout: One level Bathroom Shower/Tub: Tub/shower unit;Curtain Biochemist, clinical: Standard Bathroom Accessibility: Yes  Lives With: Other (Comment)(lives in apt w/roomate, will be going to parents home at [employer]) Prior Function Level of Independence: Independent with transfers;Independent with gait  Able to Take Stairs?: Yes Driving: Yes Vocation Requirements: worked as Heritage manager at [employer], 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 Comments: Working at [employer];  Vision/Perception     Cognition Overall Cognitive Status: Within Functional Limits for tasks assessed Arousal/Alertness:  Awake/alert Orientation Level: Oriented X4 Attention: Sustained Memory: Appears intact Sensation Sensation Light Touch: Impaired by gross assessment(r foot greater than L) Proprioception: Impaired by gross assessment(r great toe and ankle) Additional Comments: B LE neuropothy Coordination Gross Motor Movements are Fluid and Coordinated: No Coordination and Movement Description: generalized weakness and tremors Heel Shin Test: tremors, limited by weakness and body habitus Motor  Motor Motor: Other (comment) Motor - Skilled Clinical Observations: Generalized weakness or global nature, RLE > L  Mobility Bed Mobility Bed Mobility: Rolling Right;Rolling Left;Supine to Sit;Sit to Supine Rolling Right: Supervision/verbal cueing(using rails) Rolling Left: Supervision/Verbal cueing(using rails) Supine to Sit: Minimal Assistance - Patient > 75%(using rails) Sit to Supine: Minimal Assistance - Patient > 75%(for LE management) Transfers Transfers: Stand Pivot Transfers Stand Pivot Transfers: Minimal Assistance - Patient > 75% Stand Pivot Transfer Details: Verbal cues for sequencing;Verbal cues for technique;Verbal cues for precautions/safety Stand Pivot Transfer Details (indicate cue type and reason): using bariatric RW Transfer (Assistive device): Rolling walker Locomotion  Gait Ambulation: Yes Gait Assistance: Minimal Assistance - Patient > 75% Gait Distance (Feet): 6 Feet Assistive device: Rolling walker Gait Assistance Details: Verbal cues for safe use of DME/AE;Verbal cues for precautions/safety Gait Assistance Details: very short step length, cues for safe progression of walker, posture Gait Gait: Yes Gait Pattern: Impaired Gait velocity: decreased Stairs / Additional Locomotion Stairs: No Wheelchair Mobility Wheelchair Mobility: Yes Wheelchair Assistance: Minimal assistance - Patient >75% Wheelchair Propulsion: Both upper extremities Wheelchair Parts Management: Needs  assistance Distance: 31f/turn  Trunk/Postural Assessment  Cervical Assessment Cervical Assessment: Within Functional Limits Thoracic Assessment Thoracic Assessment: Within Functional Limits Lumbar Assessment Lumbar Assessment: Within Functional Limits Postural Control Postural Control: Deficits on evaluation  Balance Static Sitting Balance Static Sitting - Level of Assistance: 5: Stand by assistance Dynamic Sitting Balance Dynamic Sitting - Level of Assistance: 5: Stand by assistance(able to donn jacket in sitting without balance loss, performs LE therex without balance loss) Static Standing Balance Static Standing - Balance Support:  Bilateral upper extremity supported Static Standing - Level of Assistance: 4: Min assist Dynamic Standing Balance Dynamic Standing - Balance Support: Bilateral upper extremity supported(and RW w/all functional mobility activities) Dynamic Standing - Level of Assistance: 4: Min assist;3: Mod assist(gait/transfers w/RW) Extremity Assessment  RUE Assessment RUE Assessment: Exceptions to [facility] General Strength Comments: see ot eval for full assessment LUE Assessment LUE Assessment: Exceptions to [facility] General Strength Comments: see ot eval for full assessment RLE Assessment RLE Assessment: Exceptions to [facility] Passive Range of Motion (PROM) Comments: DF to neutral Active Range of Motion (AROM) Comments: see strength ankle General Strength Comments: hip flexion 3-/5, extension grossly 2/5 unable to bridge due to weakness, quads 4/5, hams 3-/5, hip adduction 2/5 abd 2+/5, PF 2+, DF trace LLE Assessment LLE Assessment: Exceptions to [facility] Passive Range of Motion (PROM) Comments: wfl Active Range of Motion (AROM) Comments: wfl General Strength Comments: hip flexion 3-, hip abd/add at least 3/5, extension grossly 2/5 unable to bridge, quads 4+, hams grossly 4/5 tested sitting, df 3-/5    Refer to Care Plan for Long Term Goals  Recommendations for other  services: Therapeutic Recreation  Other TBD by therapist  Discharge Criteria: Patient will be discharged from PT if patient refuses treatment 3 consecutive times without medical reason, if treatment goals not met, if there is a change in medical status, if patient makes no progress towards goals or if patient is discharged from hospital.  The above assessment, treatment plan, treatment alternatives and goals were discussed and mutually agreed upon: by patient  Jerrilyn Cairo 02/21/2019, 4:08 PM

## 2019-02-21 NOTE — Progress Notes (Signed)
Occupational Therapy Session Note  Patient Details  Name: Anita Hunter MRN: [id number] Date of Birth: Oct 09, 1990  Today's Date: 02/21/2019 OT Individual Time: 1000-1056 OT Individual Time Calculation (min): 56 min    Short Term Goals: Week 1:     Skilled Therapeutic Interventions/Progress Updates:    1;1. Pt received in w/c agreeable to OT. Pt reported enjoying shower with OT in morning and wants to learn AE to improve indpenence with donning footwear. Pt educated on AE technique to use sock aide, reacher and shoe horn to don/doff socks and shoes. Elastic laces installed in shoes to improve I with donning. Pt able to doff/don socks with AE and MOD VC. Pt requires A to pull up heel in back of R shoe, but successfully uses shoe horn to don L shoe. Pt requires frequent rest breaks d/t fatigue and tingling/pain in feet. Medication already provided. Pt completes 3 sit to stand for OT to adjust height of bariatric RW. Pt able to complete 4 steps forward and backward with VC for sequencing backward walking steps with RW with CGA. Exited session with pt seated in w/c, exit alarm on and call light tin reach  Therapy Documentation Precautions:  Precautions Precautions: Fall Restrictions Weight Bearing Restrictions: No General:   Vital Signs:  Pain:   ADL:   Vision Baseline Vision/History: No visual deficits Perception    Praxis   Exercises:   Other Treatments:     Therapy/Group: Individual Therapy  Tonny Branch 02/21/2019, 10:58 AM

## 2019-02-22 ENCOUNTER — Inpatient Hospital Stay (HOSPITAL_COMMUNITY): Payer: BC Managed Care – PPO

## 2019-02-22 LAB — GLUCOSE, CAPILLARY
Glucose-Capillary: 144 mg/dL — ABNORMAL HIGH (ref 70–99)
Glucose-Capillary: 138 mg/dL — ABNORMAL HIGH (ref 70–99)
Glucose-Capillary: 155 mg/dL — ABNORMAL HIGH (ref 70–99)
Glucose-Capillary: 166 mg/dL — ABNORMAL HIGH (ref 70–99)

## 2019-02-22 MED ORDER — TRAMADOL HCL 50 MG PO TABS
50.0000 mg | ORAL_TABLET | Freq: Four times a day (QID) | ORAL | Status: DC | PRN
  Administered 2019-02-22 – 2019-02-24 (×7): 50 mg via ORAL
  Filled 2019-02-22 (×8): qty 1

## 2019-02-22 MED ORDER — HYDROCODONE-ACETAMINOPHEN 5-325 MG PO TABS
1.0000 | ORAL_TABLET | Freq: Once | ORAL | Status: AC
  Administered 2019-02-22: 1 via ORAL
  Filled 2019-02-22: qty 1

## 2019-02-22 MED ORDER — NORTRIPTYLINE HCL 10 MG PO CAPS
10.0000 mg | ORAL_CAPSULE | Freq: Every day | ORAL | Status: DC
  Administered 2019-02-22 – 2019-02-23 (×2): 10 mg via ORAL
  Filled 2019-02-22 (×2): qty 1

## 2019-02-22 MED ORDER — GABAPENTIN 400 MG PO CAPS
400.0000 mg | ORAL_CAPSULE | Freq: Three times a day (TID) | ORAL | Status: DC
  Administered 2019-02-22 – 2019-02-23 (×5): 400 mg via ORAL
  Filled 2019-02-22 (×5): qty 1

## 2019-02-22 NOTE — Progress Notes (Addendum)
[facility] PHYSICAL MEDICINE & REHABILITATION PROGRESS NOTE   Subjective/Complaints:  Mother at bedside with several questions.  Patient continues to have severe neuropathy pain in the right greater than left lower extremity. The patient had an elevated hemoglobin A1c of 11 at admission but states she did not have any neuropathy symptoms prior to her hospitalization for Covid.  ROS no CP, SOB, N/V/D  Objective:   No results found. Recent Labs    02/20/19 1818 02/21/19 0551  WBC 6.7 6.3  HGB 11.8* 11.3*  HCT 37.6 36.2  PLT 332 331   Recent Labs    02/20/19 0054 02/20/19 1818 02/21/19 0551  NA 136  --  136  K 3.3*  --  4.0  CL 97*  --  100  CO2 26  --  27  GLUCOSE 101*  --  148*  BUN 9  --  6  CREATININE 0.65 0.67 0.69  CALCIUM 8.4*  --  8.2*    Intake/Output Summary (Last 24 hours) at 02/22/2019 0952 Last data filed at 02/21/2019 1844 Gross per 24 hour  Intake 480 ml  Output --  Net 480 ml     Physical Exam: Vital Signs Blood pressure 140/85, pulse (!) 116, temperature 97.8 F (36.6 C), resp. rate 20, weight (!) 151.3 kg, SpO2 96 %.   General: No acute distress Mood and affect are appropriate Heart: Regular rate and rhythm no rubs murmurs or extra sounds Lungs: Clear to auscultation, breathing unlabored, no rales or wheezes Abdomen: Positive bowel sounds, soft nontender to palpation, nondistended Extremities: No clubbing, cyanosis, or edema Skin: No evidence of breakdown, no evidence of rash Neurologic: Cranial nerves II through XII intact, motor strength is 5/5 in bilateral deltoid, bicep, tricep, grip, hip flexor, knee extensors, ankle dorsiflexor and plantar flexor Sensory exam reduced  sensation to light touch in dorsum of both feet Cerebellar exam normal finger to nose to finger as well as heel to shin in bilateral upper and lower extremities Musculoskeletal: Full range of motion in all 4 extremities. No joint swelling    Assessment/Plan: 1.  Functional deficits secondary to debility post resp failure  which require 3+ hours per day of interdisciplinary therapy in a comprehensive inpatient rehab setting.  Physiatrist is providing close team supervision and 24 hour management of active medical problems listed below.  Physiatrist and rehab team continue to assess barriers to discharge/monitor patient progress toward functional and medical goals  Care Tool:  Bathing    Body parts bathed by patient: Right arm, Left arm, Chest, Abdomen, Front perineal area, Right upper leg, Left upper leg, Face   Body parts bathed by helper: Buttocks, Right lower leg, Left lower leg     Bathing assist Assist Level: Moderate Assistance - Patient 50 - 74%     Upper Body Dressing/Undressing Upper body dressing   What is the patient wearing?: Pull over shirt    Upper body assist Assist Level: Minimal Assistance - Patient > 75%    Lower Body Dressing/Undressing Lower body dressing      What is the patient wearing?: Pants     Lower body assist Assist for lower body dressing: Minimal Assistance - Patient > 75%     Toileting Toileting    Toileting assist Assist for toileting: Independent with assistive device Assistive Device Comment: walker   Transfers Chair/bed transfer  Transfers assist     Chair/bed transfer assist level: Minimal Assistance - Patient > 75%     Locomotion Ambulation  Ambulation assist      Assist level: Minimal Assistance - Patient > 75% Assistive device: Walker-rolling Max distance: 6   Walk 10 feet activity   Assist  Walk 10 feet activity did not occur: Safety/medical concerns        Walk 50 feet activity   Assist Walk 50 feet with 2 turns activity did not occur: Safety/medical concerns         Walk 150 feet activity   Assist Walk 150 feet activity did not occur: Safety/medical concerns         Walk 10 feet on uneven surface  activity   Assist Walk 10 feet on uneven  surfaces activity did not occur: Safety/medical concerns         Wheelchair     Assist Will patient use wheelchair at discharge?: No             Wheelchair 50 feet with 2 turns activity    Assist            Wheelchair 150 feet activity     Assist          Blood pressure 140/85, pulse (!) 116, temperature 97.8 F (36.6 C), resp. rate 20, weight (!) 151.3 kg, SpO2 96 %.    Medical Problem List and Plan: 1.  Debility secondary to acute on chronic respiratory failure with hypoxia related to COVID-19             -patient may shower             -ELOS/Goals: 14-18 days, ModI PT, OT, I in SLP 2.  Antithrombotics: -DVT/anticoagulation: Lovenox.  Doppler studies negative             -antiplatelet therapy: N/A 3. Pain Management/chronic back pain:              Neuropathic pain is severe likely related to uncontrolled diabetes with worsening after critical illness, constant, and inhibiting her participation in PT and ability to upright in chair during the day. Recommend increasing Gabapentin dose to 48m QID with continued up titration for better pain control.  Add nortriptyline at night to help with sleep 4. Mood: Provide emotional support             -antipsychotic agents: N/A 5. Neuropsych: This patient is capable of making decisions on her own behalf. 6. Skin/Wound Care/left ankle wound: Hydrocolloid dressing left ankle daily 7. Fluids/Electrolytes/Nutrition: Routine in and outs with follow-up chemistries 8.  Diabetes mellitus.  Hemoglobin A1c 11.1 12/14.  NovoLog 6 units 3 times daily, Levemir 35 units twice daily.  CBG (last 3)  Recent Labs    02/21/19 1648 02/21/19 2128 02/22/19 0738  GLUCAP 137* 174* 144*  diabetes controlled  9.  Acute systolic congestive heart failure.  Continue Lasix 40 mg daily.  Monitor for any signs of fluid overload, ECHO 12/26 Ejfx 45% 10.  Morbid obesity.  BMI 47.85.  Dietary follow-up. 11. Education: educated regarding the  importance of using the incentive spirometer hourly. Patient is currently achieving 1250. Recommended that she monitor her progress over time.   12.  ID- 26 d post Covid diagnosis was on airborne and contact prec [facility] no coughing , no trach, >21 d post Covid diagnosis, no C diff Wi;; d/c airborne and contact prec, ran this by pulmonary on call who feels this is reasonable  13.  Mild anemia baseline ~13 will check stool guaic   Long discussion with patient and daughter greater than 50%  of 35-minute visit LOS: 2 days A FACE TO FACE EVALUATION WAS PERFORMED  Charlett Blake 02/22/2019, 9:52 AM

## 2019-02-22 NOTE — Progress Notes (Signed)
Patient has refused BIPAP HS tonight. Patient states she gets very anxious when she wears it.

## 2019-02-22 NOTE — Progress Notes (Signed)
Physical Therapy Session Note  Patient Details  Name: Anita Hunter MRN: [id number] Date of Birth: Aug 26, 1990  Today's Date: 02/22/2019 PT Individual Time: 9562-1308 PT Individual Time Calculation (min): 30 min    Short Term Goals: Week 1:  PT Short Term Goal 1 (Week 1): Pt will perform bed to wc to bed w/cga consistently w/LRAD PT Short Term Goal 2 (Week 1): pt will ambulate 27f w/RW and cga. PT Short Term Goal 3 (Week 1): pt will perform all bed mobility w/supervision  Skilled Therapeutic Interventions/Progress Updates:    Pt seated in recliner upon PT arrival, agreeable to therapy tx and denies pain. Pt remains on RA throughout session, SpO2 drops to 84-87% with activity, but pt quickly returns to >90% SpO2 following seated rest break with cues for breathing techniques. Limited ambulation distance and activity secondary to dyspnea and drop in SpO2. Pt ambulated x 10 ft with RW and CGA to the w/c, transported to the gym. Pt ambulated 2 x 45 ft this session with RW and CGA, cues for upright posture and breathing techniques. Pt ambulated x 20 ft to the steps and ascended/descended 1 step x 2 this session using B rails and CGA, step to pattern with cues for techniques. Pt transported back to room and propelled w/c within room x 10 ft. Pt left in w/c with needs in reach and chair alarm set.   Therapy Documentation Precautions:  Precautions Precautions: Fall Precaution Comments: monitor 02 sats, HR Restrictions Weight Bearing Restrictions: No    Therapy/Group: Individual Therapy  ENetta Corrigan PT, DPT, CSRS 02/22/2019, 7:55 AM

## 2019-02-23 ENCOUNTER — Inpatient Hospital Stay (HOSPITAL_COMMUNITY): Payer: Self-pay | Admitting: Occupational Therapy

## 2019-02-23 ENCOUNTER — Inpatient Hospital Stay (HOSPITAL_COMMUNITY): Payer: Self-pay

## 2019-02-23 ENCOUNTER — Inpatient Hospital Stay (HOSPITAL_COMMUNITY): Payer: BC Managed Care – PPO

## 2019-02-23 LAB — GLUCOSE, CAPILLARY
Glucose-Capillary: 140 mg/dL — ABNORMAL HIGH (ref 70–99)
Glucose-Capillary: 120 mg/dL — ABNORMAL HIGH (ref 70–99)
Glucose-Capillary: 109 mg/dL — ABNORMAL HIGH (ref 70–99)
Glucose-Capillary: 117 mg/dL — ABNORMAL HIGH (ref 70–99)

## 2019-02-23 MED ORDER — GABAPENTIN 300 MG PO CAPS
300.0000 mg | ORAL_CAPSULE | Freq: Three times a day (TID) | ORAL | Status: DC
  Administered 2019-02-23 – 2019-02-24 (×6): 300 mg via ORAL
  Filled 2019-02-23 (×6): qty 1

## 2019-02-23 MED ORDER — DULOXETINE HCL 20 MG PO CPEP
20.0000 mg | ORAL_CAPSULE | Freq: Every day | ORAL | Status: DC
  Administered 2019-02-23 – 2019-02-28 (×6): 20 mg via ORAL
  Filled 2019-02-23 (×9): qty 1

## 2019-02-23 NOTE — Progress Notes (Signed)
Occupational Therapy Session Note  Patient Details  Name: ANNABELL OCONNOR MRN: [id number] Date of Birth: April 23, 1990  Today's Date: 02/23/2019 OT Individual Time: 1000-1058 OT Individual Time Calculation (min): 58 min    Short Term Goals: Week 1:  OT Short Term Goal 1 (Week 1): LTG=STG 2/2 ELOS  Skilled Therapeutic Interventions/Progress Updates:    Upon entering the room, pt seated in wheelchair awaiting OT arrival. Pt reporting 10/10 neuropathic pain in B feet. RN aware and medication given this session. Pt requesting to use bathroom and stood with supervision from wheelchair. Pt ambulating with RW 10' into bathroom with min guard. While seated, pt doffing clothing items and transferring into shower with min A onto TTB with use of grab bar. Pt remained seated while bathing and utilized LH sponge to increase independence and needing supervision for shower. Pt ambulating with RW, in same manner, to sit on EOB to don clothing items. Pt donning pull over shirt with set up A to obtain needed items. Pt threading pants onto B feet with increased time and min guard. Pt standing with RW for several minutes while RN changed dressing on buttocks. Pt returning to wheelchair at end of session. Chair alarm activated and all needs within reach. Pt remained on RA during session with O2 at 95% or higher. HR increased to 118 bpm during session.   Therapy Documentation Precautions:  Precautions Precautions: Fall Precaution Comments: monitor 02 sats, HR Restrictions Weight Bearing Restrictions: No ADL: ADL Eating: Set up Grooming: Contact guard Upper Body Bathing: Supervision/safety Lower Body Bathing: Moderate assistance Upper Body Dressing: Minimal assistance Lower Body Dressing: Moderate assistance Toileting: Moderate assistance Toilet Transfer: Moderate assistance   Therapy/Group: Individual Therapy  Gypsy Decant 02/23/2019, 12:44 PM

## 2019-02-23 NOTE — Care Management (Signed)
[facility] Individual Statement of Services  Patient Name:  Anita Hunter  Date:  02/23/2019  Welcome to the [facility].  Our goal is to provide you with an individualized program based on your diagnosis and situation, designed to meet your specific needs.  With this comprehensive rehabilitation program, you will be expected to participate in at least 3 hours of rehabilitation therapies Monday-Friday, with modified therapy programming on the weekends.  Your rehabilitation program will include the following services:  Physical Therapy (PT), Occupational Therapy (OT), 24 hour per day rehabilitation nursing, Neuropsychology, Case Management (Social Worker), Rehabilitation Medicine, Nutrition Services and Pharmacy Services  Weekly team conferences will be held on Wednesdays to discuss your progress.  Your Social Worker will talk with you frequently to get your input and to update you on team discussions.  Team conferences with you and your family in attendance may also be held.  Expected length of stay: 7-14 days Overall anticipated outcome: Supervision with bathing, dressing,toileting and ambulation over 100'.  Independent with assistive device for transfers and ambulation in home environment. CGA (touching) with stairs.  Depending on your progress and recovery, your program may change. Your Social Worker will coordinate services and will keep you informed of any changes. Your Social Worker's name and contact numbers are listed  below.  The following services may also be recommended but are not provided by the [facility] will be made to provide these services after discharge if needed.  Arrangements include referral to agencies that provide these services.  Your insurance has been verified  to be:  [employer] Your primary doctor is:  Dr. [name] Pertinent information will be shared with your doctor and your insurance company.  Social Worker:  [place], [place] or ([phone]   Information discussed with and copy given to patient by: Margarito Liner, 02/23/2019, 8:26 AM

## 2019-02-23 NOTE — Progress Notes (Signed)
Nurse Tech reported that while at toilet, patient complained of feet pain and had to abruptly sit back down on toilet seat. Patient denied dizziness/confusion; alert and oriented to time, place, person, situation. Complains of buttocks pain from hard toilet seat along with regular neuropathy pain in feet. Patient repositioned in bed, call light within reach. Will give prn pain medication at next availability.

## 2019-02-23 NOTE — Progress Notes (Signed)
Physical Therapy Session Note  Patient Details  Name: Anita Hunter MRN: [id number] Date of Birth: [birth year]  Today's Date: 02/23/2019 PT Individual Time: 1300-1410 PT Individual Time Calculation (min): 70 min   Short Term Goals: Week 1:  PT Short Term Goal 1 (Week 1): Pt will perform bed to wc to bed w/cga consistently w/LRAD PT Short Term Goal 2 (Week 1): pt will ambulate 19f w/RW and cga. PT Short Term Goal 3 (Week 1): pt will perform all bed mobility w/supervision  Skilled Therapeutic Interventions/Progress Updates:   Pt seated in w/c, crying due to pain bil feet: rated 15/10 per pt.  PT discussed with RN; pt initially refused gabapentin for10 am dose, and finally took it at 12:30.  PT educated pt on importance of keeping up with scheduled meds for neuropathic pain; she voiced understanding. Pt willing to participate.  W/c propulsion x 100' iwht supervision; curs for steering and efficiency.   Gait training with RW on level tile, x 45' with CGA for sit> stand and gait.  R foot drop noted. 2nd bout x 80' including turns with CGA and ACE RLE for foot drop.  Pt reported that ACE wrap was very helpful.  Up/down (3) 6" high iwht bil rails, CGA> min assist with noted  effort by 3rd step, step- to leading iwht LLE.  Descended backwards as pt was fatigued, min assist.  O2 sats 90%, HR 124, on room air.  Strengthening in sitting: bil hip adductor squeezes, x 20 x 1.  PT instructed pt in diaphrgamtic breating and relaxation imagery, with fair carry- over.  At end of session, pt seated in w/c with needs at hand and seat pad alarm set.               Therapy Documentation Precautions:  Precautions Precautions: Fall Precaution Comments: monitor 02 sats, HR Restrictions Weight Bearing Restrictions: No        Therapy/Group: Individual Therapy  Kourtney Terriquez 02/23/2019, 4:23 PM

## 2019-02-23 NOTE — Progress Notes (Signed)
[facility] PHYSICAL MEDICINE & REHABILITATION PROGRESS NOTE   Subjective/Complaints: Patient continues to have severe neuropathy pain in the right greater than left lower extremity. The patient had an elevated hemoglobin A1c of 11 at admission but states she did not have any neuropathy symptoms prior to her hospitalization for Covid. She is tearful this morning due to her pain and says that the Gabapentin made the pain worse, asks for another pain medication.   ROS no CP, SOB, N/V/D  Objective:   No results found. Recent Labs    02/20/19 1818 02/21/19 0551  WBC 6.7 6.3  HGB 11.8* 11.3*  HCT 37.6 36.2  PLT 332 331   Recent Labs    02/20/19 1818 02/21/19 0551  NA  --  136  K  --  4.0  CL  --  100  CO2  --  27  GLUCOSE  --  148*  BUN  --  6  CREATININE 0.67 0.69  CALCIUM  --  8.2*    Intake/Output Summary (Last 24 hours) at 02/23/2019 0912 Last data filed at 02/23/2019 0700 Gross per 24 hour  Intake 1170 ml  Output --  Net 1170 ml     Physical Exam: Vital Signs Blood pressure 127/80, pulse (!) 111, temperature 97.6 F (36.4 C), temperature source Oral, resp. rate 18, weight (!) 151.3 kg, SpO2 90 %. General: No acute distress Mood and affect are appropriate Heart: Regular rate and rhythm no rubs murmurs or extra sounds Lungs: Clear to auscultation, breathing unlabored, no rales or wheezes Abdomen: Positive bowel sounds, soft nontender to palpation, nondistended Extremities: No clubbing, cyanosis, or edema Skin: No evidence of breakdown, no evidence of rash Neurologic: Cranial nerves II through XII intact, motor strength is 5/5 in bilateral deltoid, bicep, tricep, grip, hip flexor, knee extensors, ankle dorsiflexor and plantar flexor Sensory exam reduced  sensation to light touch in dorsum of both feet Cerebellar exam normal finger to nose to finger as well as heel to shin in bilateral upper and lower extremities Musculoskeletal: Full range of motion in all 4  extremities. No joint swelling  Assessment/Plan: 1. Functional deficits secondary to debility post resp failure  which require 3+ hours per day of interdisciplinary therapy in a comprehensive inpatient rehab setting.  Physiatrist is providing close team supervision and 24 hour management of active medical problems listed below.  Physiatrist and rehab team continue to assess barriers to discharge/monitor patient progress toward functional and medical goals  Care Tool:  Bathing    Body parts bathed by patient: Right arm, Left arm, Chest, Abdomen, Front perineal area, Buttocks, Right upper leg, Left upper leg, Right lower leg, Left lower leg, Face   Body parts bathed by helper: Buttocks, Right lower leg, Left lower leg     Bathing assist Assist Level: Set up assist     Upper Body Dressing/Undressing Upper body dressing   What is the patient wearing?: Pull over shirt    Upper body assist Assist Level: Minimal Assistance - Patient > 75%    Lower Body Dressing/Undressing Lower body dressing      What is the patient wearing?: Underwear/pull up     Lower body assist Assist for lower body dressing: Minimal Assistance - Patient > 75%     Toileting Toileting    Toileting assist Assist for toileting: Independent with assistive device Assistive Device Comment: walker   Transfers Chair/bed transfer  Transfers assist     Chair/bed transfer assist level: Contact Guard/Touching assist  Locomotion Ambulation   Ambulation assist      Assist level: Contact Guard/Touching assist Assistive device: Walker-rolling Max distance: 45 ft   Walk 10 feet activity   Assist  Walk 10 feet activity did not occur: Safety/medical concerns  Assist level: Contact Guard/Touching assist Assistive device: Walker-rolling   Walk 50 feet activity   Assist Walk 50 feet with 2 turns activity did not occur: Safety/medical concerns         Walk 150 feet activity   Assist Walk  150 feet activity did not occur: Safety/medical concerns         Walk 10 feet on uneven surface  activity   Assist Walk 10 feet on uneven surfaces activity did not occur: Safety/medical concerns         Wheelchair     Assist Will patient use wheelchair at discharge?: No             Wheelchair 50 feet with 2 turns activity    Assist            Wheelchair 150 feet activity     Assist          Blood pressure 127/80, pulse (!) 111, temperature 97.6 F (36.4 C), temperature source Oral, resp. rate 18, weight (!) 151.3 kg, SpO2 90 %.    Medical Problem List and Plan: 1.  Debility secondary to acute on chronic respiratory failure with hypoxia related to COVID-19             -patient may shower             -ELOS/Goals: 14-18 days, ModI PT, OT, I in SLP 2.  Antithrombotics: -DVT/anticoagulation: Lovenox.  Doppler studies negative             -antiplatelet therapy: N/A 3. Pain Management/chronic back pain:              Neuropathic pain is severe likely related to uncontrolled diabetes with worsening after critical illness, constant, and inhibiting her participation in PT and ability to upright in chair during the day. Recommend increasing Gabapentin dose to 458m QID with continued up titration for better pain control.  Add nortriptyline at night to help with sleep  1/11: Patient states that Gabapentin is making her pain worse. Will try Cymbalta for neuropathic pain and downtitrate Gabapentin dose.  4. Mood: Provide emotional support             -antipsychotic agents: N/A 5. Neuropsych: This patient is capable of making decisions on her own behalf. 6. Skin/Wound Care/left ankle wound: Hydrocolloid dressing left ankle daily 7. Fluids/Electrolytes/Nutrition: Routine in and outs with follow-up chemistries 8.  Diabetes mellitus.  Hemoglobin A1c 11.1 12/14.  NovoLog 6 units 3 times daily, Levemir 35 units twice daily.  CBG (last 3)  Recent Labs     02/22/19 1650 02/22/19 2121 02/23/19 0638  GLUCAP 155* 166* 140*  diabetes controlled  9.  Acute systolic congestive heart failure.  Continue Lasix 40 mg daily.  Monitor for any signs of fluid overload, ECHO 12/26 Ejfx 45% 10.  Morbid obesity.  BMI 47.85.  Dietary follow-up. 11. Education: educated regarding the importance of using the incentive spirometer hourly. Patient is currently achieving 1250. Recommended that she monitor her progress over time.   12.  ID- 26 d post Covid diagnosis [facility] , no trach, >21 d post Covid diagnosis, no C diff Wi;; d/c airborne and contact prec, ran this  by pulmonary on call who feels this is reasonable  13.  Mild anemia baseline ~13 will check stool guaic   1/11: Hgb 11.8 to 11.3 today.   LOS: 3 days A FACE TO FACE EVALUATION WAS PERFORMED  Laiklynn Raczynski P Brissia Delisa 02/23/2019, 9:12 AM

## 2019-02-23 NOTE — Progress Notes (Signed)
Pt refusing cpap for the night. RT will continue to monitor as needed. 

## 2019-02-23 NOTE — IPOC Note (Signed)
Overall Plan of Care [facility]) Patient Details Name: ODALIZ MCQUEARY MRN: [id number] DOB: December 18, 1990  Admitting Diagnosis: Debility  Hospital Problems: Principal Problem:   Debility Active Problems:   Pressure injury of skin     Functional Problem List: Nursing Bladder, Bowel, Skin Integrity, Pain, Endurance, Medication Management, Motor  PT Balance, Edema, Endurance, Motor, Pain, Perception, Safety, Sensory, Skin Integrity  OT Balance, Endurance, Motor, Pain, Sensory  SLP    TR         Basic ADL's: OT Grooming, Bathing, Dressing, Toileting     Advanced  ADL's: OT       Transfers: PT Bed Mobility, Bed to Chair, Car, Manufacturing systems engineer, Metallurgist: PT Ambulation, Emergency planning/management officer, Stairs     Additional Impairments: OT None  SLP        TR      Anticipated Outcomes Item Anticipated Outcome  Self Feeding Independent  Set designer Transfers Supervision  Bowel/Bladder  Pt will manage bowel and bladder with min assist  Transfers  supervision w/LRAD  Locomotion  supervision w/LRAD  Communication     Cognition     Pain  Pt will manage pain at 3 or less on a scale of 0-10.  Safety/Judgment  Pt will remain free of falls with injury while in rehab with min assist   Therapy Plan: PT Intensity: Minimum of 1-2 x/day ,45 to 90 minutes PT Frequency: 5 out of 7 days PT Duration Estimated Length of Stay: 14-16 days OT Intensity: Minimum of 1-2 x/day, 45 to 90 minutes OT Frequency: 5 out of 7 days OT Duration/Estimated Length of Stay: 7-10 days     Due to the current state of emergency, patients may not be receiving their 3-hours of Medicare-mandated therapy.   Team Interventions: Nursing Interventions Patient/Family Education, Bladder Management, Bowel Management, Disease Management/Prevention, Medication Management, Skin Care/Wound Management, Discharge Planning  PT  interventions Ambulation/gait training, DME/adaptive equipment instruction, Neuromuscular re-education, Stair training, UE/LE Strength taining/ROM, Wheelchair propulsion/positioning, Discharge planning, Pain management, Skin care/wound management, Therapeutic Activities, UE/LE Coordination activities, Functional mobility training, Patient/family education, Splinting/orthotics, Therapeutic Exercise  OT Interventions Balance/vestibular training, Community reintegration, Discharge planning, Disease mangement/prevention, DME/adaptive equipment instruction, Functional mobility training, Pain management, Patient/family education, Psychosocial support, Self Care/advanced ADL retraining, Skin care/wound managment, Therapeutic Activities, Therapeutic Exercise, UE/LE Coordination activities, UE/LE Strength taining/ROM  SLP Interventions    TR Interventions    SW/CM Interventions Discharge Planning, Disease Management/Prevention, Psychosocial Support, Patient/Family Education   Barriers to Discharge MD  Medical stability  Nursing      PT Inaccessible home environment, Wound Care, Weight 8STE home, unable to tolerate single stair on eval  OT      SLP      SW       Team Discharge Planning: Destination: PT-Home ,OT- Home , SLP-  Projected Follow-up: PT-Outpatient PT, Home health PT(tbd), OT-  Home health OT, SLP-  Projected Equipment Needs: PT-To be determined, OT- To be determined, SLP-  Equipment Details: PT-has none, may need AD, will further assess, OT-  Patient/family involved in discharge planning: PT- Patient,  OT-Patient, SLP-   MD ELOS: 2-3 weeks Medical Rehab Prognosis:  Good Assessment: Mrs. Baldo therapy is still negatively impacted by uncontrolled, severe peripheral neuropathy, which is likely 2/2 uncontrolled diabetes mellitus type 2 exacerbated by COVID-19. We are trialing neuropathic agents for better control of this pain so patient will  be better able to tolerate therapy.      See Team Conference Notes for weekly updates to the plan of care

## 2019-02-23 NOTE — Plan of Care (Signed)
  Problem: Consults Goal: RH GENERAL PATIENT EDUCATION Description: See Patient Education module for education specifics. Outcome: Progressing   Problem: RH BOWEL ELIMINATION Goal: RH STG MANAGE BOWEL WITH ASSISTANCE Description: STG Manage Bowel with Min Assistance. Outcome: Progressing   Problem: RH BLADDER ELIMINATION Goal: RH STG MANAGE BLADDER WITH ASSISTANCE Description: STG Manage Bladder With Min Assistance Outcome: Progressing Goal: RH STG MANAGE BLADDER WITH MEDICATION WITH ASSISTANCE Description: STG Manage Bladder With Medication With [place]. Outcome: Progressing   Problem: RH SKIN INTEGRITY Goal: RH STG SKIN FREE OF INFECTION/BREAKDOWN Description: No new breakdown with min assist  Outcome: Progressing Goal: RH STG ABLE TO PERFORM INCISION/WOUND CARE W/ASSISTANCE Description: STG Able To Perform Incision/Wound Care With [employer]. Outcome: Progressing   Problem: RH PAIN MANAGEMENT Goal: RH STG PAIN MANAGED AT OR BELOW PT'S PAIN GOAL Description: < 3 out of 10.  Outcome: Progressing

## 2019-02-23 NOTE — Care Management (Signed)
Patient Details  Name: Anita Hunter MRN: [id number] Date of Birth: 29-May-1990  Today's Date: 02/23/2019  Problem List:  Patient Active Problem List   Diagnosis Date Noted  . Debility 02/20/2019  . Pressure injury of skin 02/20/2019  . Aspiration pneumonia ([place]) 02/15/2019  . Acute lower UTI 02/15/2019  . Acute on chronic respiratory failure with hypoxia and hypercapnia ([place]) 02/15/2019  . Acute systolic CHF (congestive heart failure) ([place]) 02/15/2019  . ARDS (adult respiratory distress syndrome) ([place])   . Hypertriglyceridemia   . NASH (nonalcoholic steatohepatitis) 01/27/2019  . COVID-19 01/26/2019  . Type 2 diabetes mellitus ([place]) 01/26/2019  . Hypomagnesemia 01/26/2019  . Acute non intractable tension-type headache 04/16/2018  . Encounter for annual physical exam 04/16/2018  . Chronic left-sided low back pain without sciatica 03/19/2018  . Seasonal allergic rhinitis 03/19/2018  . RLQ abdominal pain 10/24/2016  . Obstructive sleep apnea 08/27/2016  . Back pain 06/27/2016  . Prediabetes 03/02/2015  . Sinusitis, chronic 03/25/2014  . Tobacco use disorder 06/14/2012  . Knee pain, left 06/13/2012  . Morbid obesity ([place]) 04/11/2006  . Eczema 04/11/2006   Past Medical History:  Past Medical History:  Diagnosis Date  . Back pain   . Eczema   . Seasonal allergies   . Sleep apnea   . Type 2 diabetes mellitus ([place]) 01/26/2019   Past Surgical History:  Past Surgical History:  Procedure Laterality Date  . NO PAST SURGERIES     Social History:  reports that she has never smoked. She has never used smokeless tobacco. She reports that she does not drink alcohol or use drugs.  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 [employer] of Employment: 7 Return to Work Plans: When cleared to work   Abuse/Neglect Abuse/Neglect Assessment Can Be Completed: Yes Physical Abuse: Denies Verbal Abuse: Denies Sexual Abuse: Denies Exploitation of patient/patient's resources: Denies Self-Neglect: Denies  Emotional Status Pt's affect, behavior and adjustment status: Normal mood, restricted affect  Patient / Family Perceptions, Expectations & Goals Pt/Family understanding of illness & functional limitations: The patient appears to have good understanding of current situation Premorbid pt/family roles/activities: Independent prior to admission, living in apartment with friend and working Anticipated changes in roles/activities/participation: The patient will discharge to home with mother and step-father until cleared to be independent Pt/family expectations/goals: The patient wants to be as independent as possible and return to usual routine  [employer] available at discharge: Step-father will be able to provide transportation at discharge during the day and both mother and step-father will be available after work hours  Discharge Planning Living Arrangements: Parent Support Systems: Parent, Other relatives, Friends/neighbors Type of Residence: Private residence Does the patient have any problems obtaining your medications?: No Home Management: The patient was independent prior to admission. Mother will be able to help with cooking, cleaning and laundry until patient able to return to normal routine Patient/Family Preliminary Plans: Discharge home with mother and step-father until cleared to be independent in own apartment Social Work Anticipated Follow Up Needs: HH/OP Expected length of stay: 7-14 days  Clinical  Impression The patient is a pleasant young woman who expressed she is "ready  to go home; been here (in the hospital) long enough. Currently experiencing pain/burning in her feet for which the MD is adjusting medication. Appears to have a good understanding of her current health situation and functional limitations. Asking appropriate questions regarding discharge and time projections regarding release to own home and ability to return to work.  Dorien Chihuahua B 02/23/2019, 9:00 AM

## 2019-02-23 NOTE — Progress Notes (Signed)
Pt is having c/o a "burning sensation" in both feet. Pt has refused Scheduled Gabapentin 414m. Pt states "Gabapentin doesn't work for me". Spoke with DLinna Hoff Pa/ Verbal order to give another tramadol 538mOnly if Pt agrees to take Gabapentin. Will continue to monitor. OlAmanda CockayneLPN

## 2019-02-23 NOTE — Progress Notes (Signed)
Inpatient Rehabilitation  Patient information reviewed and entered into eRehab system by Jovonte Commins M. Akira Adelsberger, M.A., CCC/SLP, PPS Coordinator.  Information including medical coding, functional ability and quality indicators will be reviewed and updated through discharge.    

## 2019-02-23 NOTE — Progress Notes (Signed)
Physical Therapy Session Note  Patient Details  Name: Anita Hunter MRN: [id number] Date of Birth: 12-15-1990  Today's Date: 02/23/2019 PT Individual Time: 6979-4801 PT Individual Time Calculation (min): 58 min    Short Term Goals: Week 1:  PT Short Term Goal 1 (Week 1): Pt will perform bed to wc to bed w/cga consistently w/LRAD PT Short Term Goal 2 (Week 1): pt will ambulate 7f w/RW and cga. PT Short Term Goal 3 (Week 1): pt will perform all bed mobility w/supervision  Skilled Therapeutic Interventions/Progress Updates:    Pt supine in bed upon PT arrival, agreeable to therapy tx and reports pain in B feet 10/10 described as burning, tingling, sharp pain. Pt transferred to sitting EOB independently and performed stand pivot to w/c with min assist. Pt transported to the gym in w/c. At rest on RA pt with SpO2 94% and HR 116 bpm. Therapist donned R PLS AFO to help with foot clearance during gait. Pt ambulated x 90 ft, x 45 ft and x 50 ft with RW and supervision, R AFO, working on endurance and gait - SpO2 85-90% following activity, HR 118-130 bpm following activity. Pt requesting to work on stretching, with use of belt pt performed calf stretching and hamstring stretching bilaterally 2 x 30 sec holds. Pt ambulated x 10 ft to w/c with RW and supervision, transported to the dayroom. Pt attempted to use nustep this session however too painful for her feet. Pt used kinetron in sitting instead x 1-2 min bouts on resistance level 20 cm/sec, SpO2 87-89% with this activity. Pt performed 2 x 5 sit<>stands this session from elevated seat with CGA working on balance and LE strength, SpO2 88% after activity. Pt worked on static standing balance without UE support while performing horseshoe toss activity, pt reports she is somewhat fearful when she does not have her RW, CGA. Pt transported back to room, ambulated x 10 ft to bed with RW and supervision. Pt doffed pants and donned shorts with min assist to loop R  LE through, sit<>Stand to pull over hips. Pt left supine in bed at end of session with needs in reach and chair alarm set.   Therapy Documentation Precautions:  Precautions Precautions: Fall Precaution Comments: monitor 02 sats, HR Restrictions Weight Bearing Restrictions: No    Therapy/Group: Individual Therapy  ENetta Corrigan PT, DPT, CSRS 02/23/2019, 8:04 AM

## 2019-02-24 ENCOUNTER — Inpatient Hospital Stay (HOSPITAL_COMMUNITY): Payer: BC Managed Care – PPO

## 2019-02-24 ENCOUNTER — Inpatient Hospital Stay (HOSPITAL_COMMUNITY): Payer: BC Managed Care – PPO | Admitting: Occupational Therapy

## 2019-02-24 ENCOUNTER — Inpatient Hospital Stay (HOSPITAL_COMMUNITY): Payer: BC Managed Care – PPO | Admitting: Physical Therapy

## 2019-02-24 LAB — GLUCOSE, CAPILLARY
Glucose-Capillary: 116 mg/dL — ABNORMAL HIGH (ref 70–99)
Glucose-Capillary: 154 mg/dL — ABNORMAL HIGH (ref 70–99)
Glucose-Capillary: 142 mg/dL — ABNORMAL HIGH (ref 70–99)

## 2019-02-24 MED ORDER — NORTRIPTYLINE HCL 25 MG PO CAPS
25.0000 mg | ORAL_CAPSULE | Freq: Every day | ORAL | Status: DC
  Administered 2019-02-24: 25 mg via ORAL
  Filled 2019-02-24: qty 1

## 2019-02-24 MED ORDER — LORAZEPAM 0.5 MG PO TABS: 0.5000 mg | ORAL_TABLET | Freq: Four times a day (QID) | ORAL | Status: DC | PRN

## 2019-02-24 MED ORDER — HYDROCODONE-ACETAMINOPHEN 5-325 MG PO TABS
1.0000 | ORAL_TABLET | Freq: Four times a day (QID) | ORAL | Status: DC | PRN
  Administered 2019-02-24 – 2019-02-25 (×3): 1 via ORAL
  Filled 2019-02-24 (×4): qty 1

## 2019-02-24 NOTE — Plan of Care (Signed)
  Problem: Consults Goal: RH GENERAL PATIENT EDUCATION Description: See Patient Education module for education specifics. Outcome: Progressing   Problem: RH BOWEL ELIMINATION Goal: RH STG MANAGE BOWEL WITH ASSISTANCE Description: STG Manage Bowel with Min Assistance. Outcome: Progressing   Problem: RH BLADDER ELIMINATION Goal: RH STG MANAGE BLADDER WITH ASSISTANCE Description: STG Manage Bladder With Min Assistance Outcome: Progressing Goal: RH STG MANAGE BLADDER WITH MEDICATION WITH ASSISTANCE Description: STG Manage Bladder With Medication With [place]. Outcome: Progressing   Problem: RH SKIN INTEGRITY Goal: RH STG SKIN FREE OF INFECTION/BREAKDOWN Description: No new breakdown with min assist  Outcome: Progressing Goal: RH STG ABLE TO PERFORM INCISION/WOUND CARE W/ASSISTANCE Description: STG Able To Perform Incision/Wound Care With [employer]. Outcome: Progressing   Problem: RH PAIN MANAGEMENT Goal: RH STG PAIN MANAGED AT OR BELOW PT'S PAIN GOAL Description: < 3 out of 10.  Outcome: Progressing

## 2019-02-24 NOTE — Progress Notes (Signed)
Occupational Therapy Session Note  Patient Details  Name: POPPY MCAFEE MRN: [id number] Date of Birth: 1990/02/16  Today's Date: 02/24/2019 OT Individual Time: 0930-1030 OT Individual Time Calculation (min): 60 min    Short Term Goals: Week 1:  OT Short Term Goal 1 (Week 1): LTG=STG 2/2 ELOS Week 2:     Skilled Therapeutic Interventions/Progress Updates:    1:1 Participated in self care retraining at shower level. Pt sitting EOB when arrived. Ambulated in to the  Bathroom with RW with min A at slow pace with right foot drop present. TP able to perform toileting with min A - with removal of pants. Pt transitioned in to shower and can perform bathing with setup sit to stand with grab bar and long handled sponge. Pt reports ongoing pain in bilateral LEs through session; pain present with rest with them elevated and with weight bearing. Orders written for TEDS- pt not going to tolerate donning them today. Ace wrapped toes to knee today in figure eight pattern and shoes donned with total A. Min A to don pants. Pt ambulated from her room to the RN station before requesting to sit due to pain.   Performed 8 min on Sci Fit with increasing resistive levels 1 to 3 to 5 in 2 min intervals.  With activity pt't o2 decr to 86% but with brief period of rest comes back up ot 93%. Hr continues to be elevated with activity 110-125 bpm.  Pt left resting in the w/c at end of session.  Therapy Documentation Precautions:  Precautions Precautions: Fall Precaution Comments: monitor 02 sats, HR Restrictions Weight Bearing Restrictions: No Pain: Pain Assessment Pain Scale: 0-10 Pain Score: 10-Worst pain ever Faces Pain Scale: Hurts worst Pain Type: Neuropathic pain Pain Location: Foot Pain Orientation: Right;Left Pain Descriptors / Indicators: Aching;Burning;Discomfort;Pins and needles;Throbbing;Stabbing Patients Stated Pain Goal: 4 Pain Intervention(s): Medication (See eMAR) rest when can and  distracting with conversations   Therapy/Group: Individual Therapy  Willeen Cass [facility] 02/24/2019, 10:49 AM

## 2019-02-24 NOTE — Progress Notes (Signed)
Patient has been assisted up to the BR several time this shift and cooperatives,very tearful at interval secondary to neuropathy pain in her feet She states her feet are extremely sensitive to touch,,with throbbing, and aching sensation which making it difficult to sleep or rest) Patient had received her Tramadol and Neurontin earlier this shift and now Tylenol per orders, Reassurance and support provided, patient is up and down from her bed to the recliner attempting to find some comfort, assistance by staff and monitor

## 2019-02-24 NOTE — Progress Notes (Signed)
Physical Therapy Session Note  Patient Details  Name: Anita Hunter MRN: [id number] Date of Birth: Jan 31, 1991  Today's Date: 02/24/2019 PT Individual Time: 8828-0034 and 9179-1505 PT Individual Time Calculation (min): 68 min and 26 min  Short Term Goals: Week 1:  PT Short Term Goal 1 (Week 1): Pt will perform bed to wc to bed w/cga consistently w/LRAD PT Short Term Goal 2 (Week 1): pt will ambulate 49f w/RW and cga. PT Short Term Goal 3 (Week 1): pt will perform all bed mobility w/supervision  Skilled Therapeutic Interventions/Progress Updates:    Session 1: Pt received asleep, sidelying in bed and upon awakening agreeable to therapy session. Patient on RA throughout session. Sidelying>sitting EOB using bedrails with close supervision and increased time. Sitting EOB donned B shoes max assist for time management. SpO2 and HR monitored throughout session. At beginning of session: SpO2 88% and HR 115bpm Stand pivot EOB>w/c using RW with CGA for steadying. SpO2 94% and HR 112bpm Ambulated 1045fusing RW with CGA for steadying and w/c follow in event of fatigue - no AFO used on R LE at this time with pt demonstrating more of a midfoot initial contact and slight foot slap but no toe drag during swing. SpO2 85% and HR 127bpm recovering to 95% and 110bpm prior to next activity. Ascended/descended 4 steps using B HR with CGA/min assist with step-to pattern in both directions leading with L LE on ascent and R LE on forward descent. SpO2 91% and HR 130bpm recovering to 98% and 110bpm. Pt reporting 20/10 pain in B LE with burning pain in L LE - pt reporting all medication administered at this time - RN notified of pt's increased pain and therapist discussed pain with PA. Pt performed overall activity tolerance using arm baike against level 3 resistance for 6 minutes (51f36fard/3back) totaling 0.80 with SpO2 95% and HR 121bpm. Pt continues to report increased B foot pain and defers ambulation at this time.  Performed standing tolerance task using dynavision targeting balance without B UE support and CGA for steadying x3 minutes with average reaction time of 1.04seconds. Transported back to room and pt requesting to use bathroom. Gait ~38f41f to/from bathroom using RW with CGA for steadying. Sit<>stand on/off toilet using grab bar and RW with CGA for steadying. Sit>supine in bed with CGA for steadying and pt using HOB elevated and bedrails to assist with bed mobility.. Pt left supine in bed with needs in reach and bed alarm on.  Session 2: Pt received sitting EOB and agreeable to therapy session. Pt on room air throughout session. Pt requesting to use bathroom. Gait ~38ft69fto/from bathroom using RW with CGA for steadying. Sit<>stand on/off toilet using RW and grab bar with CGA for steadying. Pt continent of bladder and bowels - performed LB clothing management without assist and peri-care with set-up assist. SpO2 89% and HR 122bpm Performed bed mobility training with HOB flat, not using bedrails, and height of bed to replicate home environment - therapist provided max sequential cuing for scooting hips backwards onto the bed and logroll technique to increase pt independence with her demonstrating understanding and performing task with supervision - x1 trial in each direction. Stand pivot EOB>w/c RW close but not using with CGA for steadying. Transported to/from gym in w/c. SpO2 92% and HR 110bpm Ascended/descended 4 steps (6" height) using B HRs with CGA for steadying and pt demonstrating proper set-to pattern. SpO2 87% and HR 127bpm recovering to 91% and 110bpm. Transported back  to room. Stand pivot w/c>EOB, no AD but using bedrails, with CGA for steadying. Pt left seated EOB with needs in reach and bed alarm on.  Therapy Documentation Precautions:  Precautions Precautions: Fall Precaution Comments: monitor 02 sats, HR Restrictions Weight Bearing Restrictions: No  Pain: Session 1: Reports pain level of  20/10 in B feet due to neuropathic pain described as burning - RN and PA notified - rest break, repositioning, and emotional support throughout for pain management.   Session 2: Reports pain in B feet but does not rate and it does not interfere with therapy session.    Therapy/Group: Individual Therapy  Tawana Scale, PT, DPT 02/24/2019, 11:04 AM

## 2019-02-24 NOTE — Progress Notes (Signed)
[facility] PHYSICAL MEDICINE & REHABILITATION PROGRESS NOTE   Subjective/Complaints: Pt is tearful about burning pain in feet this am , no trauma to feet, poor sleep due to pain   ROS no CP, SOB, N/V/D  Objective:   No results found. No results for input(s): WBC, HGB, HCT, PLT in the last 72 hours. No results for input(s): NA, K, CL, CO2, GLUCOSE, BUN, CREATININE, CALCIUM in the last 72 hours.  Intake/Output Summary (Last 24 hours) at 02/24/2019 3419 Last data filed at 02/24/2019 0900 Gross per 24 hour  Intake 360 ml  Output 900 ml  Net -540 ml     Physical Exam: Vital Signs Blood pressure 140/90, pulse (!) 105, temperature 97.8 F (36.6 C), temperature source Oral, resp. rate 18, weight (!) 151.3 kg, SpO2 92 %. General: No acute distress Mood and affect are appropriate Heart: Regular rate and rhythm no rubs murmurs or extra sounds Lungs: Clear to auscultation, breathing unlabored, no rales or wheezes Abdomen: Positive bowel sounds, soft nontender to palpation, nondistended Extremities: No clubbing, cyanosis, or edema, normal pedal pulses, feet are warm Skin: No evidence of breakdown, no evidence of rash No ulceration or skin color change in feet  Neurologic: Cranial nerves II through XII intact, motor strength is 5/5 in bilateral deltoid, bicep, tricep, grip, hip flexor, knee extensors, ankle dorsiflexor and plantar flexor Sensory exam reduced  sensation to light touch in dorsum of both feet Cerebellar exam normal finger to nose to finger as well as heel to shin in bilateral upper and lower extremities Musculoskeletal: Full range of motion in all 4 extremities. No joint swelling  Assessment/Plan: 1. Functional deficits secondary to debility post resp failure  which require 3+ hours per day of interdisciplinary therapy in a comprehensive inpatient rehab setting.  Physiatrist is providing close team supervision and 24 hour management of active medical problems listed  below.  Physiatrist and rehab team continue to assess barriers to discharge/monitor patient progress toward functional and medical goals  Care Tool:  Bathing    Body parts bathed by patient: Right arm, Left arm, Chest, Abdomen, Front perineal area, Buttocks, Right upper leg, Left upper leg, Right lower leg, Left lower leg, Face   Body parts bathed by helper: Buttocks, Right lower leg, Left lower leg     Bathing assist Assist Level: Supervision/Verbal cueing     Upper Body Dressing/Undressing Upper body dressing   What is the patient wearing?: Pull over shirt    Upper body assist Assist Level: Set up assist    Lower Body Dressing/Undressing Lower body dressing      What is the patient wearing?: Pants     Lower body assist Assist for lower body dressing: Minimal Assistance - Patient > 75%     Toileting Toileting    Toileting assist Assist for toileting: Minimal Assistance - Patient > 75% Assistive Device Comment: walker   Transfers Chair/bed transfer  Transfers assist     Chair/bed transfer assist level: Minimal Assistance - Patient > 75%     Locomotion Ambulation   Ambulation assist      Assist level: Supervision/Verbal cueing Assistive device: Walker-rolling Max distance: 90 ft   Walk 10 feet activity   Assist  Walk 10 feet activity did not occur: Safety/medical concerns  Assist level: Supervision/Verbal cueing Assistive device: Walker-rolling, Orthosis   Walk 50 feet activity   Assist Walk 50 feet with 2 turns activity did not occur: Safety/medical concerns  Assist level: Supervision/Verbal cueing Assistive device: Orthosis, Walker-rolling  Walk 150 feet activity   Assist Walk 150 feet activity did not occur: Safety/medical concerns         Walk 10 feet on uneven surface  activity   Assist Walk 10 feet on uneven surfaces activity did not occur: Safety/medical concerns         Wheelchair     Assist Will patient use  wheelchair at discharge?: No Type of Wheelchair: Manual    Wheelchair assist level: Supervision/Verbal cueing Max wheelchair distance: 100    Wheelchair 50 feet with 2 turns activity    Assist        Assist Level: Supervision/Verbal cueing   Wheelchair 150 feet activity     Assist          Blood pressure 140/90, pulse (!) 105, temperature 97.8 F (36.6 C), temperature source Oral, resp. rate 18, weight (!) 151.3 kg, SpO2 92 %.    Medical Problem List and Plan: 1.  Debility secondary to acute on chronic respiratory failure with hypoxia related to COVID-19             -patient may shower             -ELOS/Goals: 14-18 days, ModI PT, OT, I in SLP 2.  Antithrombotics: -DVT/anticoagulation: Lovenox.  Doppler studies negative             -antiplatelet therapy: N/A 3. Pain Management/chronic back pain:              Neuropathic pain is severe likely related to uncontrolled diabetes with worsening after critical illness, constant, and inhibiting her participation in PT , inhibits sleep, has anxiety component as well  No significant relief with gabapentin up to 16oomg per day, may benefit from higher dose but pt would like to try another med Started cymbalta 63m yesterday Also on nortriptyline 165mqhs , no help with sleep or pain, increase to 2511mo relief with tramadol 21m7mt had some "soothing of pain" with hydrocodone 5mg 63m1 x dose, we discussed resp depression with this med but this can be monitored in hospital setting  Will start hydrocodone 5mg Q18mrn in place of tramadol  4. Mood: Provide emotional support- will need neuropsych eval              -antipsychotic agents: N/A 5. Neuropsych: This patient is capable of making decisions on her own behalf. 6. Skin/Wound Care/left ankle wound: Hydrocolloid dressing left ankle daily 7. Fluids/Electrolytes/Nutrition: Routine in and outs with follow-up chemistries 8.  Diabetes mellitus.  Hemoglobin A1c 11.1 12/14.   NovoLog 6 units 3 times daily, Levemir 35 units twice daily.  CBG (last 3)  Recent Labs    02/23/19 1150 02/23/19 1640 02/23/19 2029  GLUCAP 120* 109* 117*  diabetes controlled  9.  Acute systolic congestive heart failure.  Continue Lasix 40 mg daily.  Monitor for any signs of fluid overload, ECHO 12/26 Ejfx 45% 10.  Morbid obesity.  BMI 47.85.  Dietary follow-up. 11. Education: educated regarding the importance of using the incentive spirometer hourly. Patient is currently achieving 1250. Recommended that she monitor her progress over time.   12.  ID- 27d  post Covid [facility], >21 d post Covid diagnosis, no C diff Wi;; d/c airborne and contact prec, ran this by pulmonary on call who feels this is reasonable  13.  Mild anemia baseline ~13 will check stool guaic   1/11: Hgb 11.8  to 11.3    LOS: 4 days A FACE TO FACE EVALUATION WAS PERFORMED  Charlett Blake 02/24/2019, 9:22 AM

## 2019-02-24 NOTE — Progress Notes (Signed)
Pt refusing CPAP for the night. RT will monitor as needed

## 2019-02-24 NOTE — Progress Notes (Signed)
Physical Therapy Session Note  Patient Details  Name: Anita Hunter MRN: [id number] Date of Birth: [birth year]  Today's Date: 02/24/2019 PT Individual Time: 1320-1400 PT Individual Time Calculation (min): 40 min    Short Term Goals: Week 1:  PT Short Term Goal 1 (Week 1): Pt will perform bed to wc to bed w/cga consistently w/LRAD PT Short Term Goal 2 (Week 1): pt will ambulate 68f w/RW and cga. PT Short Term Goal 3 (Week 1): pt will perform all bed mobility w/supervision  Skilled Therapeutic Interventions/Progress Updates:    Pt supine in bed upon PT arrival, agreeable to therapy tx and reports B foot pain 10/10, pt reports no relief. Pt transferred to sitting EOB independently and ambulated x 60 ft and x 80 ft to the gym with RW and CGA, SpO2 88% following activity - returned to 93% follow seated rest break with cues for breathing techniques.Pt transferred to mat stand pivot supervision with RW. Pt seated edge of mat (mat raise to let feet hand for pain relief) while performing UE strengthening exercises for strengthening, 2 x 10 of each with cues for techniques: shoulder press 3# dumbbell, shoulder abduction 3# dumbbell, B shoulder flexion with 4# dowel. Pt ambulated x 99 ft today with RW and CGA, cues for foot clearance/heel strike, SpO2 87% following activity - quickly returned to 93% following activity. Pt performed 2 x 5 sit<>stands from elevated mat without UE support working on LE strengthening and balance, cues for anterior weightshift. Pt performed stand pivot to w/c with supervision and transported back to room, left in w/c with needs in reach and chair alarm set.   Therapy Documentation Precautions:  Precautions Precautions: Fall Precaution Comments: monitor 02 sats, HR Restrictions Weight Bearing Restrictions: No   Therapy/Group: Individual Therapy  EDrema DallasSchagen 02/24/2019, 12:48 PM

## 2019-02-25 ENCOUNTER — Inpatient Hospital Stay (HOSPITAL_COMMUNITY): Payer: BC Managed Care – PPO | Admitting: Occupational Therapy

## 2019-02-25 ENCOUNTER — Inpatient Hospital Stay (HOSPITAL_COMMUNITY): Payer: BC Managed Care – PPO | Admitting: Physical Therapy

## 2019-02-25 LAB — GLUCOSE, CAPILLARY
Glucose-Capillary: 133 mg/dL — ABNORMAL HIGH (ref 70–99)
Glucose-Capillary: 113 mg/dL — ABNORMAL HIGH (ref 70–99)
Glucose-Capillary: 161 mg/dL — ABNORMAL HIGH (ref 70–99)
Glucose-Capillary: 117 mg/dL — ABNORMAL HIGH (ref 70–99)

## 2019-02-25 MED ORDER — CAPSAICIN 0.025 % EX CREA
TOPICAL_CREAM | Freq: Two times a day (BID) | CUTANEOUS | Status: DC
  Filled 2019-02-25: qty 60

## 2019-02-25 MED ORDER — HYDROCODONE-ACETAMINOPHEN 5-325 MG PO TABS
1.0000 | ORAL_TABLET | Freq: Three times a day (TID) | ORAL | Status: DC
  Administered 2019-02-25 – 2019-02-26 (×3): 1 via ORAL
  Filled 2019-02-25 (×3): qty 1

## 2019-02-25 MED ORDER — GABAPENTIN 300 MG PO CAPS
600.0000 mg | ORAL_CAPSULE | Freq: Three times a day (TID) | ORAL | Status: DC
  Administered 2019-02-25 – 2019-02-27 (×8): 600 mg via ORAL
  Filled 2019-02-25 (×8): qty 2

## 2019-02-25 MED ORDER — LORAZEPAM 0.5 MG PO TABS
0.5000 mg | ORAL_TABLET | Freq: Two times a day (BID) | ORAL | Status: DC
  Administered 2019-02-25 (×2): 0.5 mg via ORAL
  Filled 2019-02-25 (×2): qty 1

## 2019-02-25 NOTE — Progress Notes (Signed)
[facility] PHYSICAL MEDICINE & REHABILITATION PROGRESS NOTE   Subjective/Complaints: Pt is tearful c/o burning pain in feet this am ,particpated well with PT, poor sleep due to pain  Wants to go home   ROS no CP, SOB, N/V/D  Objective:   No results found. No results for input(s): WBC, HGB, HCT, PLT in the last 72 hours. No results for input(s): NA, K, CL, CO2, GLUCOSE, BUN, CREATININE, CALCIUM in the last 72 hours.  Intake/Output Summary (Last 24 hours) at 02/25/2019 0823 Last data filed at 02/25/2019 0755 Gross per 24 hour  Intake 900 ml  Output --  Net 900 ml     Physical Exam: Vital Signs Blood pressure 126/86, pulse (!) 107, temperature 98 F (36.7 C), resp. rate 18, weight (!) 148.7 kg, SpO2 94 %. General: No acute distress Mood and affect are appropriate Heart: Regular rate and rhythm no rubs murmurs or extra sounds Lungs: Clear to auscultation, breathing unlabored, no rales or wheezes Abdomen: Positive bowel sounds, soft nontender to palpation, nondistended Extremities: No clubbing, cyanosis, or edema, normal pedal pulses, feet are warm Skin: No evidence of breakdown, no evidence of rash No ulceration or skin color change in feet  Neurologic: Cranial nerves II through XII intact, motor strength is 5/5 in bilateral deltoid, bicep, tricep, grip, hip flexor, knee extensors, ankle dorsiflexor and plantar flexor Sensory exam reduced  sensation to light touch in dorsum of both feet Cerebellar exam normal finger to nose to finger as well as heel to shin in bilateral upper and lower extremities Musculoskeletal: Full range of motion in all 4 extremities. No joint swelling  Assessment/Plan: 1. Functional deficits secondary to debility post resp failure  which require 3+ hours per day of interdisciplinary therapy in a comprehensive inpatient rehab setting.  Physiatrist is providing close team supervision and 24 hour management of active medical problems listed  below.  Physiatrist and rehab team continue to assess barriers to discharge/monitor patient progress toward functional and medical goals  Care Tool:  Bathing    Body parts bathed by patient: Right arm, Left arm, Chest, Abdomen, Front perineal area, Buttocks, Right upper leg, Left upper leg, Right lower leg, Left lower leg, Face   Body parts bathed by helper: Buttocks, Right lower leg, Left lower leg     Bathing assist Assist Level: Set up assist     Upper Body Dressing/Undressing Upper body dressing   What is the patient wearing?: Pull over shirt    Upper body assist Assist Level: Minimal Assistance - Patient > 75%    Lower Body Dressing/Undressing Lower body dressing      What is the patient wearing?: Pants     Lower body assist Assist for lower body dressing: Minimal Assistance - Patient > 75%     Toileting Toileting    Toileting assist Assist for toileting: Minimal Assistance - Patient > 75% Assistive Device Comment: walker   Transfers Chair/bed transfer  Transfers assist     Chair/bed transfer assist level: Contact Guard/Touching assist Chair/bed transfer assistive device: Programmer, multimedia   Ambulation assist      Assist level: Contact Guard/Touching assist Assistive device: Walker-rolling Max distance: 121f   Walk 10 feet activity   Assist  Walk 10 feet activity did not occur: Safety/medical concerns  Assist level: Contact Guard/Touching assist Assistive device: Walker-rolling   Walk 50 feet activity   Assist Walk 50 feet with 2 turns activity did not occur: Safety/medical concerns  Assist level: Contact Guard/Touching  assist Assistive device: Walker-rolling    Walk 150 feet activity   Assist Walk 150 feet activity did not occur: Safety/medical concerns         Walk 10 feet on uneven surface  activity   Assist Walk 10 feet on uneven surfaces activity did not occur: Safety/medical concerns          Wheelchair     Assist Will patient use wheelchair at discharge?: No Type of Wheelchair: Manual    Wheelchair assist level: Supervision/Verbal cueing Max wheelchair distance: 100    Wheelchair 50 feet with 2 turns activity    Assist        Assist Level: Supervision/Verbal cueing   Wheelchair 150 feet activity     Assist          Blood pressure 126/86, pulse (!) 107, temperature 98 F (36.7 C), resp. rate 18, weight (!) 148.7 kg, SpO2 94 %.    Medical Problem List and Plan: 1.  Debility secondary to acute on chronic respiratory failure with hypoxia related to COVID-19         Team conference today please see physician documentation under team conference tab, met with team face-to-face to discuss problems,progress, and goals. Formulized individual treatment plan based on medical history, underlying problem and comorbidities.             -ELOS/Goals: 14-18 days, ModI PT, OT, I in SLP 2.  Antithrombotics: -DVT/anticoagulation: Lovenox.  Doppler studies negative             -antiplatelet therapy: N/A 3. Pain Management/chronic back pain:              Neuropathic pain is severe likely related to uncontrolled diabetes with worsening after critical illness, constant, and inhibiting her participation in PT , inhibits sleep, has anxiety component as well  No significant relief with gabapentin up to 16oomg per day,will increase to 2460m/d Started cymbalta 231myesterday Also on nortriptyline  255mo benefit Anxiety related to pain but not receiving PRN Ativan more than ~1x per day - will schedule BID No relief with tramadol 58m57mt had some "soothing of pain" with hydrocodone 5mg 46m1 x dose, we discussed resp depression with this med but this can be monitored in hospital setting - will schedule TID since pt not receiving more than 1-2 tab per day  Will plan to d/c on tramadol rather than hydrocodone   4. Mood: Provide emotional support- will need neuropsych eval               -antipsychotic agents: N/A 5. Neuropsych: This patient is capable of making decisions on her own behalf. 6. Skin/Wound Care/left ankle wound: Hydrocolloid dressing left ankle daily 7. Fluids/Electrolytes/Nutrition: Routine in and outs with follow-up chemistries 8.  Diabetes mellitus.  Hemoglobin A1c 11.1 12/14.  NovoLog 6 units 3 times daily, Levemir 35 units twice daily.  CBG (last 3)  Recent Labs    02/24/19 1657 02/24/19 2009 02/25/19 0605  GLUCAP 154* 142* 133*  diabetes controlled  9.  Acute systolic congestive heart failure.  Continue Lasix 40 mg daily.  Monitor for any signs of fluid overload, ECHO 12/26 Ejfx 45% 10.  Morbid obesity.  BMI 47.85.  Dietary follow-up. 11. Education: educated regarding the importance of using the incentive spirometer hourly. Patient is currently achieving 1250. Recommended that she monitor her progress over time.   12.  ID- 27d  [facility]  [facility], >21 d post Covid diagnosis, no C diff Wi;; d/c airborne and contact prec, ran this by pulmonary on call who feels this is reasonable  13.  Mild anemia baseline ~13 will check stool guaic   1/11: Hgb 11.8 to 11.3    LOS: 5 days A FACE TO FACE EVALUATION WAS PERFORMED  Charlett Blake 02/25/2019, 8:23 AM

## 2019-02-25 NOTE — Plan of Care (Signed)
  Problem: Consults Goal: RH GENERAL PATIENT EDUCATION Description: See Patient Education module for education specifics. Outcome: Progressing   Problem: RH BOWEL ELIMINATION Goal: RH STG MANAGE BOWEL WITH ASSISTANCE Description: STG Manage Bowel with Min Assistance. Outcome: Progressing   Problem: RH BLADDER ELIMINATION Goal: RH STG MANAGE BLADDER WITH ASSISTANCE Description: STG Manage Bladder With Min Assistance Outcome: Progressing Goal: RH STG MANAGE BLADDER WITH MEDICATION WITH ASSISTANCE Description: STG Manage Bladder With Medication With [place]. Outcome: Progressing   Problem: RH SKIN INTEGRITY Goal: RH STG SKIN FREE OF INFECTION/BREAKDOWN Description: No new breakdown with min assist  Outcome: Progressing Goal: RH STG ABLE TO PERFORM INCISION/WOUND CARE W/ASSISTANCE Description: STG Able To Perform Incision/Wound Care With [employer]. Outcome: Progressing   Problem: RH PAIN MANAGEMENT Goal: RH STG PAIN MANAGED AT OR BELOW PT'S PAIN GOAL Description: < 3 out of 10.  Outcome: Progressing

## 2019-02-25 NOTE — Progress Notes (Signed)
Informed of B/P and Pulse reading 152/123 ( HR  ) by NTJunie Panning), patient assessed, resting in bed , alert cooperative denies pain or discomfort unable to take patient B/P reading manually due to cuff size and b/p taking via dianmap reading 126/86 ( HR 107), Patient states she was up and in room prior to blood Nira Retort being taking initially, no complaint  refer to VS documentation record)

## 2019-02-25 NOTE — Evaluation (Addendum)
Recreational Therapy Assessment and Plan  Patient Details  Name: Anita Hunter MRN: [id number] Date of Birth: [birth year] Today's Date: 02/25/2019  Rehab Potential: Good ELOS:   10 days  Assessment Problem List:      Patient Active Problem List   Diagnosis Date Noted  . Debility 02/20/2019  . Pressure injury of skin 02/20/2019  . Aspiration pneumonia ([place]) 02/15/2019  . Acute lower UTI 02/15/2019  . Acute on chronic respiratory failure with hypoxia and hypercapnia ([place]) 02/15/2019  . Acute systolic CHF (congestive heart failure) ([place]) 02/15/2019  . ARDS (adult respiratory distress syndrome) ([place])   . Hypertriglyceridemia   . NASH (nonalcoholic steatohepatitis) 01/27/2019  . COVID-19 01/26/2019  . Type 2 diabetes mellitus ([place]) 01/26/2019  . Hypomagnesemia 01/26/2019  . Acute non intractable tension-type headache 04/16/2018  . Encounter for annual physical exam 04/16/2018  . Chronic left-sided low back pain without sciatica 03/19/2018  . Seasonal allergic rhinitis 03/19/2018  . RLQ abdominal pain 10/24/2016  . Obstructive sleep apnea 08/27/2016  . Back pain 06/27/2016  . Prediabetes 03/02/2015  . Sinusitis, chronic 03/25/2014  . Tobacco use disorder 06/14/2012  . Knee pain, left 06/13/2012  . Morbid obesity ([place]) 04/11/2006  . Eczema 04/11/2006    Past Medical History:      Past Medical History:  Diagnosis Date  . Back pain   . Eczema   . Seasonal allergies   . Sleep apnea   . Type 2 diabetes mellitus ([place]) 01/26/2019   Past Surgical History:       Past Surgical History:  Procedure Laterality Date  . NO PAST SURGERIES      Assessment & Plan Clinical Impression: Patient is a 29 y.o. year old female with history of chronic back pain, morbid obesity with BMI of 47.85, eczema, sleep apnea, type 2 diabetes mellitus.Pt presented 01/26/2019 with progressive shortness of breath x2 days with sinus congestion, fatigue, malaise, low-grade fever and  chills. She had a history of exposure to positive COVID-19 2 days before the onset of her symptoms. In the ED SARS coronavirus positive, glucose 257, hemoglobin A1c 11.1, D-dimer 0.53. Chest x-ray no active disease however follow-up chest x-ray showed developmentof bilateral heterogeneous lung opacities consistent with COVID-19 pneumonia. Patient did require high flow nasal cannula oxygen therapy. She was placed on prednisone therapy and tapered off as well as antivirals. Blood culture showing no growth to date. Patient has been maintained on BiPAP. Bouts of hypokalemia and placed on supplement.Hospital course complicated by E. coli UTI again antibiotic treatment has been completed. Patient was treated for acute systolic congestive heart failure maintained on Lasix monitoring hydration. Sheis tolerating a regular diet. Maintain on Lovenox for DVT prophylaxis with venous Doppler studies negative.[place] for left ankle wound advised hydrocolloid dressing daily. Patient transferred to CIR on 02/20/2019 .    Pt presents with decreased activity tolerance, decreased functional mobility, decreased balance, decreased oxygen support, feelings of anxiety Limiting pt's independence with leisure/community pursuits.  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 Other Leisure Interests: Movies(anime) Leisure Participation Style: With Family/Friends Awareness of Community Resources: Good-identify 3 post discharge leisure resources Psychosocial / Spiritual Methods of Stress Management: visit with family Social interaction - Mood/Behavior: Cooperative Academic librarian Appropriate for Education?: Yes Strengths/Weaknesses Patient Strengths/Abilities: Willingness to participate Patient weaknesses: Physical limitations TR Patient demonstrates impairments in the following area(s): Endurance;Motor;Pain  Plan Min 1 TR session >20  minutes duirng LOS  Recommendations for other services: None   Discharge Criteria: Patient will  be discharged from TR if patient refuses treatment 3 consecutive times without medical reason.  If treatment goals not met, if there is a change in medical status, if patient makes no progress towards goals or if patient is discharged from hospital.  The above assessment, treatment plan, treatment alternatives and goals were discussed and mutually agreed upon: by patient  [place] 02/25/2019, 1:26 PM

## 2019-02-25 NOTE — Progress Notes (Signed)
Physical Therapy Session Note  Patient Details  Name: Anita Hunter MRN: [id number] Date of Birth: [birth year]  Today's Date: 02/25/2019 PT Individual Time: 9450-3888 PT Individual Time Calculation (min): 70 min   Short Term Goals: Week 1:  PT Short Term Goal 1 (Week 1): Pt will perform bed to wc to bed w/cga consistently w/LRAD PT Short Term Goal 2 (Week 1): pt will ambulate 73f w/RW and cga. PT Short Term Goal 3 (Week 1): pt will perform all bed mobility w/supervision  Skilled Therapeutic Interventions/Progress Updates:   Pt received sitting in WC and agreeable to PT. Pt reports severe pain in BLE with ted hose increasing pain. Removed by PT for pain management and only mild improvement. PT obtained and ELR to help reduce pain from dependent position. No change in pain level with ELR in place. Despite 20/10 pain per pt reports, she was agreeable to attempt therapy.   Gait training with RW 2 x 1070fwith CGA progressing to supervision assist, improved step height noted on the RLE with only occasional foot drag. PT assessed BP following gait training 155/122 then decreased to 140/100 after 2 min rest on the first bout. 148/111 then decreased to 142/103 following the second bout. No new s/s with elevated BP.   PT instructed pt dynamic balance training as well as cardiovascular endurance training using Wii sports bowling, boxing and tennis. Pt able to tolerate standing 10 min, 5 min and 6 min respectively with each game. No LOB noted throughout with intermittent use 1-0 UE support. Pt states mild decrease in BLE foot pain with weight bearing.   Pt returned to room and performed ambulatory transfer to bed with RW and supervision assist for safety. Sit>supine completed with supervision assist through qped, and left supine in bed with call bell in reach and all needs met.         Therapy Documentation Precautions:  Precautions Precautions: Fall Precaution Comments: monitor 02 sats,  HR Restrictions Weight Bearing Restrictions: No   Pain: Pain Assessment Pain Scale: 0-10 Pain Score: 10-Worst pain ever Pain Type: Neuropathic pain Pain Location: Foot Pain Orientation: Right;Left Pain Descriptors / Indicators: (nerve pain) Pain Frequency: Intermittent Pain Onset: On-going Patients Stated Pain Goal: 3 Pain Intervention(s): Medication (See eMAR)   Therapy/Group: Individual Therapy  AuLorie Phenix/13/2021, 3:29 PM

## 2019-02-25 NOTE — Progress Notes (Signed)
Occupational Therapy Session Note  Patient Details  Name: Anita Hunter MRN: [id number] Date of Birth: [birth year]  Today's Date: 02/25/2019 OT Individual Time: 0900- 0959 and 1105-1200 OT Individual Time Calculation (min):  59 min and 55 min   Short Term Goals: Week 1:  OT Short Term Goal 1 (Week 1): LTG=STG 2/2 ELOS  Skilled Therapeutic Interventions/Progress Updates:    Session 1: Upon entering the room, pt with c/o 10/10 pain in B feet described as burning sensation. Pt standing from EOB with supervision and ambulating with RW into bathroom with min guard. Pt preformed toilet transfer with min guard and doffing clothing items while seated. Pt transferred from toilet to TTB with min HHA 4'. Pt seated on TTB for bathing with use of LH sponge to increase independence at supervision level overall. Pt exiting the bathroom with RW and seated on EOB for dressing tasks. UB clothing with set up A and LB self care with min A to thread onto R foot. She would benefit from [place] next session. OT wrapping B LEs with ACE WRAP but pt unable to tolerate secondary to pain increasing and spasms noted in L LE. Pt returning to supine to rest at end of session with call bell and all needed items within reach upon exiting the room.   Session 2: Upon entering the room, pt seated on EOB awaiting OT arrival and requesting to use bathroom. Pt ambulating with RW and min guard into bathroom. Pt performed clothing management and hygiene after voiding with min guard for balance. Pt ambulating to sink and standing for hand hygiene with close supervision before returning to wheelchair. OT assisted pt with donning B thigh high TED hose. Pt was able to tolerate and reported , " I think that makes it feel better". Pt propelled wheelchair with B UEs for endurance to ADL apartment. OT demonstrated transfer with RW onto TTB with pt returning demonstration with min guard for balance. OT recommended purchase of safety treads for  decreased fall risk. Pt verbalized understanding. Pt ambulating with RW on carpeted surface and over thresholds with min guard. Pt sitting on low sofa with min - mod A to stand from. Pt then transitioned with 28 inch height standard bed, similar to home environment, and performed sit <>supine with close supervision. Pt ambulating with RW and S - min guard 30' back to room. Pt seated in wheelchair with call bell and all needed items within reach.   Therapy Documentation Precautions:  Precautions Precautions: Fall Precaution Comments: monitor 02 sats, HR Restrictions Weight Bearing Restrictions: No General:   Vital Signs:  Pain: Pain Assessment Pain Scale: 0-10 Pain Score: 10-Worst pain ever Pain Type: Neuropathic pain Pain Location: Foot Pain Orientation: Right;Left Pain Descriptors / Indicators: (nerve pain) Pain Frequency: Intermittent Pain Onset: On-going Patients Stated Pain Goal: 3 Pain Intervention(s): Medication (See eMAR) ADL: ADL Eating: Set up Grooming: Contact guard Upper Body Bathing: Supervision/safety Lower Body Bathing: Moderate assistance Upper Body Dressing: Minimal assistance Lower Body Dressing: Moderate assistance Toileting: Moderate assistance Toilet Transfer: Moderate assistance Vision   Perception    Praxis   Exercises:   Other Treatments:     Therapy/Group: Individual Therapy  Gypsy Decant 02/25/2019, 12:30 PM

## 2019-02-25 NOTE — Progress Notes (Signed)
   02/25/19 1558  MEWS Assessment  Is this an acute change? No

## 2019-02-25 NOTE — Progress Notes (Signed)
Team Conference Report to Patient/Family  Team Conference discussion was reviewed with the patient, including goals, any changes in plan of care and target discharge date of 03/03/19.  Patient expressed understanding and are in agreement.  Patient had concerns about a w/c for community access due to fatigue; PT to follow up with patient on recommended DME. Family education set up for Sunday morning 03/01/19 with patient's mother.  Anita Hunter B 02/25/2019, 4:12 PM

## 2019-02-25 NOTE — Patient Care Conference (Signed)
Inpatient RehabilitationTeam Conference and Plan of Care Update Date: 02/25/2019   Time: 10:55 AM    Patient Name: Anita Hunter      Medical Record Number: [id number]  Date of Birth: Sep 08, 1990 Sex: Female         Room/Bed: 4M13C/4M13C-01 Payor Info: Payor: [place] / Plan: BCBS COMM PPO / Product Type: *No Product type* /    Admit Date/Time:  02/20/2019  4:39 PM  Primary Diagnosis:  Debility  Patient Active Problem List   Diagnosis Date Noted  . Debility 02/20/2019  . Pressure injury of skin 02/20/2019  . Aspiration pneumonia ([place]) 02/15/2019  . Acute lower UTI 02/15/2019  . Acute on chronic respiratory failure with hypoxia and hypercapnia ([place]) 02/15/2019  . Acute systolic CHF (congestive heart failure) ([place]) 02/15/2019  . ARDS (adult respiratory distress syndrome) ([place])   . Hypertriglyceridemia   . NASH (nonalcoholic steatohepatitis) 01/27/2019  . COVID-19 01/26/2019  . Type 2 diabetes mellitus ([place]) 01/26/2019  . Hypomagnesemia 01/26/2019  . Acute non intractable tension-type headache 04/16/2018  . Encounter for annual physical exam 04/16/2018  . Chronic left-sided low back pain without sciatica 03/19/2018  . Seasonal allergic rhinitis 03/19/2018  . RLQ abdominal pain 10/24/2016  . Obstructive sleep apnea 08/27/2016  . Back pain 06/27/2016  . Prediabetes 03/02/2015  . Sinusitis, chronic 03/25/2014  . Tobacco use disorder 06/14/2012  . Knee pain, left 06/13/2012  . Morbid obesity ([place]) 04/11/2006  . Eczema 04/11/2006    Expected Discharge Date: Expected Discharge Date: 03/03/19  Team Members Present: Physician leading conference: Dr. Alysia Penna Social Worker Present: Lennart Pall, LCSW Nurse Present: Dorien Chihuahua, RN;Other (comment)(Susan Truman Hayward, RN) Case Manager: Karene Fry, RN PT Present: Michaelene Song, PT OT Present: Darleen Crocker, OT SLP Present: Charolett Bumpers, SLP PPS Coordinator present : Gunnar Fusi, Novella Olive, PT   Current Status/Progress Goal Weekly Team Focus  Bowel/Bladder   Continent  of Bladder/Bowel     Assess toileting needs QS/PRN   Swallow/Nutrition/ Hydration             ADL's   min A overall LB self care, CGA functional transfers, set up A UB self care, min A toileting  supervision  self care retraining, balance, endurance, strengthening, pt/family edu   Mobility   supervision bed mobility, CGA sit<>stand and stand pivot transfers using RW, gait up to 137f using RW with CGA, 4 steps (6" height) using  BHRs with min assist  mod-I with bed mobility, transfers, and household ambulation using LRAD; CGA 8 stairs; supervision car transfer  activity tolerance, B LE strengthening, transfers, gait training, stair navigation, pt education, standing balance   Communication             Safety/Cognition/ Behavioral Observations            Pain   c/o neuropathy pain to feet with radiating sensations,, takes Norco Q6 hrs pain rate pain score 10/10  to be pain fre-free, < 3  Assess QS/PRN, reevaluate the effectiveness of meds   Skin   Sacral dressing intact coccyx area  no new areas of skin breakdown, maintain skin ingerity, no evident of infection       Rehab Goals Patient on target to meet rehab goals: Yes *See Care Plan and progress notes for long and short-term goals.     Barriers to Discharge  Current Status/Progress Possible Resolutions Date Resolved   Nursing  PT                    OT                  SLP                SW     Set for discharge home initially to mother's apartment with stepfather assisting as need when mother is at work.          Discharge Planning/Teaching Needs:  Going home with mother and step-father. Mother works during the day however step-father available to assist as needed.  TBD   Team Discussion: Post covid, neuropathic pain, DM, polyneuropathy, meds adjusted, increased gabapentin, anxious.  RN high BP last night, pain, meds scheduled,  continent.  OT S bathing, CGA RW transfers, amb in room, set up UB dressing, goals S, did not tolerate TEDs this am, L leg spasms.  PT amb 100' RW CGA, O2 sats 85%, 4 steps both rails, need to work on steps, only 1 rail at home.  MD says ice on feet helped with pain.  SW says mom to come for family ed on weekend.   Revisions to Treatment Plan: N/A     Medical Summary Current Status: elevated BP, ? pain related, neuropathic pain in feet, desaturation O2 with activity Weekly Focus/Goal: pain med management  Barriers to Discharge: Weight;Medical stability;Other (comments)  Barriers to Discharge Comments: anxiety along with pain Possible Resolutions to Barriers: needs rails for steps, med management for pain, ice to feet   Continued Need for Acute Rehabilitation Level of Care: The patient requires daily medical management by a physician with specialized training in physical medicine and rehabilitation for the following reasons: Direction of a multidisciplinary physical rehabilitation program to maximize functional independence : Yes Medical management of patient stability for increased activity during participation in an intensive rehabilitation regime.: Yes Analysis of laboratory values and/or radiology reports with any subsequent need for medication adjustment and/or medical intervention. : Yes   I attest that I was present, lead the team conference, and concur with the assessment and plan of the team.   Retta Diones 02/25/2019, 8:15 PM   Team conference was held via web/ teleconference due to [place] - 19

## 2019-02-25 NOTE — Progress Notes (Signed)
Patient refused CPAP stating she does like to wear it, informed RT

## 2019-02-26 ENCOUNTER — Inpatient Hospital Stay (HOSPITAL_COMMUNITY): Payer: BC Managed Care – PPO

## 2019-02-26 ENCOUNTER — Inpatient Hospital Stay (HOSPITAL_COMMUNITY): Payer: BC Managed Care – PPO | Admitting: *Deleted

## 2019-02-26 ENCOUNTER — Inpatient Hospital Stay (HOSPITAL_COMMUNITY): Payer: BC Managed Care – PPO | Admitting: Occupational Therapy

## 2019-02-26 LAB — GLUCOSE, CAPILLARY
Glucose-Capillary: 119 mg/dL — ABNORMAL HIGH (ref 70–99)
Glucose-Capillary: 160 mg/dL — ABNORMAL HIGH (ref 70–99)
Glucose-Capillary: 148 mg/dL — ABNORMAL HIGH (ref 70–99)
Glucose-Capillary: 171 mg/dL — ABNORMAL HIGH (ref 70–99)

## 2019-02-26 MED ORDER — LORAZEPAM 0.5 MG PO TABS
0.5000 mg | ORAL_TABLET | Freq: Three times a day (TID) | ORAL | Status: DC
  Administered 2019-02-26 – 2019-03-02 (×16): 0.5 mg via ORAL
  Filled 2019-02-26 (×17): qty 1

## 2019-02-26 MED ORDER — LABETALOL HCL 100 MG PO TABS
100.0000 mg | ORAL_TABLET | Freq: Two times a day (BID) | ORAL | Status: DC
  Administered 2019-02-26 – 2019-03-01 (×7): 100 mg via ORAL
  Filled 2019-02-26 (×7): qty 1

## 2019-02-26 MED ORDER — HYDROCODONE-ACETAMINOPHEN 5-325 MG PO TABS
1.0000 | ORAL_TABLET | Freq: Three times a day (TID) | ORAL | Status: DC
  Administered 2019-02-26 – 2019-03-02 (×16): 1 via ORAL
  Filled 2019-02-26 (×17): qty 1

## 2019-02-26 NOTE — Progress Notes (Signed)
Physical Therapy Session Note  Patient Details  Name: Anita Hunter MRN: [id number] Date of Birth: 1990/09/18  Today's Date: 02/26/2019 PT Individual Time: 1400-1445 PT Individual Time Calculation (min): 45 min   Short Term Goals: Week 1:  PT Short Term Goal 1 (Week 1): Pt will perform bed to wc to bed w/cga consistently w/LRAD PT Short Term Goal 2 (Week 1): pt will ambulate 15f w/RW and cga. PT Short Term Goal 3 (Week 1): pt will perform all bed mobility w/supervision  Skilled Therapeutic Interventions/Progress Updates:    D/c planning discussed and overall goals. Pt reporting feeling overall prepared for upcoming d/c next week and denies major concerns.   Bed mobility from supine to sit with supervision and transferred to w/c with supervision for safety. Declines use of AFO this session due to improvement noted with last therapy session. Focused on stair negotiation for home entry practice and functional strengthening with single rail on R up/down 8 steps with overall CGA for balance and 1 standing rest break to catch her breath. Vitals WFL after completion. Discussed energy conservation techniques and use of flutter valve and incentive spirometer even after to d/c. Pt verbalized understanding and in agreement. Due to significant increase in burning sensation in feet becoming unbearable returned to the room and did a cool foot soak which relieved pain. Performed UE strengthening exercises including 4# straight weight for bicep curls, overhead press, and forward press with scapular retraction x 15 reps x 2 sets each. Trunk rotation with 2kg to each diretion x 10 reps. Left with all needs in reach and feet soaking.  Therapy Documentation Precautions:  Precautions Precautions: Fall Precaution Comments: monitor 02 sats, HR Restrictions Weight Bearing Restrictions: No   Vital Signs: O2 = 95-100% after stairs and rest HR = 109-116 bpm with activity Pain:  Worst pain ever in bilateral  feet (R > L) reported as burning sensation. Increased during session and ended up soaking in cool water bath which relieved pain.   Therapy/Group: Individual Therapy  GCanary BrimBIvory Broad PT, DPT, CBIS  02/26/2019, 2:49 PM

## 2019-02-26 NOTE — Progress Notes (Signed)
Physical Therapy Session Note  Patient Details  Name: Anita Hunter MRN: [id number] Date of Birth: 06/13/1990  Today's Date: 02/26/2019 PT Individual Time: 4008-6761 PT Individual Time Calculation (min): 48 min   Short Term Goals: Week 1:  PT Short Term Goal 1 (Week 1): Pt will perform bed to wc to bed w/cga consistently w/LRAD PT Short Term Goal 2 (Week 1): pt will ambulate 67f w/RW and cga. PT Short Term Goal 3 (Week 1): pt will perform all bed mobility w/supervision     Skilled Therapeutic Interventions/Progress Updates:  Pt seated in w/c, with footrests removed and feet on wet towels.  Pt reported pain bil fee, neuropathic, 8/10; premedicated.  PT educated pt about elevating feet via ELRs or using recliner, to address edema bil LEs, but she stated that elevating her LEs make her feet hurt worse.  She stated that she tolerated TEDS well yesterday, and thought they were helpful. Dressing on LLL lateral wound had dropped off; SManuela Schwartz RN replaced it.  PT placed TEDS on feet and past ankles; pt pulled them up the rest of the way. PT started feet into shoes; pt completed; PT tied them.  W/c propulsion with distant supervision on level tile x 100' x 2 using bil UEs and holding bil LEs up off of floor; she declined foot rests. Using pt's personal pulse oximeter, HR 104, O2 sats 90% BP 127/82, in LUE with large cuff. after this activity.  Gait training with RW on level tile x 150' with CGA, no RAFO.  Pt demonstrates mild R foot drop, but feels that it is improving; she cleared foot even when fatigued at end of ambulation. O2 sats 90% and HR 111 after gait.  At end of session, pt seated in w/c with feet on bar of bedside table.  Needs at hand.         Therapy Documentation Precautions:  Precautions Precautions: Fall Precaution Comments: monitor 02 sats, HR Restrictions Weight Bearing Restrictions: No       Therapy/Group: Individual Therapy  [name] 02/26/2019, 12:25 PM

## 2019-02-26 NOTE — Progress Notes (Signed)
Occupational Therapy Session Note  Patient Details  Name: Anita Hunter MRN: [id number] Date of Birth: [birth year]  Today's Date: 02/26/2019 OT Individual Time: 0800-0900 and 1300 - 1328 OT Individual Time Calculation (min): 60 min and 28 mins   Short Term Goals: Week 1:  OT Short Term Goal 1 (Week 1): LTG=STG 2/2 ELOS  Skilled Therapeutic Interventions/Progress Updates:    Session 1: Pt seated on EOB with RN present in room giving medications. Pt reports 8/10 neuropathic pain in B feet but agreeable to OT intervention and requesting to shower. Pt ambulating with RW and close supervision 10' into bathroom for toileting. Pt taking several steps from commode to shower without use of RW and min guard for balance. Pt seated on TTB for bathing at shower level with supervision overall and then exiting to sit on EOB for dressing. Pt able to perform LB dressing with min guard as well and UB self care with set up A to obtain needed items. Pt requests cold cloths placed on feet secondary to "burning" sensation. Call bell and all needed items within reach upon exiting the room.   Session 2: Upon entering the room, pt supine in bed and reports she is in "comfortable position" but requests assistance to doff B TED hose. Pt sitting up on EOB once hose removed with reported 10/10 increasing pain in B feet. Pt requesting OT place cold cloths and provide pressure to feet with pt reporting some relief and agreeable to participate. OT provided pt with paper handout for B UE strengthening exercises with use of level 3 resistive theraband. Pt returning demonstrations for 2 sets of 10 chest pulls, straight arm raises, shoulder diagonals, alternating punches, and bicep curls with min cuing for proper technique. Pt needing rest breaks as needed secondary to fatigue. Pt transferred into wheelchair with min guard and use of RW. Call bell and all needed items within reach upon exiting the room.   Therapy  Documentation Precautions:  Precautions Precautions: Fall Precaution Comments: monitor 02 sats, HR Restrictions Weight Bearing Restrictions: No General:   Vital Signs: Therapy Vitals Temp: 98.1 F (36.7 C) Pulse Rate: (!) 111 Resp: 18 BP: (!) 141/90 Patient Position (if appropriate): Sitting Oxygen Therapy SpO2: 94 % O2 Device: Room Air ADL: ADL Eating: Set up Grooming: Contact guard Upper Body Bathing: Supervision/safety Lower Body Bathing: Moderate assistance Upper Body Dressing: Minimal assistance Lower Body Dressing: Moderate assistance Toileting: Moderate assistance Toilet Transfer: Moderate assistance   Therapy/Group: Individual Therapy  Gypsy Decant 02/26/2019, 4:20 PM

## 2019-02-26 NOTE — Progress Notes (Signed)
Recreational Therapy Session Note  Patient Details  Name: Anita Hunter MRN: [id number] Date of Birth: May 07, 1990 Today's Date: 02/26/2019 Time:  9307-10 Pain: no c/o Skilled Therapeutic Interventions/Progress Updates: Session focused on continued leisure assessment, discharge planning and coping strategies.  Pt  discussed feelings of anxiety in relation to hospitalization and desire to get home and have the support of her mom/family.  Reviewed relaxation strategies with emphasis on deep breathing.  Pt stated understanding.  Therapy/Group: Individual Therapy   Ayza Ripoll 02/26/2019, 4:00 PM

## 2019-02-26 NOTE — Progress Notes (Signed)
Patient refused CPAP,during night

## 2019-02-26 NOTE — Telephone Encounter (Signed)
Patient with necrotizing fasciitis and lengthy hospitalization. Now with lengthy rehab stay on the horizon. These issues currently managed by rehab specialists. Can follow up when discharged for rehab for these issues.  [name] MD PGY-3 Family Medicine Resident

## 2019-02-26 NOTE — Progress Notes (Signed)
Patient continue to verbalize discomfort pain and burning to feet, rating discomfort on pain score 10/10 constantly.Continue medical regime

## 2019-02-26 NOTE — Progress Notes (Signed)
Nutrition Follow-up  RD working remotely.  DOCUMENTATION CODES:   Morbid obesity  INTERVENTION:   - Continue Boost Plus po TID between meals, each supplement provides 360 kcal and 14 grams of protein  - Continue Pro-stat 30 ml po BID, each supplement provides 100 kcal and 15 grams of protein  - Continue MVI with minerals daily  - Encourage adequate PO intake  NUTRITION DIAGNOSIS:   Increased nutrient needs related to acute illness, catabolic illness as evidenced by estimated needs.  Ongoing, being addressed via supplements  GOAL:   Patient will meet greater than or equal to 90% of their needs  Progressing  MONITOR:   PO intake, Supplement acceptance, Labs, Weight trends, Skin  REASON FOR ASSESSMENT:   Malnutrition Screening Tool    ASSESSMENT:   29 year old female with PMH of chronic back pain, morbid obesity, eczema, sleep apnea, T2DM. Presented 01/26/19 with progressive SOB with sinus congestion, fatigue, malaise, low-grade fever and chills. In the ED, pt found to be COVID-19 positive. Chest x-ray showing no active disease however follow-up chest x-ray showed development of bilateral heterogeneous lung opacities consistent with COVID-19 pneumonia. Pt admitted to CIR on 1/08.  Noted target d/c date of 03/03/19.  Pt accepting ~50% of Boost Plus supplements and 100% of Pro-stat supplements per [facility] documentation.  Weight down 9 lbs since admission to CIR. Will continue to monitor trends.  RD was unable to reach pt via phone call to room. Will continue with current nutrition interventions at this time.  Meal Completion: 0-100% (variable, averaging 84%)  Medications reviewed and include: Pepcid, Pro-stat BID, Lasix, SSI, Novolog 6 units TID, Levemir 35 units BID, Boost Plus TID, MVI with minerals, protonix, Klor-con  Labs reviewed. CBG's: 113-161 x 24 hours  NUTRITION - FOCUSED PHYSICAL EXAM:  Unable to complete at this time. RD working remotely.  Diet Order:    Diet Order            Diet Carb Modified Fluid consistency: Thin; Room service appropriate? Yes  Diet effective now              EDUCATION NEEDS:   Education needs have been addressed  Skin:  Skin Assessment: Skin Integrity Issues: Stage II: right buttocks, left buttocks Unstageable: left ankle Other: skin tear to back  Last BM:  02/25/19  Height:   Ht Readings from Last 1 Encounters:  01/29/19 5' 9"  (1.753 m)    Weight:   Wt Readings from Last 1 Encounters:  02/26/19 (!) 147.4 kg    Ideal Body Weight:  65.9 kg  BMI:  Body mass index is 47.99 kg/m.  Estimated Nutritional Needs:   Kcal:  2600-2800  Protein:  130-150 grams  Fluid:  >/= 2.2 L    Gaynell Face, MS, RD, LDN Inpatient Clinical Dietitian Pager: [phone] Weekend/After Hours: [phone]

## 2019-02-26 NOTE — Progress Notes (Signed)
[place] PHYSICAL MEDICINE & REHABILITATION PROGRESS NOTE   Subjective/Complaints: Not tearful, discussed BP, HR.  Pt stqtes she has white coat hypertension, but that her HR stays about 100-110 at home  ROS no CP, SOB, N/V/D  Objective:   No results found. No results for input(s): WBC, HGB, HCT, PLT in the last 72 hours. No results for input(s): NA, K, CL, CO2, GLUCOSE, BUN, CREATININE, CALCIUM in the last 72 hours.  Intake/Output Summary (Last 24 hours) at 02/26/2019 0742 Last data filed at 02/25/2019 1921 Gross per 24 hour  Intake 368 ml  Output --  Net 368 ml     Physical Exam: Vital Signs Blood pressure (!) 142/112, pulse (!) 117, temperature 97.9 F (36.6 C), temperature source Oral, resp. rate 20, weight (!) 147.4 kg, SpO2 95 %. General: No acute distress Mood and affect are appropriate Heart: Regular rate and rhythm no rubs murmurs or extra sounds Lungs: Clear to auscultation, breathing unlabored, no rales or wheezes Abdomen: Positive bowel sounds, soft nontender to palpation, nondistended Extremities: No clubbing, cyanosis, or edema, normal pedal pulses, feet are warm Skin: No evidence of breakdown, no evidence of rash No ulceration or skin color change in feet  Neurologic: Cranial nerves II through XII intact, motor strength is 5/5 in bilateral deltoid, bicep, tricep, grip, hip flexor, knee extensors, ankle dorsiflexor and plantar flexor Sensory exam reduced  sensation to light touch in dorsum of both feet Cerebellar exam normal finger to nose to finger as well as heel to shin in bilateral upper and lower extremities Musculoskeletal: Full range of motion in all 4 extremities. No joint swelling  Assessment/Plan: 1. Functional deficits secondary to debility post resp failure  which require 3+ hours per day of interdisciplinary therapy in a comprehensive inpatient rehab setting.  Physiatrist is providing close team supervision and 24 hour management of active medical  problems listed below.  Physiatrist and rehab team continue to assess barriers to discharge/monitor patient progress toward functional and medical goals  Care Tool:  Bathing    Body parts bathed by patient: Right arm, Left arm, Chest, Abdomen, Front perineal area, Buttocks, Right upper leg, Left upper leg, Right lower leg, Left lower leg, Face   Body parts bathed by helper: Buttocks, Right lower leg, Left lower leg     Bathing assist Assist Level: Supervision/Verbal cueing     Upper Body Dressing/Undressing Upper body dressing   What is the patient wearing?: Pull over shirt    Upper body assist Assist Level: Set up assist    Lower Body Dressing/Undressing Lower body dressing      What is the patient wearing?: Pants, Underwear/pull up     Lower body assist Assist for lower body dressing: Contact Guard/Touching assist     Toileting Toileting    Toileting assist Assist for toileting: Minimal Assistance - Patient > 75% Assistive Device Comment: walker   Transfers Chair/bed transfer  Transfers assist     Chair/bed transfer assist level: Supervision/Verbal cueing Chair/bed transfer assistive device: Programmer, multimedia   Ambulation assist      Assist level: Supervision/Verbal cueing Assistive device: Walker-rolling Max distance: 100   Walk 10 feet activity   Assist  Walk 10 feet activity did not occur: Safety/medical concerns  Assist level: Supervision/Verbal cueing Assistive device: Walker-rolling   Walk 50 feet activity   Assist Walk 50 feet with 2 turns activity did not occur: Safety/medical concerns  Assist level: Supervision/Verbal cueing Assistive device: Walker-rolling    Walk  150 feet activity   Assist Walk 150 feet activity did not occur: Safety/medical concerns         Walk 10 feet on uneven surface  activity   Assist Walk 10 feet on uneven surfaces activity did not occur: Safety/medical concerns          Wheelchair     Assist Will patient use wheelchair at discharge?: No Type of Wheelchair: Manual    Wheelchair assist level: Supervision/Verbal cueing Max wheelchair distance: 100    Wheelchair 50 feet with 2 turns activity    Assist        Assist Level: Supervision/Verbal cueing   Wheelchair 150 feet activity     Assist          Blood pressure (!) 142/112, pulse (!) 117, temperature 97.9 F (36.6 C), temperature source Oral, resp. rate 20, weight (!) 147.4 kg, SpO2 95 %.    Medical Problem List and Plan: 1.  Debility secondary to acute on chronic respiratory failure with hypoxia related to COVID-19         Team conference today please see physician documentation under team conference tab, met with team face-to-face to discuss problems,progress, and goals. Formulized individual treatment plan based on medical history, underlying problem and comorbidities.             -ELOS/Goals: 14-18 days, ModI PT, OT, I in SLP 2.  Antithrombotics: -DVT/anticoagulation: Lovenox.  Doppler studies negative             -antiplatelet therapy: N/A 3. Pain Management/chronic back pain:              Neuropathic pain is severe likely related to uncontrolled diabetes with worsening after critical illness, constant, and inhibiting her participation in PT , inhibits sleep, has anxiety component as well   No significant relief with gabapentin up to 16oomg per day,will increase to 2447m/d helpful  Started cymbalta 28m1/11  Anxiety related to pain but not receiving PRN Ativan more than ~1x per day - will schedule QID No relief with tramadol 5027mut had some "soothing of pain" with hydrocodone 5mg64m 1 x dose, we discussed resp depression with this med but this can be monitored in hospital setting - will schedule TID and qhs Will plan to d/c on tramadol rather than hydrocodone   4. Mood: Provide emotional support- will need neuropsych eval              -antipsychotic agents: N/A 5.  Neuropsych: This patient is capable of making decisions on her own behalf. 6. Skin/Wound Care/left ankle wound: Hydrocolloid dressing left ankle daily 7. Fluids/Electrolytes/Nutrition: Routine in and outs with follow-up chemistries 8.  Diabetes mellitus.  Hemoglobin A1c 11.1 12/14.  NovoLog 6 units 3 times daily, Levemir 35 units twice daily.  CBG (last 3)  Recent Labs    02/25/19 1635 02/25/19 2112 02/26/19 0601  GLUCAP 161* 117* 119*  diabetes controlled  9.  Acute systolic congestive heart failure.  Continue Lasix 40 mg daily.  Monitor for any signs of fluid overload, ECHO 12/26 Ejfx 45% 10.  Morbid obesity.  BMI 47.85.  Dietary follow-up. 11. Education: educated regarding the importance of using the incentive spirometer hourly. Patient is currently achieving 1250. Recommended that she monitor her progress over time.   12.  ID- 27d  post Covid diagnosis was on airborne and contact prec at [facility] coughing , no trach, >21 d post Covid diagnosis, no C diff Wi;; d/c airborne and contact prec,  ran this by pulmonary on call who feels this is reasonable  13.  Mild anemia baseline ~13 will check stool guaic   1/11: Hgb 11.8 to 11.3    LOS: 6 days A FACE TO FACE EVALUATION WAS PERFORMED  Charlett Blake 02/26/2019, 7:42 AM

## 2019-02-27 ENCOUNTER — Inpatient Hospital Stay (HOSPITAL_COMMUNITY): Payer: BC Managed Care – PPO | Admitting: Physical Therapy

## 2019-02-27 LAB — CREATININE, SERUM
Creatinine, Ser: 0.75 mg/dL (ref 0.44–1.00)
GFR calc Af Amer: >60 mL/min (ref >60)
GFR calc non Af Amer: >60 mL/min (ref >60)

## 2019-02-27 LAB — GLUCOSE, CAPILLARY
Glucose-Capillary: 116 mg/dL — ABNORMAL HIGH (ref 70–99)
Glucose-Capillary: 161 mg/dL — ABNORMAL HIGH (ref 70–99)
Glucose-Capillary: 123 mg/dL — ABNORMAL HIGH (ref 70–99)
Glucose-Capillary: 107 mg/dL — ABNORMAL HIGH (ref 70–99)

## 2019-02-27 MED ORDER — GABAPENTIN 400 MG PO CAPS
700.0000 mg | ORAL_CAPSULE | Freq: Three times a day (TID) | ORAL | Status: DC
  Administered 2019-02-27 – 2019-03-02 (×12): 700 mg via ORAL
  Filled 2019-02-27 (×12): qty 1

## 2019-02-27 NOTE — Progress Notes (Signed)
Recreational Therapy Discharge Summary Patient Details  Name: INDIE BOEHNE MRN: [id number] Date of Birth: 1990-10-07 Today's Date: 02/27/2019  Long term goals set: 1  Long term goals met: 1  Comments on progress toward goals: Pt is scheduled for discharge home with family 1/19.  TR sessions focused on leisure education, activity analysis with potential modifications & relaxation training.  Pt provided with handout for home use on diaphramatic breathing.  Pt is independent with use.  Pt is excited about upcoming discharge and being with her family which she states will assist in anxiety management.  Reasons for discharge: discharge from hospital  Patient/family agrees with progress made and goals achieved: Yes  Arvel Oquinn 02/27/2019, 11:48 AM

## 2019-02-27 NOTE — Progress Notes (Addendum)
Recreational Therapy Session Note  Patient Details  Name: Anita Hunter MRN: [id number] Date of Birth: Dec 16, 1990 Today's Date: 02/27/2019  Pain:c/o B feet burning, unrated- soaking feet Skilled Therapeutic Interventions/Progress Updates: Met briefly today with pt to provide/review handout on  diaphragmatic breathing exercise for continued use at home.  Pt is Independent with this.  Pt stated appreciation of information and is looking forward to returning home with her family in a few days.  Therapy/Group: Individual Therapy  Naijah Lacek 02/27/2019, 11:43 AM

## 2019-02-27 NOTE — Progress Notes (Signed)
Physical Therapy Session Note  Patient Details  Name: Anita Hunter MRN: [id number] Date of Birth: May 01, 1990  Today's Date: 02/27/2019 PT Individual Time: 1025-1125 PT Individual Time Calculation (min): 60 min   Short Term Goals: Week 1:  PT Short Term Goal 1 (Week 1): Pt will perform bed to wc to bed w/cga consistently w/LRAD PT Short Term Goal 2 (Week 1): pt will ambulate 50f w/RW and cga. PT Short Term Goal 3 (Week 1): pt will perform all bed mobility w/supervision  Skilled Therapeutic Interventions/Progress Updates: Pt presents sitting in arm chair.  Assisted w/ donning shoes over ace wraps bilaterally.  Pt amb up to 125' to therapy gym w/ w/c follow.  Pt performed multiple sit to stand transfers w/ supervision, occasional verbal cues for hand placement.  Pt performed standing bouncing ball, toe taps to 3 3/4" platform 3 x 15 w/ 1 LOB and sit down on mat table, shoulder flex overhead and shoulder press w/ SPC and standing on Airex surface w/o LOB 3 x 10-15.  Pt requires seated rest breaks d/t O2 sats decreased to 88% on RA and HR to 112.  Pt negotiated ramped surface and across mulch area to challenge balance.  Pt amb approx. 150' to stairs.  Pt negotiated 4 steps w/ right rail and slight assist of left as well as min to CGA.  Verbal cues given for proper foot placement during step-to negotiation.  Pt c/o increased pain and returned to room for cool foot baths.  Patient amb into BR and back to chair.  Removed ace wraps and left w/ cool baths to comfort.  All needs within reach and chair alarm set.     Therapy Documentation Precautions:  Precautions Precautions: Fall Precaution Comments: monitor 02 sats, HR Restrictions Weight Bearing Restrictions: No General:   Vital Signs:  Pain: pt states greater than 10/10 pain bilateral feet, especially great toe, meds given prior to rx.   :      Therapy/Group: Individual Therapy  JLadoris Gene1/15/2021, 12:30 PM

## 2019-02-27 NOTE — Procedures (Signed)
Patient declined BIPAP for the night.

## 2019-02-27 NOTE — Progress Notes (Signed)
Occupational Therapy Session Note  Patient Details  Name: Anita Hunter MRN: [id number] Date of Birth: 1990/06/14  Today's Date: 02/27/2019 OT Individual Time: 1420-1535 OT Individual Time Calculation (min): 75 min   Short Term Goals: Week 1:  OT Short Term Goal 1 (Week 1): LTG=STG 2/2 ELOS  Skilled Therapeutic Interventions/Progress Updates:    Pt greeted while taking a nap, resting horizontally across bed with LEs on the floor. She reports this is how she typically rests at [facility] for comfort reasons. Pt amenable to tx, wanting to start session by brushing her teeth. First donned footwear including regular socks + shoes, then just shoes, then just gripper socks, and finally Teds + shoes. Pt reporting B foot discomfort, which is less irritating to her with Teds + shoes combination. Pt able to don shoes herself but needed A for Teds. She ambulated without device in room with steady assist, noted a few small LOBs which she was able to correct herself. She stood at the sink to complete oral care with B elbows propped on the counter. Pt was then agreeable to go to the [facility] for [facility]. Escorted her via w/c with pt pushing walker. Worked on dynamic standing balance, activity tolerance, and functional ambulation with RW while she shopped. Pt removing unilateral support from walker to reach for items on display cases or look at clothing, ambulating with supervision. She self propelled w/c in the food court to work on UB strengthening and w/c navigation around natural obstacles. Pt played the piano which appeared to enhance affect. She returned to room via w/c and then ambulated back to bed using RW with supervision assist. Left her resting horizontally in bed with feet on floor, wrapped up in a wet towel for pain relief. Call bell within reach.   Therapy Documentation Precautions:  Precautions Precautions: Fall Precaution Comments: monitor 02 sats, HR Restrictions Weight Bearing  Restrictions: No Pain: Numbness in B feet. Wrapped feet in a cold towel at end of session for pain relief   ADL: ADL Eating: Set up Grooming: Contact guard Upper Body Bathing: Supervision/safety Lower Body Bathing: Moderate assistance Upper Body Dressing: Minimal assistance Lower Body Dressing: Moderate assistance Toileting: Moderate assistance Toilet Transfer: Moderate assistance      Therapy/Group: Individual Therapy  Keilee Denman A Gaytha Raybourn 02/27/2019, 4:09 PM

## 2019-02-27 NOTE — Progress Notes (Signed)
PHYSICAL MEDICINE & REHABILITATION PROGRESS NOTE   Subjective/Complaints: No issues overnite except poor sleep, pain somewhat improved, not crying this am  ROS no CP, SOB, N/V/D  Objective:   No results found. No results for input(s): WBC, HGB, HCT, PLT in the last 72 hours. Recent Labs    02/27/19 0646  CREATININE 0.75    Intake/Output Summary (Last 24 hours) at 02/27/2019 0859 Last data filed at 02/27/2019 0745 Gross per 24 hour  Intake 899 ml  Output --  Net 899 ml     Physical Exam: Vital Signs Blood pressure (!) 111/53, pulse 98, temperature 98.5 F (36.9 C), resp. rate 18, weight (!) 147.4 kg, SpO2 100 %. General: No acute distress Mood and affect are appropriate Heart: Regular rate and rhythm no rubs murmurs or extra sounds Lungs: Clear to auscultation, breathing unlabored, no rales or wheezes Abdomen: Positive bowel sounds, soft nontender to palpation, nondistended Extremities: No clubbing, cyanosis, or edema, normal pedal pulses, feet are warm Skin: No evidence of breakdown, no evidence of rash No ulceration or skin color change in feet  Neurologic: Cranial nerves II through XII intact, motor strength is 5/5 in bilateral deltoid, bicep, tricep, grip, hip flexor, knee extensors, ankle dorsiflexor and plantar flexor Sensory exam reduced  sensation to light touch in dorsum of both feet Cerebellar exam normal finger to nose to finger as well as heel to shin in bilateral upper and lower extremities Musculoskeletal: Full range of motion in all 4 extremities. No joint swelling  Assessment/Plan: 1. Functional deficits secondary to debility post resp failure  which require 3+ hours per day of interdisciplinary therapy in a comprehensive inpatient rehab setting.  Physiatrist is providing close team supervision and 24 hour management of active medical problems listed below.  Physiatrist and rehab team continue to assess barriers to discharge/monitor patient  progress toward functional and medical goals  Care Tool:  Bathing    Body parts bathed by patient: Right arm, Left arm, Chest, Abdomen, Front perineal area, Buttocks, Right upper leg, Left upper leg, Right lower leg, Left lower leg, Face   Body parts bathed by helper: Buttocks, Right lower leg, Left lower leg     Bathing assist Assist Level: Supervision/Verbal cueing     Upper Body Dressing/Undressing Upper body dressing   What is the patient wearing?: Pull over shirt    Upper body assist Assist Level: Set up assist    Lower Body Dressing/Undressing Lower body dressing      What is the patient wearing?: Pants, Underwear/pull up     Lower body assist Assist for lower body dressing: Supervision/Verbal cueing     Toileting Toileting    Toileting assist Assist for toileting: Contact Guard/Touching assist Assistive Device Comment: walker   Transfers Chair/bed transfer  Transfers assist     Chair/bed transfer assist level: Supervision/Verbal cueing Chair/bed transfer assistive device: Programmer, multimedia   Ambulation assist      Assist level: Supervision/Verbal cueing Assistive device: Walker-rolling Max distance: 125'   Walk 10 feet activity   Assist  Walk 10 feet activity did not occur: Safety/medical concerns  Assist level: Supervision/Verbal cueing Assistive device: Walker-rolling   Walk 50 feet activity   Assist Walk 50 feet with 2 turns activity did not occur: Safety/medical concerns  Assist level: Supervision/Verbal cueing Assistive device: Walker-rolling    Walk 150 feet activity   Assist Walk 150 feet activity did not occur: Safety/medical concerns  Walk 10 feet on uneven surface  activity   Assist Walk 10 feet on uneven surfaces activity did not occur: Safety/medical concerns         Wheelchair     Assist Will patient use wheelchair at discharge?: No Type of Wheelchair: Manual    Wheelchair  assist level: Supervision/Verbal cueing Max wheelchair distance: 100    Wheelchair 50 feet with 2 turns activity    Assist        Assist Level: Supervision/Verbal cueing   Wheelchair 150 feet activity     Assist          Blood pressure (!) 111/53, pulse 98, temperature 98.5 F (36.9 C), resp. rate 18, weight (!) 147.4 kg, SpO2 100 %.    Medical Problem List and Plan: 1.  Debility secondary to acute on chronic respiratory failure with hypoxia related to COVID-19                 -D/C 1/19, [facility] PT, OT, I in [facility] 2.  Antithrombotics: -DVT/anticoagulation: Lovenox.  Doppler studies negative             -antiplatelet therapy: N/A 3. Pain Management/chronic back pain:              Neuropathic pain is severe likely related to uncontrolled diabetes with worsening after critical illness, constant, and inhibiting her participation in PT , inhibits sleep, has anxiety component as well   Some relief with gabapentin2425m/d increase to 27019mStarted cymbalta 2068m/11. Increase to 74m50m 1/18  Anxiety related to pain but not receiving PRN Ativan more than ~1x per day - will schedule QID- will not d/c on ativan No relief with tramadol 50mg78m had some "soothing of pain" with hydrocodone 5mg a39m x dose, we discussed resp depression with this med but this can be monitored in hospital setting - will schedule TID and qhs Will plan to d/c onT #3 1wk supply  rather than hydrocodone   4. Mood: Provide emotional support- will need neuropsych eval              -antipsychotic agents: N/A 5. Neuropsych: This patient is capable of making decisions on her own behalf. 6. Skin/Wound Care/left ankle wound: Hydrocolloid dressing left ankle daily 7. Fluids/Electrolytes/Nutrition: Routine in and outs with follow-up chemistries 8.  Diabetes mellitus.  Hemoglobin A1c 11.1 12/14.  NovoLog 6 units 3 times daily, Levemir 35 units twice daily.  CBG (last 3)  Recent Labs    02/26/19 1632  02/26/19 2102 02/27/19 0620  GLUCAP 148* 171* 116*  diabetes controlled  9.  Acute systolic congestive heart failure.  Continue Lasix 40 mg daily.  Monitor for any signs of fluid overload, ECHO 12/26 Ejfx 45% 10.  Morbid obesity.  BMI 47.85.  Dietary follow-up. 11. Education: educated regarding the importance of using the incentive spirometer hourly. Patient is currently achieving 1250. Recommended that she monitor her progress over time.   12.  ID- 27d  [facility] Covid [facility], >21 d post Covid diagnosis, no C diff Wi;; d/c airborne and contact prec, ran this by pulmonary on call who feels this is reasonable  13.  Mild anemia baseline ~13 will check stool guaic   1/11: Hgb 11.8 to 11.3    LOS: 7 days A FACE TO FACE EVALUATION WAS PERFORMED  AndrewCharlett Blake2021, 8:59 AM

## 2019-02-27 NOTE — Progress Notes (Signed)
Physical Therapy Session Note  Patient Details  Name: Anita Hunter MRN: [id number] Date of Birth: 1990/09/04  Today's Date: 02/27/2019 PT Individual Time: 0800-0900 PT Individual Time Calculation (min): 60 min   Short Term Goals: Week 1:  PT Short Term Goal 1 (Week 1): Pt will perform bed to wc to bed w/cga consistently w/LRAD PT Short Term Goal 2 (Week 1): pt will ambulate 4f w/RW and cga. PT Short Term Goal 3 (Week 1): pt will perform all bed mobility w/supervision  Skilled Therapeutic Interventions/Progress Updates:   Pt in w/c and agreeable to therapy, pain 8/10 in B feet (neuropathic pain), pt premedicated. Donned thigh high TEDs and shoes w/ total assist for time management. Ambulated to therapy gym w/ supervision using RW, pt notices improved R foot drop this session. Worked on balance, including gait w/o AD. 20' x2 w/ min-mod assist for balance. This was very fatiguing for pt but she was very excited. SpO2 >88% and HR 112 bpm after gait. Worked on standing balance w/ narrow BOS while reaching to pin clothespins into basketball hoop. Required pt to perform lateral weight shifting, mini squats, and anterior weight shifting. All w/ CGA and verbal cues for hip balance strategies. Pt w/ no scheduled OT today and wishing to shower. Ambulated back to room w/ supervision and performed upper and lower body bathing at seated level w/ set-up assist. Verbal reminders to utilize grab bars for safety. Clothing management w/ min assist in stance and increased time 2/2 fatigue. Ended session in chair, all needs in reach.   Therapy Documentation Precautions:  Precautions Precautions: Fall Precaution Comments: monitor 02 sats, HR Restrictions Weight Bearing Restrictions: No Vital Signs:   Therapy/Group: Individual Therapy  Tramayne Sebesta K Huxton Glaus 02/27/2019, 9:03 AM

## 2019-02-28 ENCOUNTER — Inpatient Hospital Stay (HOSPITAL_COMMUNITY): Payer: BC Managed Care – PPO | Admitting: Physical Therapy

## 2019-02-28 DIAGNOSIS — E1165 Type 2 diabetes mellitus with hyperglycemia: Secondary | ICD-10-CM

## 2019-02-28 DIAGNOSIS — D62 Acute posthemorrhagic anemia: Secondary | ICD-10-CM

## 2019-02-28 DIAGNOSIS — R000 Tachycardia, unspecified: Secondary | ICD-10-CM

## 2019-02-28 DIAGNOSIS — I5021 Acute systolic (congestive) heart failure: Secondary | ICD-10-CM

## 2019-02-28 DIAGNOSIS — M792 Neuralgia and neuritis, unspecified: Secondary | ICD-10-CM

## 2019-02-28 DIAGNOSIS — G6281 Critical illness polyneuropathy: Secondary | ICD-10-CM

## 2019-02-28 LAB — GLUCOSE, CAPILLARY
Glucose-Capillary: 129 mg/dL — ABNORMAL HIGH (ref 70–99)
Glucose-Capillary: 111 mg/dL — ABNORMAL HIGH (ref 70–99)
Glucose-Capillary: 102 mg/dL — ABNORMAL HIGH (ref 70–99)
Glucose-Capillary: 129 mg/dL — ABNORMAL HIGH (ref 70–99)

## 2019-02-28 NOTE — Progress Notes (Signed)
Physical Therapy Session Note  Patient Details  Name: Anita Hunter MRN: [id number] Date of Birth: 11-20-1990  Today's Date: 02/28/2019 PT Individual Time: 7340-3709 PT Individual Time Calculation (min): 60 min   Short Term Goals: Week 1:  PT Short Term Goal 1 (Week 1): Pt will perform bed to wc to bed w/cga consistently w/LRAD PT Short Term Goal 2 (Week 1): pt will ambulate 58f w/RW and cga. PT Short Term Goal 3 (Week 1): pt will perform all bed mobility w/supervision  Skilled Therapeutic Interventions/Progress Updates:    Pt received L sidelying, deep asleep in the bed requiring a few minutes to become fully awake. Pt agreeable to therapy session and requesting to use bathroom. Supine>sit, HOB flat and not using bedrail, with supervision. Therapist donned B LE TEDs and shoes max assist for time management. Sit<>stands throughout session using RW with close supervision and without AD with CGA. Ambulated in room to/from bathroom and to sink using RW with close supervision for safety - continues to demonstrate slow gait speed with increased R hip flexion during swing to clear foot. Sit<>stand on/off toilet using grab bar with close supervision - continent of bowels and bladder - performed LB clothing management and peri-care without assist. Standing at sink with RW performed hand hygiene and washed face with close supervision for safety. Ambulated ~1061fusing RW with close supervision for safety - continues to demonstrate R midfoot initial contact with lack of ankle DF during swing phase - 1x pt unable to clear R foot during swing having minor anterior LOB requiring CGA for steadying but able to use AD to recover. Dynamic balance and gait training~2545fithout AD with min assist for balance due to increased postural sway - pt walking with significantly decreased speed, significantly increased stance time with slow, deliberate stepping with decreased B LE step length and increased postural. Stand  pivot chair>EOM, no AD, with min assist for balance. Repeated sit<>stands without UE support 2 sets of 7 reps with CGA for safety and cuing for no rest break between reps. Dynamic standing balance and standing endurance task of shooting hoops with close supervision/CGA for safety - therapist retrieving ball. Dynamic balance and gait training ~10f30fo AD, with CGA/min assist for balance this time with pt demonstrating slightly increased gait speed but continues to have deliberate steps with increased stance time. R/L lateral side stepping at hallway rail [id number] direction without UE support until end of walks with pt needing support due to fatigue. Transported back to room in w/c. Stand pivot w/c>chair, no AD, with CGA for steadying. Pt left seated in chair with needs in reach and chair alarm on.   Monitored SpO2 and HR throughout session - HR continues to be around 120bpm and SpO2 around 90% on RA.     Therapy Documentation Precautions:  Precautions Precautions: Fall Precaution Comments: monitor 02 sats, HR Restrictions Weight Bearing Restrictions: No  Pain:   Continues to report burning pain in B feet, unrated, RN notified at end of session and bringing ice for pain management.    Therapy/Group: Individual Therapy  CarlyTawana Scale DPT 02/28/2019, 3:30 PM

## 2019-02-28 NOTE — Progress Notes (Signed)
[place] PHYSICAL MEDICINE & REHABILITATION PROGRESS NOTE   Subjective/Complaints: Patient seen sitting up in her chair eating breakfast this morning.  She states she slept well overnight.  She notes numbness on left dorsal foot and hyperalgesia with ambulation bilateral plantar feet.  ROS: + Neuropathic pain with activity.  Denies CP, shortness of breath, nausea, vomiting, diarrhea.  Objective:   No results found. No results for input(s): WBC, HGB, HCT, PLT in the last 72 hours. Recent Labs    02/27/19 0646  CREATININE 0.75    Intake/Output Summary (Last 24 hours) at 02/28/2019 0931 Last data filed at 02/28/2019 0735 Gross per 24 hour  Intake 702 ml  Output --  Net 702 ml     Physical Exam: Vital Signs Blood pressure 132/83, pulse (!) 117, temperature 97.7 F (36.5 C), temperature source Oral, resp. rate 20, weight (!) 148.5 kg, SpO2 99 %. Constitutional: No distress . Vital signs reviewed.  Morbidly obese. HENT: Normocephalic.  Atraumatic. Eyes: EOMI. No discharge. Cardiovascular: No JVD. Respiratory: Normal effort.  No stridor. GI: Non-distended. Skin: Warm and dry.  Intact. Psych: Normal mood.  Normal behavior. Musc: No edema in extremities.  No tenderness in extremities. Neurologic: Alert Motor: Bilateral upper extremities: 5/5 proximal distal Right lower extremity: 4 -/5 proximal distal (pain inhibition) Left lower extremity: Hip flexion, knee extension 4 medicine/5, able dorsiflexion 3/5 (pain inhibition)  Assessment/Plan: 1. Functional deficits secondary to critical illness polyneuropathy post resp failure  which require 3+ hours per day of interdisciplinary therapy in a comprehensive inpatient rehab setting.  Physiatrist is providing close team supervision and 24 hour management of active medical problems listed below.  Physiatrist and rehab team continue to assess barriers to discharge/monitor patient progress toward functional and medical goals  Care  Tool:  Bathing    Body parts bathed by patient: Right arm, Left arm, Chest, Abdomen, Front perineal area, Buttocks, Right upper leg, Left upper leg, Right lower leg, Left lower leg, Face   Body parts bathed by helper: Buttocks, Right lower leg, Left lower leg     Bathing assist Assist Level: Supervision/Verbal cueing     Upper Body Dressing/Undressing Upper body dressing   What is the patient wearing?: Pull over shirt    Upper body assist Assist Level: Set up assist    Lower Body Dressing/Undressing Lower body dressing      What is the patient wearing?: Pants, Underwear/pull up     Lower body assist Assist for lower body dressing: Supervision/Verbal cueing     Toileting Toileting    Toileting assist Assist for toileting: Contact Guard/Touching assist Assistive Device Comment: walker   Transfers Chair/bed transfer  Transfers assist     Chair/bed transfer assist level: Supervision/Verbal cueing Chair/bed transfer assistive device: Programmer, multimedia   Ambulation assist      Assist level: Supervision/Verbal cueing Assistive device: Walker-rolling Max distance: 125'   Walk 10 feet activity   Assist  Walk 10 feet activity did not occur: Safety/medical concerns  Assist level: Supervision/Verbal cueing Assistive device: Walker-rolling   Walk 50 feet activity   Assist Walk 50 feet with 2 turns activity did not occur: Safety/medical concerns  Assist level: Supervision/Verbal cueing Assistive device: Walker-rolling    Walk 150 feet activity   Assist Walk 150 feet activity did not occur: Safety/medical concerns         Walk 10 feet on uneven surface  activity   Assist Walk 10 feet on uneven surfaces activity did not occur:  Safety/medical concerns         Wheelchair     Assist Will patient use wheelchair at discharge?: No Type of Wheelchair: Manual    Wheelchair assist level: Supervision/Verbal cueing Max wheelchair  distance: 100    Wheelchair 50 feet with 2 turns activity    Assist        Assist Level: Supervision/Verbal cueing   Wheelchair 150 feet activity     Assist          Blood pressure 132/83, pulse (!) 117, temperature 97.7 F (36.5 C), temperature source Oral, resp. rate 20, weight (!) 148.5 kg, SpO2 99 %.    Medical Problem List and Plan: 1.    Critical illness polyneuropathy secondary to acute on chronic respiratory failure with hypoxia related to COVID-19  Continue CIR 2.  Antithrombotics: -DVT/anticoagulation: Lovenox.  Doppler studies negative             -antiplatelet therapy: N/A 3. Pain Management/chronic back pain:              Neuropathic pain is likely related to uncontrolled diabetes with worsening after critical illness, constant, and inhibiting her participation in PT , inhibits sleep, has anxiety component as well   Some relief with gabapentin2455m/d increased to 27025m  Started cymbalta 2045m/11. Increase to 34m53m Monday  Anxiety related to pain scheduled Ativan QID- will not d/c on ativan  Scheduled hydrocodone TID and qhs-Will plan to d/c onT #3 1wk supply  rather than hydrocodone   4. Mood: Provide emotional support- neuropsych eval              -antipsychotic agents: N/A 5. Neuropsych: This patient is capable of making decisions on her own behalf. 6. Skin/Wound Care/left ankle wound: Hydrocolloid dressing left ankle daily 7. Fluids/Electrolytes/Nutrition: Routine in and outs with follow-up chemistries 8.  Uncontrolled Diabetes mellitus with hyperglycemia.  Hemoglobin A1c 11.1 12/14.  NovoLog 6 units 3 times daily, Levemir 35 units twice daily.  CBG (last 3)  Recent Labs    02/27/19 1639 02/27/19 2108 02/28/19 0637  GLUCAP 123* 107* 129*   Labile on 1/16 9.  Acute systolic congestive heart failure.  Continue Lasix 40 mg daily.  Monitor for any signs of fluid overload, ECHO 12/26 Ejfx 45% Filed Weights   02/25/19 0339 02/26/19 0343  02/28/19 0619  Weight: (!) 148.7 kg (!) 147.4 kg (!) 148.5 kg   Stable on 1/16 10.  Morbid obesity.  BMI 47.85.  Dietary follow-up.  Encourage weight loss. 11. Education: educated regarding the importance of using the incentive spirometer hourly. Recommended that she monitor her progress over time.   12.  ID- 27d  post Covid diagnosis was on airborne and contact prec at [facility] coughing , no trach, >21 d post Covid diagnosis, no C diff  D/ced airborne and contact prec, ran this by pulmonary on call who feels this is reasonable  13.  Mild anemia baseline ~13   Hemoglobin 11.3 on 1/9, labs ordered for Monday 14.  Tachycardia-likely sinus multifactorial  ECG 12/22 personally reviewed, normal sinus rhythm, repeat ECG ordered  Will consider beta-blocker  LOS: 8 days A FACE TO FACE EVALUATION WAS PERFORMED  Barbi Kumagai AnilLorie Phenix6/2021, 9:31 AM

## 2019-03-01 ENCOUNTER — Ambulatory Visit (HOSPITAL_COMMUNITY): Payer: BC Managed Care – PPO

## 2019-03-01 ENCOUNTER — Encounter (HOSPITAL_COMMUNITY): Payer: BC Managed Care – PPO | Admitting: Occupational Therapy

## 2019-03-01 DIAGNOSIS — R000 Tachycardia, unspecified: Secondary | ICD-10-CM

## 2019-03-01 DIAGNOSIS — D649 Anemia, unspecified: Secondary | ICD-10-CM

## 2019-03-01 LAB — GLUCOSE, CAPILLARY
Glucose-Capillary: 103 mg/dL — ABNORMAL HIGH (ref 70–99)
Glucose-Capillary: 134 mg/dL — ABNORMAL HIGH (ref 70–99)
Glucose-Capillary: 139 mg/dL — ABNORMAL HIGH (ref 70–99)
Glucose-Capillary: 130 mg/dL — ABNORMAL HIGH (ref 70–99)

## 2019-03-01 MED ORDER — LABETALOL HCL 200 MG PO TABS
200.0000 mg | ORAL_TABLET | Freq: Two times a day (BID) | ORAL | Status: DC
  Administered 2019-03-01 – 2019-03-03 (×4): 200 mg via ORAL
  Filled 2019-03-01 (×4): qty 1

## 2019-03-01 MED ORDER — FUROSEMIDE 40 MG PO TABS
40.0000 mg | ORAL_TABLET | Freq: Two times a day (BID) | ORAL | Status: DC
  Administered 2019-03-01 – 2019-03-03 (×4): 40 mg via ORAL
  Filled 2019-03-01 (×5): qty 1

## 2019-03-01 NOTE — Progress Notes (Signed)
Pt requested the CPAP machine be taken out of the room since she doesn't use it.

## 2019-03-01 NOTE — Progress Notes (Signed)
[place] PHYSICAL MEDICINE & REHABILITATION PROGRESS NOTE   Subjective/Complaints: Patient seen sitting up in her chair this morning.  She states she slept well overnight.  She notes persistent neuropathic pain.  Discussed family education today.  ROS: + Neuropathic pain with activity, stable.  Denies CP, shortness of breath, nausea, vomiting, diarrhea.  Objective:   No results found. No results for input(s): WBC, HGB, HCT, PLT in the last 72 hours. Recent Labs    02/27/19 0646  CREATININE 0.75    Intake/Output Summary (Last 24 hours) at 03/01/2019 0837 Last data filed at 03/01/2019 0724 Gross per 24 hour  Intake 720 ml  Output --  Net 720 ml     Physical Exam: Vital Signs Blood pressure (!) 142/74, pulse (!) 110, temperature (!) 97.5 F (36.4 C), resp. rate 20, weight (!) 148.6 kg, SpO2 95 %. Constitutional: No distress . Vital signs reviewed.  Morbidly obese. HENT: Normocephalic.  Atraumatic. Eyes: EOMI. No discharge. Cardiovascular: No JVD. Respiratory: Normal effort.  No stridor. GI: Non-distended. Skin: Warm and dry.  Intact. Psych: Normal mood.  Normal behavior. Musc: Lower extremity edema Neurologic: Alert Motor: Bilateral upper extremities: 5/5 proximal distal Right lower extremity: 4 -/5 proximal distal (pain inhibition)  Left lower extremity: Hip flexion, knee extension 4-/5, able dorsiflexion 4-/5 (pain inhibition)  Assessment/Plan: 1. Functional deficits secondary to critical illness polyneuropathy post resp failure  which require 3+ hours per day of interdisciplinary therapy in a comprehensive inpatient rehab setting.  Physiatrist is providing close team supervision and 24 hour management of active medical problems listed below.  Physiatrist and rehab team continue to assess barriers to discharge/monitor patient progress toward functional and medical goals  Care Tool:  Bathing    Body parts bathed by patient: Right arm, Left arm, Chest, Abdomen,  Front perineal area, Buttocks, Right upper leg, Left upper leg, Right lower leg, Left lower leg, Face   Body parts bathed by helper: Buttocks, Right lower leg, Left lower leg     Bathing assist Assist Level: Supervision/Verbal cueing     Upper Body Dressing/Undressing Upper body dressing   What is the patient wearing?: Pull over shirt    Upper body assist Assist Level: Set up assist    Lower Body Dressing/Undressing Lower body dressing      What is the patient wearing?: Pants, Underwear/pull up     Lower body assist Assist for lower body dressing: Supervision/Verbal cueing     Toileting Toileting    Toileting assist Assist for toileting: Contact Guard/Touching assist Assistive Device Comment: walker   Transfers Chair/bed transfer  Transfers assist     Chair/bed transfer assist level: Contact Guard/Touching assist(no AD) Chair/bed transfer assistive device: Programmer, multimedia   Ambulation assist      Assist level: Minimal Assistance - Patient > 75% Assistive device: No Device Max distance: 77f   Walk 10 feet activity   Assist  Walk 10 feet activity did not occur: Safety/medical concerns  Assist level: Minimal Assistance - Patient > 75% Assistive device: No Device   Walk 50 feet activity   Assist Walk 50 feet with 2 turns activity did not occur: Safety/medical concerns  Assist level: Supervision/Verbal cueing Assistive device: Walker-rolling    Walk 150 feet activity   Assist Walk 150 feet activity did not occur: Safety/medical concerns         Walk 10 feet on uneven surface  activity   Assist Walk 10 feet on uneven surfaces activity did not occur:  Safety/medical concerns         Wheelchair     Assist Will patient use wheelchair at discharge?: No Type of Wheelchair: Manual    Wheelchair assist level: Supervision/Verbal cueing Max wheelchair distance: 100    Wheelchair 50 feet with 2 turns  activity    Assist        Assist Level: Supervision/Verbal cueing   Wheelchair 150 feet activity     Assist          Blood pressure (!) 142/74, pulse (!) 110, temperature (!) 97.5 F (36.4 C), resp. rate 20, weight (!) 148.6 kg, SpO2 95 %.    Medical Problem List and Plan: 1.    Critical illness polyneuropathy secondary to acute on chronic respiratory failure with hypoxia related to COVID-19  Continue CIR 2.  Antithrombotics: -DVT/anticoagulation: Lovenox.  Doppler studies negative             -antiplatelet therapy: N/A 3. Pain Management/chronic back pain:              Neuropathic pain is likely related to uncontrolled diabetes with worsening after critical illness, constant, and inhibiting her participation in PT , inhibits sleep, has anxiety component as well   Some relief with gabapentin2476m/d increased to 27044m  Started cymbalta 2039m/11. Increase to 68m83mmorrow  Anxiety related to pain scheduled Ativan QID- will not d/c on ativan  Scheduled hydrocodone TID and qhs-Will plan to d/c onT #3 1wk supply  rather than hydrocodone   4. Mood: Provide emotional support- neuropsych             -antipsychotic agents: N/A 5. Neuropsych: This patient is capable of making decisions on her own behalf. 6. Skin/Wound Care/left ankle wound: Hydrocolloid dressing left ankle daily 7. Fluids/Electrolytes/Nutrition: Routine in and outs with follow-up chemistries 8.  Uncontrolled Diabetes mellitus with hyperglycemia.  Hemoglobin A1c 11.1 12/14.  NovoLog 6 units 3 times daily, Levemir 35 units twice daily.  CBG (last 3)  Recent Labs    02/28/19 1647 02/28/19 2104 03/01/19 0604  GLUCAP 102* 129* 103*   Slightly labile on 1/17 9.  Acute systolic congestive heart failure.  Monitor for any signs of fluid overload, ECHO 12/26 Ejfx 45% Filed Weights   02/26/19 0343 02/28/19 0619 03/01/19 0550  Weight: (!) 147.4 kg (!) 148.5 kg (!) 148.6 kg   Lasix increased to 40 twice daily on  1/17  Stable on 1/16 10.  Morbid obesity.  BMI 47.85.  Dietary follow-up.  Encourage weight loss. 11. Education: educated regarding the importance of using the incentive spirometer hourly.   12.  ID- 27d  post Covid diagnosis was on airborne and contact prec at [facility] coughing , no trach, >21 d post Covid diagnosis, no C diff  D/ced airborne and contact prec, ran this by pulmonary on call who feels this is reasonable  13.  Mild anemia baseline ~13   Hemoglobin 11.3 on 1/9, labs ordered for tomorrow 14.  Tachycardia-likely sinus multifactorial  ECG 12/22 personally reviewed, normal sinus rhythm, repeat ECG reviewed, showing sinus tachycardia  Labetalol increased to 200 twice daily on 1/17  LOS: 9 days A FACE TO FACE EVALUATION WAS PERFORMED  Merlin Golden AnilLorie Phenix7/2021, 8:37 AM

## 2019-03-01 NOTE — Progress Notes (Signed)
Pt c/o of 10/10 bilateral foot pain. Pt has applied ice and soaked feet in cold water that took pain 8/10. Pt describes pain as burning/pins & needles. Heat was offered to the pt, pt denied.   Pt continues to fall asleep while sitting up/mid sentence.   Once pt is woke up, she is able to stay awake 1-2 mins before falling into deep sleep again.

## 2019-03-01 NOTE — Discharge Summary (Signed)
Physician Discharge Summary  Patient ID: Anita Hunter MRN: [id number] DOB/AGE: Dec 12, 1990 29 y.o.  Admit date: 02/20/2019 Discharge date: 03/03/2019  Discharge Diagnoses:  Principal Problem:   Debility Active Problems:   Pressure injury of skin   Critical illness polyneuropathy (HCC)   Tachycardia   Acute blood loss anemia   Acute systolic congestive heart failure ([place])   Uncontrolled type 2 diabetes mellitus with hyperglycemia (HCC)   Neuropathic pain   Sinus tachycardia   Anemia DVT prophylaxis Pain management Morbid obesity  Discharged Condition: Stable  Significant Diagnostic Studies: DG CHEST PORT 1 VIEW  Result Date: 02/11/2019 CLINICAL DATA:  Intubated, COVID-19 EXAM: PORTABLE CHEST 1 VIEW COMPARISON:  02/09/2019 chest radiograph. FINDINGS: Endotracheal tube tip is 3.8 cm above the carina. Right PICC enters the SVC with the tip not well visualized on this radiograph. Enteric tube enters stomach with the tip not seen on this image. Stable cardiomediastinal silhouette with normal heart size. No pneumothorax. No pleural effusion. Worsening patchy opacities throughout both lungs. IMPRESSION: 1. Well-positioned support structures.  No pneumothorax. 2. Worsening patchy opacities throughout both lungs compatible with pneumonia. Electronically Signed   By: Ilona Sorrel M.D.   On: 02/11/2019 10:25   DG Chest Port 1 View  Result Date: 02/09/2019 CLINICAL DATA:  Acute respiratory failure. EXAM: PORTABLE CHEST 1 VIEW COMPARISON:  02/08/2019 FINDINGS: The endotracheal tube is 3.3 cm above the carina. The right PICC line tip is in the right atrium. The NG tube is coursing down the esophagus and into the stomach. Persistent lower lobe predominant interstitial and airspace process in the lungs. No pleural effusions. No pneumothorax. IMPRESSION: 1. Stable support apparatus. 2. Persistent lower lobe predominant interstitial and airspace process in the lungs. Electronically Signed   By: Marijo Sanes M.D.   On: 02/09/2019 05:16   DG Chest Port 1 View  Result Date: 02/08/2019 CLINICAL DATA:  Acute respiratory failure. EXAM: PORTABLE CHEST 1 VIEW COMPARISON:  02/07/2019 and 02/06/2019 FINDINGS: Endotracheal tube has tip 3.4 cm above the carina. Enteric tube courses into the region of the stomach and off the film as tip is not visualized. Right-sided PICC line unchanged. Lungs are somewhat hypoinflated demonstrate persistent hazy airspace opacification over the mid to lower lungs. No effusion. Cardiomediastinal silhouette and remainder of the exam is unchanged. IMPRESSION: Persistent hazy airspace density over the mid to lower lungs which may be due to infection versus edema. Tubes and lines as described. Electronically Signed   By: Marin Olp M.D.   On: 02/08/2019 06:54   DG Chest Port 1 View  Result Date: 02/07/2019 CLINICAL DATA:  Acute respiratory failure with hypoxia. EXAM: PORTABLE CHEST 1 VIEW COMPARISON:  02/06/2019 FINDINGS: Endotracheal tube has tip 3.4 cm above the carina. Enteric tube courses into the stomach as side-port is seen over the stomach in the left upper quadrant and tip not visualized. Right-sided PICC line has tip over the SVC. Lungs are somewhat hypoinflated as there are prominent overlying soft tissues. Stable hazy bilateral airspace opacification over the lungs left worse than right. Stable cardiomegaly. Remainder of the exam is unchanged. IMPRESSION: 1. Stable bilateral hazy airspace process which may be due to infection or edema. Stable cardiomegaly. 2.  Tubes and lines as described Electronically Signed   By: Marin Olp M.D.   On: 02/07/2019 08:04   DG Chest Port 1 View  Result Date: 02/06/2019 CLINICAL DATA:  ? Vomiting vs dislodged NGT with aspiration. Assess for new infiltrate EXAM: PORTABLE CHEST 1  VIEW COMPARISON:  Radiographs 02/04/2019, 02/01/2019 and 01/31/2019. FINDINGS: 1658 hours. Two views obtained. Tip of the endotracheal tube is stable,  approximately 3 cm above the carina. Nasogastric tube projects below the diaphragm into the stomach. A right arm PICC is not well visualized, although appears unchanged, terminating at the level of the upper right atrium. The heart size and mediastinal contours are stable. There are diffuse bilateral airspace opacities which have slightly worsened compared with the most recent study of 2 days ago. No pneumothorax or significant pleural effusion identified. The bones appear intact. IMPRESSION: 1. Worsened diffuse bilateral airspace opacities since the most recent study of 2 days ago. 2. Stable support system, positioned as above. Electronically Signed   By: Richardean Sale M.D.   On: 02/06/2019 18:34   DG Chest Port 1 View  Result Date: 02/04/2019 CLINICAL DATA:  Acute respiratory failure with hypoxia EXAM: PORTABLE CHEST 1 VIEW COMPARISON:  Three days ago FINDINGS: Endotracheal tube tip at the clavicular heads. The enteric tube at least reaches the stomach. Right PICC with tip at the upper cavoatrial junction. Bilateral airspace disease asymmetric to the left. There has been improvement from prior. Lung volumes are low. No effusion or pneumothorax. Prominent heart size accentuated by low lung volumes. IMPRESSION: 1. Unremarkable hardware positioning. 2. Improved aeration although lung volumes remain low. Electronically Signed   By: Monte Fantasia M.D.   On: 02/04/2019 08:43   ECHOCARDIOGRAM COMPLETE  Result Date: 02/06/2019   ECHOCARDIOGRAM REPORT   Patient Name:   Anita Hunter Date of Exam: 02/06/2019 Medical Rec #:  [id number]         Height:       69.0 in Accession #:    [id number]        Weight:       341.5 lb Date of Birth:  07-04-1990          BSA:          2.59 m Patient Age:    28 years          BP:           128/70 mmHg Patient Gender: F                 HR:           83 bpm. Exam Location:  Inpatient Procedure: 2D Echo Indications:    Dyspnea 786.09 / R06.00  History:        Patient has no  prior history of Echocardiogram examinations.                 Risk Factors:Sleep Apnea and Diabetes. Covid pneumonia.  Sonographer:    Darlina Sicilian RDCS Referring Phys: [place]  Sonographer Comments: Image acquisition challenging due to patient body habitus. [place] IMPRESSIONS  1. Left ventricular ejection fraction, by visual estimation, is 40 to 45%. The left ventricle has mildly decreased function. There is no left ventricular hypertrophy. LV not seen well at all. EF appears mildly reduced but may be low normal. Consider repeat study with Definity contrast.  2. Global right ventricle has mildly reduced systolic function.The right ventricular size is mildly enlarged. No increase in right ventricular wall thickness.  3. Left atrial size was normal.  4. Right atrial size was not well visualized.  5. The mitral valve is normal in structure. No evidence of mitral valve regurgitation.  6. The tricuspid valve is normal in structure.  7. The aortic valve is normal  in structure. Aortic valve regurgitation is not visualized.  8. The pulmonic valve was grossly normal. Pulmonic valve regurgitation is not visualized.  9. The interatrial septum was not well visualized. FINDINGS  Left Ventricle: Left ventricular ejection fraction, by visual estimation, is 40 to 45%. The left ventricle has mildly decreased function. The left ventricle is not well visualized. There is no left ventricular hypertrophy. Left ventricular diastolic parameters were normal. Right Ventricle: The right ventricular size is mildly enlarged. No increase in right ventricular wall thickness. Global RV systolic function is has mildly reduced systolic function. Left Atrium: Left atrial size was normal in size. Right Atrium: Right atrial size was not well visualized Pericardium: There is no evidence of pericardial effusion. Mitral Valve: The mitral valve is normal in structure. No evidence of mitral valve regurgitation. Tricuspid Valve: The  tricuspid valve is normal in structure. Tricuspid valve regurgitation is trivial. Aortic Valve: The aortic valve is normal in structure. Aortic valve regurgitation is not visualized. Pulmonic Valve: The pulmonic valve was grossly normal. Pulmonic valve regurgitation is not visualized. Pulmonic regurgitation is not visualized. Aorta: The aortic root and ascending aorta are structurally normal, with no evidence of dilitation. IAS/Shunts: The interatrial septum was not well visualized.  LEFT VENTRICLE PLAX 2D LVIDd:         5.04 cm  Diastology LVIDs:         3.69 cm  LV e' lateral:   10.40 cm/s LV PW:         1.04 cm  LV E/e' lateral: 6.8 LV IVS:        1.15 cm  LV e' medial:    8.05 cm/s LVOT diam:     2.20 cm  LV E/e' medial:  8.8 LV SV:         63 ml LV SV Index:   22.13 LVOT Area:     3.80 cm  LEFT ATRIUM         Index LA diam:    3.80 cm 1.47 cm/m  AORTIC VALVE LVOT Vmax:   97.90 cm/s LVOT Vmean:  66.600 cm/s LVOT VTI:    0.150 m  AORTA Ao Root diam: 2.60 cm Ao Asc diam:  2.50 cm MITRAL VALVE MV Area (PHT): 3.39 cm             SHUNTS MV PHT:        64.96 msec           Systemic VTI:  0.15 m MV Decel Time: 224 msec             Systemic Diam: 2.20 cm MV E velocity: 71.00 cm/s 103 cm/s MV A velocity: 44.50 cm/s 70.3 cm/s MV E/A ratio:  1.60       1.5  Glori Bickers MD Electronically signed by Glori Bickers MD Signature Date/Time: 02/06/2019/3:26:07 PM    Final    VAS [place] LOWER EXTREMITY VENOUS (DVT)  Result Date: 02/03/2019  Lower Venous Study Indications: SOB. Other Indications: Covid positive. Limitations: Body habitus. Performing Technologist: Antonieta Pert RDMS, RVT  Examination Guidelines: A complete evaluation includes B-mode imaging, spectral Doppler, color Doppler, and power Doppler as needed of all accessible portions of each vessel. Bilateral testing is considered an integral part of a complete examination. Limited examinations for reoccurring indications may be performed as noted.   +---------+---------------+---------+-----------+----------+--------------+ RIGHT    CompressibilityPhasicitySpontaneityPropertiesThrombus Aging +---------+---------------+---------+-----------+----------+--------------+ CFV      Full           Yes  Yes                                 +---------+---------------+---------+-----------+----------+--------------+ SFJ      Full                                                        +---------+---------------+---------+-----------+----------+--------------+ FV Prox  Full                                                        +---------+---------------+---------+-----------+----------+--------------+ FV Mid   Full                                                        +---------+---------------+---------+-----------+----------+--------------+ FV DistalFull                                                        +---------+---------------+---------+-----------+----------+--------------+ PFV      Full                                                        +---------+---------------+---------+-----------+----------+--------------+ POP      Full           Yes      Yes                                 +---------+---------------+---------+-----------+----------+--------------+ PTV      Full                                                        +---------+---------------+---------+-----------+----------+--------------+ PERO     Full                                                        +---------+---------------+---------+-----------+----------+--------------+ GSV      Full                                                        +---------+---------------+---------+-----------+----------+--------------+   +---------+---------------+---------+-----------+----------+--------------+ LEFT     CompressibilityPhasicitySpontaneityPropertiesThrombus Aging  +---------+---------------+---------+-----------+----------+--------------+ CFV  Full           Yes      Yes                                 +---------+---------------+---------+-----------+----------+--------------+ SFJ      Full                                                        +---------+---------------+---------+-----------+----------+--------------+ FV Prox  Full                                                        +---------+---------------+---------+-----------+----------+--------------+ FV Mid   Full                                                        +---------+---------------+---------+-----------+----------+--------------+ FV DistalFull                                                        +---------+---------------+---------+-----------+----------+--------------+ PFV      Full                                                        +---------+---------------+---------+-----------+----------+--------------+ POP      Full           Yes      Yes                                 +---------+---------------+---------+-----------+----------+--------------+ PTV      Full                                                        +---------+---------------+---------+-----------+----------+--------------+ PERO     Full                                                        +---------+---------------+---------+-----------+----------+--------------+ GSV      Full                                                        +---------+---------------+---------+-----------+----------+--------------+  Summary: Right: There is no evidence of deep vein thrombosis in the lower extremity. No cystic structure found in the popliteal fossa. Left: There is no evidence of deep vein thrombosis in the lower extremity. No cystic structure found in the popliteal fossa.  *See table(s) above for measurements and observations. Electronically signed by  Deitra Mayo MD on 02/03/2019 at 5:21:57 PM.    Final     Labs:  Basic Metabolic Panel: Recent Labs  Lab 02/27/19 0646 03/02/19 0545  NA  --  138  K  --  4.2  CL  --  98  CO2  --  29  GLUCOSE  --  145*  BUN  --  7  CREATININE 0.75 0.69  CALCIUM  --  9.2    CBC: Recent Labs  Lab 03/02/19 0545  WBC 6.3  HGB 12.4  HCT 40.2  MCV 90.7  PLT 372    CBG: Recent Labs  Lab 03/01/19 2120 03/02/19 0607 03/02/19 1143 03/02/19 1705 03/02/19 2104  GLUCAP 130* 123* 113* 138* 100*   Family history.  Father with hypertension and migraines.  Brother with asthma.  Maternal grandmother with diabetes.  Maternal grandmother with hypertension.  Paternal grandmother with hypertension.  Denies any colon or rectal cancer.  Brief HPI:   Anita Hunter is a 29 y.o. right-handed female with history of chronic back pain, morbid obesity with BMI 47.85, eczema, sleep apnea, uncontrolled diabetes mellitus.  Per chart review lives with spouse independent prior to admission.  She works at a [employer].  Presented 01/26/2019 with progressive shortness of breath x2 days with sinus congestion, fatigue, malaise, low grade fever and chills.  She had a history of exposure to positive COVID-19 2 days before onset of symptoms.  In the ED SARS coronavirus positive, glucose 257, hemoglobin A1c 11.1, D-dimer 0.53.  Chest x-ray no active disease follow-up chest x-ray showed development of bilateral heterogeneous lung opacities consistent with COVID-19 pneumonia.  Patient did require high flow nasal cannula oxygen therapy.  She was placed on prednisone and tapered off as well as antivirals.  Blood culture showed no growth.  Patient had been maintained on BiPAP.  Bouts of hypokalemia and placed on supplement.  Hospital course complicated by E. coli UTI and again antibiotic treatment completed.  She was treated for acute systolic congestive heart failure maintained on Lasix monitoring hydration.  Tolerating a  regular diet.  Lovenox for DVT prophylaxis with venous Doppler studies negative.  Wound care nurse follow-up for left ankle wound advised hydrocolloid dressing daily.  Patient was admitted for a comprehensive rehab program   Hospital Course: OTTIS SARNOWSKI was admitted to rehab 02/20/2019 for inpatient therapies to consist of PT, ST and OT at least three hours five days a week. Past admission physiatrist, therapy team and rehab RN have worked together to provide customized collaborative inpatient rehab.  Pertaining to patient's critical illness polyneuropathy secondary to acute on chronic respiratory failure with hypoxia related to COVID-19.  She continued to participate with therapies.  Maintain on Lovenox for DVT prophylaxis with venous Doppler studies negative.  Pain management as well to use of chronic back pain neuropathic pain likely related to uncontrolled diabetes with worsening after critical illness constant.  Some relief with gabapentin titrated to 2700 mg she was started on Cymbalta increased to 30 mg.  As patient had since maxed out on Neurontin she was slowly transition to Lyrica with good results in her neuropathic pain.  A check of uric acid  mildly elevated 7.2 no presenting gout symptoms she was placed on low-dose Naprosyn.  Anxiety related to pain scheduled Ativan.  She was using hydrocodone as directed and discontinued due to bouts of constipation.  Plan will be to discharge to home on Tylenol 3 1 week supply rather than hydrocodone at home.  Uncontrolled diabetes mellitus hemoglobin A1c 11.3 diabetic teaching insulin ongoing.  She exhibited no other signs of fluid overload latest echocardiogram with ejection fraction of 45%.  Her Lasix had been increased to 40 mg twice daily.  Morbid obesity BMI 47.85 dietary follow-up.  In regards to Covid diagnosis was on airborne and contact precautions at [facility] no coughing no tracheostomy greater than 21-day post Covid diagnosis.  DC airborne  contact precautions.  This was discussed with pulmonary services.   Blood pressures were monitored on TID basis and controlled  Diabetes has been monitored with ac/hs CBG checks and SSI was use prn for tighter BS control.   She is continent of bowel and bladder.  She has made gains during rehab stay and is attending therapies  She will continue to receive follow up therapies   after discharge  Rehab course: During patient's stay in rehab weekly team conferences were held to monitor patient's progress, set goals and discuss barriers to discharge. At admission, patient required moderate assist stand pivot transfers, moderate assist sit to stand, moderate assist supine to sit.  Minimal assist for eating, mod assist grooming, max assist upper body bathing total assist lower body bathing total assist upper body dressing total assist lower body dressing moderate assist toilet transfers  Physical exam.  Blood pressure 132/68 pulse 106 temperature 98.2 respirations 20 oxygen saturation 90% room air General.  Alert oriented x3 HEENT.  Head is normocephalic eyes pupils round and reactive to light no discharge without nystagmus Neck.  Supple nontender no JVD without thyromegaly Cardiac regular rate rhythm without any extra sounds or murmur heard Respiratory effort normal no respiratory distress no wheezes GI.  Soft nontender positive bowel sounds without rebound Extremities.  No clubbing cyanosis or edema Neuro/musculoskeletal.  Bilateral upper extremities tremor present with some muscle testing motor function grossly graded 4+ out of 5 decreased sensation on the dorsum of bilateral feet worse on the left.  She  has had improvement in activity tolerance, balance, postural control as well as ability to compensate for deficits. She has had improvement in functional use RUE/LUE  and RLE/LLE as well as improvement in awareness.  Working with energy conservation techniques.  Supine to sit head of bed flat  not using bed rail with supervision.  Sit to stand throughout sessions using rolling walker close supervision and without assistive device with contact-guard assist.  Ambulates in room to and from bathroom to the sink using rolling walker close supervision.  Sit to stand on and off toilet using grab bar with close supervision.  Continent bowel and bladder.  Perform lower body clothing management pericare without assistance.  She can ambulate up to 125 feet.  She could gather belongings for activities the living homemaking she did need occasional rest breaks.  Full family teaching completed plan discharge to home       Disposition: Discharge to home    Diet: Diabetic diet  Special Instructions: No driving smoking or alcohol  Hydrocolloid dressing left ankle wound change daily   Medications at discharge. 1.  Tylenol as needed 2.  Capsaicin cream twice daily apply to both feet 3.  Cymbalta 30 mg daily 4.  Pepcid 20 mg p.o. daily 5.  Lyrica 75 mg 3 times daily 6.  Tylenol# 3 1 to 2 tablets 3 times daily prn x1 week supply 7.  NovoLog 6 units 3 times daily with meals 8.  Levemir 35 units twice daily 9.  Combivent inhaler 2 puffs every 6 hours as needed 10.  Labetalol 200 mg p.o. twice daily 11.  Multivitamin daily 12.  Protonix 40 mg p.o. daily 13.  Potassium chloride 40 mg p.o.  daily 14.Naprosyn 500 mg BID  15.Lasix 40 mgdaily  Follow-up Information    Kirsteins, Luanna Salk, MD Follow up.   Specialty: Physical Medicine and Rehabilitation Why: only as needed Contact information: [place] [address] [phone]           Signed: Cathlyn Parsons 03/03/2019, 5:21 AM

## 2019-03-01 NOTE — Progress Notes (Signed)
Physical Therapy Session Note  Patient Details  Name: Anita Hunter MRN: [id number] Date of Birth: [birth year]  Today's Date: 03/01/2019 PT Individual Time: 5284-1324 PT Individual Time Calculation (min): 45 min   and Today's Date: 03/01/2019 PT Missed Time: 15 Minutes Missed Time Reason: Pain  Short Term Goals: Week 1:  PT Short Term Goal 1 (Week 1): Pt will perform bed to wc to bed w/cga consistently w/LRAD PT Short Term Goal 2 (Week 1): pt will ambulate 60f w/RW and cga. PT Short Term Goal 3 (Week 1): pt will perform all bed mobility w/supervision  Skilled Therapeutic Interventions/Progress Updates:    Pt seated EOB upon PT arrival, agreeable to therapy tx and reports 10/10 pain in B feet (burning/cramping sensations), therapist provided ice at end of session for pain relief. Pt's mom present this session for family education. Discussed that pt will use RW at home for all ambulation for safety, no w/c recommended at this time secondary to pt's ability to ambulate >150 ft. Pt performed sit<>stands throughout session with supervision and RW, ambulated to/from gym today 2 x 150 ft with supervision and RW. Pt ambulated throughout rehab apartment and perform couch and recliner transfer with supervision. Pt ambulated to steps and ascended/descended 2 x 4 steps with R rail and CGA, step to pattern, per home set up, instructed pt's mom to fold RW and carry up/down steps for the pt. Discussed walking program for home to work on endurance, also discussed strengthening exercises. Pt ambulated to the mat and became tearful over her B feet pain, therapist and her mother provided emotional support. Pt left in w/c at end of session and left in care of her mom, ice applied. Pt missed 15 minutes this session secondary to 10/10 pain.   Therapy Documentation Precautions:  Precautions Precautions: Fall Precaution Comments: monitor 02 sats, HR Restrictions Weight Bearing Restrictions: No   Therapy/Group:  Individual Therapy  ENetta Corrigan PT, DPT 03/01/2019, 8:00 AM

## 2019-03-01 NOTE — Progress Notes (Signed)
Occupational Therapy Session Note  Patient Details  Name: Anita Hunter MRN: [id number] Date of Birth: 04-04-1990  Today's Date: 03/01/2019 OT Individual Time: 8115-7262 OT Individual Time Calculation (min): 75 min   Short Term Goals: Week 1:  OT Short Term Goal 1 (Week 1): LTG=STG 2/2 ELOS    Skilled Therapeutic Interventions/Progress Updates:    Pt greeted in w/c, requesting to shower. First ambulated to the dresser using RW with supervision assist. She directed OT to select certain clothes for her (2nd and 3rd drawers down), and then proceeded to TTB. Pt bathed while seated with setup assist, utilizing lean technique for perihygiene and LH sponge for reaching feet. Mother arrived during this time for family education. We discussed using RW for all functional mobility at home and demonstrated walker transfer to TTB in tub shower room. Pt dressed EOB sit<stand level using device. Pt had a lot of nerve pain and required increased time, B foot baths with cold water, and also LE elevation. Note that she was also teary. RN in during session to provide pain medicine as well and assess wounds on ankle and buttocks. Discussed with mother how pts pain affects her functionally and how some days she will be able to do more for herself self-care wise than other days. Mother very receptive to education, had hands on practice with providing ADL assist and supervision assist during stands with device. OT demonstrated how to don thigh high Teds using adaptive technique. Pt reports the stockings help with neuropathic pain in feet. She needed A for donning sneakers today, supervision for sit<stand with device to pull up pants. At end of session pt left via PT handoff for further family education.    Therapy Documentation Precautions:  Precautions Precautions: Fall Precaution Comments: monitor 02 sats, HR Restrictions Weight Bearing Restrictions: No Vital Signs: Therapy Vitals Temp: 97.7 F (36.5  C) Pulse Rate: (!) 110 Resp: 19 BP: 137/88 Patient Position (if appropriate): Sitting Oxygen Therapy SpO2: (!) 87 % O2 Device: Room Air ADL: ADL Eating: Set up Grooming: Contact guard Upper Body Bathing: Supervision/safety Lower Body Bathing: Moderate assistance Upper Body Dressing: Minimal assistance Lower Body Dressing: Moderate assistance Toileting: Moderate assistance Toilet Transfer: Moderate assistance      Therapy/Group: Individual Therapy  Kaylene Dawn A Theodore Virgin 03/01/2019, 5:10 PM

## 2019-03-02 ENCOUNTER — Inpatient Hospital Stay (HOSPITAL_COMMUNITY): Payer: BC Managed Care – PPO

## 2019-03-02 ENCOUNTER — Inpatient Hospital Stay (HOSPITAL_COMMUNITY): Payer: BC Managed Care – PPO | Admitting: Occupational Therapy

## 2019-03-02 LAB — CBC
HCT: 40.2 % (ref 36.0–46.0)
Hemoglobin: 12.4 g/dL (ref 12.0–15.0)
MCH: 28 pg (ref 26.0–34.0)
MCHC: 30.8 g/dL (ref 30.0–36.0)
MCV: 90.7 fL (ref 80.0–100.0)
Platelets: 372 10*3/uL (ref 150–400)
RBC: 4.43 MIL/uL (ref 3.87–5.11)
RDW: 14.6 % (ref 11.5–15.5)
WBC: 6.3 10*3/uL (ref 4.0–10.5)
nRBC: 0 % (ref 0.0–0.2)

## 2019-03-02 LAB — URIC ACID: Uric Acid, Serum: 7.2 mg/dL — ABNORMAL HIGH (ref 2.5–7.1)

## 2019-03-02 LAB — BASIC METABOLIC PANEL
Anion gap: 11 (ref 5–15)
BUN: 7 mg/dL (ref 6–20)
CO2: 29 mmol/L (ref 22–32)
Calcium: 9.2 mg/dL (ref 8.9–10.3)
Chloride: 98 mmol/L (ref 98–111)
Creatinine, Ser: 0.69 mg/dL (ref 0.44–1.00)
GFR calc Af Amer: >60 mL/min (ref >60)
GFR calc non Af Amer: >60 mL/min (ref >60)
Glucose, Bld: 145 mg/dL — ABNORMAL HIGH (ref 70–99)
Potassium: 4.2 mmol/L (ref 3.5–5.1)
Sodium: 138 mmol/L (ref 135–145)

## 2019-03-02 LAB — GLUCOSE, CAPILLARY
Glucose-Capillary: 123 mg/dL — ABNORMAL HIGH (ref 70–99)
Glucose-Capillary: 113 mg/dL — ABNORMAL HIGH (ref 70–99)
Glucose-Capillary: 138 mg/dL — ABNORMAL HIGH (ref 70–99)
Glucose-Capillary: 100 mg/dL — ABNORMAL HIGH (ref 70–99)

## 2019-03-02 MED ORDER — DULOXETINE HCL 30 MG PO CPEP
30.0000 mg | ORAL_CAPSULE | Freq: Every day | ORAL | Status: DC
  Administered 2019-03-02 – 2019-03-03 (×2): 30 mg via ORAL
  Filled 2019-03-02 (×2): qty 1

## 2019-03-02 MED ORDER — GABAPENTIN 400 MG PO CAPS
800.0000 mg | ORAL_CAPSULE | Freq: Three times a day (TID) | ORAL | Status: DC
  Administered 2019-03-02 – 2019-03-03 (×4): 800 mg via ORAL
  Filled 2019-03-02 (×4): qty 2

## 2019-03-02 MED ORDER — PREGABALIN 75 MG PO CAPS
75.0000 mg | ORAL_CAPSULE | Freq: Once | ORAL | Status: AC
  Administered 2019-03-02: 75 mg via ORAL
  Filled 2019-03-02: qty 1

## 2019-03-02 MED ORDER — NAPROXEN 250 MG PO TABS
500.0000 mg | ORAL_TABLET | Freq: Two times a day (BID) | ORAL | Status: DC
  Administered 2019-03-02 – 2019-03-03 (×3): 500 mg via ORAL
  Filled 2019-03-02 (×3): qty 2

## 2019-03-02 MED ORDER — HYDROCODONE-ACETAMINOPHEN 5-325 MG PO TABS
1.0000 | ORAL_TABLET | Freq: Four times a day (QID) | ORAL | Status: DC | PRN
  Administered 2019-03-02 – 2019-03-03 (×3): 1 via ORAL
  Filled 2019-03-02 (×4): qty 1

## 2019-03-02 MED ORDER — LORAZEPAM 0.5 MG PO TABS
0.5000 mg | ORAL_TABLET | Freq: Four times a day (QID) | ORAL | Status: DC | PRN
  Administered 2019-03-02: 0.5 mg via ORAL
  Filled 2019-03-02 (×2): qty 1

## 2019-03-02 NOTE — Plan of Care (Signed)
  Problem: Consults Goal: RH GENERAL PATIENT EDUCATION Description: See Patient Education module for education specifics. Outcome: Progressing   Problem: RH BOWEL ELIMINATION Goal: RH STG MANAGE BOWEL WITH ASSISTANCE Description: STG Manage Bowel with Min Assistance. Outcome: Progressing   Problem: RH BLADDER ELIMINATION Goal: RH STG MANAGE BLADDER WITH ASSISTANCE Description: STG Manage Bladder With Min Assistance Outcome: Progressing Goal: RH STG MANAGE BLADDER WITH MEDICATION WITH ASSISTANCE Description: STG Manage Bladder With Medication With [place]. Outcome: Progressing   Problem: RH SKIN INTEGRITY Goal: RH STG SKIN FREE OF INFECTION/BREAKDOWN Description: No new breakdown with min assist  Outcome: Progressing Goal: RH STG ABLE TO PERFORM INCISION/WOUND CARE W/ASSISTANCE Description: STG Able To Perform Incision/Wound Care With [employer]. Outcome: Progressing   Problem: RH PAIN MANAGEMENT Goal: RH STG PAIN MANAGED AT OR BELOW PT'S PAIN GOAL Description: < 3 out of 10.  Outcome: Progressing

## 2019-03-02 NOTE — Progress Notes (Signed)
Pt up sitting in wheel chair. Pt slept in the bed 1-2 hours. Pt continues to rate bilateral foot pain 15/10. Pt requesting stronger pain medications.

## 2019-03-02 NOTE — Progress Notes (Signed)
Physical Therapy Discharge Summary  Patient Details  Name: Anita Hunter MRN: [id number] Date of Birth: 1990/05/02  Today's Date: 03/02/2019 PT Individual Time: 4680-3212 and 2482-5003 PT Individual Time Calculation (min): 30 min and 40 min Missed time: 30 min (pain)    Patient has met 8 of 8 long term goals due to improved activity tolerance, improved balance, increased strength and ability to compensate for deficits.  Patient to discharge at an ambulatory level Modified Independent.   Patient's care partner is independent to provide the necessary  supervision  assistance at discharge. Pt's mother has completed family education, reviewed ambulation, transfers and stair navigation. Pt uses RW for ambulation and uses single rail for stair negotiation, pt's mother can carry the RW up/down steps for the patient.  All goals met.   Recommendation:  Patient will benefit from ongoing skilled PT services in home health setting to continue to advance safe functional mobility, address ongoing impairments in balance,   strength , and minimize fall risk.  Equipment: RW  Reasons for discharge: treatment goals met  Patient/family agrees with progress made and goals achieved: Yes   PT Treatment Interventions: Session 1: Pt seated in w/c upon PT arrival, agreeable to therapy tx and reports pain 10/10 in B feet today. Applied cold wash clothes to her feet at end of session for pain relief. Tried different shoes today for pain relief- grip socks, vs slides, vs tennis shoes- reports all are uncomfortable. Pt performed sit>stand mod I with RW. Pt ambulated to the ortho gym Mod I x 100 ft. Pt performed car transfer with RW and supervision this session. Pt ambulated up/down ramp for unlevel surfaces with RW and supervision. Pt ambulated to the rehab apartment and performed bed transfer mod I, bed mobility independently. Pt ambulated 150 ft back to her room with RW, mod I. Pt left seated in w/c with needs in  reach and chair alarm set.   Session 2: Pt seated in w/c upon PT arrival, agreeable to therapy tx and reports pain 10/10 in B feet today. Pt reports the pain is the worst it has ever been since being in CIR. Continued to apply cold wash clothes/ice for pain management. Pt performed sit>Stand with mod I, reports too painful. Pt reports she is hurting too much to walk to the gym this session, agreeable to perform seated UE strengthening exercises in the w/c. Pt  performed the following exercises for UE strengthening, 2x10 of each with cues for techniques: 4# dowel chest press, 4# dowel shoulder press, 4# dowel bicep curls, 3# dumbbell shoulder abduction, 3# dumbbell shoulder flexion, and shoulder blade squeezes. Discussed doing these exercises at home for HEP, also discussed LE exercises and walking program. Pt reports she is nervous for  d/c bc of foot pain, therapist provided emotional support. Pt missed 35 min of skilled therapy tx secondary to pain.  PT Discharge Precautions/Restrictions Precautions Precautions: Fall Precaution Comments: monitor 02 sats, HR Restrictions Weight Bearing Restrictions: No Pain Pain Assessment Pain Scale: 0-10 Pain Score: 10-Worst pain ever Cognition Overall Cognitive Status: Within Functional Limits for tasks assessed Arousal/Alertness: Awake/alert Orientation Level: Oriented X4 Sensation Sensation Light Touch: Impaired by gross assessment Proprioception: Impaired by gross assessment Additional Comments: B LE neuropothy, burning sensations in B feet. Numbness in half of L hand Coordination Gross Motor Movements are Fluid and Coordinated: No Fine Motor Movements are Fluid and Coordinated: Yes Coordination and Movement Description: generalized weakness and tremors but improved since evaluation Motor  Motor Motor -  Discharge Observations: generalized weakness  Mobility Bed Mobility Bed Mobility: Rolling Right;Rolling Left;Supine to Sit;Sit to  Supine Rolling Right: Independent Rolling Left: Independent Supine to Sit: Independent Sit to Supine: Independent Transfers Transfers: Sit to [employer] Transfers Sit to Stand: Independent with assistive device Stand Pivot Transfers: Independent with assistive device Transfer (Assistive device): Rolling walker Locomotion  Gait Ambulation: Yes Gait Assistance: Independent with assistive device Gait Distance (Feet): 150 Feet Assistive device: Rolling walker Gait Gait: Yes Gait Pattern: Impaired Gait Pattern: Step-through pattern;Poor foot clearance - right;Decreased dorsiflexion - right Gait velocity: decreased Stairs / Additional Locomotion Stairs: Yes Stairs Assistance: Contact Guard/Touching assist Stair Management Technique: One rail Right Number of Stairs: 8 Height of Stairs: 6  Trunk/Postural Assessment  Cervical Assessment Cervical Assessment: Within Functional Limits Thoracic Assessment Thoracic Assessment: Within Functional Limits Lumbar Assessment Lumbar Assessment: Within Functional Limits Postural Control Postural Control: Within Functional Limits  Balance Balance Balance Assessed: Yes Static Sitting Balance Static Sitting - Level of Assistance: 7: Independent Dynamic Sitting Balance Dynamic Sitting - Level of Assistance: 7: Independent Static Standing Balance Static Standing - Balance Support: During functional activity Static Standing - Level of Assistance: 6: Modified independent (Device/Increase time) Dynamic Standing Balance Dynamic Standing - Balance Support: Bilateral upper extremity supported;During functional activity Dynamic Standing - Level of Assistance: 6: Modified independent (Device/Increase time) Extremity Assessment  RLE Assessment RLE Assessment: Exceptions to [facility] Passive Range of Motion (PROM) Comments: DF to neutral Active Range of Motion (AROM) Comments: see strength ankle General Strength Comments: hip flexion 3-/5,  extension grossly 2/5 unable to bridge due to weakness, quads 4/5, hams 3-/5, hip adduction 2/5 abd 2+/5, PF 2+, DF trace LLE Assessment LLE Assessment: Exceptions to [facility] Passive Range of Motion (PROM) Comments: wfl Active Range of Motion (AROM) Comments: wfl General Strength Comments: hip flexion 3-, hip abd/add at least 3/5, extension grossly 2/5 unable to bridge, quads 4+, hams grossly 4/5 tested sitting, df 3-/5    Netta Corrigan, PT, DPT, CSRS 03/02/2019, 1:23 PM

## 2019-03-02 NOTE — Progress Notes (Signed)
Occupational Therapy Discharge Summary  Patient Details  Name: Anita Hunter MRN: [id number] Date of Birth: [birth year]  Today's Date: 03/02/2019 OT Individual Time: 5009-3818 OT Individual Time Calculation (min): 72 min    Patient has met 9 of 9 long term goals due to improved activity tolerance, improved balance and ability to compensate for deficits.  Patient to discharge at overall Supervision level.  Patient's care partner is independent to provide the necessary supervision assistance at discharge.    Reasons goals not met: all goals met  Recommendation:  Patient will benefit from ongoing skilled OT services in home health setting to continue to advance functional skills in the area of BADL and iADL.  Equipment: TTB  Reasons for discharge: treatment goals met  Patient/family agrees with progress made and goals achieved: Yes   OT Intervention: Upon entering the room, pt seated on EOB and awaiting OT arrival. Pt reports, " I haven't felt this much pain in my feet since I have been here." Pt transferred from bed >wheelchair with supervision and propelled wheelchair into room for stand pivot transfer from wheelchair> TTB secondary to pain increasing. Pt bathing while seated on TTB at intermittent supervision level with use of AE as needed to increase independence with tasks. Pt returning to wheelchair at donning LB clothing items with use of reacher and supervision. Increased time needed this session secondary to increased pain. Pt declined donning TED hose this session secondary to pain. Pt remained in wheelchair with call bell and all needed items within reach.    OT Discharge Precautions/Restrictions  Precautions Precautions: Fall General   Vital Signs Therapy Vitals Temp: 98.1 F (36.7 C) Pulse Rate: (!) 108 Resp: 19 BP: 125/77 Patient Position (if appropriate): Sitting Oxygen Therapy SpO2: 97 % O2 Device: Room Air Pain Pain Assessment Pain Scale: 0-10 Pain  Score: 10-Worst pain ever Pain Type: Neuropathic pain Pain Location: Foot Pain Orientation: Right;Left Pain Descriptors / Indicators: Burning Pain Onset: On-going Patients Stated Pain Goal: 3 Pain Intervention(s): Medication (See eMAR);RN made aware;Repositioned;Cold applied Multiple Pain Sites: No ADL ADL Eating: Set up Grooming: Contact guard Upper Body Bathing: Supervision/safety Lower Body Bathing: Moderate assistance Upper Body Dressing: Minimal assistance Lower Body Dressing: Moderate assistance Toileting: Moderate assistance Toilet Transfer: Moderate assistance Vision Baseline Vision/History: No visual deficits Cognition Overall Cognitive Status: Within Functional Limits for tasks assessed Arousal/Alertness: Awake/alert Orientation Level: Oriented X4 Sensation Sensation Additional Comments: B LE neuropothy Coordination Gross Motor Movements are Fluid and Coordinated: No Fine Motor Movements are Fluid and Coordinated: Yes Coordination and Movement Description: generalized weakness and tremors but improved since evaluation Motor  Motor Motor - Discharge Observations: generalized weakness Mobility  Bed Mobility Bed Mobility: Rolling Right;Rolling Left;Supine to Sit;Sit to Supine Rolling Right: Independent Rolling Left: Independent Supine to Sit: Independent Sit to Supine: Independent  Trunk/Postural Assessment  Cervical Assessment Cervical Assessment: Within Functional Limits Thoracic Assessment Thoracic Assessment: Within Functional Limits Lumbar Assessment Lumbar Assessment: Within Functional Limits Postural Control Postural Control: Within Functional Limits  Balance Balance Balance Assessed: Yes Dynamic Sitting Balance Dynamic Sitting - Level of Assistance: 6: Modified independent (Device/Increase time) Static Standing Balance Static Standing - Balance Support: During functional activity Static Standing - Level of Assistance: 5: Stand by  assistance Dynamic Standing Balance Dynamic Standing - Balance Support: Bilateral upper extremity supported;During functional activity Dynamic Standing - Level of Assistance: 5: Stand by assistance Extremity/Trunk Assessment RUE Assessment RUE Assessment: Within Functional Limits LUE Assessment LUE Assessment: Within Functional Limits   Youlanda Tomassetti P  03/02/2019, 8:47 AM

## 2019-03-02 NOTE — Discharge Instructions (Signed)
Inpatient Rehab Discharge Instructions  Anita Hunter Discharge date and time: No discharge date for patient encounter.   Activities/Precautions/ Functional Status: Activity: activity as tolerated Diet: diabetic diet Wound Care: keep wound clean and dry Functional status:  ___ No restrictions     ___ Walk up steps independently ___ 24/7 supervision/assistance   ___ Walk up steps with assistance ___ Intermittent supervision/assistance  ___ Bathe/dress independently ___ Walk with walker     _x__ Bathe/dress with assistance ___ Walk Independently    ___ Shower independently ___ Walk with assistance    ___ Shower with assistance ___ No alcohol     ___ Return to work/school ________    COMMUNITY REFERRALS UPON DISCHARGE:    Home Health:   PT     OT                      Agency:  [facility] @ [phone]    Medical Equipment/Items Ordered:  Gilford Rile, tub bench                                                      Agency/Supplier:  [facility] @ [phone]     Special Instructions: No driving smoking or alcohol   My questions have been answered and I understand these instructions. I will adhere to these goals and the provided educational materials after my discharge from the hospital.  Patient/Caregiver Signature _______________________________ Date __________  Clinician Signature _______________________________________ Date __________  Please bring this form and your medication list with you to all your follow-up doctor's appointments.

## 2019-03-02 NOTE — Progress Notes (Signed)
Pt c/o 15/10 bilateral foot pain. Pain described as burning, tingling, and sensitive to touch. Pt states certain materials that touch them hurt more than others. Pt is requesting stronger medication to help with the pain. Pt sitting up in wheel chair at this time, refusing to lay in the bed, and has ice packs on both feet. Pt educated on the importance on lying in the bed to avoid falling asleep in the wheel chair, and on making sure to notify staff when she needs to transfer. Call light in reach.

## 2019-03-02 NOTE — Progress Notes (Signed)
[place] PHYSICAL MEDICINE & REHABILITATION PROGRESS NOTE   Subjective/Complaints: Patient states she slept poorly last night. Patient complains of foot pain as well as great toe pain bilaterally.  This is worse on the right side.  Denies history of gout.  Started on Lasix for foot swelling.  ROS: + Neuropathic pain with activity, stable.  Denies CP, shortness of breath, nausea, vomiting, diarrhea.  Objective:   No results found. Recent Labs    03/02/19 0545  WBC 6.3  HGB 12.4  HCT 40.2  PLT 372   Recent Labs    03/02/19 0545  NA 138  K 4.2  CL 98  CO2 29  GLUCOSE 145*  BUN 7  CREATININE 0.69  CALCIUM 9.2    Intake/Output Summary (Last 24 hours) at 03/02/2019 0751 Last data filed at 03/01/2019 1900 Gross per 24 hour  Intake 480 ml  Output --  Net 480 ml     Physical Exam: Vital Signs Blood pressure 137/90, pulse (!) 103, temperature 98.1 F (36.7 C), resp. rate 19, weight (!) 147.4 kg, SpO2 97 %. Constitutional: No distress . Vital signs reviewed.  Morbidly obese. HENT: Normocephalic.  Atraumatic. Eyes: EOMI. No discharge. Cardiovascular: No JVD. Respiratory: Normal effort.  No stridor. GI: Non-distended. Skin: Warm and dry.  Intact. Psych: Normal mood.  Normal behavior. Musc: Lower extremity edema, pain with right great toe range of motion minimal erythema. Neurologic: Alert Motor: Bilateral upper extremities: 5/5 proximal distal Right lower extremity: 4 -/5 proximal distal (pain inhibition)  Left lower extremity: Hip flexion, knee extension 4-/5, able dorsiflexion 4-/5 (pain inhibition)  Assessment/Plan: 1. Functional deficits secondary to critical illness polyneuropathy post resp failure  which require 3+ hours per day of interdisciplinary therapy in a comprehensive inpatient rehab setting.  Physiatrist is providing close team supervision and 24 hour management of active medical problems listed below.  Physiatrist and rehab team continue to assess  barriers to discharge/monitor patient progress toward functional and medical goals  Care Tool:  Bathing    Body parts bathed by patient: Right arm, Left arm, Chest, Abdomen, Front perineal area, Buttocks, Right upper leg, Left upper leg, Right lower leg, Left lower leg, Face   Body parts bathed by helper: Buttocks, Right lower leg, Left lower leg     Bathing assist Assist Level: Supervision/Verbal cueing     Upper Body Dressing/Undressing Upper body dressing   What is the patient wearing?: Pull over shirt    Upper body assist Assist Level: Set up assist    Lower Body Dressing/Undressing Lower body dressing      What is the patient wearing?: Pants, Underwear/pull up     Lower body assist Assist for lower body dressing: Supervision/Verbal cueing     Toileting Toileting    Toileting assist Assist for toileting: Contact Guard/Touching assist Assistive Device Comment: walker   Transfers Chair/bed transfer  Transfers assist     Chair/bed transfer assist level: Supervision/Verbal cueing Chair/bed transfer assistive device: Programmer, multimedia   Ambulation assist      Assist level: Supervision/Verbal cueing Assistive device: Walker-rolling Max distance: 150 ft   Walk 10 feet activity   Assist  Walk 10 feet activity did not occur: Safety/medical concerns  Assist level: Supervision/Verbal cueing Assistive device: Walker-rolling   Walk 50 feet activity   Assist Walk 50 feet with 2 turns activity did not occur: Safety/medical concerns  Assist level: Supervision/Verbal cueing Assistive device: Walker-rolling    Walk 150 feet activity   Assist  Walk 150 feet activity did not occur: Safety/medical concerns  Assist level: Supervision/Verbal cueing Assistive device: Walker-rolling    Walk 10 feet on uneven surface  activity   Assist Walk 10 feet on uneven surfaces activity did not occur: Safety/medical concerns          Wheelchair     Assist Will patient use wheelchair at discharge?: No Type of Wheelchair: Manual    Wheelchair assist level: Supervision/Verbal cueing Max wheelchair distance: 100    Wheelchair 50 feet with 2 turns activity    Assist        Assist Level: Supervision/Verbal cueing   Wheelchair 150 feet activity     Assist          Blood pressure 137/90, pulse (!) 103, temperature 98.1 F (36.7 C), resp. rate 19, weight (!) 147.4 kg, SpO2 97 %.    Medical Problem List and Plan: 1.    Critical illness polyneuropathy secondary to acute on chronic respiratory failure with hypoxia related to COVID-19  Continue CIR, plan discharge tomorrow 2.  Antithrombotics: -DVT/anticoagulation: Lovenox.  Doppler studies negative             -antiplatelet therapy: N/A 3. Pain Management/chronic back pain.  Currently with lower extremity pain with some swelling.  Some right great toe first MTP pain, check uric acid start Naprosyn, no problems with renal function             Neuropathic pain is likely related to uncontrolled diabetes with worsening after critical illness, constant, and inhibiting her participation in PT , inhibits sleep, has anxiety component as well   Some relief with gabapentin2489m/d increased to 27083m  Started cymbalta 2030m/11. Increase to 24m22mmorrow  Anxiety related to pain scheduled Ativan QID- will not d/c on ativan  Scheduled hydrocodone TID and qhs-Will plan to d/c onT #3 1wk supply  rather than hydrocodone   4. Mood: Provide emotional support- neuropsych             -antipsychotic agents: N/A 5. Neuropsych: This patient is capable of making decisions on her own behalf. 6. Skin/Wound Care/left ankle wound: Hydrocolloid dressing left ankle daily 7. Fluids/Electrolytes/Nutrition: Routine in and outs with follow-up chemistries 8.  Uncontrolled Diabetes mellitus with hyperglycemia.  Hemoglobin A1c 11.1 12/14.  NovoLog 6 units 3 times daily, Levemir 35  units twice daily.  CBG (last 3)  Recent Labs    03/01/19 1729 03/01/19 2120 03/02/19 0607  GLUCAP 139* 130* 123*   Slightly labile on 1/17 9.  Acute systolic congestive heart failure.  Monitor for any signs of fluid overload, ECHO 12/26 Ejfx 45% Filed Weights   02/28/19 0619 03/01/19 0550 03/02/19 0539  Weight: (!) 148.5 kg (!) 148.6 kg (!) 147.4 kg   Lasix increased to 40 twice daily on 1/17  Stable on 1/16 10.  Morbid obesity.  BMI 47.85.  Dietary follow-up.  Encourage weight loss. 11. Education: educated regarding the importance of using the incentive spirometer hourly.   12.  ID- 27d  post Covid diagnosis was on airborne and contact prec at [facility] coughing , no trach, >21 d post Covid diagnosis, no C diff  D/ced airborne and contact prec, ran this by pulmonary on call who feels this is reasonable  13.  Mild anemia baseline ~13   Hemoglobin 11.3 on 1/9, labs ordered for tomorrow 14.  Tachycardia-likely sinus multifactorial  ECG 12/22 personally reviewed, normal sinus rhythm, repeat ECG reviewed, showing sinus tachycardia  Labetalol increased to  200 twice daily on 1/17 Vitals:   03/02/19 0124 03/02/19 0539  BP:  137/90  Pulse: 95 (!) 103  Resp:  19  Temp:  98.1 F (36.7 C)  SpO2:  97%   LOS: 10 days A FACE TO FACE EVALUATION WAS PERFORMED  Charlett Blake 03/02/2019, 7:51 AM

## 2019-03-03 LAB — GLUCOSE, CAPILLARY: Glucose-Capillary: 158 mg/dL — ABNORMAL HIGH (ref 70–99)

## 2019-03-03 MED ORDER — DULOXETINE HCL 30 MG PO CPEP: 30.0000 mg | ORAL_CAPSULE | Freq: Every day | ORAL | 3 refills | Status: DC

## 2019-03-03 MED ORDER — PREGABALIN 75 MG PO CAPS
150.0000 mg | ORAL_CAPSULE | Freq: Every day | ORAL | Status: DC
  Administered 2019-03-03: 150 mg via ORAL
  Filled 2019-03-03: qty 2

## 2019-03-03 MED ORDER — ACETAMINOPHEN-CODEINE #3 300-30 MG PO TABS: 1.0000 | ORAL_TABLET | ORAL | 0 refills | Status: DC | PRN

## 2019-03-03 MED ORDER — POTASSIUM CHLORIDE CRYS ER 20 MEQ PO TBCR: 40.0000 meq | EXTENDED_RELEASE_TABLET | Freq: Every day | ORAL | 1 refills | Status: DC

## 2019-03-03 MED ORDER — LABETALOL HCL 200 MG PO TABS: 200.0000 mg | ORAL_TABLET | Freq: Two times a day (BID) | ORAL | 1 refills | Status: DC

## 2019-03-03 MED ORDER — NAPROXEN 500 MG PO TABS: 500.0000 mg | ORAL_TABLET | Freq: Two times a day (BID) | ORAL | 0 refills | Status: DC

## 2019-03-03 MED ORDER — FUROSEMIDE 40 MG PO TABS: 40.0000 mg | ORAL_TABLET | Freq: Every day | ORAL | 1 refills | Status: DC

## 2019-03-03 MED ORDER — ALBUTEROL SULFATE HFA 108 (90 BASE) MCG/ACT IN AERS: 2.0000 | INHALATION_SPRAY | Freq: Four times a day (QID) | RESPIRATORY_TRACT | 1 refills | Status: DC | PRN

## 2019-03-03 MED ORDER — INSULIN DETEMIR 100 UNIT/ML FLEXPEN: 35.0000 [IU] | PEN_INJECTOR | Freq: Two times a day (BID) | SUBCUTANEOUS | 11 refills | Status: DC

## 2019-03-03 MED ORDER — CAPSAICIN 0.025 % EX CREA: TOPICAL_CREAM | Freq: Two times a day (BID) | CUTANEOUS | 0 refills | Status: DC

## 2019-03-03 MED ORDER — FAMOTIDINE 20 MG PO TABS: 20.0000 mg | ORAL_TABLET | Freq: Every day | ORAL | 0 refills | Status: DC

## 2019-03-03 MED ORDER — PREGABALIN 75 MG PO CAPS: 75.0000 mg | ORAL_CAPSULE | Freq: Three times a day (TID) | ORAL | 2 refills | Status: DC

## 2019-03-03 MED ORDER — FUROSEMIDE 40 MG PO TABS: 40.0000 mg | ORAL_TABLET | Freq: Every day | ORAL | Status: DC

## 2019-03-03 MED ORDER — NOVOLOG FLEXPEN 100 UNIT/ML ~~LOC~~ SOPN: 6.0000 [IU] | PEN_INJECTOR | Freq: Three times a day (TID) | SUBCUTANEOUS | 11 refills | Status: DC

## 2019-03-03 MED ORDER — IPRATROPIUM-ALBUTEROL 20-100 MCG/ACT IN AERS: 2.0000 | INHALATION_SPRAY | Freq: Four times a day (QID) | RESPIRATORY_TRACT | 0 refills | Status: DC | PRN

## 2019-03-03 MED ORDER — ACETAMINOPHEN 325 MG PO TABS: 650.0000 mg | ORAL_TABLET | Freq: Four times a day (QID) | ORAL | Status: AC | PRN

## 2019-03-03 MED ORDER — PANTOPRAZOLE SODIUM 40 MG PO TBEC: 40.0000 mg | DELAYED_RELEASE_TABLET | Freq: Every day | ORAL | 0 refills | Status: DC

## 2019-03-03 NOTE — Care Management (Signed)
   The overall goal for the admission was met for:   Discharge location: Home with mother and step-father  Length of Stay: 11 days with discharge set for 03/03/19  Discharge activity level: Supervision overall  Home/community participation: Out into community with bariatric wheeled walker  Services provided included: MD, RD, PT, OT, RN, CM, Pharmacy, Neuropsych and SW  Financial Services: Private Insurance: [place]  Follow-up services arranged: Home Health: PT, OT via [facility], DME: Wheeled bari walker and tub transfer bench and Patient/Family has no preference for HH/DME agencies  Comments (or additional information):Home with mother Anita Hunter [phone]; works during the day. Step-father will assist when mother is out of the home/provide transportation if needed.  Patient/Family verbalized understanding of follow-up arrangements: Other : Yes  Individual responsible for coordination of the follow-up plan: Anita Hunter [facility]) [phone]   Confirmed correct DME delivered: Anita Hunter 03/03/2019    Anita Hunter

## 2019-03-03 NOTE — Progress Notes (Signed)
[place] PHYSICAL MEDICINE & REHABILITATION PROGRESS NOTE   Subjective/Complaints: Discussed med management of burning pain of feet.  States she has been a little better since about dinner time yesterday .  Pt received 17m Lyrica before dinner ~5p. TOe pain was improved no significant relief with naproxen.  Some contipation, has been taking hydrocodone 54m Pt does not c/o back pain, reviewed LS MRI 2019, which was essentially  normal  Has knot on Right side near waist   ROS: + Neuropathic pain with activity, stable.  Denies CP, shortness of breath, nausea, vomiting, diarrhea.  Objective:   No results found. Recent Labs    03/02/19 0545  WBC 6.3  HGB 12.4  HCT 40.2  PLT 372   Recent Labs    03/02/19 0545  NA 138  K 4.2  CL 98  CO2 29  GLUCOSE 145*  BUN 7  CREATININE 0.69  CALCIUM 9.2    Intake/Output Summary (Last 24 hours) at 03/03/2019 0748 Last data filed at 03/03/2019 0500 Gross per 24 hour  Intake 1800 ml  Output --  Net 1800 ml     Physical Exam: Vital Signs Blood pressure 104/64, pulse 92, temperature 98.4 F (36.9 C), resp. rate 19, weight (!) 147.4 kg, SpO2 94 %. Constitutional: No distress . Vital signs reviewed.  Morbidly obese. HENT: Normocephalic.  Atraumatic. Eyes: EOMI. No discharge. Cardiovascular: No JVD. Respiratory: Normal effort.  No stridor. GI: Non-distended. Skin: Warm and dry.  Intact.small hematoma and bruising above iliac crest, Right mid axillary line  Psych: Normal mood.  Normal behavior. Musc: Lower extremity edema, pain with right great toe range of motion minimal erythema. Neurologic: Alert Motor: Bilateral upper extremities: 5/5 proximal distal Right lower extremity: 4 -/5 proximal distal (pain inhibition)  Left lower extremity: Hip flexion, knee extension 4-/5, able dorsiflexion 4-/5 (pain inhibition)  Assessment/Plan: 1. Functional deficits secondary to critical illness polyneuropathy post resp failure   Stable for D/C  today F/u PCP in 3-4 weeks F/u PM&R 2 weeks See D/C summary See D/C instructions Care Tool:  Bathing    Body parts bathed by patient: Right arm, Left arm, Chest, Abdomen, Front perineal area, Buttocks, Right upper leg, Left upper leg, Right lower leg, Left lower leg, Face   Body parts bathed by helper: Buttocks, Right lower leg, Left lower leg     Bathing assist Assist Level: Supervision/Verbal cueing     Upper Body Dressing/Undressing Upper body dressing   What is the patient wearing?: Pull over shirt    Upper body assist Assist Level: Supervision/Verbal cueing    Lower Body Dressing/Undressing Lower body dressing      What is the patient wearing?: Pants, Underwear/pull up     Lower body assist Assist for lower body dressing: Supervision/Verbal cueing     Toileting Toileting    Toileting assist Assist for toileting: Supervision/Verbal cueing Assistive Device Comment: walker   Transfers Chair/bed transfer  Transfers assist     Chair/bed transfer assist level: Independent with assistive device Chair/bed transfer assistive device: WaProgrammer, multimedia Ambulation assist      Assist level: Independent with assistive device Assistive device: Walker-rolling Max distance: 150 ft   Walk 10 feet activity   Assist  Walk 10 feet activity did not occur: Safety/medical concerns  Assist level: Independent with assistive device Assistive device: Walker-rolling   Walk 50 feet activity   Assist Walk 50 feet with 2 turns activity did not occur: Safety/medical concerns  Assist  level: Independent with assistive device Assistive device: Walker-rolling    Walk 150 feet activity   Assist Walk 150 feet activity did not occur: Safety/medical concerns  Assist level: Independent with assistive device Assistive device: Walker-rolling    Walk 10 feet on uneven surface  activity   Assist Walk 10 feet on uneven surfaces activity did not occur:  Safety/medical concerns   Assist level: Supervision/Verbal cueing Assistive device: Aeronautical engineer Will patient use wheelchair at discharge?: No Type of Wheelchair: Manual    Wheelchair assist level: Supervision/Verbal cueing Max wheelchair distance: 100    Wheelchair 50 feet with 2 turns activity    Assist        Assist Level: Supervision/Verbal cueing   Wheelchair 150 feet activity     Assist          Blood pressure 104/64, pulse 92, temperature 98.4 F (36.9 C), resp. rate 19, weight (!) 147.4 kg, SpO2 94 %.    Medical Problem List and Plan: 1.    Critical illness polyneuropathy secondary to acute on chronic respiratory failure with hypoxia related to COVID-19  Continue CIR, plan discharge today  2.  Antithrombotics: -DVT/anticoagulation: Lovenox.  Doppler studies negative             -antiplatelet therapy: N/A 3. Pain Management/chronic back pain.  Currently with lower extremity pain with some swelling.  Some right great toe first MTP pain, check uric acid start Naprosyn, no problems with renal function             Neuropathic pain is likely related to uncontrolled diabetes with worsening after critical illness, constant, and inhibiting her participation in PT , inhibits sleep, has anxiety component as well   Some relief with gabapentin2463m/d increased to 27072m- seemed to do better on Lyrica , will substitute Lyrica 15056mor Gabapentin this am, if better pain relief may dischyarge on Lyrica 150m67mD   Started cymbalta 20mg36m1. Increase to 30mg 47m  Anxiety related to pain scheduled Ativan QID- will not d/c on ativan  Scheduled hydrocodone TID and qhs-Will plan to d/c onT #3 1wk supply  rather than hydrocodone   4. Mood: Provide emotional support- neuropsych             -antipsychotic agents: N/A 5. Neuropsych: This patient is capable of making decisions on her own behalf. 6. Skin/Wound Care/left ankle wound: Hydrocolloid  dressing left ankle daily 7. Fluids/Electrolytes/Nutrition: Routine in and outs with follow-up chemistries 8.  Uncontrolled Diabetes mellitus with hyperglycemia.  Hemoglobin A1c 11.1 12/14.  NovoLog 6 units 3 times daily, Levemir 35 units twice daily.  CBG (last 3)  Recent Labs    03/02/19 1705 03/02/19 2104 03/03/19 0624  GLUCAP 138* 100* 158*   Slightly labile on 1/18 9.  Acute systolic congestive heart failure.  Monitor for any signs of fluid overload, ECHO 12/26 Ejfx 45% Filed Weights   02/28/19 0619 03/01/19 0550 03/02/19 0539  Weight: (!) 148.5 kg (!) 148.6 kg (!) 147.4 kg   Lasix increased to 40 twice daily on 1/17- will reduce to daily for d/c  10.  Morbid obesity.  BMI 47.85.  Dietary follow-up.  Encourage weight loss. 11. Education: educated regarding the importance of using the incentive spirometer hourly.   12.  ID- 27d  [facility] Cov[facility], >21 d post Covid diagnosis, no C diff  D/ced airborne and contact  prec, ran this by pulmonary on call who feels this is reasonable  13.  Mild anemia improved Hgb 12.4 14.  Tachycardia-improved   ECG 12/22 personally reviewed, normal sinus rhythm, repeat ECG reviewed, showing sinus tachycardia  Labetalol increased to 200 twice daily on 1/17 Vitals:   03/02/19 2012 03/03/19 0429  BP: 122/68 104/64  Pulse: 84 92  Resp: 19 19  Temp: 97.9 F (36.6 C) 98.4 F (36.9 C)  SpO2: 93% 94%   LOS: 11 days A FACE TO FACE EVALUATION WAS PERFORMED  Charlett Blake 03/03/2019, 7:48 AM

## 2019-03-03 NOTE — Progress Notes (Signed)
Pt has been awake all night. Sitting in wheel chair. Pt has soaked feet in ice water majority of the night, alternating with ice packs. Pt states the pain is worse when laying in the bed.

## 2019-03-05 ENCOUNTER — Telehealth: Payer: Self-pay

## 2019-03-05 NOTE — Telephone Encounter (Signed)
Anita Hunter Insurance risk surveyor [facility] [employer] called requesting a new start date of 03-11-2019, called her back and approved verbal orders.

## 2019-03-11 ENCOUNTER — Telehealth: Payer: Self-pay

## 2019-03-11 NOTE — Telephone Encounter (Signed)
Margaret Insurance risk surveyor for [facility], called stating that the patient changed her mind and is now needing a new start date for home health which will be 03-13-2019.  Called her back and approved new start date

## 2019-03-13 ENCOUNTER — Ambulatory Visit (INDEPENDENT_AMBULATORY_CARE_PROVIDER_SITE_OTHER): Payer: BC Managed Care – PPO | Admitting: Family Medicine

## 2019-03-13 ENCOUNTER — Other Ambulatory Visit: Payer: Self-pay

## 2019-03-13 DIAGNOSIS — Z8616 Personal history of COVID-19: Secondary | ICD-10-CM

## 2019-03-13 DIAGNOSIS — Z7189 Other specified counseling: Secondary | ICD-10-CM | POA: Diagnosis not present

## 2019-03-13 DIAGNOSIS — R5381 Other malaise: Secondary | ICD-10-CM

## 2019-03-13 DIAGNOSIS — E1165 Type 2 diabetes mellitus with hyperglycemia: Secondary | ICD-10-CM

## 2019-03-13 DIAGNOSIS — M792 Neuralgia and neuritis, unspecified: Secondary | ICD-10-CM | POA: Diagnosis not present

## 2019-03-13 DIAGNOSIS — R609 Edema, unspecified: Secondary | ICD-10-CM

## 2019-03-13 MED ORDER — TRESIBA FLEXTOUCH 200 UNIT/ML ~~LOC~~ SOPN: 50.0000 [IU] | PEN_INJECTOR | Freq: Every day | SUBCUTANEOUS | 0 refills | Status: DC

## 2019-03-13 MED ORDER — CONTOUR BLOOD GLUCOSE SYSTEM W/DEVICE KIT: 1.0000 | PACK | Freq: Three times a day (TID) | 0 refills | Status: DC

## 2019-03-13 MED ORDER — INSULIN GLARGINE 100 UNIT/ML ~~LOC~~ SOLN: 50.0000 [IU] | SUBCUTANEOUS | 0 refills | Status: DC

## 2019-03-13 MED ORDER — OXYCODONE HCL 5 MG PO CAPS: 5.0000 mg | ORAL_CAPSULE | ORAL | 0 refills | Status: DC | PRN

## 2019-03-13 MED ORDER — TRESIBA 100 UNIT/ML ~~LOC~~ SOLN: 100.0000 [IU] | Freq: Every day | SUBCUTANEOUS | 0 refills | Status: DC

## 2019-03-13 MED ORDER — GABAPENTIN 600 MG PO TABS: 600.0000 mg | ORAL_TABLET | Freq: Three times a day (TID) | ORAL | 0 refills | Status: DC

## 2019-03-13 MED ORDER — METFORMIN HCL 500 MG PO TABS: 500.0000 mg | ORAL_TABLET | Freq: Two times a day (BID) | ORAL | 0 refills | Status: DC

## 2019-03-13 NOTE — Patient Instructions (Signed)
It was great meeting you today!  I am sorry you had such long hospitalization.  Unfortunately a lot your problems are to be expected when you were intubated for 12 days and were in the hospital for over a month.  That swelling should go down over time.  Please take the gabapentin 600 mg 3 times a day and lieu of the pregabalin.  You can also take oxycodone 5 mg as needed. I will give you a Lantus sample today while you guys figure out what insurance will cover.  Take 50 units of Lantus daily. On top of that we are going to start Metformin 500 g twice daily.  Please follow-up with me next Wednesday as a telemedicine visit.  You can schedule this on your way out.

## 2019-03-16 ENCOUNTER — Telehealth: Payer: Self-pay

## 2019-03-16 ENCOUNTER — Telehealth: Payer: Self-pay | Admitting: *Deleted

## 2019-03-16 DIAGNOSIS — G6281 Critical illness polyneuropathy: Secondary | ICD-10-CM

## 2019-03-16 NOTE — Telephone Encounter (Signed)
Clair Gulling from [facility] calls nurse line to report that patient has increased swelling from feet to lower extremities, up to knee. Pt was educated on using compression socks and elevating extremities. However, patient still complains of pain at 7/10.  Called patient. Pt states that she noticed swelling in feet in hospital, but since being at home she has noticed swelling up to knee. Pt states that she has been elevating feet and is supposed to wear compression socks, however, she has not been due to pain. Offered to schedule patient appointment to be evaluated. Pt states that she will have to call back.   To PCP  Talbot Grumbling, RN

## 2019-03-16 NOTE — Telephone Encounter (Signed)
Verdis Frederickson, PT, [facility] left a message asking for verbal orders for HHPT 1wk3, 2wk6.  Medical record reviewed. Social work note reviewed.  Verbal orders given per office protocol.

## 2019-03-16 NOTE — Telephone Encounter (Addendum)
I contacted Verdis Frederickson, PT, [facility] regarding patients home health.  She states that patient needs a bedside 3 in1 commode. It was not listed on social work discharge note. Order may be faxed to [phone]

## 2019-03-17 ENCOUNTER — Encounter: Payer: Self-pay | Admitting: Family Medicine

## 2019-03-17 DIAGNOSIS — R609 Edema, unspecified: Secondary | ICD-10-CM | POA: Insufficient documentation

## 2019-03-17 DIAGNOSIS — Z7189 Other specified counseling: Secondary | ICD-10-CM | POA: Insufficient documentation

## 2019-03-17 NOTE — Assessment & Plan Note (Signed)
Profound BLE edema likely 2/2 severe, long-term illness. Explained that this will take several months to fully recover from and the edema will follow a similar time course. Having good output from lasix 35m once daily, but could consider increasing. Recommended wrapping legs and frequent elevation. Was clear with family that this is an expected part of her recovery and that it will take time to resolve.

## 2019-03-17 NOTE — Assessment & Plan Note (Signed)
Has home health PT/OT on board. Graduated from in person rehab. Biggest issues now are regaining normal mobility and controlling BLE swelling.

## 2019-03-17 NOTE — Telephone Encounter (Signed)
done

## 2019-03-17 NOTE — Assessment & Plan Note (Signed)
Likely secondary to profound swelling causing stretch on the nerves, her uncontrolled diabetes, and her debility 2/2 her lengthy hospitalization. This will improve over time. Currently taking lasix to help with the swelling, diabetes management as above. Does not feel that her pregabalin is working for her and complains of 10/10 pain. Does not want to try topical liniments given the burning with capsaicin. Will switch to gabapentin, 656m tid. Will likely need to titrate up some. Will give very short supply of oxycodone 539m enough for 3 days to help with the acquity of the pain. Was very clear that this is not a good long-term option and I will not be refilling this medication. - oxycodone 65m83mrn tid for 3 days - gabapenting 600m61md - diabetes management as above - continu lasix 40mg47mly

## 2019-03-17 NOTE — Assessment & Plan Note (Signed)
Hemoglobin a1c 11.1 in hospital. Will start on metformin 537m bid and titrate up. Given that she has no access to insulin will give tresiba sample. 50U daily. The family is to investigate the patient's insurance status and try and get this straightened out. The sample is to help bridge the gap. She might be a candidate for 70/30 if no insurance.

## 2019-03-17 NOTE — Assessment & Plan Note (Signed)
BMI almost 50. This coupled with the diabetes likely contributed to her severe covid infection, even at a young age. Some of this might be fluid retention. Once her more short term sequelae of her hospitalization are under better control this will need to be a significant focus going forward.

## 2019-03-17 NOTE — Progress Notes (Signed)
CHIEF COMPLAINT / HPI: Anita Hunter is a 29 year old female who presents as a hospital follow up. The patient was first admitted on 01/26/2019 2/2 covid-19. She was intubated for 12 days and required a lengthy rehab stint prior to returning home. The patient was treated for a UTI and aspiration pneumonia with a lengthy course of abx. She was discharged on oxygen but was weaned off it during her rehab stint.   While the patient is now medically stable, she has a variety of problems 2/2 to her severity of illness. Her A1C was found to be 11.1 during her hospitalization. She was discharged on lantus and novolog but she apparently has not been able to afford this as he employer dropped her coverage. Her family members have been contacting the HR rep in regards to this.   She is complaining of significant swelling and neuropathy in her feet since discharge. She states that the pain is so severe she has been unable to get any sleep since discharge and she essentially moans all night per her family. She has been taking pregabalin which she attributes her feet swelling to and states it has not been helping. She has tried capsaicin cream which she feels made it worse due to the burning.  She is now able to walk, usually with the assistance of a walker although this is not required. She feels that ambulation has been more difficult because of the feet swelling. She is taking lasix 39m daily and has been urinating well but states that she does not feel the swelling going down. Her family report that they can see the swelling accumulate "instantly" at times.   OBJECTIVE: BP 140/80   Pulse (!) 111   Wt (!) 337 lb 12.8 oz (153.2 kg)   SpO2 100%   BMI 49.88 kg/m   Gen: 29year old AA female, in some mild discomfort, in some pain. Her pain seems to wax and wane during the interview with it getting the worst with attention being drawn to it. CV: tachycardic, no m/r/g Resp: lungs clear to auscultation bilaterally,  no accessory muscle use Abd: SNTND, BS present, no guarding or organomegaly Neuro: Alert and oriented, Speech clear, No gross deficits  Bilateral feet: notable swelling up to mid-shin. 3+ pitting edema in bilateral leg, mild sheen to the skin indicating taughtness.   ASSESSMENT / PLAN:  Uncontrolled type 2 diabetes mellitus with hyperglycemia (HCC) Hemoglobin a1c 11.1 in hospital. Will start on metformin 5035mbid and titrate up. Given that she has no access to insulin will give tresiba sample. 50U daily. The family is to investigate the patient's insurance status and try and get this straightened out. The sample is to help bridge the gap. She might be a candidate for 70/30 if no insurance.  Morbid obesity (HCC) BMI almost 50. This coupled with the diabetes likely contributed to her severe covid infection, even at a young age. Some of this might be fluid retention. Once her more short term sequelae of her hospitalization are under better control this will need to be a significant focus going forward.  Neuropathic pain Likely secondary to profound swelling causing stretch on the nerves, her uncontrolled diabetes, and her debility 2/2 her lengthy hospitalization. This will improve over time. Currently taking lasix to help with the swelling, diabetes management as above. Does not feel that her pregabalin is working for her and complains of 10/10 pain. Does not want to try topical liniments given the burning with capsaicin. Will  switch to gabapentin, 621m tid. Will likely need to titrate up some. Will give very short supply of oxycodone 519m enough for 3 days to help with the acquity of the pain. Was very clear that this is not a good long-term option and I will not be refilling this medication. - oxycodone 62m76mrn tid for 3 days - gabapenting 600m89md - diabetes management as above - continu lasix 40mg56mly  Complex care coordination Patient with huge complexity to her care to help her rehab  and get back to baseline after such a prolonged hospital course. Would like to schedule numerous, frequent visits to assist with this. Could involve chronic care coordination at some point in the near future if family unable to establish insurance, unable to continue with home health for whatever reason, or if other issues arise.  Debility Has home health PT/OT on board. Graduated from in person rehab. Biggest issues now are regaining normal mobility and controlling BLE swelling.  Edema Profound BLE edema likely 2/2 severe, long-term illness. Explained that this will take several months to fully recover from and the edema will follow a similar time course. Having good output from lasix 40mg 42m daily, but could consider increasing. Recommended wrapping legs and frequent elevation. Was clear with family that this is an expected part of her recovery and that it will take time to resolve.   Made very clear to family that I would like to see her as follow up in some capacity in person vs virtual on 2/3 for continue follow up  Lawerence Dery Guadalupe DawnY-3 Family Medicine Resident [facility]

## 2019-03-17 NOTE — Telephone Encounter (Signed)
OK to order 3:1 Bedside commode

## 2019-03-17 NOTE — Assessment & Plan Note (Signed)
Patient with huge complexity to her care to help her rehab and get back to baseline after such a prolonged hospital course. Would like to schedule numerous, frequent visits to assist with this. Could involve chronic care coordination at some point in the near future if family unable to establish insurance, unable to continue with home health for whatever reason, or if other issues arise.

## 2019-03-19 ENCOUNTER — Telehealth: Payer: Self-pay | Admitting: Family Medicine

## 2019-03-19 NOTE — Telephone Encounter (Signed)
Pt is calling and would like to speak with Dr. Kris Mouton. She has scheduled a follow up appointment for next week 02/10 but wanted to update him on the swelling in her legs before the appointment.   Please call pt to discuss.

## 2019-03-23 NOTE — Telephone Encounter (Signed)
Pt is calling back to see if Dr. Kris Mouton could please call her. She would like to speak with him before her appointment.   The best call back number is [phone].

## 2019-03-25 ENCOUNTER — Ambulatory Visit (INDEPENDENT_AMBULATORY_CARE_PROVIDER_SITE_OTHER): Payer: BC Managed Care – PPO | Admitting: Family Medicine

## 2019-03-25 ENCOUNTER — Encounter: Payer: Self-pay | Admitting: Family Medicine

## 2019-03-25 ENCOUNTER — Other Ambulatory Visit: Payer: Self-pay

## 2019-03-25 VITALS — BP 134/84 | HR 115 | Wt 325.6 lb

## 2019-03-25 DIAGNOSIS — R5383 Other fatigue: Secondary | ICD-10-CM | POA: Diagnosis not present

## 2019-03-25 DIAGNOSIS — R251 Tremor, unspecified: Secondary | ICD-10-CM

## 2019-03-25 DIAGNOSIS — L89521 Pressure ulcer of left ankle, stage 1: Secondary | ICD-10-CM

## 2019-03-25 DIAGNOSIS — M7989 Other specified soft tissue disorders: Secondary | ICD-10-CM

## 2019-03-25 DIAGNOSIS — E1165 Type 2 diabetes mellitus with hyperglycemia: Secondary | ICD-10-CM

## 2019-03-25 DIAGNOSIS — G6281 Critical illness polyneuropathy: Secondary | ICD-10-CM

## 2019-03-25 DIAGNOSIS — R609 Edema, unspecified: Secondary | ICD-10-CM

## 2019-03-25 MED ORDER — FUROSEMIDE 40 MG PO TABS: 40.0000 mg | ORAL_TABLET | Freq: Two times a day (BID) | ORAL | 1 refills | Status: DC

## 2019-03-25 MED ORDER — CYCLOBENZAPRINE HCL 10 MG PO TABS: 10.0000 mg | ORAL_TABLET | Freq: Three times a day (TID) | ORAL | 0 refills | Status: DC | PRN

## 2019-03-25 MED ORDER — TRESIBA FLEXTOUCH 200 UNIT/ML ~~LOC~~ SOPN: 50.0000 [IU] | PEN_INJECTOR | Freq: Every day | SUBCUTANEOUS | 0 refills | Status: DC

## 2019-03-25 MED ORDER — OXYCODONE HCL 10 MG PO TABS: 10.0000 mg | ORAL_TABLET | Freq: Three times a day (TID) | ORAL | 0 refills | Status: AC | PRN

## 2019-03-25 NOTE — Telephone Encounter (Signed)
Discussed plan of care at today's visit  [name] MD PGY-3 Family Medicine Resident

## 2019-03-25 NOTE — Patient Instructions (Addendum)
It was great seeing you again today!  I am sorry that we have not gotten a lot of relief for you.  We are going to double up on the Lasix and take that twice a day to help with swelling.  Continue elevating and compressing your legs.  I will make a neurology referral to [facility] after you guys leave.  You should hear back from them in a couple of weeks.  We are going get some blood work to check your liver, kidney, blood counts, and diabetes test.  I sent in the muscle relaxer and the refill of the pain medication.  I will give you call with results on probably Friday.

## 2019-03-26 LAB — CBC WITH DIFFERENTIAL/PLATELET
Basophils Absolute: 0 10*3/uL (ref 0.0–0.2)
Basos: 0 %
EOS (ABSOLUTE): 0.2 10*3/uL (ref 0.0–0.4)
Eos: 2 %
Hematocrit: 36.8 % (ref 34.0–46.6)
Hemoglobin: 11.9 g/dL (ref 11.1–15.9)
Immature Grans (Abs): 0 10*3/uL (ref 0.0–0.1)
Immature Granulocytes: 0 %
Lymphocytes Absolute: 1.6 10*3/uL (ref 0.7–3.1)
Lymphs: 18 %
MCH: 28.1 pg (ref 26.6–33.0)
MCHC: 32.3 g/dL (ref 31.5–35.7)
MCV: 87 fL (ref 79–97)
Monocytes Absolute: 0.6 10*3/uL (ref 0.1–0.9)
Monocytes: 7 %
Neutrophils Absolute: 6.3 10*3/uL (ref 1.4–7.0)
Neutrophils: 73 %
Platelets: 391 10*3/uL (ref 150–450)
RBC: 4.23 x10E6/uL (ref 3.77–5.28)
RDW: 13.5 % (ref 11.7–15.4)
WBC: 8.7 10*3/uL (ref 3.4–10.8)

## 2019-03-26 LAB — COMPREHENSIVE METABOLIC PANEL
ALT: 47 IU/L — ABNORMAL HIGH (ref 0–32)
AST: 52 IU/L — ABNORMAL HIGH (ref 0–40)
Albumin/Globulin Ratio: 1.1 — ABNORMAL LOW (ref 1.2–2.2)
Albumin: 3.9 g/dL (ref 3.9–5.0)
Alkaline Phosphatase: 81 IU/L (ref 39–117)
BUN/Creatinine Ratio: 5 — ABNORMAL LOW (ref 9–23)
BUN: 3 mg/dL — ABNORMAL LOW (ref 6–20)
Bilirubin Total: 0.6 mg/dL (ref 0.0–1.2)
CO2: 22 mmol/L (ref 20–29)
Calcium: 9.5 mg/dL (ref 8.7–10.2)
Chloride: 97 mmol/L (ref 96–106)
Creatinine, Ser: 0.63 mg/dL (ref 0.57–1.00)
GFR calc Af Amer: 142 mL/min/{1.73_m2} (ref >59)
GFR calc non Af Amer: 123 mL/min/{1.73_m2} (ref >59)
Globulin, Total: 3.4 g/dL (ref 1.5–4.5)
Glucose: 117 mg/dL — ABNORMAL HIGH (ref 65–99)
Potassium: 3.9 mmol/L (ref 3.5–5.2)
Sodium: 140 mmol/L (ref 134–144)
Total Protein: 7.3 g/dL (ref 6.0–8.5)

## 2019-03-26 LAB — HEMOGLOBIN A1C
Est. average glucose Bld gHb Est-mCnc: 171 mg/dL
Hgb A1c MFr Bld: 7.6 % — ABNORMAL HIGH (ref 4.8–5.6)

## 2019-03-27 ENCOUNTER — Encounter: Payer: Self-pay | Admitting: Family Medicine

## 2019-03-27 NOTE — Assessment & Plan Note (Signed)
Has not gotten much relief from the gabapentin. States that the very small amount of oxycodone has helped some. Discussed increasing the dose of the gabapentin but patient opted to have muscle relaxer added on. Will start flexeril 53m tid. Continue gabapentin 600tid, cymbalta 328mdaily. Based on their request will refer to neurology although I was clear that I am not sure they will have much more to add.

## 2019-03-27 NOTE — Assessment & Plan Note (Signed)
Profound BLE. Likely 2/2 critical illness. Last echo with EF 40-45% which might be contributing somewhat. Will increase lasix to 34m bid. Patient has been drinking a ton of water, recommended decreasing this to try and get below 2L per day. Continue elevation, wrapping. Follow up in 2 weeks for repeat bmp.

## 2019-03-27 NOTE — Assessment & Plan Note (Signed)
Has been out of tresiba sample for around a week, feels that her sugars have been elevated. Still getting insurance straightened out. Will need to likely transition to insulin 70/30 if cannot get insurance established. Dispensed 2 more tresiba sample to last for 2 weeks. Tyler Aas 50U daily, metformin 567m BID. A1c Markedly improved today at 7.6 from >11. - tresiba 50U daily - metformin 5079mbid - a1c in 3 months

## 2019-03-27 NOTE — Progress Notes (Signed)
   CHIEF COMPLAINT / HPI: 29 year old very medically complex patient, well known to me. In short, intubated for 12 days and spent almost a month in the hospital due to covid-19. Major issues are extreme BLE swelling, diabetes, and debility. Please see my note from 03/13/2019 for more detail.  She feels that her swelling is still "out of control". She feels that the only thing that helps are swelling is resting her feet in a cold ice water bath. She feels that elevation and compression have note been helping her swelling. She has neuropathic pain which is described as a burning sensation all of the time in her feet. This has not improved with the gabapentin. She also complains about having subjective "feet shaking" at night. Her pain is only controlled by the oxycodone. She would like to try a muscle relaxer for the feet shaking and "locking up". Lastly they would like a referral to a neurologist for evaluation of her feet shaking. She prefers a Garment/textile technologist at [facility].  They have not been able to get her insurance issues straightened out yet. Has been out of tresiba for 1 week. Has not been checking sugars as they were just able to get the supplies a few days ago. She has not been happy with PT/OT. They have been coming inconsistently per her report. Have seen her only 1-2 each since leaving the hospital.  PERTINENT  PMH / PSH:    OBJECTIVE: BP 134/84   Pulse (!) 115   Wt (!) 325 lb 9.6 oz (147.7 kg)   SpO2 99%   BMI 48.08 kg/m   Gen: 29 year old AA female, no acute distress, comfortable CV: tachycardic, regular rhythm, skin warm and dry. 4+ pitting edema BLE. Resp: lungs clear to auscultation bilaterally, no accessory muscle use Neuro: Alert and oriented, Speech clear, No gross deficits  BLE: Marked swelling BLE. Small ulceration noted on lateral aspect of left lower leg.Stage 1 ulcer. Sensation is intact all distributions.  ASSESSMENT / PLAN:  Critical illness polyneuropathy ([place]) Has not  gotten much relief from the gabapentin. States that the very small amount of oxycodone has helped some. Discussed increasing the dose of the gabapentin but patient opted to have muscle relaxer added on. Will start flexeril 90m tid. Continue gabapentin 600tid, cymbalta 39mdaily. Based on their request will refer to neurology although I was clear that I am not sure they will have much more to add.  Uncontrolled type 2 diabetes mellitus with hyperglycemia ([place] for around a week, feels that her sugars have been elevated. Still getting insurance straightened out. Will need to likely transition to insulin 70/30 if cannot get insurance established. Dispensed 2 more tresiba sample to last for 2 weeks. TrTyler Aas0U daily, metformin 50058mID. A1c Markedly improved today at 7.6 from >11. - tresiba 50U daily - metformin 500m78md - a1c in 3 months  Edema Profound BLE. Likely 2/2 critical illness. Last echo with EF 40-45% which might be contributing somewhat. Will increase lasix to 40mg42m. Patient has been drinking a ton of water, recommended decreasing this to try and get below 2L per day. Continue elevation, wrapping. Follow up in 2 weeks for repeat bmp.  Pressure injury of skin Stage one pressure ulcer on left lateral lower leg. Will continue to monitor, wound care as previously doing.     JacobGuadalupe DawnGY-3 Family Medicine Resident [facility]

## 2019-03-27 NOTE — Assessment & Plan Note (Signed)
Stage one pressure ulcer on left lateral lower leg. Will continue to monitor, wound care as previously doing.

## 2019-04-01 ENCOUNTER — Telehealth: Payer: Self-pay

## 2019-04-01 NOTE — Telephone Encounter (Signed)
Patient calls nurse line requesting refill of oxycodone 35m tabs. Patient states that she is still experiencing a lot of pain and that the rx was only written for 5 days. Medication is no longer on medication list. Patient seen in office on 03/25/19 for polyneuropathy.   Please advise  To PCP  HTalbot Grumbling RN

## 2019-04-06 ENCOUNTER — Other Ambulatory Visit: Payer: Self-pay | Admitting: *Deleted

## 2019-04-06 ENCOUNTER — Other Ambulatory Visit: Payer: Self-pay | Admitting: Family Medicine

## 2019-04-06 MED ORDER — METFORMIN HCL 500 MG PO TABS: 500.0000 mg | ORAL_TABLET | Freq: Two times a day (BID) | ORAL | 0 refills | Status: DC

## 2019-04-07 DIAGNOSIS — B9620 Unspecified Escherichia coli [E. coli] as the cause of diseases classified elsewhere: Secondary | ICD-10-CM

## 2019-04-07 DIAGNOSIS — Z9181 History of falling: Secondary | ICD-10-CM

## 2019-04-07 DIAGNOSIS — M545 Low back pain: Secondary | ICD-10-CM

## 2019-04-07 DIAGNOSIS — E1142 Type 2 diabetes mellitus with diabetic polyneuropathy: Secondary | ICD-10-CM

## 2019-04-07 DIAGNOSIS — Z7984 Long term (current) use of oral hypoglycemic drugs: Secondary | ICD-10-CM

## 2019-04-07 DIAGNOSIS — G4733 Obstructive sleep apnea (adult) (pediatric): Secondary | ICD-10-CM

## 2019-04-07 DIAGNOSIS — G8929 Other chronic pain: Secondary | ICD-10-CM

## 2019-04-07 DIAGNOSIS — U071 COVID-19: Secondary | ICD-10-CM

## 2019-04-07 DIAGNOSIS — J1282 Pneumonia due to coronavirus disease 2019: Secondary | ICD-10-CM | POA: Diagnosis not present

## 2019-04-07 DIAGNOSIS — D649 Anemia, unspecified: Secondary | ICD-10-CM

## 2019-04-07 DIAGNOSIS — I5021 Acute systolic (congestive) heart failure: Secondary | ICD-10-CM

## 2019-04-07 DIAGNOSIS — J80 Acute respiratory distress syndrome: Secondary | ICD-10-CM

## 2019-04-07 DIAGNOSIS — N390 Urinary tract infection, site not specified: Secondary | ICD-10-CM

## 2019-04-07 DIAGNOSIS — J9621 Acute and chronic respiratory failure with hypoxia: Secondary | ICD-10-CM

## 2019-04-07 DIAGNOSIS — G7281 Critical illness myopathy: Secondary | ICD-10-CM

## 2019-04-07 DIAGNOSIS — K7581 Nonalcoholic steatohepatitis (NASH): Secondary | ICD-10-CM

## 2019-04-07 DIAGNOSIS — Z6841 Body Mass Index (BMI) 40.0 and over, adult: Secondary | ICD-10-CM

## 2019-04-07 DIAGNOSIS — J9622 Acute and chronic respiratory failure with hypercapnia: Secondary | ICD-10-CM

## 2019-04-09 ENCOUNTER — Telehealth: Payer: Self-pay

## 2019-04-09 ENCOUNTER — Other Ambulatory Visit: Payer: Self-pay | Admitting: Family Medicine

## 2019-04-09 MED ORDER — GABAPENTIN 600 MG PO TABS: 900.0000 mg | ORAL_TABLET | Freq: Three times a day (TID) | ORAL | 0 refills | Status: DC

## 2019-04-09 NOTE — Telephone Encounter (Signed)
Agree with the above. Please see other telephone encounter for more information.  [name] MD PGY-3 Family Medicine Resident

## 2019-04-09 NOTE — Telephone Encounter (Signed)
Called patient and she states that she is constantly having spasms really bad in her feet..  They curl up on their own.  Patient was not aware that she had an appointment on 04/08/19.  I looked and did not see it to verify for her. Patient states that she keeps all of her appointments.  Patient says she needs Voltaren gel and flexeril until her appointment.  Anita Hunter, [place]

## 2019-04-09 NOTE — Telephone Encounter (Signed)
Pt is calling wanting to know what to do about pain medicine until her appt on 04/13/2019. Please call pt and advise. Thanks

## 2019-04-09 NOTE — Telephone Encounter (Signed)
PT from [facility] calls nurse line regarding patient's reports of increased lower extremity pain. Patient is currently on gabapentin and tylenol regimen for pain. However, patient reports that she is receiving little relief from pain.   Vitals are as follows: - BP: 140/90 -HR 96 -SpO2: 94  Patient has appointment with PCP on Monday, 04/13/2019.   Text paged PCP regarding unmanaged pain.   Provider responded. Recommends that patient can increase gabapentin to 954m tid with tylenol. Also recommends that patient can try OTC voltaren gel for pain.   Called patient to follow up and advise of above. Patient did not answer, voicemail left for patient to return phone call.   To PCP   HTalbot Grumbling RN

## 2019-04-09 NOTE — Telephone Encounter (Signed)
She can increase the gabapentin to 983m tid. Can increase current prescription to 1.5 tablets. Continue taking muscle relaxer. Can also try voltaren gel on feet. I was clear at the last appointment I will not be prescribing any more opiates given the addictive potential. Given her extensive swelling and recovery from prolonged hospitalization, some pain and neuropathy is expected. I did notice that she fell off of my clinic schedule on 2/24 on the day prior so would encourage her to keep appointments so that I can better assess her progress and adjust medications in person as necessary. I do understand that things pop up and that may not always be possible but it helps me better care for her.  JGuadalupe DawnMD PGY-3 Family Medicine Resident

## 2019-04-09 NOTE — Telephone Encounter (Signed)
Flexeril refilled and sent to the pharmacy on 2/23 for a 30 day supply. She should check with her pharmacy to see if it is there. Voltaren gel is over the counter, she can pick it up when she gets her flexeril.  [name] MD PGY-3 Family Medicine Resident

## 2019-04-10 NOTE — Telephone Encounter (Signed)
Called patient and informed her of Flexeril called into pharmacy and that Voltaren gel is over the counter for purchase.  Patient was appreciative.    Patient's mother Mardene Celeste got on phone and asked " what am I going to do about her daughter's pain in her feet?"    I informed Mardene Celeste that I was not the doctor and that I would pass on the question to patient's PCP.  Ozella Almond, [place]

## 2019-04-11 ENCOUNTER — Other Ambulatory Visit: Payer: Self-pay | Admitting: Family Medicine

## 2019-04-13 ENCOUNTER — Other Ambulatory Visit: Payer: Self-pay

## 2019-04-13 ENCOUNTER — Telehealth: Payer: Self-pay

## 2019-04-13 ENCOUNTER — Encounter: Payer: Self-pay | Admitting: Family Medicine

## 2019-04-13 ENCOUNTER — Ambulatory Visit (INDEPENDENT_AMBULATORY_CARE_PROVIDER_SITE_OTHER): Payer: BC Managed Care – PPO | Admitting: Family Medicine

## 2019-04-13 VITALS — BP 128/80 | HR 112 | Wt 302.2 lb

## 2019-04-13 DIAGNOSIS — I5021 Acute systolic (congestive) heart failure: Secondary | ICD-10-CM

## 2019-04-13 DIAGNOSIS — L89521 Pressure ulcer of left ankle, stage 1: Secondary | ICD-10-CM

## 2019-04-13 DIAGNOSIS — R609 Edema, unspecified: Secondary | ICD-10-CM | POA: Diagnosis not present

## 2019-04-13 DIAGNOSIS — M792 Neuralgia and neuritis, unspecified: Secondary | ICD-10-CM

## 2019-04-13 DIAGNOSIS — E1165 Type 2 diabetes mellitus with hyperglycemia: Secondary | ICD-10-CM | POA: Diagnosis not present

## 2019-04-13 DIAGNOSIS — G6281 Critical illness polyneuropathy: Secondary | ICD-10-CM | POA: Diagnosis not present

## 2019-04-13 DIAGNOSIS — R5381 Other malaise: Secondary | ICD-10-CM

## 2019-04-13 MED ORDER — ACETAMINOPHEN-CODEINE #3 300-30 MG PO TABS: 1.0000 | ORAL_TABLET | ORAL | 0 refills | Status: DC | PRN

## 2019-04-13 MED ORDER — TRESIBA FLEXTOUCH 200 UNIT/ML ~~LOC~~ SOPN: 40.0000 [IU] | PEN_INJECTOR | Freq: Every day | SUBCUTANEOUS | 6 refills | Status: DC

## 2019-04-13 NOTE — Telephone Encounter (Addendum)
Attempted to call patient to give information on appointments.  There was no answer.  LVM to call office.  Patient has upcoming appointments at:   [facility] on 04/20/2019 DVT at 1400 and 1500 for Echocardiogram [phone] if she needs to reschedule.  If patient calls back please give her the above information on her appointments.  Ozella Almond, [place]

## 2019-04-13 NOTE — Progress Notes (Signed)
CHIEF COMPLAINT / HPI: 29 year old female who presents for follow-up.  In short patient was hospitalized from 01/26/19 till 02/20/19.  She was intubated for approximately 2 weeks secondary to COVID-19 infection.   Her residual chronic issues are mainly due to leg swelling, severe neuropathic bilateral foot pain, and loss of sensation to nerve distributions in both feet.  She is also been noted to have a stage I ulcer around her left lateral malleolus. Of note the patient was found to have an A1c of 11.1 while in the hospital, she has been getting [place] samples and Metformin meantime.  For her swelling the patient had her Lasix dosing doubled to 40 mg twice daily oral.  She feels that this has helped some with her swelling.  She has been urinating very frequently over the course of the last 3 weeks.  For her pain her gabapentin was recently increased to 900 3 times daily, along with 10 mg 3 times daily Flexeril.  She has been getting oxycodone 5 mg, 3 supply at last 2 visits.  She states this does help quite a bit of her pain.  She states that the burning neuropathic pain is there all the time unless she places her feet in ice water.  She endorses numbness of the anterior portion of her forefoot, and lateral foot on the left side.  Endorses numbness in the medial side of the foot on the right side.  Of note and neurology referral was placed last visit.  She does have an appointment scheduled for 04/29/2019  OBJECTIVE: BP 128/80   Pulse (!) 112   Wt (!) 302 lb 3.2 oz (137.1 kg)   SpO2 100%   BMI 44.63 kg/m   Gen: 29 year old African-American female, no acute distress, comfortable CV: Mild tachycardia, regular rhythm, no M/R/G.  LE 3+ pitting edema bilateral lower extremity up to mid thigh.  Formerly been up to past the hip level Resp: Lungs clear to auscultation bilaterally, no accessory muscle use Neuro: Alert and oriented, Speech clear, No gross deficits  Bilateral feet: Stage I pressure  ulcer noted left lateral foot, inferior to lateral malleolus.  Picture attached.  Has decreased in both size, depth since last visit.  No erythema, no swelling noted.      ASSESSMENT / PLAN:  Acute systolic CHF (congestive heart failure) (HCC) Noted to have systolic heart failure in the hospital.  Given her persistent bilateral lower extremity swelling we will get a repeat echo to see how this is changed over the last few months.  We will continue Lasix 40 mg twice daily.  If has true systolic heart failure likely to be started on beta-blocker, ACE.  Critical illness polyneuropathy ([place]) Unclear etiology although the extreme persistent lower extremity swelling could potentially be causing a compression neuropathy.  Fortunately has neurology consultation coming up soon, will have them weigh in on issue as well. -Continue gabapentin 900 mg 3 times daily -Flexeril 10 mg 3 times daily -Heating pad or ice as needed -Gave supply of Tylenol 3 today.  Type 2 diabetes mellitus (HCC) Has achieved good control on Tresiba 50 units daily, Metformin 500 mg twice daily.  A1c 7.7 which is dramatic improvement from around 11 in the hospital.  Will dial back Tresiba to 40 units daily, continue Metformin 500 mg twice daily.  Patient to let me know if she starts having any lows and we can back off on the [place] more.  Debility Patient is making great strides with her mobility  will somewhat limited by her profound lower extremity swelling and neuropathic pain.  He is continue work with PT and OT as outpatient.  Neuropathic pain This remains the patient's biggest complaint biggest issue.  Has been limiting her somewhat in PT.  Has been fairly well-controlled with short courses of oxycodone, but I will not be refilling this medication given the addictive potential.  Will compromise and give short supply of Tylenol 3.  Pressure injury of skin Stage I pressure ulcer to left lateral malleolus.  States he improved  significantly from last visit.  Continue wound care as they have been doing, will recheck at next visit in 2 to 3 weeks.  Discussed alarm signs such as increased swelling, erythema, purulent discharge.  Edema Has been improving with 40 mg twice daily Lasix.  Will get bilateral lower extremity ultrasound and echo to evaluate for other causes.  Had normal kidney and liver function at last visit.     [name] MD PGY-3 Family Medicine Resident [place]

## 2019-04-13 NOTE — Patient Instructions (Signed)
It was great seeing you again today!  I am glad you are making some slow progress.  We will get an ultrasound of your legs and your heart to investigate why your swelling is not going away little faster.  I am glad you have responded well to the diuretics, we will get a kidney test again today to see how this is doing.  Glad that your insurance has been straightened out.  I sent in a prescription for Tresiba.  Please let me know if this is too expensive I can switch her to another similar medication on [facility] BS formulary.  You will do 40 units a day now.  I sent in some Tylenol 3.  I also sent in a referral to a pain management specialist to help with the pain.  I will give you call with these results, keep up all the good work and hang in there.

## 2019-04-14 ENCOUNTER — Encounter: Payer: Self-pay | Admitting: Family Medicine

## 2019-04-14 LAB — BASIC METABOLIC PANEL
BUN/Creatinine Ratio: 7 — ABNORMAL LOW (ref 9–23)
BUN: 4 mg/dL — ABNORMAL LOW (ref 6–20)
CO2: 22 mmol/L (ref 20–29)
Calcium: 9.7 mg/dL (ref 8.7–10.2)
Chloride: 98 mmol/L (ref 96–106)
Creatinine, Ser: 0.56 mg/dL — ABNORMAL LOW (ref 0.57–1.00)
GFR calc Af Amer: 148 mL/min/{1.73_m2} (ref >59)
GFR calc non Af Amer: 128 mL/min/{1.73_m2} (ref >59)
Glucose: 112 mg/dL — ABNORMAL HIGH (ref 65–99)
Potassium: 3.8 mmol/L (ref 3.5–5.2)
Sodium: 138 mmol/L (ref 134–144)

## 2019-04-14 NOTE — Assessment & Plan Note (Signed)
Unclear etiology although the extreme persistent lower extremity swelling could potentially be causing a compression neuropathy.  Fortunately has neurology consultation coming up soon, will have them weigh in on issue as well. -Continue gabapentin 900 mg 3 times daily -Flexeril 10 mg 3 times daily -Heating pad or ice as needed -Gave supply of Tylenol 3 today.

## 2019-04-14 NOTE — Assessment & Plan Note (Signed)
Patient is making great strides with her mobility will somewhat limited by her profound lower extremity swelling and neuropathic pain.  He is continue work with PT and OT as outpatient.

## 2019-04-14 NOTE — Assessment & Plan Note (Signed)
Has achieved good control on Tresiba 50 units daily, Metformin 500 mg twice daily.  A1c 7.7 which is dramatic improvement from around 11 in the hospital.  Will dial back Tresiba to 40 units daily, continue Metformin 500 mg twice daily.  Patient to let me know if she starts having any lows and we can back off on the [place] more.

## 2019-04-14 NOTE — Assessment & Plan Note (Signed)
This remains the patient's biggest complaint biggest issue.  Has been limiting her somewhat in PT.  Has been fairly well-controlled with short courses of oxycodone, but I will not be refilling this medication given the addictive potential.  Will compromise and give short supply of Tylenol 3.

## 2019-04-14 NOTE — Assessment & Plan Note (Deleted)
BP well controlled at 128/80.  Taking labetalol 400 mg twice daily, furosemide 40 mg twice daily.  Could consider starting lisinopril if proven to have systolic heart failure, but unclear if she needs any hypertensive treatment at all at this point.

## 2019-04-14 NOTE — Assessment & Plan Note (Signed)
Noted to have systolic heart failure in the hospital.  Given her persistent bilateral lower extremity swelling we will get a repeat echo to see how this is changed over the last few months.  We will continue Lasix 40 mg twice daily.  If has true systolic heart failure likely to be started on beta-blocker, ACE.

## 2019-04-14 NOTE — Assessment & Plan Note (Signed)
Stage I pressure ulcer to left lateral malleolus.  States he improved significantly from last visit.  Continue wound care as they have been doing, will recheck at next visit in 2 to 3 weeks.  Discussed alarm signs such as increased swelling, erythema, purulent discharge.

## 2019-04-14 NOTE — Assessment & Plan Note (Signed)
Has been improving with 40 mg twice daily Lasix.  Will get bilateral lower extremity ultrasound and echo to evaluate for other causes.  Had normal kidney and liver function at last visit.

## 2019-04-20 ENCOUNTER — Other Ambulatory Visit: Payer: Self-pay

## 2019-04-20 ENCOUNTER — Other Ambulatory Visit: Payer: Self-pay | Admitting: Family Medicine

## 2019-04-20 ENCOUNTER — Ambulatory Visit (HOSPITAL_COMMUNITY)
Admission: RE | Admit: 2019-04-20 | Discharge: 2019-04-20 | Disposition: A | Discharge status: Home / Self Care | Payer: BC Managed Care – PPO | Source: Ambulatory Visit | Attending: Family Medicine | Admitting: Family Medicine

## 2019-04-20 ENCOUNTER — Ambulatory Visit (HOSPITAL_BASED_OUTPATIENT_CLINIC_OR_DEPARTMENT_OTHER)
Admission: RE | Admit: 2019-04-20 | Discharge: 2019-04-20 | Disposition: A | Discharge status: Home / Self Care | Payer: BC Managed Care – PPO | Source: Ambulatory Visit | Attending: Family Medicine | Admitting: Family Medicine

## 2019-04-20 ENCOUNTER — Other Ambulatory Visit (HOSPITAL_COMMUNITY): Payer: BC Managed Care – PPO

## 2019-04-20 DIAGNOSIS — I5021 Acute systolic (congestive) heart failure: Secondary | ICD-10-CM

## 2019-04-20 DIAGNOSIS — R609 Edema, unspecified: Secondary | ICD-10-CM

## 2019-04-20 NOTE — Progress Notes (Signed)
VASCULAR LAB PRELIMINARY  PRELIMINARY  PRELIMINARY  PRELIMINARY  Bilateral lower extremity venous duplex completed.    Preliminary report:  See CV proc for preliminary results.  Called results to Dr. Levonne Spiller, [facility], [facility]T 04/20/2019, 2:44 PM

## 2019-04-20 NOTE — Progress Notes (Signed)
  Echocardiogram 2D Echocardiogram has been performed.  Anita Hunter 04/20/2019, 3:34 PM

## 2019-04-27 ENCOUNTER — Telehealth: Payer: Self-pay | Admitting: Family Medicine

## 2019-04-27 NOTE — Telephone Encounter (Signed)
Pt is calling about paperwork that was filled out, pt states that Anita Hunter is saying there is no start and end date and that it needs to be on there. I placed paperwork back in box. Thanks

## 2019-04-28 NOTE — Telephone Encounter (Signed)
Added new information and placed back in fax box on 3/15

## 2019-05-07 ENCOUNTER — Telehealth: Payer: Self-pay | Admitting: Family Medicine

## 2019-05-07 NOTE — Telephone Encounter (Signed)
Pt's sister Anita Hunter called reporting patient is experiencing sever foot pain. Want to know if patient can be sent for an MRI for her foot. Pls call sister at [phone]    Pt. Ph# is [phone]    Last appt was 04/13/19

## 2019-05-11 ENCOUNTER — Telehealth: Payer: Self-pay | Admitting: Family Medicine

## 2019-05-11 ENCOUNTER — Ambulatory Visit: Payer: BC Managed Care – PPO | Admitting: Family Medicine

## 2019-05-11 MED ORDER — GABAPENTIN 300 MG PO CAPS
600.00 | ORAL_CAPSULE | ORAL | Status: DC
Start: 2019-05-11 — End: 2019-05-11

## 2019-05-11 MED ORDER — DEXTROSE 10 % IV SOLN
125.00 | INTRAVENOUS | Status: DC
Start: ? — End: 2019-05-11

## 2019-05-11 MED ORDER — GLUCAGON (RDNA) 1 MG IJ KIT
1.00 | PACK | INTRAMUSCULAR | Status: DC
Start: ? — End: 2019-05-11

## 2019-05-11 MED ORDER — INSULIN GLARGINE 100 UNIT/ML ~~LOC~~ SOLN
45.00 | SUBCUTANEOUS | Status: DC
Start: 2019-05-12 — End: 2019-05-11

## 2019-05-11 MED ORDER — MELATONIN 3 MG PO TABS
3.00 | ORAL_TABLET | ORAL | Status: DC
Start: ? — End: 2019-05-11

## 2019-05-11 MED ORDER — ENOXAPARIN SODIUM 40 MG/0.4ML ~~LOC~~ SOLN
40.00 | SUBCUTANEOUS | Status: DC
Start: 2019-05-11 — End: 2019-05-11

## 2019-05-11 MED ORDER — TRAMADOL HCL 50 MG PO TABS
50.00 | ORAL_TABLET | ORAL | Status: DC
Start: ? — End: 2019-05-11

## 2019-05-11 MED ORDER — HYDRALAZINE HCL 10 MG PO TABS
10.00 | ORAL_TABLET | ORAL | Status: DC
Start: ? — End: 2019-05-11

## 2019-05-11 MED ORDER — ALUM & MAG HYDROXIDE-SIMETH 200-200-20 MG/5ML PO SUSP
30.00 | ORAL | Status: DC
Start: ? — End: 2019-05-11

## 2019-05-11 MED ORDER — ACETAMINOPHEN 325 MG PO TABS
650.00 | ORAL_TABLET | ORAL | Status: DC
Start: ? — End: 2019-05-11

## 2019-05-11 MED ORDER — SUMATRIPTAN SUCCINATE 6 MG/0.5ML ~~LOC~~ SOLN
6.00 | SUBCUTANEOUS | Status: DC
Start: 2019-05-11 — End: 2019-05-11

## 2019-05-11 MED ORDER — LIDOCAINE 5 % EX OINT
TOPICAL_OINTMENT | CUTANEOUS | Status: DC
Start: 2019-05-12 — End: 2019-05-11

## 2019-05-11 MED ORDER — DOXYCYCLINE HYCLATE 100 MG PO TABS
100.00 | ORAL_TABLET | ORAL | Status: DC
Start: 2019-05-11 — End: 2019-05-11

## 2019-05-11 MED ORDER — FLUTICASONE PROPIONATE 50 MCG/ACT NA SUSP
2.00 | NASAL | Status: DC
Start: 2019-05-11 — End: 2019-05-11

## 2019-05-11 MED ORDER — GLUCOSE 40 % PO GEL
15.00 | ORAL | Status: DC
Start: ? — End: 2019-05-11

## 2019-05-11 MED ORDER — OXYCODONE HCL 5 MG PO TABS
5.00 | ORAL_TABLET | ORAL | Status: DC
Start: ? — End: 2019-05-11

## 2019-05-11 MED ORDER — ALBUTEROL SULFATE HFA 108 (90 BASE) MCG/ACT IN AERS
2.00 | INHALATION_SPRAY | RESPIRATORY_TRACT | Status: DC
Start: ? — End: 2019-05-11

## 2019-05-11 MED ORDER — CYCLOBENZAPRINE HCL 10 MG PO TABS
10.00 | ORAL_TABLET | ORAL | Status: DC
Start: ? — End: 2019-05-11

## 2019-05-11 MED ORDER — DSS 100 MG PO CAPS
100.00 | ORAL_CAPSULE | ORAL | Status: DC
Start: ? — End: 2019-05-11

## 2019-05-11 MED ORDER — ONDANSETRON 4 MG PO TBDP
4.00 | ORAL_TABLET | ORAL | Status: DC
Start: ? — End: 2019-05-11

## 2019-05-11 MED ORDER — TORSEMIDE 10 MG PO TABS
20.00 | ORAL_TABLET | ORAL | Status: DC
Start: 2019-05-12 — End: 2019-05-11

## 2019-05-11 MED ORDER — NORTRIPTYLINE HCL 25 MG PO CAPS
25.00 | ORAL_CAPSULE | ORAL | Status: DC
Start: 2019-05-11 — End: 2019-05-11

## 2019-05-11 MED ORDER — INSULIN LISPRO 100 UNIT/ML ~~LOC~~ SOLN
3.00 | SUBCUTANEOUS | Status: DC
Start: 2019-05-11 — End: 2019-05-11

## 2019-05-11 NOTE — Progress Notes (Deleted)
   Subjective:   Patient ID: Anita Hunter    DOB: Dec 27, 1990, 29 y.o. female   MRN: [id number]  Anita Hunter is a 29 y.o. female with a history of CHF, marijuana abuse, chronic sinusitis, OSA noncompliant with CPAP, pulmonary HTN, seasonal allergies, NASH, T2DM, polyneuropathy, eczema, h/o pressure injuries to the skin, tobacco use disorder, headaches, hypertriglyceridemia, anemia  here for "sores on feet".  "Sores on Feet": Patient presents today for "sores on feet".  admitted on 05/09/2019 with bilateral lower extremity swelling and left leg wound.   Review of Systems:  Per HPI.   Objective:   There were no vitals taken for this visit. Vitals and nursing note reviewed.  General: well nourished, well developed, in no acute distress with non-toxic appearance HEENT: normocephalic, atraumatic, moist mucous membranes Neck: supple, non-tender without lymphadenopathy CV: regular rate and rhythm without murmurs, rubs, or gallops, no lower extremity edema Lungs: clear to auscultation bilaterally with normal work of breathing Abdomen: soft, non-tender, non-distended, no masses or organomegaly palpable, normoactive bowel sounds Skin: warm, dry, no rashes or lesions Extremities: warm and well perfused, normal tone MSK: ROM grossly intact, strength intact, gait normal Neuro: Alert and oriented, speech normal  Assessment & Plan:   No problem-specific Assessment & Plan notes found for this encounter.  No orders of the defined types were placed in this encounter.  No orders of the defined types were placed in this encounter.   Mina Marble, DO PGY-2, [facility] 05/11/2019 8:59 AM

## 2019-05-11 NOTE — Telephone Encounter (Signed)
Pt's sister called pt is in the hospital has been in hospital since Saturday, was suppose to be released yesterday but wound doctor didn't come till today, but still isn't going home today due to infection on bottom of feet. Giving antibiotics and wanting to keep her and see if they are helping. Thanks

## 2019-05-19 ENCOUNTER — Telehealth: Payer: Self-pay | Admitting: Family Medicine

## 2019-05-19 NOTE — Telephone Encounter (Signed)
I have not received any forms from [name], I will be happy to fill these forms as soon as possible once I receive them.  Can you please have the patient or Anita Hunter refax these or perhaps send them by some other means so that I can get them and fill them out as soon as possible.  [name] MD PGY-3 Family Medicine Resident

## 2019-05-19 NOTE — Telephone Encounter (Signed)
Pt is calling to check on the status of having her forms completed for [employer]. He states she needs them asap due to not being able to get her checks.   Pt states they were faxed on 03/31. I do not see them in Dr. Fara Boros box. I informed the patient he may have them already working on them.

## 2019-05-20 NOTE — Telephone Encounter (Signed)
Informed pt and she said they have faxed it multiple times, even when I talked to her she just got off the phone with them. I checked up front for the forms and didn't find anything. Shae made sure she had he right fax number for them to fax again. Anita Hunter Holter, CMA

## 2019-05-26 ENCOUNTER — Encounter: Payer: Self-pay | Admitting: Family Medicine

## 2019-05-26 ENCOUNTER — Ambulatory Visit (INDEPENDENT_AMBULATORY_CARE_PROVIDER_SITE_OTHER): Payer: BC Managed Care – PPO | Admitting: Family Medicine

## 2019-05-26 ENCOUNTER — Other Ambulatory Visit: Payer: Self-pay

## 2019-05-26 ENCOUNTER — Other Ambulatory Visit: Payer: Self-pay | Admitting: Family Medicine

## 2019-05-26 DIAGNOSIS — G6281 Critical illness polyneuropathy: Secondary | ICD-10-CM

## 2019-05-26 DIAGNOSIS — R609 Edema, unspecified: Secondary | ICD-10-CM

## 2019-05-26 DIAGNOSIS — E1165 Type 2 diabetes mellitus with hyperglycemia: Secondary | ICD-10-CM

## 2019-05-26 DIAGNOSIS — R5381 Other malaise: Secondary | ICD-10-CM

## 2019-05-26 MED ORDER — BAYER CONTOUR LINK 2.4 W/DEVICE KIT: 1.0000 | PACK | Freq: Once | 0 refills | Status: DC

## 2019-05-26 MED ORDER — CETIRIZINE HCL 10 MG PO TABS: 10.0000 mg | ORAL_TABLET | Freq: Every day | ORAL | 0 refills | Status: DC

## 2019-05-26 MED ORDER — TRIAMCINOLONE ACETONIDE 0.5 % EX OINT: 1.0000 "application " | TOPICAL_OINTMENT | Freq: Two times a day (BID) | CUTANEOUS | 0 refills | Status: AC

## 2019-05-26 MED ORDER — FLUTICASONE PROPIONATE 50 MCG/ACT NA SUSP: 2.0000 | Freq: Every day | NASAL | 1 refills | Status: DC

## 2019-05-26 MED ORDER — ACETAMINOPHEN-CODEINE #3 300-30 MG PO TABS: 1.0000 | ORAL_TABLET | ORAL | 0 refills | Status: DC | PRN

## 2019-05-26 NOTE — Patient Instructions (Signed)
It was great seeing you today!  I am glad you are doing much better.  You made a tremendous improvement from the first letter in January.  I sent in a prescription for Zyrtec, Flonase, Tylenol 3.  I gave you a copy of her EMG report.  I gave you a signed copy for handicap placard.  And I sent in the new glucose monitor for you.  I will see back in about a month and a half or so for A1c check and to check in on how you are doing.

## 2019-05-27 ENCOUNTER — Other Ambulatory Visit: Payer: Self-pay | Admitting: Family Medicine

## 2019-05-28 NOTE — Assessment & Plan Note (Signed)
Has improved significantly from the standpoint.  Will not resolve problem, but if continues to improve and continues to be able to walk around without aid can consider resolved the next appointment.

## 2019-05-28 NOTE — Assessment & Plan Note (Signed)
Due for A1c in around a month.  Taking Tresiba 40 units daily, Metformin 500 mg twice daily.  Will wait for next A1c can likely increase Metformin and decrease the [place].

## 2019-05-28 NOTE — Assessment & Plan Note (Signed)
The patient has had marked weight loss.  Down to 287, around 375 LBS on hospital admission back in December.  She is now trying to eat a lot better

## 2019-05-28 NOTE — Assessment & Plan Note (Signed)
Work markedly improved.  Down to 1+ bilaterally.  Can likely decrease the Lasix at next appointment.

## 2019-05-28 NOTE — Assessment & Plan Note (Signed)
Slowly improving as expected.  This diagnosis essentially confirmed with EMG and nerve conduction study.  Patient feels that Tylenol 3 is helping her pain the next, will slowly wean this off over time.  Can follow-up with neurology as scheduled.

## 2019-05-28 NOTE — Progress Notes (Signed)
   CHIEF COMPLAINT / HPI: 29 year old female who presents for hospital follow-up.  In brief the patient was hospitalized with severe Covid back in December/2020.  She was finally discharged to rehab on 02/20/2019.  She has had a long road to recovery as she has had severe neuropathic pain, increased bilateral lower extremity swelling, and mobility issues.  Patient is following up from a recent hospitalization on 05/09/2019 for cellulitis of a known left lateral malleolus diabetic pressure ulcer.  This was treated with doxycycline and Augmentin after transition from IV antibiotics.  Fortunately the patient is doing very well now.  She is able to walk in without equipment aid at this point.  Her bilateral extremity swelling has improved tremendously.  Patient states that she now has some mild burning bilateral lower extremities, but this has become much more bearable.  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  PERTINENT  PMH / PSH:    OBJECTIVE: BP 122/90   Pulse (!) 103   Ht 5' 9"  (1.753 m)   Wt 287 lb (130.2 kg)   LMP 01/26/2019   SpO2 96%   BMI 42.38 kg/m   Gen: Very pleasant 29 year old African-American female, no acute distress CV: Skin warm and dry Resp: No respiratory distress, no accessory muscle use Neuro: No focal neurologic deficit, symmetric strength bilateral lower extremity. Bilateral legs: Greatly improved lower extremity swelling, 1+ at this point.  Known left lateral malleolus ulcer is healed significantly, with only a surface level abrasion still noted.  ASSESSMENT / PLAN:  Critical illness polyneuropathy ([place]) Slowly improving as expected.  This diagnosis essentially confirmed with EMG and nerve conduction study.  Patient feels that Tylenol 3 is helping her pain the next, will slowly wean this off over time.  Can follow-up with neurology as scheduled.  Type 2  diabetes mellitus ([place]) Due for A1c in around a month.  Taking Tresiba 40 units daily, Metformin 500 mg twice daily.  Will wait for next A1c can likely increase Metformin and decrease the [place].  Morbid obesity ([place]) The patient has had marked weight loss.  Down to 287, around 375 LBS on hospital admission back in December.  She is now trying to eat a lot better  Debility Has improved significantly from the standpoint.  Will not resolve problem, but if continues to improve and continues to be able to walk around without aid can consider resolved the next appointment.  Edema Work markedly improved.  Down to 1+ bilaterally.  Can likely decrease the Lasix at next appointment.   [name] MD PGY-3 Family Medicine Resident [place]

## 2019-05-30 NOTE — Telephone Encounter (Signed)
Finally received and completed 05/19/2019 placed in fax pile  [name] MD PGY-3 Family Medicine Resident

## 2019-07-02 ENCOUNTER — Other Ambulatory Visit: Payer: Self-pay

## 2019-07-02 ENCOUNTER — Ambulatory Visit (INDEPENDENT_AMBULATORY_CARE_PROVIDER_SITE_OTHER): Payer: BC Managed Care – PPO | Admitting: Family Medicine

## 2019-07-02 ENCOUNTER — Encounter: Payer: Self-pay | Admitting: Family Medicine

## 2019-07-02 VITALS — BP 112/72 | HR 94 | Wt 290.4 lb

## 2019-07-02 DIAGNOSIS — L89521 Pressure ulcer of left ankle, stage 1: Secondary | ICD-10-CM | POA: Diagnosis not present

## 2019-07-02 DIAGNOSIS — Z8679 Personal history of other diseases of the circulatory system: Secondary | ICD-10-CM

## 2019-07-02 DIAGNOSIS — E1165 Type 2 diabetes mellitus with hyperglycemia: Secondary | ICD-10-CM

## 2019-07-02 DIAGNOSIS — G6281 Critical illness polyneuropathy: Secondary | ICD-10-CM | POA: Diagnosis not present

## 2019-07-02 LAB — POCT GLYCOSYLATED HEMOGLOBIN (HGB A1C): HbA1c, POC (controlled diabetic range): 6.3 % (ref 0.0–7.0)

## 2019-07-02 NOTE — Patient Instructions (Signed)
It was great seeing you again today!  I am glad things are going well.  Since you are still having the swelling and the bad neuropathic pain intermittently I think it is a good idea to continue to hold your work.  What I will do is I will follow FMLA paperwork for the rest of June, July, and the first week of August.  Around that time I recommend coming in for an appointment to meet your new PCP after I leave.  I think you will be ready for work at that time, but they can make that determination at that visit.  Thanks for filling out the release of information form, dispense information is syndrome for your records.  Please have your employer send me a new disability/FMLA form so that I can extend it until August.  Your A1c came back at 6.3.  This is great and places you only in the prediabetic range.  We can recheck this in August to make sure this continues to go down.  I think it is okay to not take any of the medications at this point and continue your healthy living.

## 2019-07-03 ENCOUNTER — Encounter: Payer: Self-pay | Admitting: Family Medicine

## 2019-07-03 NOTE — Assessment & Plan Note (Signed)
Weight stable at 290 from 287 at last visit.  Down from 375 at hospital admission back in December.  She has made numerous lifestyle changes.  BMI still 42.8 so still has some work to do.  Once all of her acute issues are complete resolve this will be the biggest focus going forward.

## 2019-07-03 NOTE — Assessment & Plan Note (Signed)
Much improved.  6.3 today from 7.6 back in February.  At time of hospitalization her A1c was 11.2. she stopped all of her medications.  I would like to see her back in 3 months for another A1c check to ensure that her diabetes remains well controlled with just lifestyle modifications alone.  Can establish with new PCP at that time.

## 2019-07-03 NOTE — Progress Notes (Signed)
   HPI 29 year old female who presents for follow-up.  Short the patient had a severe Covid infection required in almost a month inpatient stay in rehab, including a nearly 2-week intubation.  Patient has made tremendous gains and is slowly approaching her baseline functional status prior to the infection.  She is presenting today for FMLA paperwork renewal and A1c check.  She states that she has been feeling so good that she stopped taking her medications for diabetes altogether.  Her main complaints are intermittent bilateral lower extremity swelling, much improved from previous encounters.  As well as burning, aching sensation in bilateral lower extremities.  Objective: BP 112/72   Pulse 94   Wt 290 lb 6.4 oz (131.7 kg)   SpO2 99%   BMI 42.88 kg/m  Gen: 29 year old African-American female, no acute distress, very comfortable CV: Regular rate rhythm, no R/G.  1+ pitting edema bilateral lower extremities.  Palpable PT/DP bilaterally Resp: Lungs clear to auscultation bilaterally, no accessory muscle use Neuro: Alert and oriented, Speech clear, No gross deficits   Assessment and plan:  History of acquired CHF (congestive heart failure) Last echo showing EF 55 to 60%.  Does still have mild bilateral lower extremity swelling, but will consider this problem resolved.  Likely secondary to her critical illness and perhaps due to her Covid infection.  Pressure injury of skin This is healed very well and there is only a small bit of residual scarring from this.  We will consider this problem resolved  Critical illness polyneuropathy ([place]) Still with some complaints of neuropathy.  Likely secondary to her critical illness and has been seen neurology for this issue.  Has been counseled this will take likely significant time to resolve completely if resolved completely.  Patient would prefer to just "stick it out" without any medication assistance.  Can prescribe gabapentin if patient changes her  mind.  Type 2 diabetes mellitus ([place]) Much improved.  6.3 today from 7.6 back in February.  At time of hospitalization her A1c was 11.2. she stopped all of her medications.  I would like to see her back in 3 months for another A1c check to ensure that her diabetes remains well controlled with just lifestyle modifications alone.  Can establish with new PCP at that time.  Morbid obesity ([place]) Weight stable at 290 from 287 at last visit.  Down from 375 at hospital admission back in December.  She has made numerous lifestyle changes.  BMI still 42.8 so still has some work to do.  Once all of her acute issues are complete resolve this will be the biggest focus going forward.   Orders Placed This Encounter  Procedures  . HgB A1c    No orders of the defined types were placed in this encounter.    [name] MD PGY-3 Family Medicine Resident  07/03/2019 10:56 AM

## 2019-07-03 NOTE — Assessment & Plan Note (Signed)
This is healed very well and there is only a small bit of residual scarring from this.  We will consider this problem resolved

## 2019-07-03 NOTE — Assessment & Plan Note (Signed)
Last echo showing EF 55 to 60%.  Does still have mild bilateral lower extremity swelling, but will consider this problem resolved.  Likely secondary to her critical illness and perhaps due to her Covid infection.

## 2019-07-03 NOTE — Assessment & Plan Note (Signed)
Still with some complaints of neuropathy.  Likely secondary to her critical illness and has been seen neurology for this issue.  Has been counseled this will take likely significant time to resolve completely if resolved completely.  Patient would prefer to just "stick it out" without any medication assistance.  Can prescribe gabapentin if patient changes her mind.

## 2019-07-07 ENCOUNTER — Telehealth: Payer: Self-pay | Admitting: Family Medicine

## 2019-07-07 NOTE — Telephone Encounter (Signed)
Patient is calling to check on the status of having her last office visit and diagnosis faxed to [facility]. She said she filled out a ROI at her last appointment 07/02/19. I have not received a ROI for this patient.   I informed her that I would ask if Dr. Kris Mouton still has the form and also suggested that she call [employer] and ask them to fax a request for exactly what is needed.   Please call patient with any questions the best call back is [phone]

## 2019-07-09 NOTE — Telephone Encounter (Signed)
I do have the patient's release of information form. As I explained to her during her clinic visit, she needs to have [employer] claims send me the papers that need to be filled out. The release of information form just allows me to share her medical information with [employer], but is not the actual form that needs to be filled out for short term disability.  [name] MD PGY-3 Family Medicine Resident

## 2019-07-23 NOTE — Telephone Encounter (Signed)
Patient is calling back concerning the documentation that [employer] needs faxed back. I informed patient of the messages below. She said [employer] says that they do not need a new short term disability form filled out. They would just like the records that are requested on the ROI that Dr. Kris Mouton stated below that he has. They also would like a letter written with her return to work dates and the claim number on the letter as well. This will need to be faxed to [phone]  Patients Claim # : [id number]  Please call patient with any questions  [phone]

## 2019-07-27 NOTE — Telephone Encounter (Signed)
Placed requested forms and office notes in the fax pile on 6/10  [name] MD PGY-3 Family Medicine Resident

## 2019-09-08 ENCOUNTER — Ambulatory Visit: Payer: BC Managed Care – PPO | Admitting: Student in an Organized Health Care Education/Training Program

## 2019-09-09 ENCOUNTER — Other Ambulatory Visit: Payer: Self-pay

## 2019-09-16 ENCOUNTER — Ambulatory Visit (INDEPENDENT_AMBULATORY_CARE_PROVIDER_SITE_OTHER): Payer: BC Managed Care – PPO | Admitting: Student in an Organized Health Care Education/Training Program

## 2019-09-16 ENCOUNTER — Other Ambulatory Visit: Payer: Self-pay

## 2019-09-16 ENCOUNTER — Encounter: Payer: Self-pay | Admitting: Student in an Organized Health Care Education/Training Program

## 2019-09-16 DIAGNOSIS — R5381 Other malaise: Secondary | ICD-10-CM | POA: Diagnosis not present

## 2019-09-16 NOTE — Progress Notes (Signed)
    SUBJECTIVE:   CHIEF COMPLAINT / HPI: meet new PCP, form for disability at work  Back pain from injury 2018.  Chronic and unchanged.  Patient has been out of work for a prolonged period of time but would like to start working again.  She needs a form filled out for her work that describes any of her limitations.  She has not sure what type of activity she will be able to do but she is worried that her workplace will push her to do more than she is capable of and she will get injured. She feels confident that she could sit for prolonged periods at work but is unable to stand or walk for prolonged period of time.  She also has problems with lifting heavy objects or twisting motions.  She feels that she can manage walking from her vehicle into her workplace and around the store but has difficulty with balance prolonged. Patient is feeling better now that she has lost some weight from diet changes.  OBJECTIVE:   BP 132/88   Pulse 87   Wt (!) 309 lb (140.2 kg)   SpO2 98%   BMI 45.63 kg/m   General: NAD, pleasant, able to participate in exam Back: Negative for pain on palpation of spine. Extremities: no edema or cyanosis. WWP. Skin: warm and dry, no rashes noted Neuro: alert and oriented x4, no focal deficits. Gait-patient able to ambulate around exam room without limp or pain. Psych: Normal affect and mood  ASSESSMENT/PLAN:   Debility -Completed work forms with patient's stated physical limitations although her abilities were difficult to assess as she has not been back to work in quite some time and does not fully know what she is capable of. -Continue physical therapy -Recommend using a cane when ambulating -Return if needs other help     Richarda Osmond, [place]

## 2019-09-16 NOTE — Patient Instructions (Signed)
It was a pleasure to see you today!  To summarize our discussion for this visit:  I have filled out your form for work. Please let me know if you need anything else.   Some additional health maintenance measures we should update are: Health Maintenance Due  Topic Date Due   Hepatitis C Screening  Never done   PNEUMOCOCCAL POLYSACCHARIDE VACCINE AGE 28-64 HIGH RISK  Never done   FOOT EXAM  Never done   OPHTHALMOLOGY EXAM  Never done   URINE MICROALBUMIN  Never done   COVID-19 Vaccine (1) Never done   INFLUENZA VACCINE  09/13/2019     Please return to our clinic to see me as needed.  Call the clinic at [phone] if your symptoms worsen or you have any concerns.   Thank you for allowing me to take part in your care,  Dr. Doristine Mango

## 2019-09-23 NOTE — Assessment & Plan Note (Signed)
-  Completed work forms with patient's stated physical limitations although her abilities were difficult to assess as she has not been back to work in quite some time and does not fully know what she is capable of. -Continue physical therapy -Recommend using a cane when ambulating -Return if needs other help

## 2019-09-24 ENCOUNTER — Encounter: Payer: Self-pay | Admitting: Student in an Organized Health Care Education/Training Program

## 2020-06-23 ENCOUNTER — Other Ambulatory Visit: Payer: Self-pay

## 2020-06-23 ENCOUNTER — Encounter: Payer: Self-pay | Admitting: Student in an Organized Health Care Education/Training Program

## 2020-06-23 ENCOUNTER — Ambulatory Visit (INDEPENDENT_AMBULATORY_CARE_PROVIDER_SITE_OTHER): Payer: BC Managed Care – PPO | Admitting: Student in an Organized Health Care Education/Training Program

## 2020-06-23 VITALS — BP 127/80 | HR 80 | Ht 69.0 in | Wt 364.6 lb

## 2020-06-23 DIAGNOSIS — J302 Other seasonal allergic rhinitis: Secondary | ICD-10-CM

## 2020-06-23 DIAGNOSIS — E1165 Type 2 diabetes mellitus with hyperglycemia: Secondary | ICD-10-CM

## 2020-06-23 DIAGNOSIS — G4733 Obstructive sleep apnea (adult) (pediatric): Secondary | ICD-10-CM

## 2020-06-23 DIAGNOSIS — M792 Neuralgia and neuritis, unspecified: Secondary | ICD-10-CM

## 2020-06-23 DIAGNOSIS — F3289 Other specified depressive episodes: Secondary | ICD-10-CM

## 2020-06-23 MED ORDER — FEXOFENADINE HCL 30 MG/5ML PO SUSP: 30.0000 mg | Freq: Every day | ORAL | 1 refills | Status: AC

## 2020-06-23 MED ORDER — FLUTICASONE PROPIONATE 50 MCG/ACT NA SUSP: 2.0000 | Freq: Every day | NASAL | 1 refills | Status: AC

## 2020-06-23 NOTE — Progress Notes (Signed)
   SUBJECTIVE:   CHIEF COMPLAINT / HPI: sleep study, foot problem f/u  F/u feet problem- continued since initial event dec 2020 . Severe covid resulting in intubation and prolonged ICU stay. Had to learn to walk again. Suffered from peripheral neuropathy since that stay. Felt like walking on wet sand.  Gabapentin from neurologist caused tremors and shakes so discontinued. Tried Celebrex without benefit.  For a period of time- did not eat any meat. Pain has been worsening with significant weight gain over the past year. She is having difficulty with daily functions such as walking and driving.  Obesity- significant weight gain associated with her hospital stay and subsequent depression. Has not had plan for diet and exercise implemented yet. She is interested in more information about bariatric surgery and/or medications to help lose weight.  Depression- patient believes that the depression is highly linked to over-eating and worsens her depression so is in a bad cycle. Denies SI. Open to counseling at this time but also feels a stigma around being treated for depression. Patient is tearful in her explanation of her struggles. No mania episodes.   Allergies- continues to have seasonal allergies and requests refills of flonase and antihistamine.   Sleep study- requesting a sleep study which is required by her work.   OBJECTIVE:   BP 127/80   Pulse 80   Ht 5' 9"  (1.753 m)   Wt (!) 364 lb 9.6 oz (165.4 kg)   LMP 01/13/2020 (Approximate)   SpO2 96%   BMI 53.84 kg/m   Physical Exam Vitals and nursing note reviewed.  Constitutional:      General: She is not in acute distress.    Appearance: She is obese. She is not ill-appearing or toxic-appearing.  Pulmonary:     Effort: Pulmonary effort is normal.  Skin:    General: Skin is warm.  Neurological:     Mental Status: She is alert.  Psychiatric:        Attention and Perception: Attention and perception normal.        Mood and Affect:  Mood is depressed. Affect is tearful.        Speech: Speech normal.        Behavior: Behavior normal.        Thought Content: Thought content normal.        Cognition and Memory: Cognition normal.        Judgment: Judgment normal.     ASSESSMENT/PLAN:   Neuropathic pain Obtaining labs to rule out contributions to neuropathy including B12, CBC, CMP  Seasonal allergic rhinitis Refilled flonase.  Prescribed liquid allegra, per patient request as she has difficulty swallowing pills  Obstructive sleep apnea Ordering repeat sleep study  Morbid obesity ([place]) Discussed diet/exercise.  Referral for bariatric surgery consult.  Diabetes screening today- A1c 6.8 Will discuss sitagliptin as option with patient when discussing results.   Depression Discussed medication and counseling.  Patient would like to proceed with counseling and is contemplating adding medication as well. Counseling resources provided. Follow up in 4 weeks or sooner if needed.    [place]

## 2020-06-23 NOTE — Patient Instructions (Signed)
It was a pleasure to see you today!  To summarize our discussion for this visit:  For your neuropathy, we will check some labs today to see if they are contributing to your symptoms. Please follow up with podiatry.   For obesity- I think this is really contributing to your leg pain. In addition to diet and exercise, we should discuss medication as well as going to see a Ambulance person.   For depression- please go to [email or web address] to find a counselor who is right for you.   Come back and see me in about 4 weeks.  Some additional health maintenance measures we should update are: Health Maintenance Due  Topic Date Due  . PNEUMOCOCCAL POLYSACCHARIDE VACCINE AGE 62-64 HIGH RISK  Never done  . COVID-19 Vaccine (1) Never done  . FOOT EXAM  Never done  . OPHTHALMOLOGY EXAM  Never done  . URINE MICROALBUMIN  Never done  . Hepatitis C Screening  Never done  . HEMOGLOBIN A1C  01/02/2020  .    Call the clinic at [phone] if your symptoms worsen or you have any concerns.   Thank you for allowing me to take part in your care,  Dr. Doristine Mango

## 2020-06-24 ENCOUNTER — Encounter: Payer: Self-pay | Admitting: Student in an Organized Health Care Education/Training Program

## 2020-06-24 LAB — COMPREHENSIVE METABOLIC PANEL
ALT: 27 IU/L (ref 0–32)
AST: 20 IU/L (ref 0–40)
Albumin/Globulin Ratio: 1.1 — ABNORMAL LOW (ref 1.2–2.2)
Albumin: 4.1 g/dL (ref 3.9–5.0)
Alkaline Phosphatase: 87 IU/L (ref 44–121)
BUN/Creatinine Ratio: 13 (ref 9–23)
BUN: 9 mg/dL (ref 6–20)
Bilirubin Total: 0.3 mg/dL (ref 0.0–1.2)
CO2: 24 mmol/L (ref 20–29)
Calcium: 8.8 mg/dL (ref 8.7–10.2)
Chloride: 102 mmol/L (ref 96–106)
Creatinine, Ser: 0.72 mg/dL (ref 0.57–1.00)
Globulin, Total: 3.6 g/dL (ref 1.5–4.5)
Glucose: 112 mg/dL — ABNORMAL HIGH (ref 65–99)
Potassium: 4.2 mmol/L (ref 3.5–5.2)
Sodium: 140 mmol/L (ref 134–144)
Total Protein: 7.7 g/dL (ref 6.0–8.5)
eGFR: 117 mL/min/{1.73_m2} (ref >59)

## 2020-06-24 LAB — CBC
Hematocrit: 40.1 % (ref 34.0–46.6)
Hemoglobin: 12.7 g/dL (ref 11.1–15.9)
MCH: 26.7 pg (ref 26.6–33.0)
MCHC: 31.7 g/dL (ref 31.5–35.7)
MCV: 84 fL (ref 79–97)
Platelets: 285 10*3/uL (ref 150–450)
RBC: 4.76 x10E6/uL (ref 3.77–5.28)
RDW: 12.8 % (ref 11.7–15.4)
WBC: 9.7 10*3/uL (ref 3.4–10.8)

## 2020-06-24 LAB — VITAMIN B12: Vitamin B-12: 697 pg/mL (ref 232–1245)

## 2020-06-24 LAB — HEMOGLOBIN A1C
Est. average glucose Bld gHb Est-mCnc: 148 mg/dL
Hgb A1c MFr Bld: 6.8 % — ABNORMAL HIGH (ref 4.8–5.6)

## 2020-06-24 LAB — TSH: TSH: 0.767 u[IU]/mL (ref 0.450–4.500)

## 2020-06-27 DIAGNOSIS — F32A Depression, unspecified: Secondary | ICD-10-CM | POA: Insufficient documentation

## 2020-06-27 NOTE — Assessment & Plan Note (Signed)
Discussed diet/exercise.  Referral for bariatric surgery consult.  Diabetes screening today- A1c 6.8 Will discuss sitagliptin as option with patient when discussing results.

## 2020-06-27 NOTE — Assessment & Plan Note (Signed)
Discussed medication and counseling.  Patient would like to proceed with counseling and is contemplating adding medication as well. Counseling resources provided. Follow up in 4 weeks or sooner if needed.

## 2020-06-27 NOTE — Assessment & Plan Note (Signed)
Refilled flonase.  Prescribed liquid allegra, per patient request as she has difficulty swallowing pills

## 2020-06-27 NOTE — Assessment & Plan Note (Signed)
Ordering repeat sleep study

## 2020-06-27 NOTE — Assessment & Plan Note (Signed)
Obtaining labs to rule out contributions to neuropathy including B12, CBC, CMP

## 2020-10-30 IMAGING — CT CT RENAL STONE PROTOCOL
2 of 4 series · 16 of 46 positions shown, 18 images · non-contrast
Comparison: CT abdomen pelvis 10/23/2016

CLINICAL DATA: Bilateral lower abdominal pain for 2 days

EXAM:
CT ABDOMEN AND PELVIS WITHOUT CONTRAST
TECHNIQUE: Multidetector CT imaging of the abdomen and pelvis was performed
following the standard protocol without IV contrast.

[Series 2: axial st · axial · 0.98mm/px · z∈[-507,-7]mm · 13 of 110 slices shown, 15 images]
[im 5/110  soft-tissue]
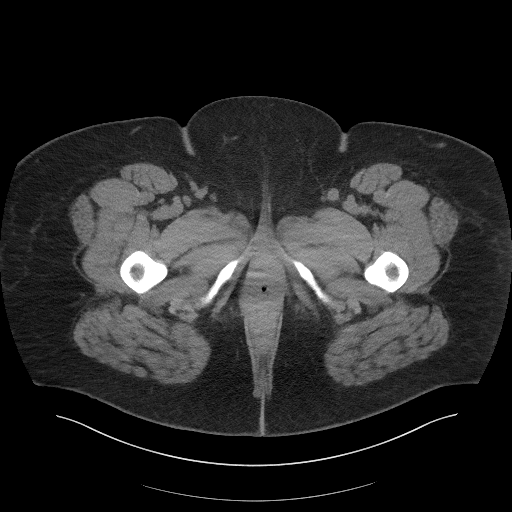
[im 5/110  bone]
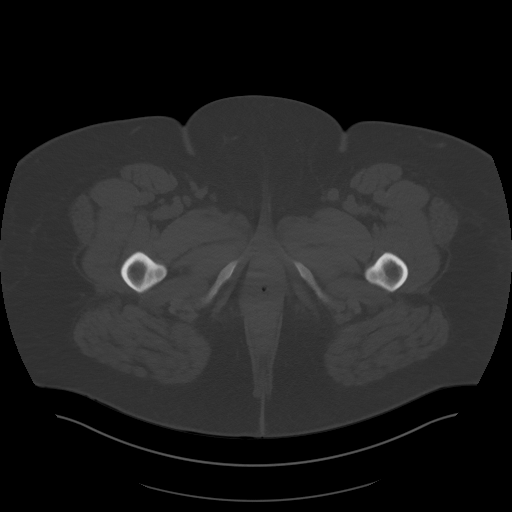
[im 15/110  soft-tissue]
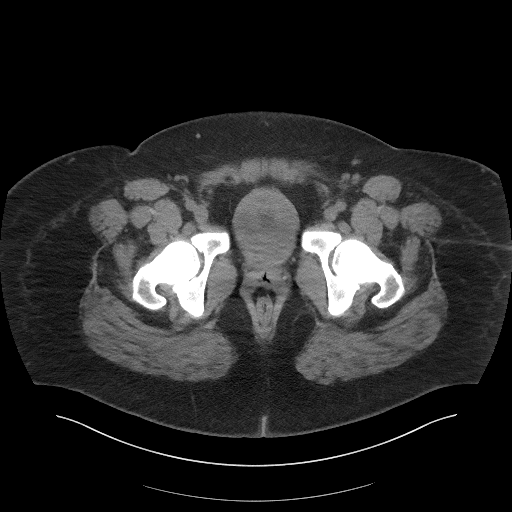
[im 24/110  soft-tissue]
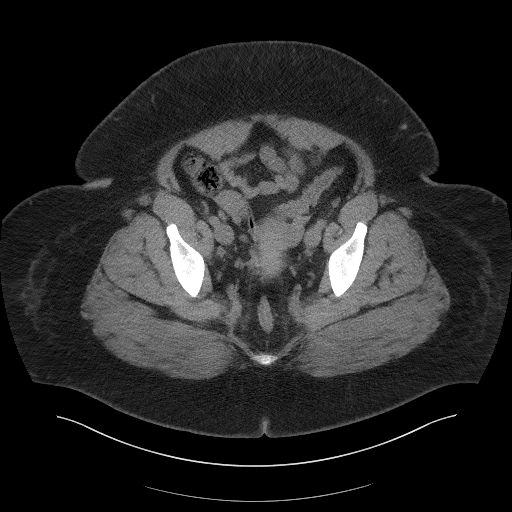
[im 29/110  soft-tissue]
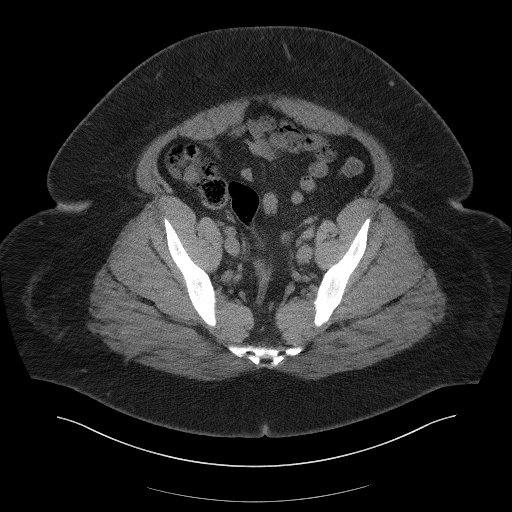
[im 38/110  soft-tissue]
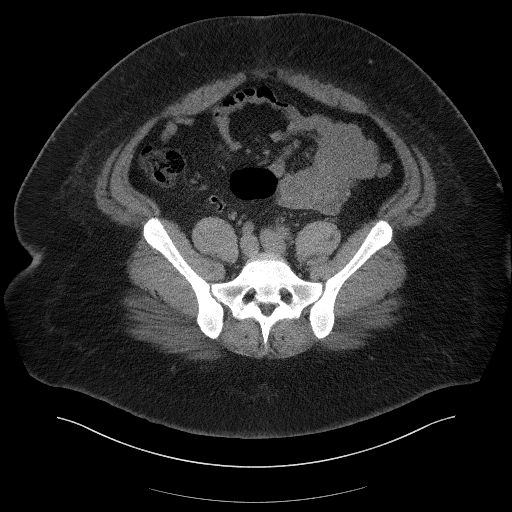
[im 48/110  soft-tissue]
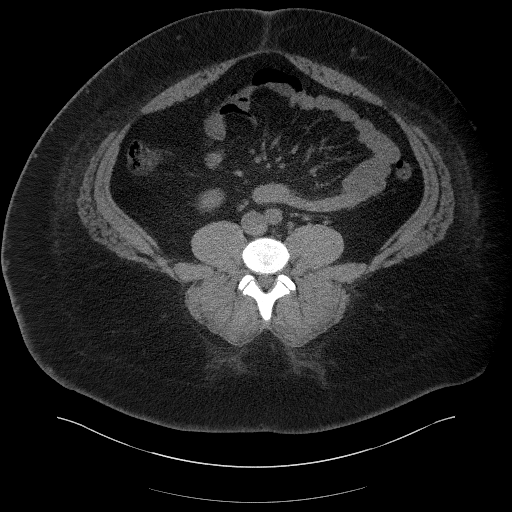
[im 57/110  soft-tissue]
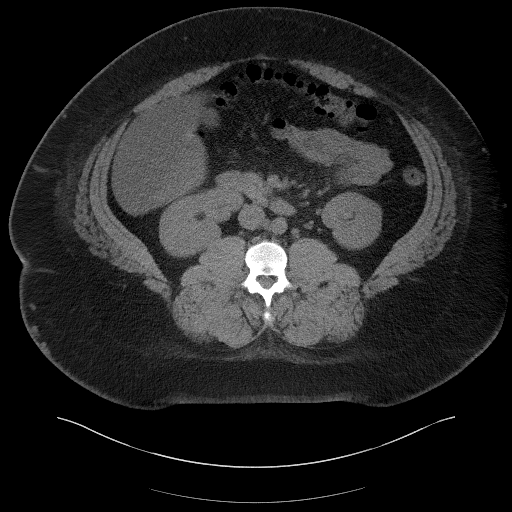
[im 62/110  soft-tissue]
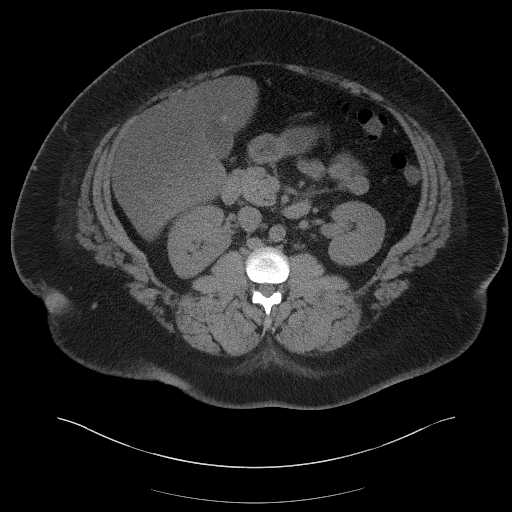
[im 72/110  soft-tissue]
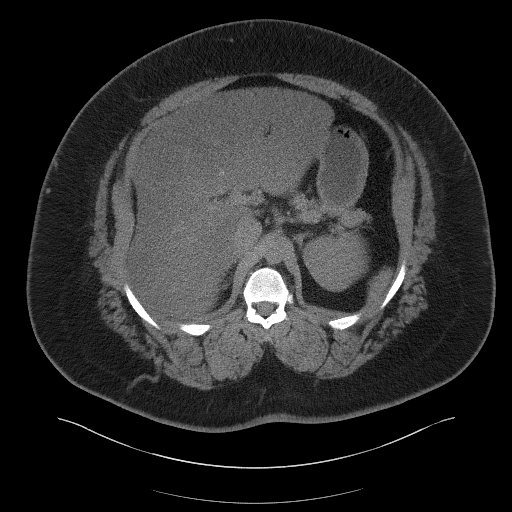
[im 72/110  bone]
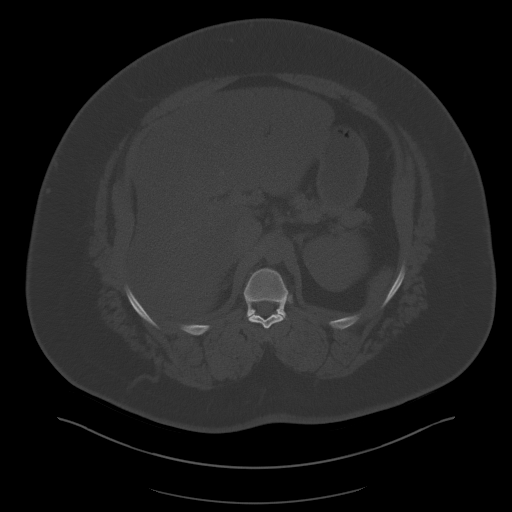
[im 81/110  soft-tissue]
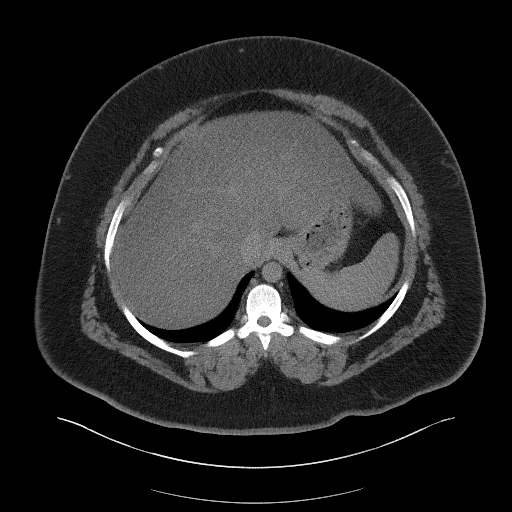
[im 86/110  soft-tissue]
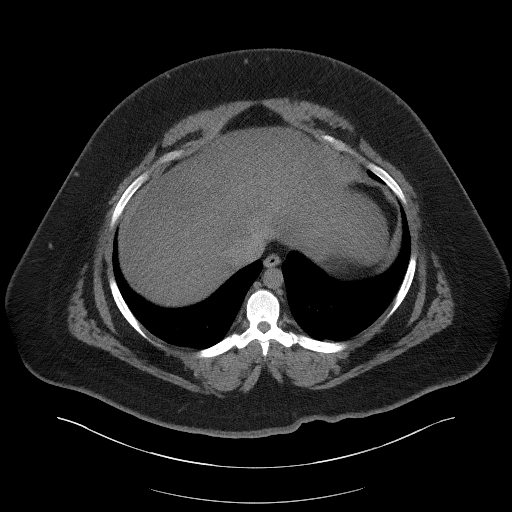
[im 95/110  soft-tissue]
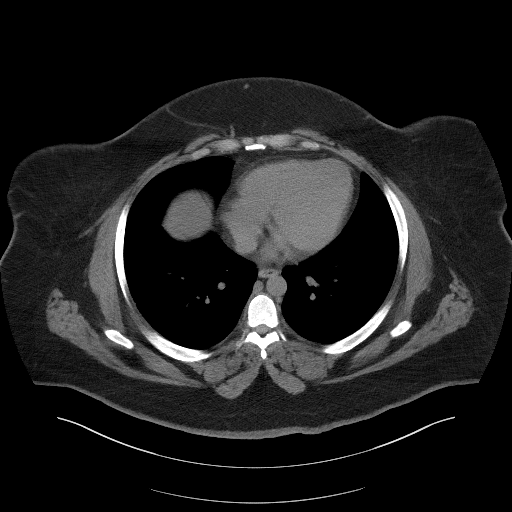
[im 105/110  soft-tissue]
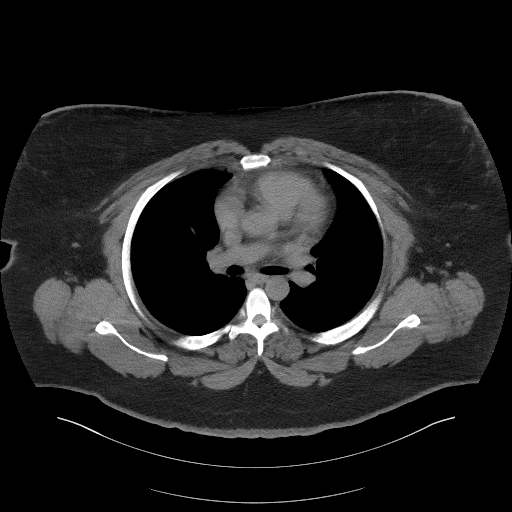

[Series 4: coronal st · coronal · 1.09mm/px · 3 of 112 slices shown]
[im 38/112  soft-tissue]
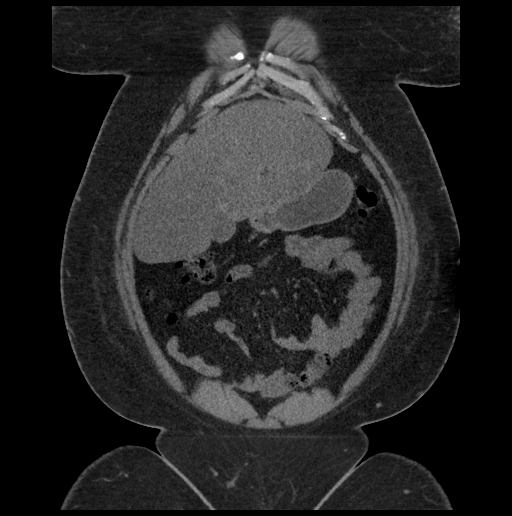
[im 50/112  soft-tissue]
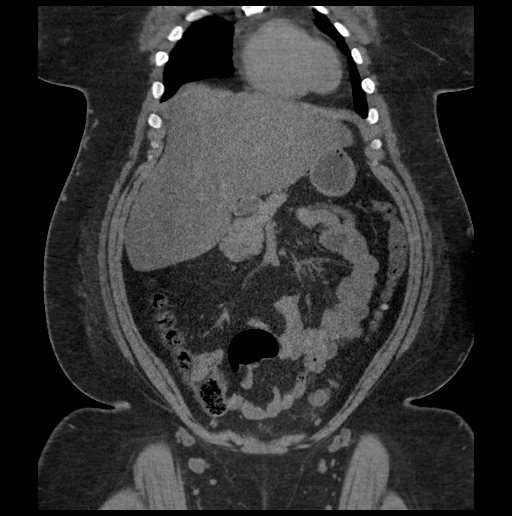
[im 62/112  soft-tissue]
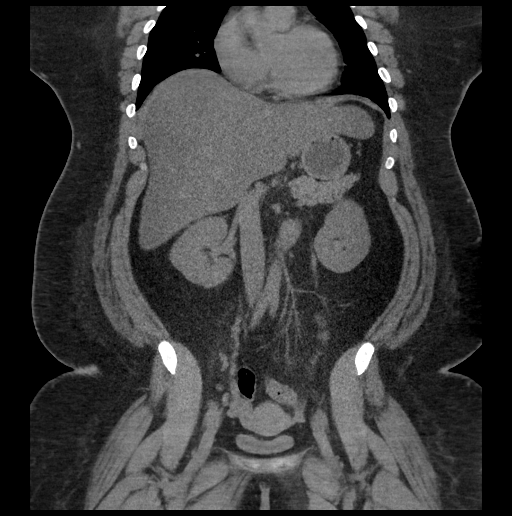

[16 of 46 positions shown; findings below may reference images not displayed]

FINDINGS: Lower chest: Lung bases are clear. Normal heart size. No pericardial
effusion.

Hepatobiliary: Diffuse hepatic hypoattenuation compatible with
hepatic steatosis. Sparing seen along the gallbladder fossa. No
concerning focal liver abnormality is seen. No gallstones,
gallbladder wall thickening, or biliary dilatation.

Pancreas: Unremarkable. No pancreatic ductal dilatation or
surrounding inflammatory changes.

Spleen: Normal in size without focal abnormality.

Adrenals/Urinary Tract: Adrenal glands are unremarkable. Kidneys are
normal, without renal calculi, focal lesion, or hydronephrosis.
Bladder is largely decompressed at the time of exam and therefore
poorly evaluated by CT though no gross abnormality is identified.

Stomach/Bowel: Distal esophagus, stomach and duodenal sweep are
unremarkable. No small bowel wall thickening or dilatation. No
evidence of obstruction. A normal appendix is visualized. Segmental
thickening and pericolonic stranding of the sigmoid colon. Fall
several colonic diverticula are present in the area of inflammation.
Inflammation does not appear centered upon a single culprit
diverticulum. No extraluminal gas, no organized collection or
abscess. Remaining portions of the colon are otherwise unremarkable.

Vascular/Lymphatic: The aorta is normal caliber. No suspicious or
enlarged lymph nodes in the included lymphatic chains.

Reproductive: Normal appearance of the uterus and adnexal
structures.

Other: No abdominopelvic free fluid or free gas. No bowel containing
hernias. Mild posterior body wall edema.

Musculoskeletal: No acute osseous abnormality or suspicious osseous
lesion.
IMPRESSION: 1. Segmental thickening of the sigmoid colon. While there is distal
colonic diverticulosis, inflammation does not appear focally
centered upon a diverticular outpouching, favoring a segmental
colitis of either inflammatory or infectious etiology.
Diverticulitis is a possibility but is less favored. No evidence of
perforation or abscess formation.
2. Hepatic steatosis.

## 2020-12-28 IMAGING — DX DG CHEST 1V PORT
1 series · 1 of 1 positions shown · non-contrast
Comparison: None.

CLINICAL DATA: Cough.  Coronavirus infection.

EXAM:
PORTABLE CHEST 1 VIEW

[chest ap]
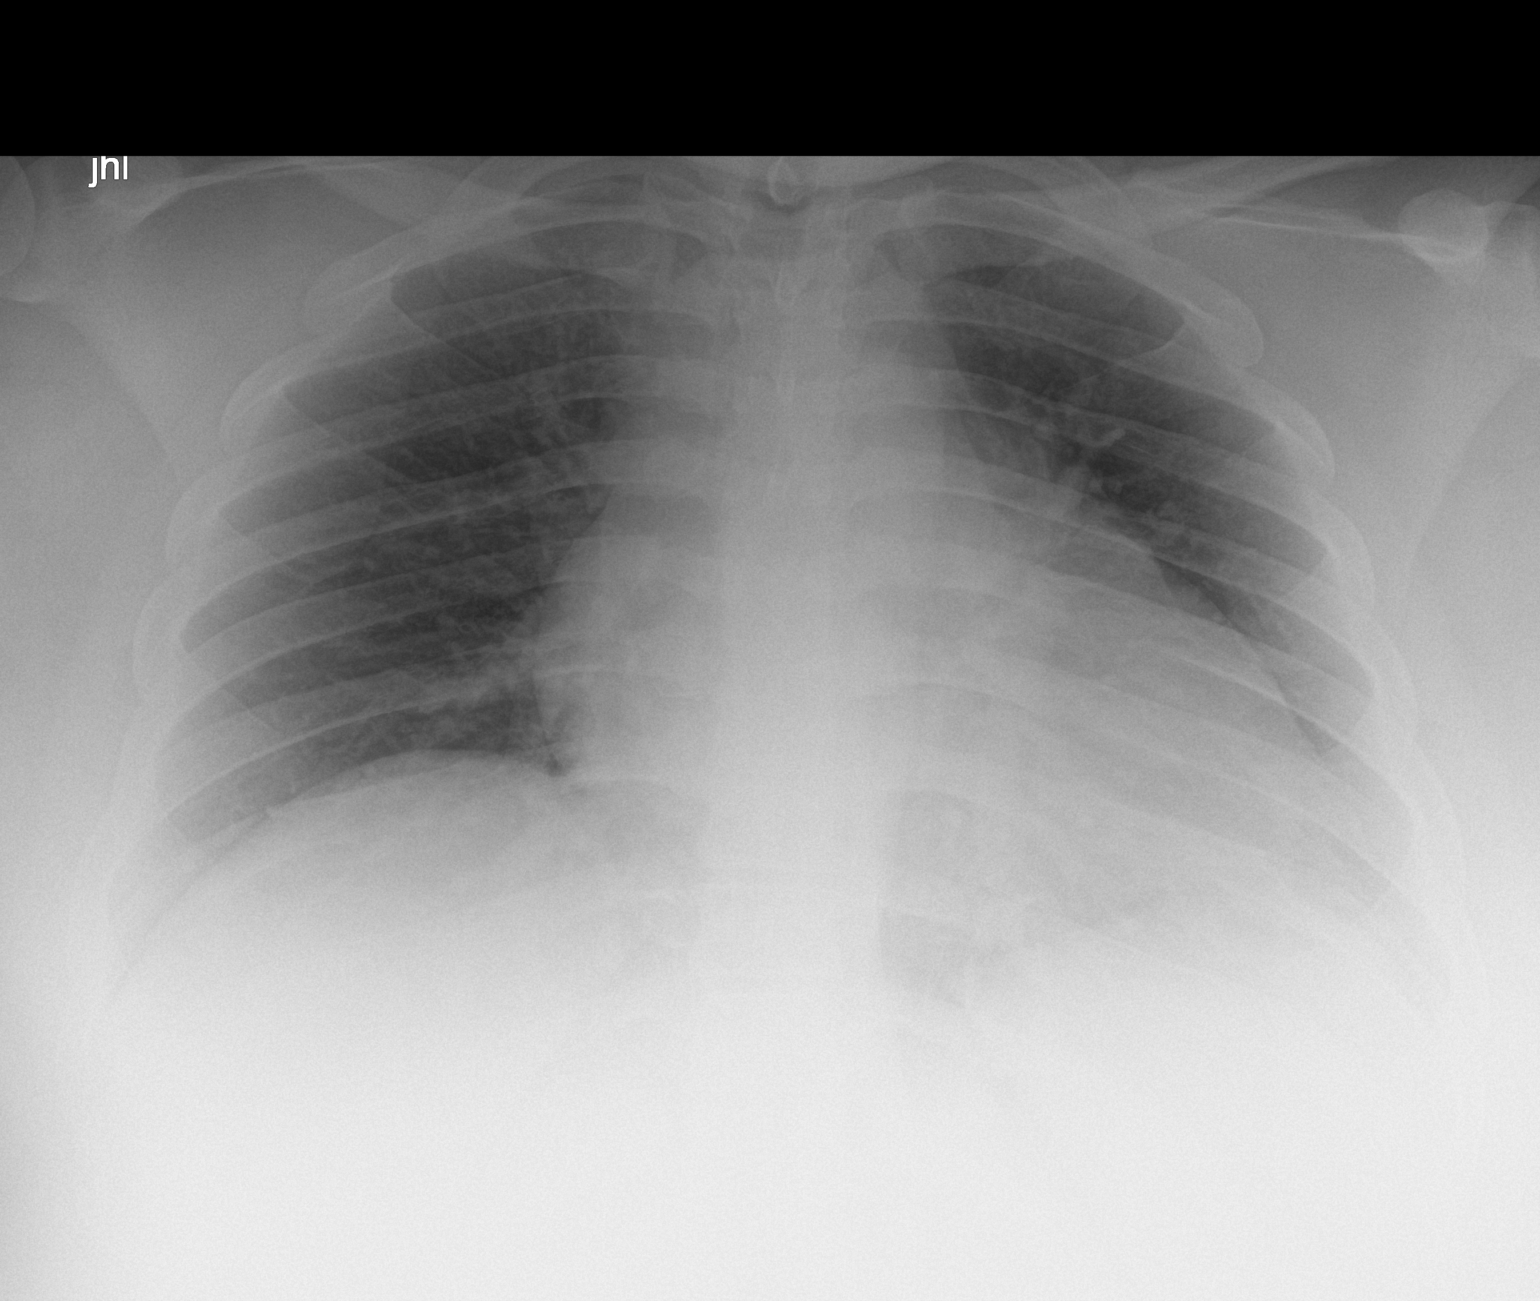

[1 of 1 positions shown; findings below may reference images not displayed]

FINDINGS: The heart size and mediastinal contours are within normal limits.
Both lungs are clear. The visualized skeletal structures are
unremarkable.
IMPRESSION: No active disease.

## 2020-12-31 IMAGING — DX DG CHEST 1V PORT
1 series · 1 of 1 positions shown · non-contrast
Comparison: 02/05/2019

CLINICAL DATA: Increasing shortness of breath. Low oxygen
saturation. MBPCY-IH positive.

EXAM:
PORTABLE CHEST 1 VIEW

[chest ap]
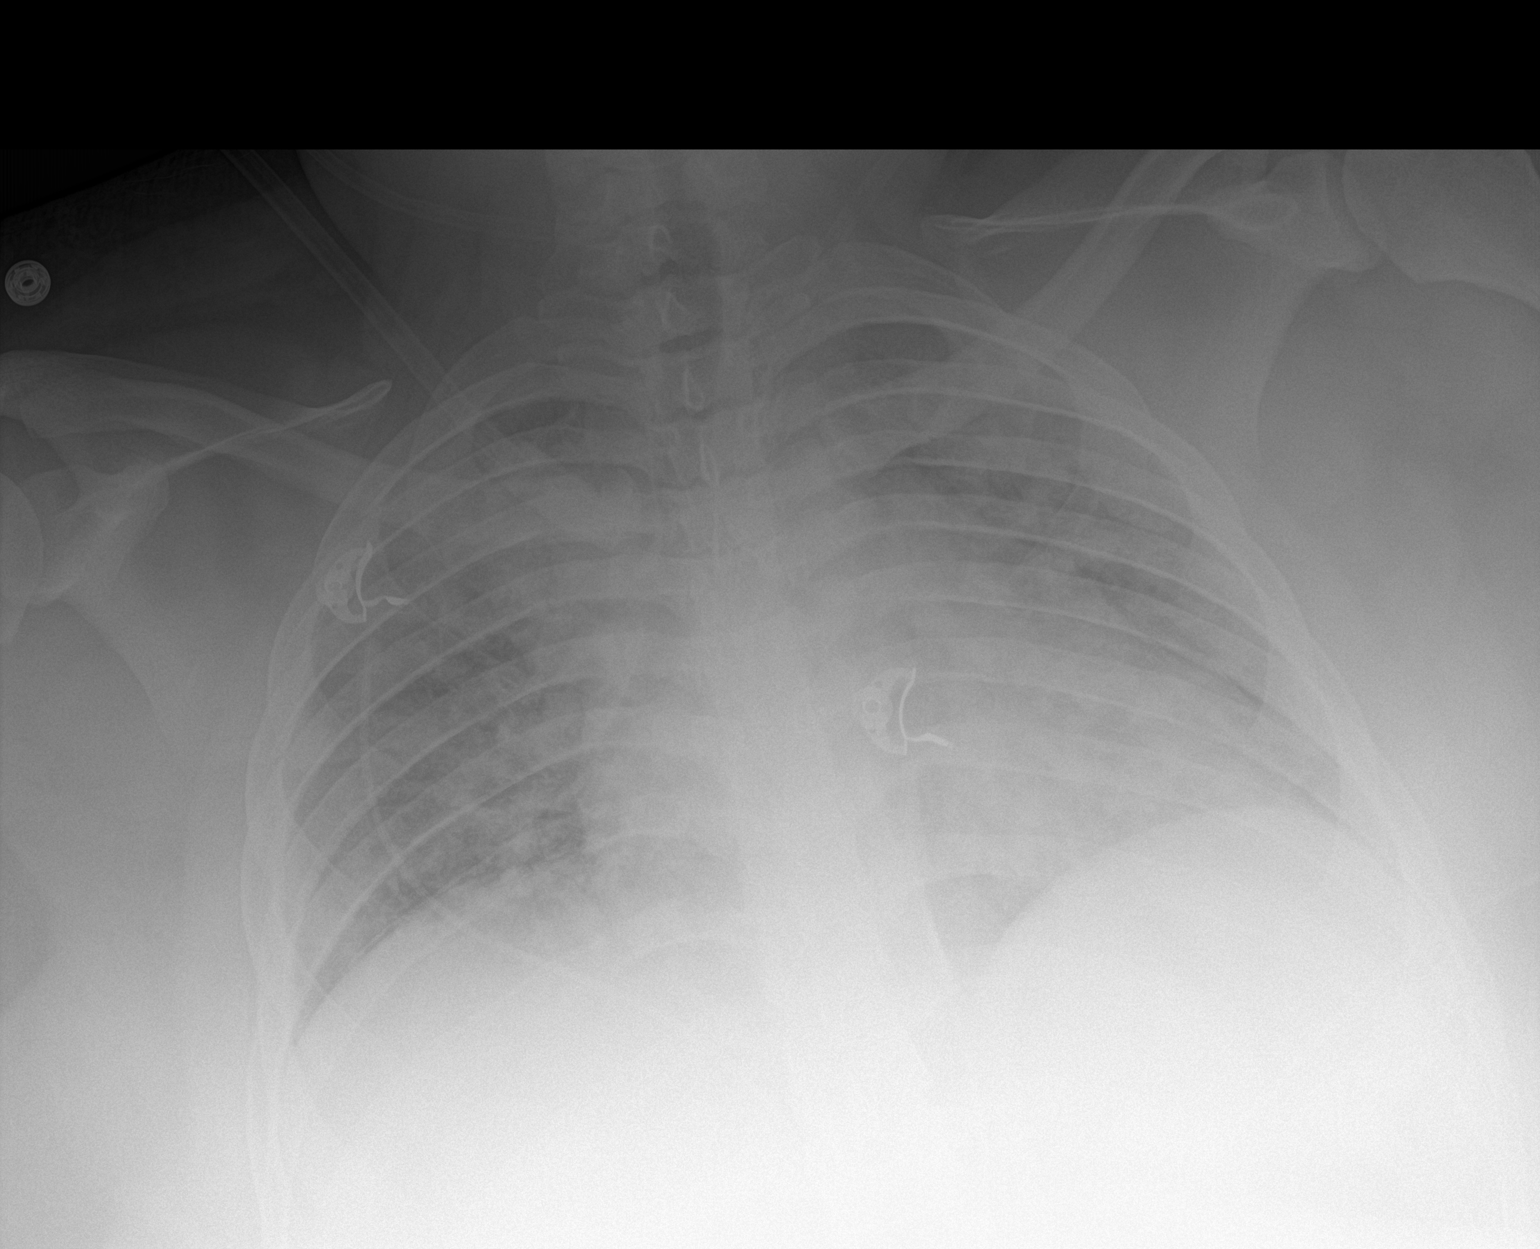

[1 of 1 positions shown; findings below may reference images not displayed]

FINDINGS: Low lung volumes. Development of bilateral heterogeneous opacities
since prior exam. Prominent heart size likely accentuated by low
lung volumes. No pleural fluid or pneumothorax.
IMPRESSION: 1. Low lung volumes. Development of bilateral heterogeneous lung
opacities consistent with MBPCY-IH pneumonia.
2. Prominent heart size likely accentuated by low lung volumes.

## 2021-01-02 IMAGING — DX DG CHEST 1V
1 series · 1 of 1 positions shown · non-contrast
Comparison: 01/29/2019

CLINICAL DATA: Respiratory distress syndrome

EXAM:
CHEST  1 VIEW

[chest pa]
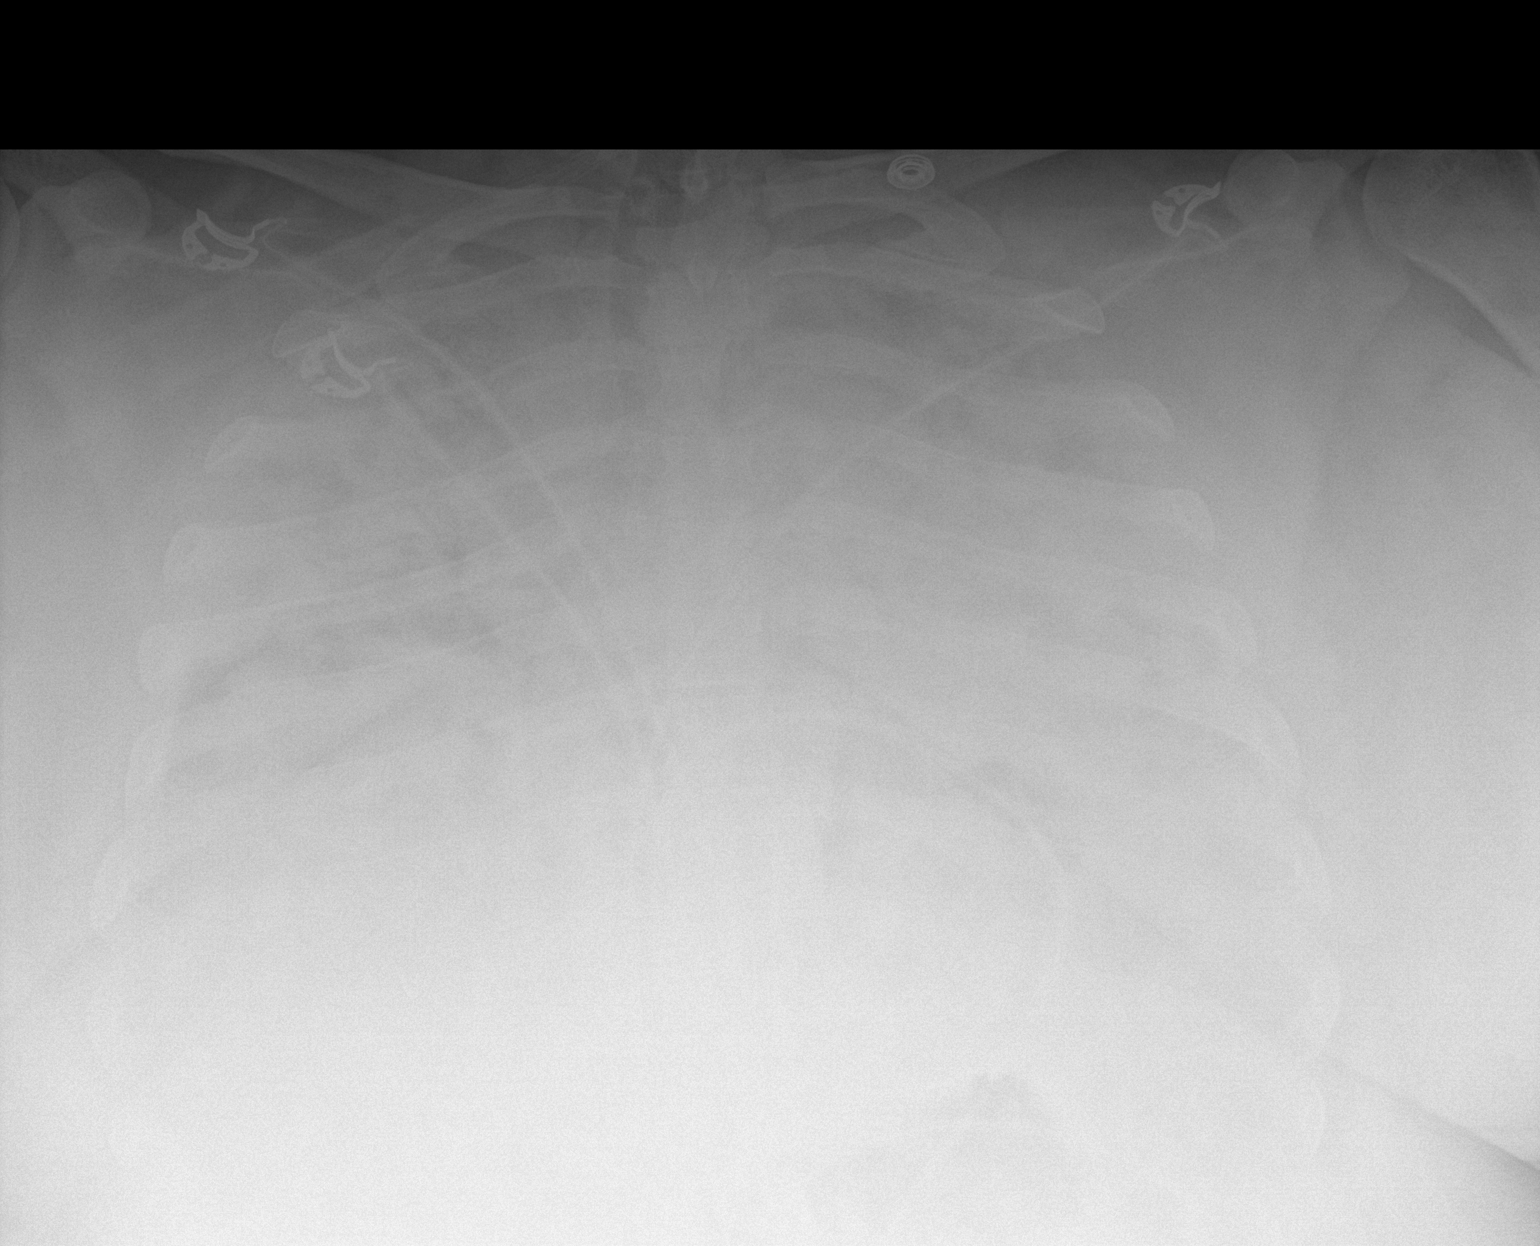

[1 of 1 positions shown; findings below may reference images not displayed]

FINDINGS: Near complete whiteout of the thorax likely representing progression
of bilateral airspace opacities and underpenetration of the study
secondary to patient body habitus. Cardiomediastinal silhouette is
obscured.
IMPRESSION: Near complete whiteout of the thorax, likely representing
progression of bilateral airspace opacities and underpenetration of
the study secondary to patient body habitus.

## 2021-01-03 IMAGING — DX DG ABDOMEN 1V
1 series · 1 of 1 positions shown · non-contrast
Comparison: Prior CT from 11/28/2018.

CLINICAL DATA: Initial evaluation for NG tube placement.

EXAM:
ABDOMEN - 1 VIEW

[abdomen kub]
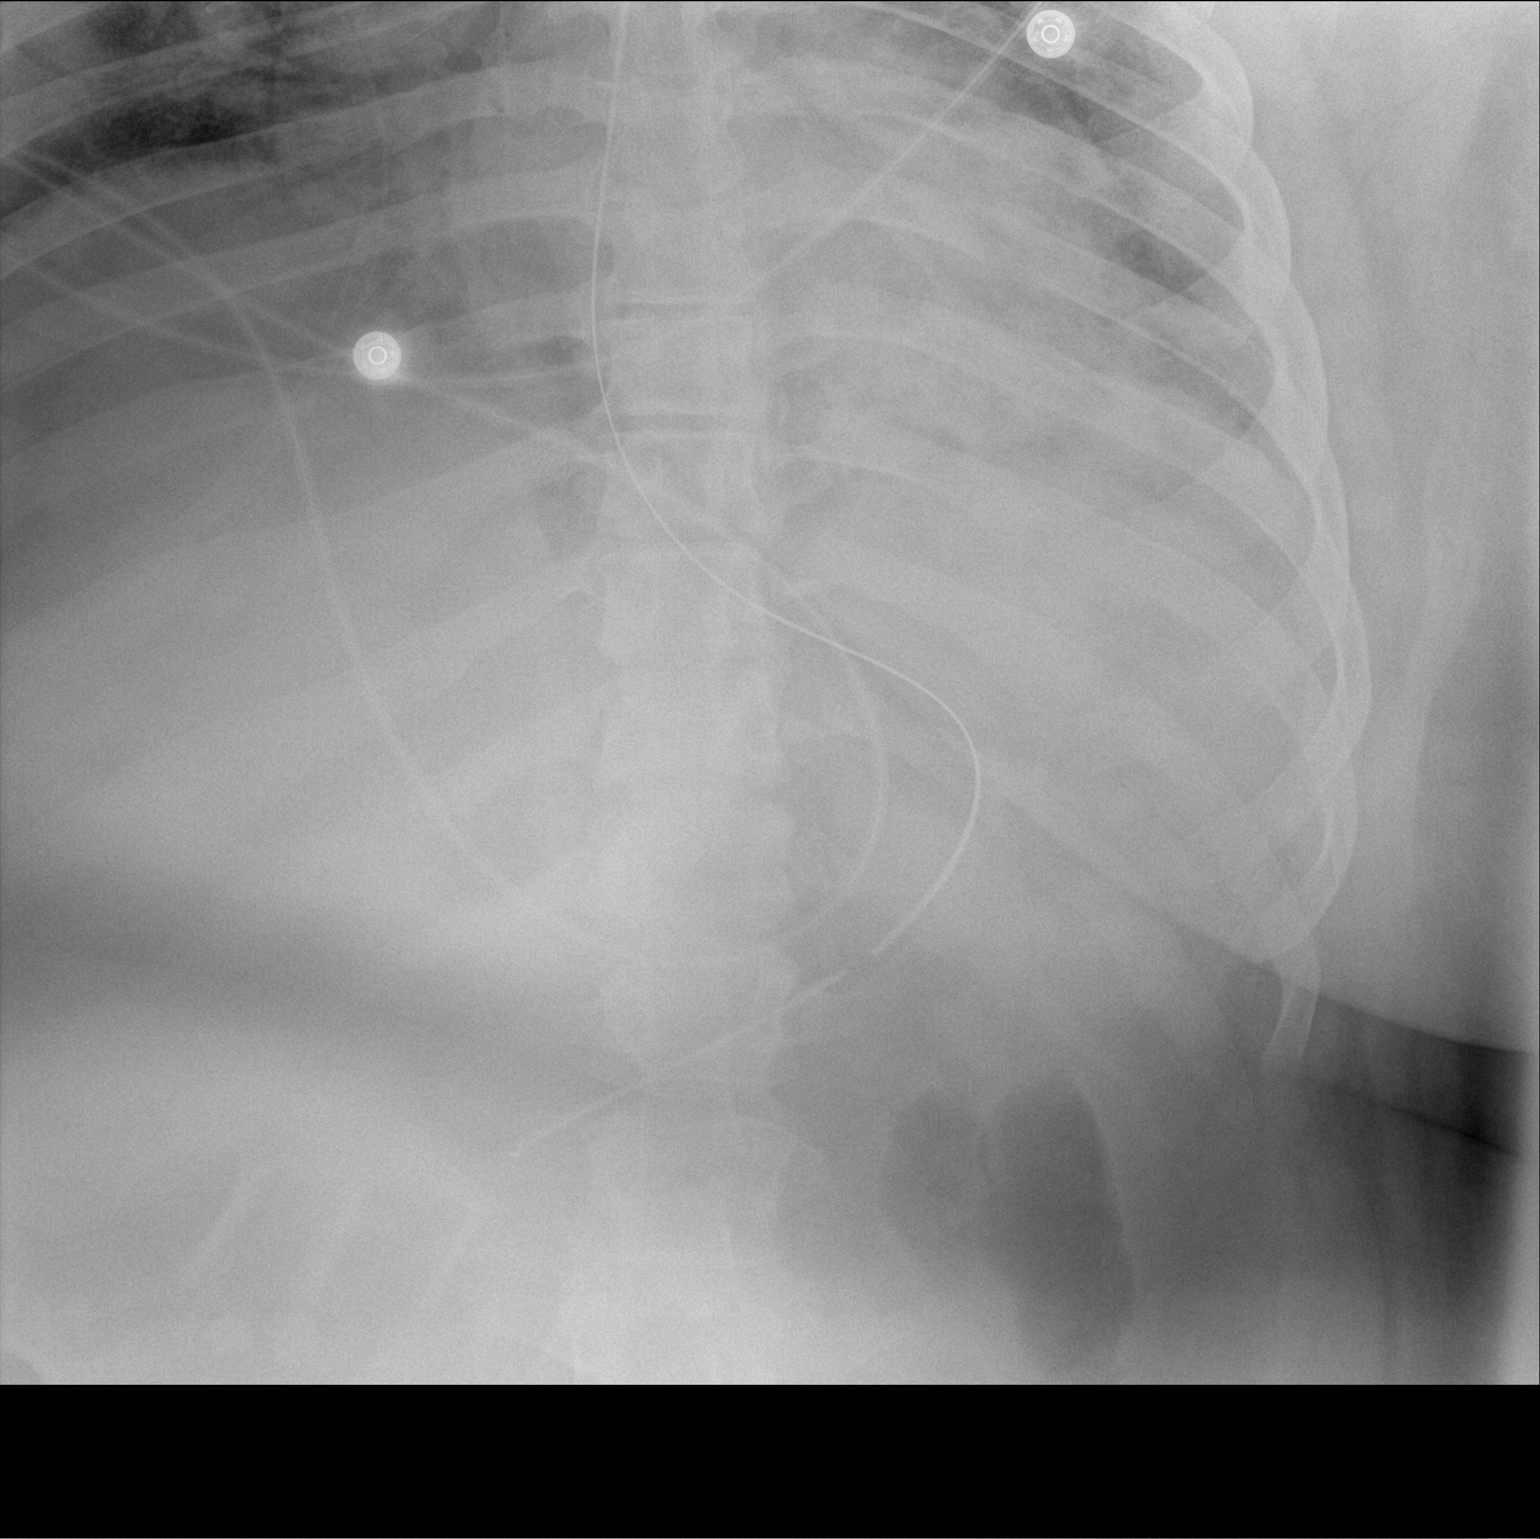

[1 of 1 positions shown; findings below may reference images not displayed]

FINDINGS: Enteric tube in place with tip overlying the distal stomach, side
hole beyond the GE junction.

Visualized bowel gas pattern is nonobstructive.
IMPRESSION: Enteric tube in place with tip overlying the distal stomach. Side
hole well beyond the GE junction.

## 2021-01-09 ENCOUNTER — Emergency Department (HOSPITAL_BASED_OUTPATIENT_CLINIC_OR_DEPARTMENT_OTHER)
Admission: EM | Admit: 2021-01-09 | Discharge: 2021-01-09 | Disposition: A | Discharge status: Home / Self Care | Payer: BC Managed Care – PPO | Attending: Emergency Medicine | Admitting: Emergency Medicine

## 2021-01-09 ENCOUNTER — Encounter (HOSPITAL_BASED_OUTPATIENT_CLINIC_OR_DEPARTMENT_OTHER): Payer: Self-pay | Admitting: *Deleted

## 2021-01-09 ENCOUNTER — Other Ambulatory Visit: Payer: Self-pay

## 2021-01-09 DIAGNOSIS — L02211 Cutaneous abscess of abdominal wall: Secondary | ICD-10-CM | POA: Diagnosis not present

## 2021-01-09 DIAGNOSIS — Z5321 Procedure and treatment not carried out due to patient leaving prior to being seen by health care provider: Secondary | ICD-10-CM | POA: Insufficient documentation

## 2021-01-09 NOTE — ED Notes (Signed)
Signed MSE, verbalizes understanding, states, have to be a work by 1300. "May have to leave"

## 2021-01-09 NOTE — ED Notes (Signed)
Called pt 1x, no response

## 2021-01-09 NOTE — ED Triage Notes (Addendum)
C/o new abscess noted under pannus, midline, some drainage noted, onset 3d ago as an ingrown hair, denies fever, NVD, urinary sx. Some redness and induration noted. Mentions cough x1 week.

## 2021-01-09 NOTE — ED Notes (Signed)
Called pt for 2nd time, no response

## 2021-07-18 ENCOUNTER — Encounter: Payer: Self-pay | Admitting: *Deleted

## 2021-09-28 ENCOUNTER — Emergency Department (HOSPITAL_BASED_OUTPATIENT_CLINIC_OR_DEPARTMENT_OTHER): Payer: Medicaid Other

## 2021-09-28 ENCOUNTER — Other Ambulatory Visit: Payer: Self-pay

## 2021-09-28 ENCOUNTER — Encounter (HOSPITAL_BASED_OUTPATIENT_CLINIC_OR_DEPARTMENT_OTHER): Payer: Self-pay | Admitting: Emergency Medicine

## 2021-09-28 ENCOUNTER — Emergency Department (HOSPITAL_BASED_OUTPATIENT_CLINIC_OR_DEPARTMENT_OTHER)
Admission: EM | Admit: 2021-09-28 | Discharge: 2021-09-29 | Disposition: A | Discharge status: Home / Self Care | Payer: No Typology Code available for payment source | Attending: Emergency Medicine | Admitting: Emergency Medicine

## 2021-09-28 DIAGNOSIS — M545 Low back pain, unspecified: Secondary | ICD-10-CM | POA: Diagnosis not present

## 2021-09-28 DIAGNOSIS — M546 Pain in thoracic spine: Secondary | ICD-10-CM | POA: Diagnosis not present

## 2021-09-28 DIAGNOSIS — E119 Type 2 diabetes mellitus without complications: Secondary | ICD-10-CM | POA: Insufficient documentation

## 2021-09-28 DIAGNOSIS — M542 Cervicalgia: Secondary | ICD-10-CM | POA: Diagnosis present

## 2021-09-28 DIAGNOSIS — Y92410 Unspecified street and highway as the place of occurrence of the external cause: Secondary | ICD-10-CM | POA: Diagnosis not present

## 2021-09-28 LAB — PREGNANCY, URINE: Preg Test, Ur: NEGATIVE

## 2021-09-28 MED ORDER — MELOXICAM 15 MG PO TABS: 15.0000 mg | ORAL_TABLET | Freq: Every day | ORAL | 0 refills | Status: AC

## 2021-09-28 MED ORDER — METHOCARBAMOL 500 MG PO TABS: 500.0000 mg | ORAL_TABLET | Freq: Two times a day (BID) | ORAL | 0 refills | Status: AC
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
## 2021-09-28 NOTE — Discharge Instructions (Addendum)
You were seen today for neck pain and back pain.  No fractures were noted on the imaging performed today.  Please take the anti-inflammatory medicine as prescribed.  The muscle relaxant is provided to be used at night prior to sleep.  Please follow-up with your primary care provider as needed.  Work note is attached
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
## 2021-09-29 MED ORDER — IBUPROFEN 800 MG PO TABS
800.0000 mg | ORAL_TABLET | Freq: Once | ORAL | Status: AC
  Administered 2021-09-29: 800 mg via ORAL
  Filled 2021-09-29: qty 1

## 2021-11-09 ENCOUNTER — Telehealth: Payer: Self-pay | Admitting: Physician Assistant

## 2021-11-09 DIAGNOSIS — J069 Acute upper respiratory infection, unspecified: Secondary | ICD-10-CM

## 2021-11-09 MED ORDER — BENZONATATE 100 MG PO CAPS: 100.0000 mg | ORAL_CAPSULE | Freq: Three times a day (TID) | ORAL | 0 refills | Status: AC | PRN

## 2021-11-09 NOTE — Progress Notes (Signed)

## 2021-11-09 NOTE — Progress Notes (Signed)
I have spent 5 minutes in review of e-visit questionnaire, review and updating patient chart, medical decision making and response to patient.   Shamonique Battiste Cody Lillien Petronio, PA-C    

## 2021-12-16 ENCOUNTER — Telehealth: Payer: Self-pay | Admitting: Nurse Practitioner

## 2021-12-16 DIAGNOSIS — J029 Acute pharyngitis, unspecified: Secondary | ICD-10-CM

## 2021-12-16 MED ORDER — AMOXICILLIN 500 MG PO TABS: 500.0000 mg | ORAL_TABLET | Freq: Two times a day (BID) | ORAL | 0 refills | Status: DC

## 2021-12-16 NOTE — Progress Notes (Signed)
E-Visit for Sore Throat - Strep Symptoms  We are sorry that you are not feeling well.  Here is how we plan to help!  Based on what you have shared with me it is likely that you have strep pharyngitis.  Strep pharyngitis is inflammation and infection in the back of the throat.  This is an infection cause by bacteria and is treated with antibiotics.  I have prescribed Amoxicillin 500 mg twice a day for 10 days. For throat pain, we recommend over the counter oral pain relief medications such as acetaminophen or aspirin, or anti-inflammatory medications such as ibuprofen or naproxen sodium. Topical treatments such as oral throat lozenges or sprays may be used as needed. Strep infections are not as easily transmitted as other respiratory infections, however we still recommend that you avoid close contact with loved ones, especially the very young and elderly.  Remember to wash your hands thoroughly throughout the day as this is the number one way to prevent the spread of infection and wipe down door knobs and counters with disinfectant.   Home Care: Only take medications as instructed by your medical team. Complete the entire course of an antibiotic. Do not take these medications with alcohol. A steam or ultrasonic humidifier can help congestion.  You can place a towel over your head and breathe in the steam from hot water coming from a faucet. Avoid close contacts especially the very young and the elderly. Cover your mouth when you cough or sneeze. Always remember to wash your hands.  Get Help Right Away If: You develop worsening fever or sinus pain. You develop a severe head ache or visual changes. Your symptoms persist after you have completed your treatment plan.  Make sure you Understand these instructions. Will watch your condition. Will get help right away if you are not doing well or get worse.   Thank you for choosing an e-visit.  Your e-visit answers were reviewed by a board  certified advanced clinical practitioner to complete your personal care plan. Depending upon the condition, your plan could have included both over the counter or prescription medications.  Please review your pharmacy choice. Make sure the pharmacy is open so you can pick up prescription now. If there is a problem, you may contact your provider through [employer] and have the prescription routed to another pharmacy.  Your safety is important to [place]. If you have drug allergies check your prescription carefully.   For the next 24 hours you can use MyChart to ask questions about today's visit, request a non-urgent call back, or ask for a work or school excuse. You will get an email in the next two days asking about your experience. I hope that your e-visit has been valuable and will speed your recovery.  Anita Hunter Done, FNP   5-10 minutes spent reviewing and documenting in chart.

## 2021-12-19 ENCOUNTER — Emergency Department (HOSPITAL_BASED_OUTPATIENT_CLINIC_OR_DEPARTMENT_OTHER)
Admission: EM | Admit: 2021-12-19 | Discharge: 2021-12-19 | Disposition: A | Discharge status: Home / Self Care | Payer: Medicaid Other | Attending: Emergency Medicine | Admitting: Emergency Medicine

## 2021-12-19 ENCOUNTER — Encounter (HOSPITAL_BASED_OUTPATIENT_CLINIC_OR_DEPARTMENT_OTHER): Payer: Self-pay

## 2021-12-19 ENCOUNTER — Other Ambulatory Visit (HOSPITAL_BASED_OUTPATIENT_CLINIC_OR_DEPARTMENT_OTHER): Payer: Self-pay

## 2021-12-19 DIAGNOSIS — H66002 Acute suppurative otitis media without spontaneous rupture of ear drum, left ear: Secondary | ICD-10-CM

## 2021-12-19 DIAGNOSIS — Z1152 Encounter for screening for COVID-19: Secondary | ICD-10-CM | POA: Insufficient documentation

## 2021-12-19 DIAGNOSIS — H1033 Unspecified acute conjunctivitis, bilateral: Secondary | ICD-10-CM

## 2021-12-19 LAB — RESP PANEL BY RT-PCR (FLU A&B, COVID) ARPGX2
Influenza A by PCR: NEGATIVE
Influenza B by PCR: NEGATIVE
SARS Coronavirus 2 by RT PCR: NEGATIVE

## 2021-12-19 MED ORDER — AMOXICILLIN-POT CLAVULANATE 875-125 MG PO TABS
1.0000 | ORAL_TABLET | Freq: Two times a day (BID) | ORAL | 0 refills | Status: AC
  Filled 2021-12-19: qty 14, 7d supply, fill #0

## 2021-12-19 MED ORDER — METHYLPREDNISOLONE 4 MG PO TBPK
ORAL_TABLET | ORAL | 0 refills | Status: DC
  Filled 2021-12-19: qty 21, 6d supply, fill #0

## 2021-12-19 MED ORDER — POLYMYXIN B-TRIMETHOPRIM 10000-0.1 UNIT/ML-% OP SOLN
1.0000 [drp] | OPHTHALMIC | 0 refills | Status: AC
  Filled 2021-12-19: qty 10, 7d supply, fill #0

## 2021-12-19 NOTE — ED Provider Notes (Signed)
[facility] Provider Note   CSN: [id number] Arrival date & time: 12/19/21  0745     History  Chief Complaint  Patient presents with   URI    AVONELL LENIG is a 31 y.o. female who presents emergency department with chief complaint of URI symptoms.  She complains of left ear fullness, pressure and eustachian tube dysfunction as well as sore throat, nasal congestion and now bilateral conjunctival watering and erythema.  She was seen in an ED visit over the weekend started amoxicillin for presumed strep throat.  She states that this improved her symptoms to some degree in her right ear improved however her left ear has gotten worse.  She has had some redness of her eyes.  She denies any itching but has had excessive watering and occasional mattering of her lashes.  No fevers or chills.   URI      Home Medications Prior to Admission medications   Medication Sig Start Date End Date Taking? Authorizing Provider  amoxicillin-clavulanate (AUGMENTIN) 875-125 MG tablet Take 1 tablet by mouth every 12 (twelve) hours. 12/19/21  Yes Emylia Latella, PA-C  methylPREDNISolone (MEDROL DOSEPAK) 4 MG TBPK tablet Use as directed in package. 12/19/21  Yes Abhijot Straughter, PA-C  trimethoprim-polymyxin b (POLYTRIM) ophthalmic solution Place 1 drop into the right eye every 4 (four) hours for 5-7 days. 12/19/21  Yes Tamario Heal, PA-C  acetaminophen (TYLENOL) 325 MG tablet Take 2 tablets (650 mg total) by mouth every 6 (six) hours as needed for headache, mild pain or fever (fever > or = 101). 03/03/19   Angiulli, Lavon Paganini, PA-C  benzonatate (TESSALON) 100 MG capsule Take 1 capsule (100 mg total) by mouth 3 (three) times daily as needed for cough. 11/09/21   Brunetta Jeans, PA-C  fexofenadine (ALLEGRA) 30 MG/5ML suspension Take 5 mLs (30 mg total) by mouth daily. 06/23/20   Richarda Osmond, MD  fluticasone (FLONASE) 50 MCG/ACT nasal spray Place 2 sprays into both nostrils  daily. 06/23/20   Richarda Osmond, MD  methocarbamol (ROBAXIN) 500 MG tablet Take 1 tablet (500 mg total) by mouth 2 (two) times daily. 09/28/21   Dorothyann Peng, PA-C  triamcinolone ointment (KENALOG) 0.5 % Apply 1 application topically 2 (two) times daily. 05/26/19   [name], MD      Allergies    Tramadol and Shellfish allergy    Review of Systems   Review of Systems  Physical Exam Updated Vital Signs BP (!) 154/114 (BP Location: Left Wrist)   Pulse 89   Temp 98.8 F (37.1 C) (Oral)   Resp 18   Ht 5' 9"  (1.753 m)   Wt (!) 168.3 kg   SpO2 98%   BMI 54.79 kg/m  Physical Exam Physical Exam  Nursing note and vitals reviewed. Constitutional: She is oriented to person, place, and time. She appears well-developed and well-nourished. No distress.  HENT:  Head: Normocephalic and atraumatic.  Eyes: Conjunctivae injected and EOM are normal. Pupils are equal, round, and reactive to light. No scleral icterus.  Mouth: Bilateral tonsillar hypertrophy, erythema, mild exudates Ears: Left ear with bulging, purulent TM, right ear normal, no mastoid tenderness bilaterally Neck: Normal range of motion.  Cardiovascular: Normal rate, regular rhythm and normal heart sounds.  Exam reveals no gallop and no friction rub.   No murmur heard. Pulmonary/Chest: Effort normal and breath sounds normal. No respiratory distress.  Abdominal: Soft. Bowel sounds are normal. She exhibits no distension and no mass.  There is no tenderness. There is no guarding.  Neurological: She is alert and oriented to person, place, and time.  Skin: Skin is warm and dry. She is not diaphoretic.   ED Results / Procedures / Treatments   Labs (all labs ordered are listed, but only abnormal results are displayed) Labs Reviewed  RESP PANEL BY RT-PCR (FLU A&B, COVID) ARPGX2    EKG None  Radiology No results found.  Procedures Procedures    Medications Ordered in ED Medications - No data to display  ED  Course/ Medical Decision Making/ A&P                           Medical Decision Making  Patient here with URI symptoms.  We will increase her antibiotic from standard amoxicillin to Augmentin given the superadded of infection of her left ear.  We will add Medrol as this may help improve swelling in the structures of the face.  We will also add Polytrim eyedrops.  Appears otherwise appropriate for discharge at this time        Final Clinical Impression(s) / ED Diagnoses Final diagnoses:  Acute suppurative otitis media of left ear without spontaneous rupture of tympanic membrane, recurrence not specified  Acute conjunctivitis of both eyes, unspecified acute conjunctivitis type    Rx / DC Orders ED Discharge Orders          Ordered    methylPREDNISolone (MEDROL DOSEPAK) 4 MG TBPK tablet        12/19/21 0945    amoxicillin-clavulanate (AUGMENTIN) 875-125 MG tablet  Every 12 hours        12/19/21 0945    trimethoprim-polymyxin b (POLYTRIM) ophthalmic solution  Every 4 hours        12/19/21 0945              Margarita Mail, PA-C 12/19/21 [place], [place], MD 12/19/21 1458

## 2021-12-19 NOTE — ED Triage Notes (Signed)
C/o sore throat, bilateral ear pain, congestion, itchy/runny eyes, cough since Thursday. Started on amoxicillin via televisit for possible strep on Saturday. Pain has improved with abx.

## 2021-12-19 NOTE — ED Notes (Signed)
Discharge instructions reviewed with patient. Patient verbalizes understanding, no further questions at this time. Medications/prescriptions and follow up information provided. No acute distress noted at time of departure.  

## 2021-12-19 NOTE — Discharge Instructions (Addendum)
Get help right away if: You have severe pain that is not controlled with medicine. You have swelling, redness, or pain around your ear. You have stiffness in your neck. A part of your face is not moving (paralyzed). The bone behind your ear (mastoid bone) is tender when you touch it. You develop a severe headache.

## 2021-12-27 ENCOUNTER — Other Ambulatory Visit (HOSPITAL_BASED_OUTPATIENT_CLINIC_OR_DEPARTMENT_OTHER): Payer: Self-pay

## 2022-02-09 ENCOUNTER — Other Ambulatory Visit: Payer: Self-pay

## 2022-02-09 ENCOUNTER — Emergency Department (HOSPITAL_BASED_OUTPATIENT_CLINIC_OR_DEPARTMENT_OTHER)
Admission: EM | Admit: 2022-02-09 | Discharge: 2022-02-09 | Disposition: A | Discharge status: Home / Self Care | Payer: Medicaid Other | Attending: Emergency Medicine | Admitting: Emergency Medicine

## 2022-02-09 ENCOUNTER — Encounter (HOSPITAL_BASED_OUTPATIENT_CLINIC_OR_DEPARTMENT_OTHER): Payer: Self-pay | Admitting: Pediatrics

## 2022-02-09 DIAGNOSIS — Z20822 Contact with and (suspected) exposure to covid-19: Secondary | ICD-10-CM | POA: Insufficient documentation

## 2022-02-09 DIAGNOSIS — J029 Acute pharyngitis, unspecified: Secondary | ICD-10-CM | POA: Diagnosis present

## 2022-02-09 LAB — GROUP A STREP BY PCR: Group A Strep by PCR: NOT DETECTED

## 2022-02-09 LAB — RESP PANEL BY RT-PCR (RSV, FLU A&B, COVID)  RVPGX2
Influenza A by PCR: NEGATIVE
Influenza B by PCR: NEGATIVE
Resp Syncytial Virus by PCR: NEGATIVE
SARS Coronavirus 2 by RT PCR: NEGATIVE

## 2022-02-09 MED ORDER — DEXAMETHASONE 10 MG/ML FOR PEDIATRIC ORAL USE
10.0000 mg | Freq: Once | INTRAMUSCULAR | Status: AC
Start: 2022-02-09 — End: 2022-02-09
  Administered 2022-02-09: 10 mg via ORAL
  Filled 2022-02-09: qty 1

## 2022-02-09 MED ORDER — IBUPROFEN 400 MG PO TABS
600.0000 mg | ORAL_TABLET | Freq: Once | ORAL | Status: AC
  Administered 2022-02-09: 600 mg via ORAL
  Filled 2022-02-09: qty 1

## 2022-02-09 MED ORDER — DEXAMETHASONE 1 MG/ML PO CONC: 10.0000 mg | Freq: Once | ORAL | Status: DC

## 2022-02-09 NOTE — ED Triage Notes (Signed)
C/o sore throat and nasal congestion since Tuesday.

## 2022-02-09 NOTE — Discharge Instructions (Addendum)
You were seen in the emergency department for your sore throat.  You tested negative for strep, COVID and flu.  This is likely caused by another viral infection.  We gave you a dose of steroids that should help with the swelling in your throat and you can continue to take Tylenol and Motrin as needed for pain.  Both can be taken up to every 6 hours.  You should follow-up with your primary doctor in the next couple days to have your symptoms rechecked.  You should return to the emergency department if your pains getting significantly worse, you are having difficulty opening your mouth, you cannot swallow your own saliva or if you have any other new or concerning symptoms.

## 2022-02-09 NOTE — ED Notes (Signed)
Reviewed discharge instructions and follow up. Pt states understanding. Ambulatory at discharge

## 2022-02-09 NOTE — ED Provider Notes (Signed)
[facility] Provider Note   CSN: [id number] Arrival date & time: 02/09/22  2426     History  Chief Complaint  Patient presents with   Sore Throat    Anita Hunter is a 31 y.o. female.  Patient is a 31 year old female with no significant past medical history presenting to the emergency department with sore throat.  The patient states that she has had a sore throat for the past 2 to 3 days.  She denies any fever or cough.  She denies any nausea, vomiting or diarrhea.  She states that she has been using throat spray without significant relief.  She denies any known sick contacts.  The history is provided by the patient.  Sore Throat       Home Medications Prior to Admission medications   Medication Sig Start Date End Date Taking? Authorizing Provider  acetaminophen (TYLENOL) 325 MG tablet Take 2 tablets (650 mg total) by mouth every 6 (six) hours as needed for headache, mild pain or fever (fever > or = 101). 03/03/19   Angiulli, Lavon Paganini, PA-C  amoxicillin-clavulanate (AUGMENTIN) 875-125 MG tablet Take 1 tablet by mouth every 12 (twelve) hours. 12/19/21   Harris, Abigail, PA-C  benzonatate (TESSALON) 100 MG capsule Take 1 capsule (100 mg total) by mouth 3 (three) times daily as needed for cough. 11/09/21   Brunetta Jeans, PA-C  fexofenadine (ALLEGRA) 30 MG/5ML suspension Take 5 mLs (30 mg total) by mouth daily. 06/23/20   Richarda Osmond, MD  fluticasone (FLONASE) 50 MCG/ACT nasal spray Place 2 sprays into both nostrils daily. 06/23/20   Richarda Osmond, MD  methocarbamol (ROBAXIN) 500 MG tablet Take 1 tablet (500 mg total) by mouth 2 (two) times daily. 09/28/21   Dorothyann Peng, PA-C  methylPREDNISolone (MEDROL DOSEPAK) 4 MG TBPK tablet Use as directed in package. 12/19/21   Margarita Mail, PA-C  triamcinolone ointment (KENALOG) 0.5 % Apply 1 application topically 2 (two) times daily. 05/26/19   [name], MD  trimethoprim-polymyxin  b Mayra Neer) ophthalmic solution Place 1 drop into the right eye every 4 (four) hours for 5-7 days. 12/19/21   Margarita Mail, PA-C      Allergies    Tramadol and Shellfish allergy    Review of Systems   Review of Systems  Physical Exam Updated Vital Signs BP (!) 146/93   Pulse 96   Temp 98.1 F (36.7 C)   Resp 18   Ht 5' 9"  (1.753 m)   Wt (!) 169.6 kg   SpO2 96%   BMI 55.23 kg/m  Physical Exam Vitals and nursing note reviewed.  Constitutional:      General: She is not in acute distress.    Appearance: She is well-developed.  HENT:     Head: Normocephalic and atraumatic.     Mouth/Throat:     Mouth: Mucous membranes are moist.     Pharynx: Uvula midline.     Tonsils: Tonsillar exudate present. 1+ on the right. 1+ on the left.     Comments: Normal phonation, no trismus Eyes:     Conjunctiva/sclera: Conjunctivae normal.  Cardiovascular:     Rate and Rhythm: Regular rhythm. Tachycardia present.     Heart sounds: Normal heart sounds.  Pulmonary:     Effort: Pulmonary effort is normal.     Breath sounds: Normal breath sounds.  Abdominal:     General: There is no distension.  Musculoskeletal:     Cervical back: Normal range  of motion and neck supple.  Lymphadenopathy:     Cervical: Cervical adenopathy (L-sided) present.  Skin:    General: Skin is warm and dry.  Neurological:     General: No focal deficit present.     Mental Status: She is alert and oriented to person, place, and time.  Psychiatric:        Mood and Affect: Mood normal.        Behavior: Behavior normal.     ED Results / Procedures / Treatments   Labs (all labs ordered are listed, but only abnormal results are displayed) Labs Reviewed  GROUP A STREP BY PCR  RESP PANEL BY RT-PCR (RSV, FLU A&B, COVID)  [id number]    EKG None  Radiology No results found.  Procedures Procedures    Medications Ordered in ED Medications  ibuprofen (ADVIL) tablet 600 mg (600 mg Oral Given 02/09/22 0908)   dexamethasone (DECADRON) 10 MG/ML injection for Pediatric ORAL use 10 mg (10 mg Oral Given 02/09/22 0908)    ED Course/ Medical Decision Making/ A&P                           Medical Decision Making This patient presents to the ED with chief complaint(s) of sore throat with no pertinent past medical history which further complicates the presenting complaint. The complaint involves an extensive differential diagnosis and also carries with it a high risk of complications and morbidity.    The differential diagnosis includes viral syndrome, strep throat, no significant tonsillar swelling with midline uvula and normal phonation making PTA or RPA unlikely, no evidence of Ludwick's  Additional history obtained: Additional history obtained from N/A Records reviewed N/A  ED Course and Reassessment: Patient was initially evaluated in triage and had viral swab and strep swab performed.  She does have tonsillar swelling with exudates and will be given Motrin and dexamethasone for symptomatic control.  Independent labs interpretation:  The following labs were independently interpreted: Negative strep and viral swab  Independent visualization of imaging: N/A  Consultation: - Consulted or discussed management/test interpretation w/ external professional: N/A  Consideration for admission or further workup: Patient has no emergent conditions requiring admission or further work-up at this time and is stable for discharge home with primary care follow-up  Social Determinants of health: N/A            Final Clinical Impression(s) / ED Diagnoses Final diagnoses:  Viral pharyngitis    Rx / DC Orders ED Discharge Orders     None         Kemper Durie, DO 02/09/22 1696

## 2022-02-16 ENCOUNTER — Emergency Department (HOSPITAL_BASED_OUTPATIENT_CLINIC_OR_DEPARTMENT_OTHER): Payer: Medicaid Other

## 2022-02-16 ENCOUNTER — Other Ambulatory Visit: Payer: Self-pay

## 2022-02-16 ENCOUNTER — Encounter (HOSPITAL_BASED_OUTPATIENT_CLINIC_OR_DEPARTMENT_OTHER): Payer: Self-pay | Admitting: Emergency Medicine

## 2022-02-16 ENCOUNTER — Emergency Department (HOSPITAL_BASED_OUTPATIENT_CLINIC_OR_DEPARTMENT_OTHER)
Admission: EM | Admit: 2022-02-16 | Discharge: 2022-02-16 | Disposition: A | Discharge status: Home / Self Care | Payer: Medicaid Other | Attending: Emergency Medicine | Admitting: Emergency Medicine

## 2022-02-16 ENCOUNTER — Other Ambulatory Visit (HOSPITAL_BASED_OUTPATIENT_CLINIC_OR_DEPARTMENT_OTHER): Payer: Self-pay

## 2022-02-16 DIAGNOSIS — Z1152 Encounter for screening for COVID-19: Secondary | ICD-10-CM | POA: Diagnosis not present

## 2022-02-16 DIAGNOSIS — R000 Tachycardia, unspecified: Secondary | ICD-10-CM | POA: Insufficient documentation

## 2022-02-16 DIAGNOSIS — E119 Type 2 diabetes mellitus without complications: Secondary | ICD-10-CM | POA: Insufficient documentation

## 2022-02-16 DIAGNOSIS — M79602 Pain in left arm: Secondary | ICD-10-CM | POA: Diagnosis present

## 2022-02-16 DIAGNOSIS — G629 Polyneuropathy, unspecified: Secondary | ICD-10-CM | POA: Insufficient documentation

## 2022-02-16 LAB — CBC WITH DIFFERENTIAL/PLATELET
Abs Immature Granulocytes: 0.15 10*3/uL — ABNORMAL HIGH (ref 0.00–0.07)
Basophils Absolute: 0.1 10*3/uL (ref 0.0–0.1)
Basophils Relative: 0 %
Eosinophils Absolute: 0.6 10*3/uL — ABNORMAL HIGH (ref 0.0–0.5)
Eosinophils Relative: 3 %
HCT: 45.2 % (ref 36.0–46.0)
Hemoglobin: 14.2 g/dL (ref 12.0–15.0)
Immature Granulocytes: 1 %
Lymphocytes Relative: 4 %
Lymphs Abs: 0.9 10*3/uL (ref 0.7–4.0)
MCH: 26.9 pg (ref 26.0–34.0)
MCHC: 31.4 g/dL (ref 30.0–36.0)
MCV: 85.6 fL (ref 80.0–100.0)
Monocytes Absolute: 0.5 10*3/uL (ref 0.1–1.0)
Monocytes Relative: 3 %
Neutro Abs: 17.7 10*3/uL — ABNORMAL HIGH (ref 1.7–7.7)
Neutrophils Relative %: 89 %
Platelets: 257 10*3/uL (ref 150–400)
RBC: 5.28 MIL/uL — ABNORMAL HIGH (ref 3.87–5.11)
RDW: 13.3 % (ref 11.5–15.5)
WBC: 19.9 10*3/uL — ABNORMAL HIGH (ref 4.0–10.5)
nRBC: 0 % (ref 0.0–0.2)

## 2022-02-16 LAB — BASIC METABOLIC PANEL
Anion gap: 8 (ref 5–15)
BUN: 10 mg/dL (ref 6–20)
CO2: 25 mmol/L (ref 22–32)
Calcium: 8 mg/dL — ABNORMAL LOW (ref 8.9–10.3)
Chloride: 103 mmol/L (ref 98–111)
Creatinine, Ser: 0.9 mg/dL (ref 0.44–1.00)
GFR, Estimated: >60 mL/min (ref >60)
Glucose, Bld: 154 mg/dL — ABNORMAL HIGH (ref 70–99)
Potassium: 4.1 mmol/L (ref 3.5–5.1)
Sodium: 136 mmol/L (ref 135–145)

## 2022-02-16 LAB — RESP PANEL BY RT-PCR (RSV, FLU A&B, COVID)  RVPGX2
Influenza A by PCR: NEGATIVE
Influenza B by PCR: NEGATIVE
Resp Syncytial Virus by PCR: NEGATIVE
SARS Coronavirus 2 by RT PCR: NEGATIVE

## 2022-02-16 LAB — HCG, SERUM, QUALITATIVE: Preg, Serum: NEGATIVE

## 2022-02-16 MED ORDER — SODIUM CHLORIDE 0.9 % IV BOLUS
1000.0000 mL | Freq: Once | INTRAVENOUS | Status: AC
  Administered 2022-02-16: 1000 mL via INTRAVENOUS

## 2022-02-16 MED ORDER — ACETAMINOPHEN 325 MG PO TABS
650.0000 mg | ORAL_TABLET | Freq: Once | ORAL | Status: AC
  Administered 2022-02-16: 650 mg via ORAL
  Filled 2022-02-16: qty 2

## 2022-02-16 MED ORDER — KETOROLAC TROMETHAMINE 60 MG/2ML IM SOLN
60.0000 mg | Freq: Once | INTRAMUSCULAR | Status: AC
  Administered 2022-02-16: 60 mg via INTRAMUSCULAR
  Filled 2022-02-16: qty 2

## 2022-02-16 MED ORDER — DIAZEPAM 5 MG PO TABS
5.0000 mg | ORAL_TABLET | Freq: Once | ORAL | Status: AC
  Administered 2022-02-16: 5 mg via ORAL
  Filled 2022-02-16: qty 1

## 2022-02-16 MED ORDER — OXYCODONE HCL 5 MG PO TABS
5.0000 mg | ORAL_TABLET | Freq: Four times a day (QID) | ORAL | 0 refills | Status: AC | PRN
  Filled 2022-02-16: qty 10, 3d supply, fill #0

## 2022-02-16 MED ORDER — METHYLPREDNISOLONE 4 MG PO TBPK
ORAL_TABLET | ORAL | 0 refills | Status: AC
  Filled 2022-02-16: qty 21, 6d supply, fill #0

## 2022-02-16 MED ORDER — LIDOCAINE 5 % EX PTCH
1.0000 | MEDICATED_PATCH | Freq: Once | CUTANEOUS | Status: DC
  Administered 2022-02-16: 1 via TRANSDERMAL
  Filled 2022-02-16: qty 1

## 2022-02-16 MED ORDER — OXYCODONE-ACETAMINOPHEN 5-325 MG PO TABS
1.0000 | ORAL_TABLET | Freq: Once | ORAL | Status: AC
  Administered 2022-02-16: 1 via ORAL
  Filled 2022-02-16: qty 1
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
## 2022-02-16 NOTE — ED Triage Notes (Signed)
Pt arrives pov, steady gait, c/o bilateral upper and lower extremity weakness, also reports extremity pain.  Speech clear

## 2022-02-16 NOTE — Discharge Instructions (Signed)
It was a pleasure caring for you today in the emergency department.  Please return to the emergency department for any worsening or worrisome symptoms.  Please follow up with neurology

## 2022-02-16 NOTE — ED Notes (Signed)
2 attempts for blood draw

## 2022-02-16 NOTE — ED Provider Notes (Signed)
Patient feeling better.  Given narcotic medicine for breakthrough pain for chronic pain as well as Medrol Dosepak.  She states that steroids have helped in the past.  Discharged in good condition.   Lennice Sites, DO 02/16/22 1626

## 2024-02-22 ENCOUNTER — Emergency Department (HOSPITAL_BASED_OUTPATIENT_CLINIC_OR_DEPARTMENT_OTHER)
Admission: EM | Admit: 2024-02-22 | Discharge: 2024-02-22 | Disposition: A | Discharge status: Home / Self Care | Attending: Emergency Medicine | Admitting: Emergency Medicine

## 2024-02-22 ENCOUNTER — Encounter (HOSPITAL_BASED_OUTPATIENT_CLINIC_OR_DEPARTMENT_OTHER): Payer: Self-pay

## 2024-02-22 DIAGNOSIS — D72829 Elevated white blood cell count, unspecified: Secondary | ICD-10-CM | POA: Diagnosis not present

## 2024-02-22 DIAGNOSIS — I509 Heart failure, unspecified: Secondary | ICD-10-CM | POA: Diagnosis not present

## 2024-02-22 DIAGNOSIS — R112 Nausea with vomiting, unspecified: Secondary | ICD-10-CM | POA: Insufficient documentation

## 2024-02-22 DIAGNOSIS — E119 Type 2 diabetes mellitus without complications: Secondary | ICD-10-CM | POA: Insufficient documentation

## 2024-02-22 LAB — CBC WITH DIFFERENTIAL/PLATELET
Abs Immature Granulocytes: 0.05 K/uL (ref 0.00–0.07)
Basophils Absolute: 0 K/uL (ref 0.0–0.1)
Basophils Relative: 0 %
Eosinophils Absolute: 0.2 K/uL (ref 0.0–0.5)
Eosinophils Relative: 2 %
HCT: 41.9 % (ref 36.0–46.0)
Hemoglobin: 13 g/dL (ref 12.0–15.0)
Immature Granulocytes: 0 %
Lymphocytes Relative: 9 %
Lymphs Abs: 1.3 K/uL (ref 0.7–4.0)
MCH: 26.4 pg (ref 26.0–34.0)
MCHC: 31 g/dL (ref 30.0–36.0)
MCV: 85.2 fL (ref 80.0–100.0)
Monocytes Absolute: 0.8 K/uL (ref 0.1–1.0)
Monocytes Relative: 6 %
Neutro Abs: 11.7 K/uL — ABNORMAL HIGH (ref 1.7–7.7)
Neutrophils Relative %: 83 %
Platelets: 264 K/uL (ref 150–400)
RBC: 4.92 MIL/uL (ref 3.87–5.11)
RDW: 13.1 % (ref 11.5–15.5)
WBC: 14.1 K/uL — ABNORMAL HIGH (ref 4.0–10.5)
nRBC: 0 % (ref 0.0–0.2)

## 2024-02-22 LAB — COMPREHENSIVE METABOLIC PANEL WITH GFR
ALT: 31 U/L (ref 0–44)
AST: 33 U/L (ref 15–41)
Albumin: 3.9 g/dL (ref 3.5–5.0)
Alkaline Phosphatase: 63 U/L (ref 38–126)
Anion gap: 9 (ref 5–15)
BUN: 8 mg/dL (ref 6–20)
CO2: 28 mmol/L (ref 22–32)
Calcium: 8.5 mg/dL — ABNORMAL LOW (ref 8.9–10.3)
Chloride: 102 mmol/L (ref 98–111)
Creatinine, Ser: 0.81 mg/dL (ref 0.44–1.00)
GFR, Estimated: >60 mL/min
Glucose, Bld: 106 mg/dL — ABNORMAL HIGH (ref 70–99)
Potassium: 4.6 mmol/L (ref 3.5–5.1)
Sodium: 139 mmol/L (ref 135–145)
Total Bilirubin: 0.3 mg/dL (ref 0.0–1.2)
Total Protein: 6.7 g/dL (ref 6.5–8.1)

## 2024-02-22 LAB — URINALYSIS, ROUTINE W REFLEX MICROSCOPIC
Bilirubin Urine: NEGATIVE
Glucose, UA: NEGATIVE mg/dL
Hgb urine dipstick: NEGATIVE
Ketones, ur: NEGATIVE mg/dL
Leukocytes,Ua: NEGATIVE
Nitrite: NEGATIVE
Protein, ur: NEGATIVE mg/dL
Specific Gravity, Urine: 1.025 (ref 1.005–1.030)
pH: 5.5 (ref 5.0–8.0)

## 2024-02-22 LAB — LIPASE, BLOOD: Lipase: 26 U/L (ref 11–51)

## 2024-02-22 LAB — PREGNANCY, URINE: Preg Test, Ur: NEGATIVE

## 2024-02-22 MED ORDER — SODIUM CHLORIDE 0.9 % IV BOLUS
1000.0000 mL | Freq: Once | INTRAVENOUS | Status: AC
  Administered 2024-02-22: 1000 mL via INTRAVENOUS

## 2024-02-22 MED ORDER — ONDANSETRON HCL 4 MG/2ML IJ SOLN
4.0000 mg | Freq: Once | INTRAMUSCULAR | Status: AC
  Administered 2024-02-22: 4 mg via INTRAVENOUS
  Filled 2024-02-22: qty 2

## 2024-02-22 MED ORDER — ONDANSETRON 4 MG PO TBDP: 4.0000 mg | ORAL_TABLET | Freq: Three times a day (TID) | ORAL | 0 refills | Status: AC | PRN

## 2024-02-22 NOTE — Discharge Instructions (Signed)
 You are seen in the emergency department today for concerns of vomiting.  Your labs are reassuring without any significant dehydration.  You  responded well to Zofran  for nausea.  I would recommend continuing to focus on hydration over the next several days.  Avoid marijuana use going forward.  For any concerns of new or worsening symptoms, return to the emergency department.

## 2024-02-22 NOTE — ED Provider Notes (Signed)
 " [place] [facility] Provider Note   CSN: [id number] Arrival date & time: 02/22/24  1328     Patient presents with: Emesis   Anita Hunter is a 34 y.o. female.  Patient with past history significant for obesity, type 2 diabetes, anemia presents to the emergency department with concerns of vomiting.  Reports that she has noticed over the last 3 weeks episodic periods where she begins to experience nausea and vomiting over the weekend after marijuana consumption.  She states that during the week she is fine.  She does also note that she is supposed to be using Ozempic weekly with Wednesday injections but had fallen off her schedule for several weeks but has not restarted.  Unsure if her symptoms may be due to her medication.  She denies any fever, chills or bodyaches.  Has had some diarrhea with these episodes.   Emesis      Prior to Admission medications  Medication Sig Start Date End Date Taking? Authorizing Provider  ondansetron  (ZOFRAN -ODT) 4 MG disintegrating tablet Take 1 tablet (4 mg total) by mouth every 8 (eight) hours as needed for nausea or vomiting. 02/22/24  Yes Avamae Dehaan A, PA-C  acetaminophen  (TYLENOL ) 325 MG tablet Take 2 tablets (650 mg total) by mouth every 6 (six) hours as needed for headache, mild pain or fever (fever > or = 101). Patient not taking: Reported on 02/16/2022 03/03/19   Angiulli, Daniel J, PA-C  amoxicillin -clavulanate (AUGMENTIN ) 875-125 MG tablet Take 1 tablet by mouth every 12 (twelve) hours. Patient not taking: Reported on 02/16/2022 12/19/21   Harris, Abigail, PA-C  benzonatate  (TESSALON ) 100 MG capsule Take 1 capsule (100 mg total) by mouth 3 (three) times daily as needed for cough. Patient not taking: Reported on 02/16/2022 11/09/21   Gladis Elsie JAYSON, PA-C  fexofenadine  (ALLEGRA ) 30 MG/5ML suspension Take 5 mLs (30 mg total) by mouth daily. Patient not taking: Reported on 02/16/2022 06/23/20   Lenon Marien CROME,  MD  fluticasone  (FLONASE ) 50 MCG/ACT nasal spray Place 2 sprays into both nostrils daily. Patient not taking: Reported on 02/16/2022 06/23/20   Lenon Marien CROME, MD  methocarbamol  (ROBAXIN ) 500 MG tablet Take 1 tablet (500 mg total) by mouth 2 (two) times daily. Patient taking differently: Take 500 mg by mouth as needed for muscle spasms. 09/28/21   Logan Ubaldo NOVAK, PA-C  methylPREDNISolone  (MEDROL  DOSEPAK) 4 MG TBPK tablet Take by mouth. Follow package insert 02/16/22   Curatolo, Adam, DO  oxyCODONE  (ROXICODONE ) 5 MG immediate release tablet Take 1 tablet (5 mg total) by mouth every 6 (six) hours as needed for up to 10 doses for breakthrough pain. 02/16/22   Curatolo, Adam, DO  triamcinolone  ointment (KENALOG ) 0.5 % Apply 1 application topically 2 (two) times daily. Patient not taking: Reported on 02/16/2022 05/26/19   Genette Cadet, MD  trimethoprim -polymyxin b  (POLYTRIM ) ophthalmic solution Place 1 drop into the right eye every 4 (four) hours for 5-7 days. Patient not taking: Reported on 02/16/2022 12/19/21   Harris, Abigail, PA-C    Allergies: Tramadol  and Shellfish allergy    Review of Systems  Gastrointestinal:  Positive for vomiting.  All other systems reviewed and are negative.   Updated Vital Signs BP 122/70 (BP Location: Left Arm)   Pulse 93   Temp 99.1 F (37.3 C) (Oral)   Resp 19   Ht 5' 8 (1.727 m)   Wt (!) 174.2 kg   LMP 01/11/2024 (Approximate)   SpO2 96%  BMI 58.39 kg/m   Physical Exam Vitals and nursing note reviewed.  Constitutional:      General: She is not in acute distress.    Appearance: She is well-developed.  HENT:     Head: Normocephalic and atraumatic.  Eyes:     Conjunctiva/sclera: Conjunctivae normal.  Cardiovascular:     Rate and Rhythm: Normal rate and regular rhythm.     Heart sounds: No murmur heard. Pulmonary:     Effort: Pulmonary effort is normal. No respiratory distress.     Breath sounds: Normal breath sounds. No wheezing or rales.   Abdominal:     General: Abdomen is flat. Bowel sounds are normal. There is no distension.     Palpations: Abdomen is soft.     Tenderness: There is no abdominal tenderness. There is no guarding.  Musculoskeletal:        General: No swelling.     Cervical back: Neck supple.  Skin:    General: Skin is warm and dry.     Capillary Refill: Capillary refill takes less than 2 seconds.  Neurological:     Mental Status: She is alert.  Psychiatric:        Mood and Affect: Mood normal.     (all labs ordered are listed, but only abnormal results are displayed) Labs Reviewed  CBC WITH DIFFERENTIAL/PLATELET - Abnormal; Notable for the following components:      Result Value   WBC 14.1 (*)    Neutro Abs 11.7 (*)    All other components within normal limits  COMPREHENSIVE METABOLIC PANEL WITH GFR - Abnormal; Notable for the following components:   Glucose, Bld 106 (*)    Calcium  8.5 (*)    All other components within normal limits  LIPASE, BLOOD  URINALYSIS, ROUTINE W REFLEX MICROSCOPIC  PREGNANCY, URINE    EKG: None  Radiology: No results found.   Procedures   Medications Ordered in the ED  sodium chloride  0.9 % bolus 1,000 mL (0 mLs Intravenous Stopped 02/22/24 1546)  ondansetron  (ZOFRAN ) injection 4 mg (4 mg Intravenous Given 02/22/24 1448)                                    Medical Decision Making Amount and/or Complexity of Data Reviewed Labs: ordered.  Risk Prescription drug management.   This patient presents to the ED for concern of vomiting, this involves an extensive number of treatment options, and is a complaint that carries with it a high risk of complications and morbidity.  The differential diagnosis includes gastritis, cyclic vomiting, Ozempic side effect, dehydration   Co morbidities that complicate the patient evaluation  Obesity, type 2 diabetes, acquired CHF   Additional history obtained:  Additional history obtained from chart review   Lab  Tests:  I Ordered, and personally interpreted labs.  The pertinent results include: CBC mild leukocytosis at 14.1, CMP unremarkable with only mild hypocalcemia at 8.5, lipase unremarkable at 26, UA negative for infection, urine pregnancy negative   Consultations Obtained:  I requested consultation with none,  and discussed lab and imaging findings as well as pertinent plan - they recommend: N/A   Problem List / ED Course / Critical interventions / Medication management  Patient presents to the emergency department concerns of nausea and vomiting.  Reports that she has been experiencing cycles of last several weeks of nausea and vomiting for the weekend with no symptoms during  the week.  She is unclear if this is due to her marijuana use or possibly from Ozempic.  She had been on Ozempic consistently and had missed several doses and has been restarting this as of the last few weeks and is unsure if this may be medication reaction.  She does also endorse some mild diarrhea but no other symptoms that she can note.  She states that the abdomen is generally nontender but will have some cramping prior to vomiting.  She last experienced vomiting about 2 hours ago and denies any significant nausea at this time. Exam is unremarkable with no significant tenderness or guarding seen on exam of the abdomen.  Vitals show mild tachycardia at 102.  Otherwise stable vitals.  Will initiate lab work, fluids, and Zofran .  Will reassess shortly. Workup shows mild leukocytosis at 14.1 with unremarkable CMP for any significant dehydration.  UA negative for infection, urine pregnancy negative.  Lipase unremarkable at 26. Patient tolerating PO after fluids and Zofran . Given no significant abdominal pain, she is otherwise stable for discharge as she is tolerating PO. Advised return precautions for any concerns for new or worsening symptoms. Discharged home in stable condition. I ordered medication including Zofran , fluids for  nausea, dehydration Reevaluation of the patient after these medicines showed that the patient improved I have reviewed the patients home medicines and have made adjustments as needed   Social Determinants of Health:  None   Test / Admission - Considered:  Stable for outpatient follow up.  Final diagnoses:  Nausea and vomiting, unspecified vomiting type    ED Discharge Orders          Ordered    ondansetron  (ZOFRAN -ODT) 4 MG disintegrating tablet  Every 8 hours PRN        02/22/24 1608               Jaramiah Bossard A, PA-C 02/22/24 1653  "

## 2024-02-22 NOTE — ED Triage Notes (Addendum)
 Pt states that she has been throwing up over the weekend for the past 3 weeks but not during the week. States that she has been smoking marijuana over the weekends. Smoked marijuana last night. N/v began 1030 this am. Also on Ozempic.

## 2024-02-22 NOTE — ED Notes (Signed)
 Given gingerale for PO fluid challenge. States feels better
